# Patient Record
Sex: Female | Born: 1940
Health system: Southern US, Community
[De-identification: ages and names within clinical notes are randomized; demographics above are authoritative.]

## PROBLEM LIST (undated history)

## (undated) DIAGNOSIS — C801 Malignant (primary) neoplasm, unspecified: Secondary | ICD-10-CM

## (undated) DIAGNOSIS — Z923 Personal history of irradiation: Secondary | ICD-10-CM

## (undated) DIAGNOSIS — D649 Anemia, unspecified: Secondary | ICD-10-CM

## (undated) DIAGNOSIS — J45909 Unspecified asthma, uncomplicated: Secondary | ICD-10-CM

## (undated) DIAGNOSIS — I10 Essential (primary) hypertension: Secondary | ICD-10-CM

## (undated) DIAGNOSIS — H409 Unspecified glaucoma: Secondary | ICD-10-CM

## (undated) DIAGNOSIS — M810 Age-related osteoporosis without current pathological fracture: Secondary | ICD-10-CM

## (undated) DIAGNOSIS — M1990 Unspecified osteoarthritis, unspecified site: Secondary | ICD-10-CM

## (undated) HISTORY — PX: NO PAST SURGERIES: SHX2092

## (undated) HISTORY — PX: GLAUCOMA SURGERY: SHX656

## (undated) HISTORY — DX: Anemia, unspecified: D64.9

## (undated) HISTORY — DX: Age-related osteoporosis without current pathological fracture: M81.0

## (undated) HISTORY — DX: Unspecified glaucoma: H40.9

---

## 2003-01-30 ENCOUNTER — Encounter: Admission: RE | Admit: 2003-01-30 | Discharge: 2003-04-30 | Payer: Self-pay | Admitting: Family Medicine

## 2004-09-09 ENCOUNTER — Ambulatory Visit: Payer: Self-pay | Admitting: Pulmonary Disease

## 2014-09-30 ENCOUNTER — Emergency Department (HOSPITAL_COMMUNITY): Payer: Medicare Other

## 2014-09-30 ENCOUNTER — Inpatient Hospital Stay (HOSPITAL_COMMUNITY)
Admission: EM | Admit: 2014-09-30 | Discharge: 2014-10-04 | DRG: 470 | Disposition: A | Discharge status: Skilled Nursing Facility | Payer: Medicare Other | Attending: Internal Medicine | Admitting: Internal Medicine

## 2014-09-30 ENCOUNTER — Encounter (HOSPITAL_COMMUNITY): Payer: Self-pay | Admitting: *Deleted

## 2014-09-30 DIAGNOSIS — Z79899 Other long term (current) drug therapy: Secondary | ICD-10-CM | POA: Diagnosis not present

## 2014-09-30 DIAGNOSIS — Z7901 Long term (current) use of anticoagulants: Secondary | ICD-10-CM

## 2014-09-30 DIAGNOSIS — Z880 Allergy status to penicillin: Secondary | ICD-10-CM

## 2014-09-30 DIAGNOSIS — Z9889 Other specified postprocedural states: Secondary | ICD-10-CM

## 2014-09-30 DIAGNOSIS — J45909 Unspecified asthma, uncomplicated: Secondary | ICD-10-CM | POA: Diagnosis present

## 2014-09-30 DIAGNOSIS — M069 Rheumatoid arthritis, unspecified: Secondary | ICD-10-CM | POA: Diagnosis present

## 2014-09-30 DIAGNOSIS — D649 Anemia, unspecified: Secondary | ICD-10-CM | POA: Diagnosis not present

## 2014-09-30 DIAGNOSIS — W010XXA Fall on same level from slipping, tripping and stumbling without subsequent striking against object, initial encounter: Secondary | ICD-10-CM | POA: Diagnosis present

## 2014-09-30 DIAGNOSIS — D62 Acute posthemorrhagic anemia: Secondary | ICD-10-CM | POA: Diagnosis not present

## 2014-09-30 DIAGNOSIS — Z7952 Long term (current) use of systemic steroids: Secondary | ICD-10-CM | POA: Diagnosis not present

## 2014-09-30 DIAGNOSIS — S72009A Fracture of unspecified part of neck of unspecified femur, initial encounter for closed fracture: Secondary | ICD-10-CM | POA: Diagnosis present

## 2014-09-30 DIAGNOSIS — S72012A Unspecified intracapsular fracture of left femur, initial encounter for closed fracture: Secondary | ICD-10-CM | POA: Diagnosis present

## 2014-09-30 DIAGNOSIS — M25552 Pain in left hip: Secondary | ICD-10-CM | POA: Diagnosis present

## 2014-09-30 DIAGNOSIS — S72002A Fracture of unspecified part of neck of left femur, initial encounter for closed fracture: Secondary | ICD-10-CM | POA: Diagnosis present

## 2014-09-30 DIAGNOSIS — S72012S Unspecified intracapsular fracture of left femur, sequela: Secondary | ICD-10-CM | POA: Diagnosis not present

## 2014-09-30 DIAGNOSIS — Z8249 Family history of ischemic heart disease and other diseases of the circulatory system: Secondary | ICD-10-CM | POA: Diagnosis not present

## 2014-09-30 DIAGNOSIS — D6489 Other specified anemias: Secondary | ICD-10-CM | POA: Diagnosis present

## 2014-09-30 DIAGNOSIS — S72002S Fracture of unspecified part of neck of left femur, sequela: Secondary | ICD-10-CM | POA: Diagnosis not present

## 2014-09-30 DIAGNOSIS — M0579 Rheumatoid arthritis with rheumatoid factor of multiple sites without organ or systems involvement: Secondary | ICD-10-CM | POA: Diagnosis present

## 2014-09-30 HISTORY — DX: Unspecified asthma, uncomplicated: J45.909

## 2014-09-30 HISTORY — DX: Unspecified osteoarthritis, unspecified site: M19.90

## 2014-09-30 LAB — URINALYSIS, ROUTINE W REFLEX MICROSCOPIC
BILIRUBIN URINE: NEGATIVE
Glucose, UA: NEGATIVE mg/dL
HGB URINE DIPSTICK: NEGATIVE
Ketones, ur: NEGATIVE mg/dL
NITRITE: NEGATIVE
Protein, ur: NEGATIVE mg/dL
Specific Gravity, Urine: 1.015 (ref 1.005–1.030)
Urobilinogen, UA: 1 mg/dL (ref 0.0–1.0)
pH: 7 (ref 5.0–8.0)

## 2014-09-30 LAB — I-STAT CHEM 8, ED
BUN: 17 mg/dL (ref 6–20)
Calcium, Ion: 1.16 mmol/L (ref 1.13–1.30)
Chloride: 96 mmol/L — ABNORMAL LOW (ref 101–111)
Creatinine, Ser: 0.7 mg/dL (ref 0.44–1.00)
Glucose, Bld: 108 mg/dL — ABNORMAL HIGH (ref 70–99)
HCT: 37 % (ref 36.0–46.0)
HEMOGLOBIN: 12.6 g/dL (ref 12.0–15.0)
POTASSIUM: 4.3 mmol/L (ref 3.5–5.1)
Sodium: 133 mmol/L — ABNORMAL LOW (ref 135–145)
TCO2: 28 mmol/L (ref 0–100)

## 2014-09-30 LAB — CBC WITH DIFFERENTIAL/PLATELET
BASOS PCT: 0 % (ref 0–1)
Basophils Absolute: 0 10*3/uL (ref 0.0–0.1)
EOS ABS: 0.2 10*3/uL (ref 0.0–0.7)
EOS PCT: 2 % (ref 0–5)
HCT: 34.6 % — ABNORMAL LOW (ref 36.0–46.0)
HEMOGLOBIN: 11.1 g/dL — AB (ref 12.0–15.0)
LYMPHS ABS: 1.7 10*3/uL (ref 0.7–4.0)
Lymphocytes Relative: 19 % (ref 12–46)
MCH: 27.1 pg (ref 26.0–34.0)
MCHC: 32.1 g/dL (ref 30.0–36.0)
MCV: 84.4 fL (ref 78.0–100.0)
Monocytes Absolute: 1.3 10*3/uL — ABNORMAL HIGH (ref 0.1–1.0)
Monocytes Relative: 15 % — ABNORMAL HIGH (ref 3–12)
Neutro Abs: 5.6 10*3/uL (ref 1.7–7.7)
Neutrophils Relative %: 64 % (ref 43–77)
Platelets: 332 10*3/uL (ref 150–400)
RBC: 4.1 MIL/uL (ref 3.87–5.11)
RDW: 14.6 % (ref 11.5–15.5)
WBC: 8.8 10*3/uL (ref 4.0–10.5)

## 2014-09-30 LAB — URINE MICROSCOPIC-ADD ON

## 2014-09-30 MED ORDER — ONDANSETRON HCL 4 MG/2ML IJ SOLN
4.0000 mg | Freq: Once | INTRAMUSCULAR | Status: AC
  Administered 2014-09-30: 4 mg via INTRAVENOUS
  Filled 2014-09-30: qty 2

## 2014-09-30 MED ORDER — MORPHINE SULFATE 4 MG/ML IJ SOLN
4.0000 mg | Freq: Once | INTRAMUSCULAR | Status: AC
  Administered 2014-09-30: 4 mg via INTRAVENOUS
  Filled 2014-09-30: qty 1

## 2014-09-30 MED ORDER — SODIUM CHLORIDE 0.9 % IV SOLN
20.0000 mL | INTRAVENOUS | Status: DC
  Administered 2014-09-30: 20 mL via INTRAVENOUS

## 2014-09-30 NOTE — ED Provider Notes (Signed)
CSN: [id number]     Arrival date & time 09/30/14  1714 History  This chart was scribed for Ripley Fraise, MD by Rayfield Citizen, ED Scribe. This patient was seen in room A09C/A09C and the patient's care was started at 10:59 PM.    Chief Complaint  Patient presents with  . hip fracture     x 1 month   Patient is a 74 y.o. female presenting with hip pain. The history is provided by the patient. No language interpreter was used.  Hip Pain This is a new problem. The current episode started more than 1 week ago. The problem occurs constantly. The problem has not changed since onset.Pertinent negatives include no chest pain and no abdominal pain. The symptoms are aggravated by walking, bending and standing (Use of left leg). Nothing relieves the symptoms. She has tried nothing for the symptoms. The treatment provided no relief.  HPI Comments: Felicia Gonzalez is a 74 y.o. female who presents to the Emergency Department complaining of 1 month of left hip pain after a mechanical fall. Patient was seen by her PCP 5 days after her fall and was told at that time that she was experiencing muscle pain; she was referred to physical therapy. Patient states that after her third visit, her PT suggested she needed an xray. She received an xray today and was sent here following a visit with Dr. Durward Fortes. Patient explains her pain is bearable but makes it difficult for her to sleep at night. She is unable to lift the left leg due to pain. She denies fevers, vomiting, chest pain, abdominal pain, back pain, ankle pain, new or unusual knee pain (arthritic pain at baseline). She denies prior surgical history with this hip.   PCP Dr. Harrington Challenger. She denies anticoagulant use.   Past Medical History  Diagnosis Date  . Arthritis     rheumatoid  . Asthma    History reviewed. No pertinent past surgical history. No family history on file. History  Substance Use Topics  . Smoking status: Never Smoker   . Smokeless tobacco: Not on  file  . Alcohol Use: No   OB History    No data available     Review of Systems  Constitutional: Negative for fever.  Cardiovascular: Negative for chest pain.  Gastrointestinal: Negative for vomiting and abdominal pain.  Musculoskeletal: Negative for back pain.       Left hip pain  All other systems reviewed and are negative.  Allergies  Penicillins  Home Medications   Prior to Admission medications   Not on File   BP 113/65 mmHg  Pulse 87  Temp(Src) 98.4 F (36.9 C) (Oral)  Resp 16  SpO2 100% Physical Exam  Nursing note and vitals reviewed.   CONSTITUTIONAL: Well developed/well nourished HEAD: Normocephalic/atraumatic EYES: EOMI/PERRL ENMT: Mucous membranes moist NECK: supple no meningeal signs SPINE/BACK:entire spine nontender CV: S1/S2 noted, no murmurs/rubs/gallops noted LUNGS: Lungs are clear to auscultation bilaterally, no apparent distress ABDOMEN: soft, nontender, no rebound or guarding, bowel sounds noted throughout abdomen GU:no cva tenderness NEURO: Pt is awake/alert/appropriate, moves all extremitiesx4.  No facial droop.   EXTREMITIES: pulses normal/equal, full ROM; tenderness with ROM of left hip SKIN: warm, color normal PSYCH: no abnormalities of mood noted, alert and oriented to situation  ED Course  Procedures   DIAGNOSTIC STUDIES: Oxygen Saturation is 100% on RA, normal by my interpretation.    COORDINATION OF CARE: 11:03 PM Discussed treatment plan with pt at bedside and pt agreed  to plan. 11:25 PM D/w on call for [facility] He is aware of pt Dr Durward Fortes plans for OR tomorrow Will call [facility] for admission Left hip film from outside clinic reviewed, fracture noted 11:37 PM D/w dr Hal Hope, will admit  Labs Review Labs Reviewed  URINALYSIS, ROUTINE W REFLEX MICROSCOPIC - Abnormal; Notable for the following:    Leukocytes, UA TRACE (*)    All other components within normal limits  URINE MICROSCOPIC-ADD ON - Abnormal; Notable  for the following:    Casts HYALINE CASTS (*)    Crystals CA OXALATE CRYSTALS (*)    All other components within normal limits  CBC WITH DIFFERENTIAL/PLATELET - Abnormal; Notable for the following:    Hemoglobin 11.1 (*)    HCT 34.6 (*)    Monocytes Relative 15 (*)    Monocytes Absolute 1.3 (*)    All other components within normal limits  I-STAT CHEM 8, ED - Abnormal; Notable for the following:    Sodium 133 (*)    Chloride 96 (*)    Glucose, Bld 108 (*)    All other components within normal limits  BASIC METABOLIC PANEL  PROTIME-INR  TYPE AND SCREEN    Imaging Review Dg Chest Port 1 View  09/30/2014   CLINICAL DATA:  Hip fracture  EXAM: PORTABLE CHEST - 1 VIEW  COMPARISON:  None.  FINDINGS: Normal mediastinum and cardiac silhouette. Normal pulmonary vasculature. No evidence of effusion, infiltrate, or pneumothorax. No acute bony abnormality. There is a rounded sclerotic density measuring 3.4 by 2.1 cm projecting over the right first rib. This is not appear to be in the lung parenchyma and may be associated with a rib 4 of the skin.  IMPRESSION: No acute cardiopulmonary process.  Sclerotic lesion projecting over the right lung apex is indeterminate. This may associated with the bone and would potentially be palpable.   Electronically Signed   By: Suzy Bouchard M.D.   On: 09/30/2014 23:47     EKG Interpretation   Date/Time:  Monday Sep 30 2014 23:19:05 EDT Ventricular Rate:  94 PR Interval:  163 QRS Duration: 81 QT Interval:  331 QTC Calculation: 414 R Axis:   58 Text Interpretation:  Sinus rhythm Atrial premature complex Probable  anteroseptal infarct, old artifact noted Confirmed by Christy Gentles  MD, Jacarius Handel  [phone]) on 09/30/2014 11:25:42 PM     Medications  morphine 4 MG/ML injection 4 mg (not administered)  ondansetron (ZOFRAN) injection 4 mg (not administered)  0.9 %  sodium chloride infusion (not administered)    MDM   Final diagnoses:  Closed left hip fracture,  initial encounter    Nursing notes including past medical history and social history reviewed and considered in documentation xrays/imaging reviewed by myself and considered during evaluation Labs/vital reviewed myself and considered during evaluation    I personally performed the services described in this documentation, which was scribed in my presence. The recorded information has been reviewed and is accurate.      Ripley Fraise, MD 09/30/14 2356

## 2014-09-30 NOTE — ED Notes (Signed)
Pt sent here by Dr Donne Hazel for hip fracture x 1 month.  States she tripped and fell 1 month prior.  Went to an MD who told her she had muscle pain and referred her to a physical therapy.  After 3rd visit, PT said pt needed an X-ray.  Pt's pcp sent her to an x-ray today which showed L hip fracture, displaced.  Dr Donne Hazel, orthopedics, sent pt here to be admitted.

## 2014-10-01 ENCOUNTER — Encounter (HOSPITAL_COMMUNITY)
Admission: EM | Disposition: A | Discharge status: Skilled Nursing Facility | Payer: Self-pay | Source: Home / Self Care | Attending: Family Medicine

## 2014-10-01 ENCOUNTER — Inpatient Hospital Stay (HOSPITAL_COMMUNITY): Payer: Medicare Other | Admitting: Certified Registered Nurse Anesthetist

## 2014-10-01 ENCOUNTER — Inpatient Hospital Stay (HOSPITAL_COMMUNITY): Payer: Medicare Other

## 2014-10-01 ENCOUNTER — Encounter (HOSPITAL_COMMUNITY): Payer: Self-pay | Admitting: Internal Medicine

## 2014-10-01 DIAGNOSIS — S72012A Unspecified intracapsular fracture of left femur, initial encounter for closed fracture: Secondary | ICD-10-CM | POA: Diagnosis present

## 2014-10-01 DIAGNOSIS — M069 Rheumatoid arthritis, unspecified: Secondary | ICD-10-CM

## 2014-10-01 DIAGNOSIS — D649 Anemia, unspecified: Secondary | ICD-10-CM | POA: Diagnosis present

## 2014-10-01 DIAGNOSIS — S72002A Fracture of unspecified part of neck of left femur, initial encounter for closed fracture: Secondary | ICD-10-CM

## 2014-10-01 DIAGNOSIS — M0579 Rheumatoid arthritis with rheumatoid factor of multiple sites without organ or systems involvement: Secondary | ICD-10-CM | POA: Diagnosis present

## 2014-10-01 DIAGNOSIS — S72009A Fracture of unspecified part of neck of unspecified femur, initial encounter for closed fracture: Secondary | ICD-10-CM | POA: Diagnosis present

## 2014-10-01 HISTORY — PX: TOTAL HIP ARTHROPLASTY: SHX124

## 2014-10-01 LAB — ABO/RH: ABO/RH(D): B NEG

## 2014-10-01 LAB — CBC
HCT: 32.6 % — ABNORMAL LOW (ref 36.0–46.0)
HEMOGLOBIN: 10.3 g/dL — AB (ref 12.0–15.0)
MCH: 26.3 pg (ref 26.0–34.0)
MCHC: 31.6 g/dL (ref 30.0–36.0)
MCV: 83.4 fL (ref 78.0–100.0)
Platelets: 336 10*3/uL (ref 150–400)
RBC: 3.91 MIL/uL (ref 3.87–5.11)
RDW: 14.6 % (ref 11.5–15.5)
WBC: 6.5 10*3/uL (ref 4.0–10.5)

## 2014-10-01 LAB — BASIC METABOLIC PANEL
Anion gap: 10 (ref 5–15)
BUN: 13 mg/dL (ref 6–20)
CHLORIDE: 97 mmol/L — AB (ref 101–111)
CO2: 27 mmol/L (ref 22–32)
Calcium: 9.1 mg/dL (ref 8.9–10.3)
Creatinine, Ser: 0.67 mg/dL (ref 0.44–1.00)
GFR calc non Af Amer: >60 mL/min (ref >60)
Glucose, Bld: 108 mg/dL — ABNORMAL HIGH (ref 70–99)
POTASSIUM: 4.3 mmol/L (ref 3.5–5.1)
Sodium: 134 mmol/L — ABNORMAL LOW (ref 135–145)

## 2014-10-01 LAB — COMPREHENSIVE METABOLIC PANEL
ALBUMIN: 2.5 g/dL — AB (ref 3.5–5.0)
ALK PHOS: 133 U/L — AB (ref 38–126)
ALT: 21 U/L (ref 14–54)
ANION GAP: 10 (ref 5–15)
AST: 18 U/L (ref 15–41)
BILIRUBIN TOTAL: 0.4 mg/dL (ref 0.3–1.2)
BUN: 8 mg/dL (ref 6–20)
CO2: 26 mmol/L (ref 22–32)
Calcium: 8.9 mg/dL (ref 8.9–10.3)
Chloride: 101 mmol/L (ref 101–111)
Creatinine, Ser: 0.62 mg/dL (ref 0.44–1.00)
GFR calc non Af Amer: >60 mL/min (ref >60)
Glucose, Bld: 114 mg/dL — ABNORMAL HIGH (ref 70–99)
POTASSIUM: 4.2 mmol/L (ref 3.5–5.1)
SODIUM: 137 mmol/L (ref 135–145)
TOTAL PROTEIN: 6 g/dL — AB (ref 6.5–8.1)

## 2014-10-01 LAB — PREPARE RBC (CROSSMATCH)

## 2014-10-01 LAB — PROTIME-INR
INR: 1 (ref 0.00–1.49)
Prothrombin Time: 13.3 seconds (ref 11.6–15.2)

## 2014-10-01 LAB — SURGICAL PCR SCREEN
MRSA, PCR: NEGATIVE
Staphylococcus aureus: NEGATIVE

## 2014-10-01 LAB — GLUCOSE, CAPILLARY: Glucose-Capillary: 106 mg/dL — ABNORMAL HIGH (ref 70–99)

## 2014-10-01 SURGERY — ARTHROPLASTY, HIP, TOTAL,POSTERIOR APPROACH
Anesthesia: General | Laterality: Left

## 2014-10-01 MED ORDER — HYDROCORTISONE NA SUCCINATE PF 100 MG IJ SOLR
INTRAMUSCULAR | Status: DC | PRN
  Administered 2014-10-01: 100 mg via INTRAVENOUS

## 2014-10-01 MED ORDER — FENTANYL CITRATE (PF) 250 MCG/5ML IJ SOLN
INTRAMUSCULAR | Status: AC
  Filled 2014-10-01: qty 5

## 2014-10-01 MED ORDER — ALUM & MAG HYDROXIDE-SIMETH 200-200-20 MG/5ML PO SUSP: 30.0000 mL | ORAL | Status: DC | PRN

## 2014-10-01 MED ORDER — DOCUSATE SODIUM 100 MG PO CAPS
100.0000 mg | ORAL_CAPSULE | Freq: Two times a day (BID) | ORAL | Status: DC
  Administered 2014-10-01 – 2014-10-04 (×6): 100 mg via ORAL
  Filled 2014-10-01 (×6): qty 1

## 2014-10-01 MED ORDER — MIDAZOLAM HCL 5 MG/5ML IJ SOLN
INTRAMUSCULAR | Status: DC | PRN
  Administered 2014-10-01: 2 mg via INTRAVENOUS

## 2014-10-01 MED ORDER — MIDAZOLAM HCL 2 MG/2ML IJ SOLN: 0.5000 mg | Freq: Once | INTRAMUSCULAR | Status: DC | PRN

## 2014-10-01 MED ORDER — HYDROCORTISONE NA SUCCINATE PF 100 MG IJ SOLR
INTRAMUSCULAR | Status: AC
  Filled 2014-10-01: qty 2

## 2014-10-01 MED ORDER — HYDROCORTISONE NA SUCCINATE PF 100 MG IJ SOLR
50.0000 mg | Freq: Four times a day (QID) | INTRAMUSCULAR | Status: AC
  Administered 2014-10-01 – 2014-10-02 (×2): 50 mg via INTRAVENOUS
  Filled 2014-10-01 (×2): qty 1

## 2014-10-01 MED ORDER — ACETAMINOPHEN 10 MG/ML IV SOLN
1000.0000 mg | Freq: Four times a day (QID) | INTRAVENOUS | Status: AC
  Administered 2014-10-01 – 2014-10-02 (×4): 1000 mg via INTRAVENOUS
  Filled 2014-10-01 (×4): qty 100

## 2014-10-01 MED ORDER — BUPIVACAINE-EPINEPHRINE (PF) 0.25% -1:200000 IJ SOLN
INTRAMUSCULAR | Status: AC
  Filled 2014-10-01: qty 30

## 2014-10-01 MED ORDER — MENTHOL 3 MG MT LOZG: 1.0000 | LOZENGE | OROMUCOSAL | Status: DC | PRN

## 2014-10-01 MED ORDER — NEOSTIGMINE METHYLSULFATE 10 MG/10ML IV SOLN
INTRAVENOUS | Status: DC | PRN
  Administered 2014-10-01: 4 mg via INTRAVENOUS

## 2014-10-01 MED ORDER — LACTATED RINGERS IV SOLN
INTRAVENOUS | Status: DC | PRN
  Administered 2014-10-01: 18:00:00 via INTRAVENOUS

## 2014-10-01 MED ORDER — HYDROCODONE-ACETAMINOPHEN 5-325 MG PO TABS
1.0000 | ORAL_TABLET | Freq: Four times a day (QID) | ORAL | Status: DC | PRN
  Administered 2014-10-01: 2 via ORAL
  Filled 2014-10-01 (×4): qty 2

## 2014-10-01 MED ORDER — MEPERIDINE HCL 25 MG/ML IJ SOLN: 6.2500 mg | INTRAMUSCULAR | Status: DC | PRN

## 2014-10-01 MED ORDER — GLYCOPYRROLATE 0.2 MG/ML IJ SOLN
INTRAMUSCULAR | Status: DC | PRN
  Administered 2014-10-01: .6 mg via INTRAVENOUS

## 2014-10-01 MED ORDER — HYDROCORTISONE NA SUCCINATE PF 100 MG IJ SOLR
50.0000 mg | Freq: Three times a day (TID) | INTRAMUSCULAR | Status: DC
  Administered 2014-10-01 (×2): 50 mg via INTRAVENOUS
  Filled 2014-10-01 (×5): qty 1

## 2014-10-01 MED ORDER — HYDROMORPHONE HCL 1 MG/ML IJ SOLN
INTRAMUSCULAR | Status: AC
  Filled 2014-10-01: qty 1

## 2014-10-01 MED ORDER — BISACODYL 10 MG RE SUPP: 10.0000 mg | Freq: Every day | RECTAL | Status: DC | PRN

## 2014-10-01 MED ORDER — RIVAROXABAN 10 MG PO TABS
10.0000 mg | ORAL_TABLET | Freq: Every day | ORAL | Status: DC
  Administered 2014-10-02 – 2014-10-04 (×3): 10 mg via ORAL
  Filled 2014-10-01 (×3): qty 1

## 2014-10-01 MED ORDER — ALBUMIN HUMAN 5 % IV SOLN
INTRAVENOUS | Status: DC | PRN
Start: 2014-10-01 — End: 2014-10-01
  Administered 2014-10-01: 18:00:00 via INTRAVENOUS

## 2014-10-01 MED ORDER — ONDANSETRON HCL 4 MG PO TABS: 4.0000 mg | ORAL_TABLET | Freq: Four times a day (QID) | ORAL | Status: DC | PRN

## 2014-10-01 MED ORDER — SODIUM CHLORIDE 0.9 % IV SOLN
INTRAVENOUS | Status: DC
  Administered 2014-10-01: 16:00:00 via INTRAVENOUS
  Administered 2014-10-01: 1000 mL via INTRAVENOUS

## 2014-10-01 MED ORDER — HYDROMORPHONE HCL 1 MG/ML IJ SOLN
0.2500 mg | INTRAMUSCULAR | Status: DC | PRN
  Administered 2014-10-03 (×2): 0.5 mg via INTRAVENOUS
  Filled 2014-10-01 (×2): qty 1

## 2014-10-01 MED ORDER — HYDROMORPHONE HCL 1 MG/ML IJ SOLN
0.2500 mg | INTRAMUSCULAR | Status: DC | PRN
  Administered 2014-10-01 (×2): 0.5 mg via INTRAVENOUS
  Administered 2014-10-01 (×2): 0.25 mg via INTRAVENOUS

## 2014-10-01 MED ORDER — HYDROCODONE-ACETAMINOPHEN 5-325 MG PO TABS
1.0000 | ORAL_TABLET | ORAL | Status: DC | PRN
  Administered 2014-10-02 (×2): 2 via ORAL
  Administered 2014-10-03: 1 via ORAL
  Administered 2014-10-03: 2 via ORAL
  Filled 2014-10-01 (×2): qty 2

## 2014-10-01 MED ORDER — DIPHENHYDRAMINE HCL 12.5 MG/5ML PO ELIX: 12.5000 mg | ORAL_SOLUTION | ORAL | Status: DC | PRN

## 2014-10-01 MED ORDER — ROCURONIUM BROMIDE 100 MG/10ML IV SOLN
INTRAVENOUS | Status: DC | PRN
  Administered 2014-10-01: 40 mg via INTRAVENOUS

## 2014-10-01 MED ORDER — MORPHINE SULFATE 2 MG/ML IJ SOLN: 0.5000 mg | INTRAMUSCULAR | Status: DC | PRN

## 2014-10-01 MED ORDER — POLYETHYLENE GLYCOL 3350 17 G PO PACK
17.0000 g | PACK | Freq: Every day | ORAL | Status: DC | PRN
  Filled 2014-10-01: qty 1

## 2014-10-01 MED ORDER — PHENOL 1.4 % MT LIQD: 1.0000 | OROMUCOSAL | Status: DC | PRN

## 2014-10-01 MED ORDER — PROPOFOL 10 MG/ML IV BOLUS
INTRAVENOUS | Status: DC | PRN
  Administered 2014-10-01: 80 mg via INTRAVENOUS

## 2014-10-01 MED ORDER — PROMETHAZINE HCL 25 MG/ML IJ SOLN: 6.2500 mg | INTRAMUSCULAR | Status: DC | PRN

## 2014-10-01 MED ORDER — CEFAZOLIN SODIUM-DEXTROSE 2-3 GM-% IV SOLR: 2.0000 g | INTRAVENOUS | Status: DC

## 2014-10-01 MED ORDER — ONDANSETRON HCL 4 MG/2ML IJ SOLN
INTRAMUSCULAR | Status: DC | PRN
  Administered 2014-10-01: 4 mg via INTRAVENOUS

## 2014-10-01 MED ORDER — LACTATED RINGERS IV SOLN: INTRAVENOUS | Status: DC

## 2014-10-01 MED ORDER — CHLORHEXIDINE GLUCONATE 4 % EX LIQD
60.0000 mL | Freq: Once | CUTANEOUS | Status: DC
  Filled 2014-10-01: qty 60

## 2014-10-01 MED ORDER — ONDANSETRON HCL 4 MG/2ML IJ SOLN
4.0000 mg | Freq: Four times a day (QID) | INTRAMUSCULAR | Status: DC | PRN
Start: 2014-10-01 — End: 2014-10-04

## 2014-10-01 MED ORDER — MAGNESIUM CITRATE PO SOLN: 1.0000 | Freq: Once | ORAL | Status: AC | PRN

## 2014-10-01 MED ORDER — SODIUM CHLORIDE 0.9 % IV SOLN: Freq: Once | INTRAVENOUS | Status: DC

## 2014-10-01 MED ORDER — METOCLOPRAMIDE HCL 5 MG PO TABS: 5.0000 mg | ORAL_TABLET | Freq: Three times a day (TID) | ORAL | Status: DC | PRN

## 2014-10-01 MED ORDER — METOCLOPRAMIDE HCL 5 MG/ML IJ SOLN: 5.0000 mg | Freq: Three times a day (TID) | INTRAMUSCULAR | Status: DC | PRN

## 2014-10-01 MED ORDER — VANCOMYCIN HCL 1000 MG IV SOLR
1000.0000 mg | INTRAVENOUS | Status: DC | PRN
  Administered 2014-10-01: 1000 mg via INTRAVENOUS

## 2014-10-01 MED ORDER — FENTANYL CITRATE (PF) 100 MCG/2ML IJ SOLN
INTRAMUSCULAR | Status: DC | PRN
  Administered 2014-10-01: 50 ug via INTRAVENOUS
  Administered 2014-10-01: 100 ug via INTRAVENOUS
  Administered 2014-10-01 (×6): 50 ug via INTRAVENOUS

## 2014-10-01 MED ORDER — VANCOMYCIN HCL IN DEXTROSE 1-5 GM/200ML-% IV SOLN
1000.0000 mg | Freq: Once | INTRAVENOUS | Status: DC
  Filled 2014-10-01 (×2): qty 200

## 2014-10-01 MED ORDER — CEFAZOLIN SODIUM-DEXTROSE 2-3 GM-% IV SOLR
INTRAVENOUS | Status: AC
  Filled 2014-10-01: qty 50

## 2014-10-01 MED ORDER — LIDOCAINE HCL (CARDIAC) 20 MG/ML IV SOLN
INTRAVENOUS | Status: DC | PRN
  Administered 2014-10-01: 20 mg via INTRAVENOUS

## 2014-10-01 MED ORDER — BUPIVACAINE-EPINEPHRINE (PF) 0.25% -1:200000 IJ SOLN
INTRAMUSCULAR | Status: DC | PRN
  Administered 2014-10-01: 20 mL via PERINEURAL

## 2014-10-01 MED ORDER — PHENYLEPHRINE HCL 10 MG/ML IJ SOLN
INTRAMUSCULAR | Status: DC | PRN
  Administered 2014-10-01 (×4): 80 ug via INTRAVENOUS

## 2014-10-01 MED ORDER — SODIUM CHLORIDE 0.9 % IR SOLN
Status: DC | PRN
  Administered 2014-10-01 (×2): 1000 mL

## 2014-10-01 SURGICAL SUPPLY — 55 items
BLADE SAW SAG 73X25 THK (BLADE) ×1
BLADE SAW SGTL 73X25 THK (BLADE) ×1 IMPLANT
CAPT HIP TOTAL 2 ×2 IMPLANT
COVER SURGICAL LIGHT HANDLE (MISCELLANEOUS) ×2 IMPLANT
DECANTER SPIKE VIAL GLASS SM (MISCELLANEOUS) ×2 IMPLANT
DRAPE ORTHO SPLIT 77X108 STRL (DRAPES) ×2
DRAPE SURG ORHT 6 SPLT 77X108 (DRAPES) ×2 IMPLANT
DRSG MEPILEX BORDER 4X12 (GAUZE/BANDAGES/DRESSINGS) ×2 IMPLANT
DRSG MEPILEX BORDER 4X8 (GAUZE/BANDAGES/DRESSINGS) ×2 IMPLANT
DURAPREP 26ML APPLICATOR (WOUND CARE) ×4 IMPLANT
ELECT BLADE 6.5 EXT (BLADE) IMPLANT
ELECT REM PT RETURN 9FT ADLT (ELECTROSURGICAL) ×2
ELECTRODE REM PT RTRN 9FT ADLT (ELECTROSURGICAL) ×1 IMPLANT
EVACUATOR 1/8 PVC DRAIN (DRAIN) IMPLANT
FACESHIELD WRAPAROUND (MASK) ×6 IMPLANT
GLOVE BIO SURGEON STRL SZ 6.5 (GLOVE) ×2 IMPLANT
GLOVE BIOGEL PI IND STRL 7.0 (GLOVE) ×3 IMPLANT
GLOVE BIOGEL PI IND STRL 8 (GLOVE) ×2 IMPLANT
GLOVE BIOGEL PI IND STRL 8.5 (GLOVE) ×1 IMPLANT
GLOVE BIOGEL PI INDICATOR 7.0 (GLOVE) ×3
GLOVE BIOGEL PI INDICATOR 8 (GLOVE) ×2
GLOVE BIOGEL PI INDICATOR 8.5 (GLOVE) ×1
GLOVE ECLIPSE 8.0 STRL XLNG CF (GLOVE) ×4 IMPLANT
GLOVE SURG ORTHO 8.5 STRL (GLOVE) ×4 IMPLANT
GOWN STRL REUS W/ TWL LRG LVL3 (GOWN DISPOSABLE) ×2 IMPLANT
GOWN STRL REUS W/TWL 2XL LVL3 (GOWN DISPOSABLE) ×4 IMPLANT
GOWN STRL REUS W/TWL LRG LVL3 (GOWN DISPOSABLE) ×2
IMMOBILIZER KNEE 20 (SOFTGOODS) IMPLANT
IMMOBILIZER KNEE 22 (SOFTGOODS) ×2 IMPLANT
IMMOBILIZER KNEE 22 UNIV (SOFTGOODS) ×2 IMPLANT
KIT BASIN OR (CUSTOM PROCEDURE TRAY) ×2 IMPLANT
KIT ROOM TURNOVER OR (KITS) ×2 IMPLANT
MANIFOLD NEPTUNE II (INSTRUMENTS) ×6 IMPLANT
NEEDLE 22X1 1/2 (OR ONLY) (NEEDLE) ×2 IMPLANT
NS IRRIG 1000ML POUR BTL (IV SOLUTION) ×2 IMPLANT
PACK TOTAL JOINT (CUSTOM PROCEDURE TRAY) ×2 IMPLANT
PACK UNIVERSAL I (CUSTOM PROCEDURE TRAY) ×2 IMPLANT
PAD ARMBOARD 7.5X6 YLW CONV (MISCELLANEOUS) ×2 IMPLANT
STAPLER VISISTAT 35W (STAPLE) IMPLANT
SUCTION FRAZIER TIP 10 FR DISP (SUCTIONS) ×2 IMPLANT
SUT BONE WAX W31G (SUTURE) IMPLANT
SUT ETHIBOND NAB CT1 #1 30IN (SUTURE) ×6 IMPLANT
SUT MNCRL AB 3-0 PS2 18 (SUTURE) ×2 IMPLANT
SUT VIC AB 0 CT1 27 (SUTURE) ×2
SUT VIC AB 0 CT1 27XBRD ANBCTR (SUTURE) ×2 IMPLANT
SUT VIC AB 1 CT1 27 (SUTURE) ×2
SUT VIC AB 1 CT1 27XBRD ANBCTR (SUTURE) ×2 IMPLANT
SUT VIC AB 2-0 CT1 27 (SUTURE) ×1
SUT VIC AB 2-0 CT1 TAPERPNT 27 (SUTURE) ×1 IMPLANT
SYR CONTROL 10ML LL (SYRINGE) ×2 IMPLANT
TOWEL OR 17X24 6PK STRL BLUE (TOWEL DISPOSABLE) ×2 IMPLANT
TOWEL OR 17X26 10 PK STRL BLUE (TOWEL DISPOSABLE) ×2 IMPLANT
TRAY FOLEY CATH 14FR (SET/KITS/TRAYS/PACK) ×2 IMPLANT
WATER STERILE IRR 1000ML POUR (IV SOLUTION) ×8 IMPLANT
YANKAUER SUCT BULB TIP NO VENT (SUCTIONS) ×2 IMPLANT

## 2014-10-01 NOTE — Op Note (Signed)
PATIENT ID:      Felicia Gonzalez  MRN:     [id number] DOB/AGE:    1940-06-21 / 74 y.o.       OPERATIVE REPORT    DATE OF PROCEDURE:  10/01/2014       PREOPERATIVE DIAGNOSIS:   CHRONIC DISPLACED SUBCAPITAL FRACTURE LEFT HIP                                                       Estimated body mass index is 27.92 kg/(m^2) as calculated from the following:   Height as of this encounter: 5\' 1"  (1.549 m).   Weight as of this encounter: 67 kg (147 lb 11.3 oz).     POSTOPERATIVE DIAGNOSIS:   SAME                                                                   Estimated body mass index is 27.92 kg/(m^2) as calculated from the following:   Height as of this encounter: 5\' 1"  (1.549 m).   Weight as of this encounter: 67 kg (147 lb 11.3 oz).     PROCEDURE:  Procedure(s):LEFT TOTAL HIP ARTHROPLASTY      SURGEON:  Joni Fears, MD    ASSISTANT:   Biagio Borg, PA-C   (Present and scrubbed throughout the case, critical for assistance with exposure, retraction, instrumentation, and closure.)          ANESTHESIA: general     DRAINS: none :      TOURNIQUET TIME: * No tourniquets in log *    COMPLICATIONS:  None   CONDITION:  stable  PROCEDURE IN [id number]   Joni Fears W 10/01/2014, 6:31 PM

## 2014-10-01 NOTE — Progress Notes (Signed)
Patient seen earlier this a.m. by my associate. Presenting with left hip fracture and orthopedic surgery consulted.  Patient in no acute distress alert and awake.  We'll plan on reassessing next a.m. unless acute medical condition arises  Felicia Gonzalez, [employer]

## 2014-10-01 NOTE — Anesthesia Postprocedure Evaluation (Signed)
  Anesthesia Post-op Note  Patient: Felicia Gonzalez Interrante  Procedure(s) Performed: Procedure(s): TOTAL HIP ARTHROPLASTY (Left)  Patient Location: PACU  Anesthesia Type:General  Level of Consciousness: awake, alert  and sedated  Airway and Oxygen Therapy: Patient Spontanous Breathing  Post-op Pain: mild  Post-op Assessment: Post-op Vital signs reviewed  Post-op Vital Signs: stable  Last Vitals:  Filed Vitals:   10/01/14 2000  BP: 141/65  Pulse: 91  Temp:   Resp: 13    Complications: No apparent anesthesia complications

## 2014-10-01 NOTE — Progress Notes (Signed)
Initial Nutrition Assessment  DOCUMENTATION CODES:  Not applicable  INTERVENTION:  Other (Comment) (RD to follow for diet advancement)  NUTRITION DIAGNOSIS:  Inadequate oral intake related to inability to eat as evidenced by NPO status.   GOAL:  Patient will meet greater than or equal to 90% of their needs   MONITOR:  PO intake, Diet advancement, Labs, Weight trends, I & O's, Skin  REASON FOR ASSESSMENT:  Consult Hip fracture protocol  ASSESSMENT: Felicia Gonzalez is a 74 y.o. female history of rheumatoid arthritis was referred to the ER after patient was found to have left hip fracture by Dr. Durward Fortes orthopedic surgeon. Patient states she fell a month ago and since then he has been having pain on her left hip. This has been gradually worsening to the point she was unable to sleep. Today she had gone to her orthopedic surgeon and x-rays done at the office showed hip fracture and was referred to the ER. Patient has been admitted for surgery. Patient states she fell when she was trying to walk to the bathroom and slipped and fell. Did not hit her head or lose consciousness. At this time patient denies any chest pain or shortness of breath or palpitations.  Pt reports good appetite currently and PTA. She is anticipating surgery later on today.  She reports UBW of 130#. She reveals that she has gained wt due to not being as active as she should due to arthritic pain and injury. She is very active at baseline.  Pt reports she follows a vegetarian diet at home. Denies any changes in eating habits. Discussed examples of high protein foods for her to consume that are conducive to a vegetarian eating plan. Pt and family express appreciation for visit but deny any further needs at this time.  Labs reviewed. Glucose: 113, CBGS: 106.   Height:  Ht Readings from Last 1 Encounters:  10/01/14 5\' 1"  (1.549 m)    Weight:  Wt Readings from Last 1 Encounters:  10/01/14 147 lb 11.3 oz (67  kg)    Ideal Body Weight:  47.7 kg  Wt Readings from Last 10 Encounters:  10/01/14 147 lb 11.3 oz (67 kg)    BMI:  Body mass index is 27.92 kg/(m^2).  Estimated Nutritional Needs:  Kcal:  1650-1850  Protein:  70-80 grams  Fluid:  1.7-1.9 L  Skin:  Reviewed, no issues  Diet Order:  Diet NPO time specified  EDUCATION NEEDS:  Education needs addressed   Intake/Output Summary (Last 24 hours) at 10/01/14 1146 Last data filed at 10/01/14 0500  Gross per 24 hour  Intake      0 ml  Output      1 ml  Net     -1 ml    Last BM:  09/30/14  Felicia Gonzalez A. Jimmye Norman, RD, LDN, CDE Pager: [phone] After hours Pager: [phone]

## 2014-10-01 NOTE — H&P (Signed)
[facility] History and Physical  Felicia Gonzalez KDX:[id number] DOB: [birth year] DOA: 09/30/2014  Referring physician: Dr. Christy Gentles. PCP: Harle Battiest, MD  Specialists: Dr. Estanislado Pandy. Rheumatologist.  Chief Complaint: Left hip pain.  HPI: Felicia Gonzalez is a 74 y.o. female history of rheumatoid arthritis was referred to the ER after patient was found to have left hip fracture by Dr. Durward Fortes orthopedic surgeon. Patient states she fell a month ago and since then he has been having pain on her left hip. This has been gradually worsening to the point she was unable to sleep. Today she had gone to her orthopedic surgeon and x-rays done at the office showed hip fracture and was referred to the ER. Patient has been admitted for surgery. Patient states she fell when she was trying to walk to the bathroom and slipped and fell. Did not hit her head or lose consciousness. At this time patient denies any chest pain or shortness of breath or palpitations.  Review of Systems: As presented in the history of presenting illness, rest negative.  Past Medical History  Diagnosis Date  . Arthritis     rheumatoid  . Asthma    Past Surgical History  Procedure Laterality Date  . No past surgeries     Social History:  reports that she has never smoked. She does not have any smokeless tobacco history on file. She reports that she does not drink alcohol or use illicit drugs. Where does patient live home. Can patient participate in ADLs? Yes.  Allergies  Allergen Reactions  . Penicillins Other (See Comments)    unknown    Family History:  Family History  Problem Relation Age of Onset  . Hypertension Father       Prior to Admission medications   Medication Sig Start Date End Date Taking? Authorizing Provider  Multiple Vitamin (MULTI-VITAMIN PO) Take 1 tablet by mouth daily.   Yes Historical Provider, MD  predniSONE (DELTASONE) 10 MG tablet Take 10-30 mg by mouth daily. 09/09/14  Yes Historical  Provider, MD  traMADol (ULTRAM) 50 MG tablet Take 50 mg by mouth 4 (four) times daily as needed. 09/09/14  Yes Historical Provider, MD    Physical Exam: Filed Vitals:   09/30/14 2330 09/30/14 2345 09/30/14 2352 10/01/14 0037  BP: 154/86 148/81  145/71  Pulse: 89 94  95  Temp:   98.5 F (36.9 C) 98 F (36.7 C)  TempSrc:   Oral Oral  Resp: 11 18  17   SpO2: 99% 99%  100%     General:  Moderately built and nourished.  Eyes: Anicteric no pallor.  ENT: No discharge from the ears eyes nose or mouth.  Neck: No mass felt. No neck rigidity.  Cardiovascular: S1 and S2 heard.  Respiratory: No rhonchi or crepitations.  Abdomen: Soft nontender bowel sounds present.  Skin: No rash.  Musculoskeletal: Pain on moving left hip.  Psychiatric: Appears normal.  Neurologic: Alert awake oriented to time place and person. Moves all extremities.  Labs on Admission:  Basic Metabolic Panel:  Recent Labs Lab 09/30/14 2325 09/30/14 2337  NA 134* 133*  K 4.3 4.3  CL 97* 96*  CO2 27  --   GLUCOSE 108* 108*  BUN 13 17  CREATININE 0.67 0.70  CALCIUM 9.1  --    Liver Function Tests: No results for input(s): AST, ALT, ALKPHOS, BILITOT, PROT, ALBUMIN in the last 168 hours. No results for input(s): LIPASE, AMYLASE in the last 168 hours. No results for input(s): AMMONIA  in the last 168 hours. CBC:  Recent Labs Lab 09/30/14 2325 09/30/14 2337  WBC 8.8  --   NEUTROABS 5.6  --   HGB 11.1* 12.6  HCT 34.6* 37.0  MCV 84.4  --   PLT 332  --    Cardiac Enzymes: No results for input(s): CKTOTAL, CKMB, CKMBINDEX, TROPONINI in the last 168 hours.  BNP (last 3 results) No results for input(s): BNP in the last 8760 hours.  ProBNP (last 3 results) No results for input(s): PROBNP in the last 8760 hours.  CBG: No results for input(s): GLUCAP in the last 168 hours.  Radiological Exams on Admission: Dg Chest Port 1 View  09/30/2014   CLINICAL DATA:  Hip fracture  EXAM: PORTABLE CHEST - 1  VIEW  COMPARISON:  None.  FINDINGS: Normal mediastinum and cardiac silhouette. Normal pulmonary vasculature. No evidence of effusion, infiltrate, or pneumothorax. No acute bony abnormality. There is a rounded sclerotic density measuring 3.4 by 2.1 cm projecting over the right first rib. This is not appear to be in the lung parenchyma and may be associated with a rib 4 of the skin.  IMPRESSION: No acute cardiopulmonary process.  Sclerotic lesion projecting over the right lung apex is indeterminate. This may associated with the bone and would potentially be palpable.   Electronically Signed   By: Suzy Bouchard M.D.   On: 09/30/2014 23:47    EKG: Independently reviewed. Normal sinus rhythm with atrial premature complexes.  Assessment/Plan Principal Problem:   Closed left hip fracture Active Problems:   Normocytic anemia   Rheumatoid arthritis   Hip fracture   1. Left hip fracture - at this time patient will be kept nothing by mouth in anticipation of surgery. Patient has low risk for intermediate risk procedure. Further recommendations per orthopedic surgery. Patient will placed on pain relief medication. 2. History of rheumatoid arthritis - patient has been placed on stress dose steroids. Patient states she usually takes prednisone 10 mg daily and has had recent exacerbation for which patient has been increased dose on a steroid taper. Restart prednisone orally after surgery once hemodynamically stable. 3. Normocytic normochromic anemia - probably secondary to rheumatoid arthritis. Follow CBC closely.   DVT Prophylaxis SCDs.  Code Status: Full code.  Family Communication: Patient's husband at the bedside.  Disposition Plan: Admit to inpatient.    Charlie Seda N. [facility] Pager [phone].  If 7PM-7AM, please contact night-coverage [email or web address] Password [facility] 10/01/2014, 12:38 AM

## 2014-10-01 NOTE — H&P (Signed)
Joni Fears, MD   Biagio Borg, PA-C [address], [place], [place]  [address]                             [phone]   [place]            MRN:  [id number] DOB/SEX:  1940/10/15/female    REQUESTING PHYSICIAN:whitfield    CHIEF COMPLAINT:  Painful left hip  HISTORY: Cheri Ayotte Baggais a 74 y.o. female with known history of RA presently treated with predisone. She relates having a fall approx 3-4 weeks ago with subsequent onset left hip pain. Initially it was thought by her primary care physician that she was experiencing a RA flare and increased her prednisone dosage. She contacted Dr Estanislado Pandy yesterday with persistent pain with films demonstrating a displaced sub capital fx of the left hip. Admitted for pre op evaluation with planned procedure a THR. I discussed in detail the above with Mr and Mrs Lepage in the office yesterday.   PAST MEDICAL HISTORY: Patient Active Problem List   Diagnosis Date Noted  . Normocytic anemia 10/01/2014  . Rheumatoid arthritis 10/01/2014  . Hip fracture 10/01/2014  . Closed left hip fracture 09/30/2014   Past Medical History  Diagnosis Date  . Arthritis     rheumatoid  . Asthma    Past Surgical History  Procedure Laterality Date  . No past surgeries      MEDICATIONS:   Current facility-administered medications:  .  0.9 %  sodium chloride infusion, , Intravenous, Continuous, Rise Patience, MD, Last Rate: 10 mL/hr at 10/01/14 0106, 1,000 mL at 10/01/14 0106 .  HYDROcodone-acetaminophen (NORCO/VICODIN) 5-325 MG per tablet 1-2 tablet, 1-2 tablet, Oral, Q6H PRN, Rise Patience, MD, 2 tablet at 10/01/14 1217 .  hydrocortisone sodium succinate (SOLU-CORTEF) 100 MG injection 50 mg, 50 mg, Intravenous, Q8H, Rise Patience, MD, 50 mg at 10/01/14 1000 .  morphine 2 MG/ML injection 0.5 mg, 0.5 mg, Intravenous, Q2H PRN, Rise Patience, MD  ALLERGIES:   Allergies  Allergen Reactions  .  Penicillins Other (See Comments)    unknown    REVIEW OF SYSTEMS: REVIEWED IN DETAIL IN CHART  FAMILY HISTORY:   Family History  Problem Relation Age of Onset  . Hypertension Father     SOCIAL HISTORY:   reports that she has never smoked. She does not have any smokeless tobacco history on file. She reports that she does not drink alcohol or use illicit drugs.   EXAMINATION: Vital signs in last 24 hours: Temp:  [98 F (36.7 C)-98.6 F (37 C)] 98.6 F (37 C) (05/03 1300) Pulse Rate:  [74-107] 88 (05/03 1300) Resp:  [11-20] 16 (05/03 1300) BP: (113-174)/(65-95) 141/69 mmHg (05/03 1300) SpO2:  [95 %-100 %] 100 % (05/03 1300) Weight:  [67 kg (147 lb 11.3 oz)] 67 kg (147 lb 11.3 oz) (05/03 0400)  Left LE is shortened and externally rotated. Mild edema of leg with +1 pulses. Neuro appears intact     DIAGNOSTIC STUDIES: Recent laboratory studies:  Recent Labs  09/30/14 2325 09/30/14 2337 10/01/14 0555  WBC 8.8  --  6.5  HGB 11.1* 12.6 10.3*  HCT 34.6* 37.0 32.6*  PLT 332  --  336    Recent Labs  09/30/14 2325 09/30/14 2337 10/01/14 0555  NA 134* 133* 137  K 4.3 4.3 4.2  CL 97* 96* 101  CO2 27  --  26  BUN 13 17 8   CREATININE 0.67 0.70 0.62  GLUCOSE 108* 108* 114*  CALCIUM 9.1  --  8.9   Lab Results  Component Value Date   INR 1.00 10/12/2014     Recent Radiographic Studies :  Dg Chest Port 1 View  2014-10-12   CLINICAL DATA:  Hip fracture  EXAM: PORTABLE CHEST - 1 VIEW  COMPARISON:  None.  FINDINGS: Normal mediastinum and cardiac silhouette. Normal pulmonary vasculature. No evidence of effusion, infiltrate, or pneumothorax. No acute bony abnormality. There is a rounded sclerotic density measuring 3.4 by 2.1 cm projecting over the right first rib. This is not appear to be in the lung parenchyma and may be associated with a rib 4 of the skin.  IMPRESSION: No acute cardiopulmonary process.  Sclerotic lesion projecting over the right lung apex is indeterminate.  This may associated with the bone and would potentially be palpable.   Electronically Signed   By: Suzy Bouchard M.D.   On: Oct 12, 2014 23:47    ASSESSMENT: displaced sub capital fx left hip at least 83-56 weeks old in pt with RA  PLAN: THR WHITFIELD, PETER W 10/01/2014, 1:36 PM

## 2014-10-01 NOTE — H&P (Signed)
  The recent History & Physical has been reviewed. I have personally examined the patient today. There is no interval change to the documented History & Physical. The patient would like to proceed with the procedure.  Joni Fears W 10/01/2014,  4:03 PM

## 2014-10-01 NOTE — Progress Notes (Signed)
Patient de-sats when she falls into a deep sleep, put her on 2 litre's O2 with nasal canula. Patient may have issue with sleep apnea. Will continue to monitor.

## 2014-10-01 NOTE — Anesthesia Procedure Notes (Signed)
Procedure Name: Intubation Date/Time: 10/01/2014 4:29 PM Performed by: Clearnce Sorrel Pre-anesthesia Checklist: Patient identified, Timeout performed, Emergency Drugs available, Suction available and Patient being monitored Patient Re-evaluated:Patient Re-evaluated prior to inductionOxygen Delivery Method: Circle system utilized Preoxygenation: Pre-oxygenation with 100% oxygen Intubation Type: IV induction Ventilation: Mask ventilation without difficulty Laryngoscope Size: Mac and 3 Grade View: Grade IV Tube type: Oral Number of attempts: 1 Airway Equipment and Method: Bougie stylet Placement Confirmation: positive ETCO2 and breath sounds checked- equal and bilateral Secured at: 23 cm Tube secured with: Tape Dental Injury: Teeth and Oropharynx as per pre-operative assessment

## 2014-10-01 NOTE — Anesthesia Preprocedure Evaluation (Addendum)
Anesthesia Evaluation  Patient identified by MRN, date of birth, ID band Patient awake    Reviewed: Allergy & Precautions, NPO status , Patient's Chart, lab work & pertinent test results  History of Anesthesia Complications Negative for: history of anesthetic complications  Airway Mallampati: II  TM Distance: >3 FB Neck ROM: Full    Dental  (+) Teeth Intact, Dental Advisory Given   Pulmonary asthma (no inhaler use in over a month) ,  breath sounds clear to auscultation        Cardiovascular negative cardio ROS  Rhythm:Regular Rate:Normal     Neuro/Psych negative neurological ROS     GI/Hepatic negative GI ROS, Neg liver ROS,   Endo/Other  negative endocrine ROS  Renal/GU negative Renal ROS     Musculoskeletal  (+) Arthritis -, Rheumatoid disorders and on steriods ,    Abdominal   Peds  Hematology negative hematology ROS (+)   Anesthesia Other Findings   Reproductive/Obstetrics                          Anesthesia Physical Anesthesia Plan  ASA: II  Anesthesia Plan: General   Post-op Pain Management:    Induction: Intravenous  Airway Management Planned: Oral ETT  Additional Equipment:   Intra-op Plan:   Post-operative Plan: Extubation in OR  Informed Consent:   Dental advisory given  Plan Discussed with: Anesthesiologist, Surgeon and CRNA  Anesthesia Plan Comments: (Plan routine monitors, GETA)       Anesthesia Quick Evaluation

## 2014-10-01 NOTE — Transfer of Care (Signed)
Immediate Anesthesia Transfer of Care Note  Patient: Felicia Gonzalez  Procedure(s) Performed: Procedure(s): TOTAL HIP ARTHROPLASTY (Left)  Patient Location: PACU  Anesthesia Type:General  Level of Consciousness: awake, alert  and oriented  Airway & Oxygen Therapy: Patient Spontanous Breathing  Post-op Assessment: Report given to RN and Post -op Vital signs reviewed and stable  Post vital signs: Reviewed and stable  Last Vitals:  Filed Vitals:   10/01/14 1517  BP: 135/69  Pulse: 84  Temp: 36.8 C  Resp: 18    Complications: No apparent anesthesia complications

## 2014-10-01 NOTE — Progress Notes (Signed)
Patient arrived around 0100 she was alert and oriented pain was under control, she is resting comfortable will continue to monitor.

## 2014-10-02 ENCOUNTER — Encounter (HOSPITAL_COMMUNITY): Payer: Self-pay | Admitting: Orthopaedic Surgery

## 2014-10-02 DIAGNOSIS — S72002S Fracture of unspecified part of neck of left femur, sequela: Secondary | ICD-10-CM

## 2014-10-02 LAB — BASIC METABOLIC PANEL
Anion gap: 8 (ref 5–15)
BUN: 10 mg/dL (ref 6–20)
CALCIUM: 8.5 mg/dL — AB (ref 8.9–10.3)
CHLORIDE: 100 mmol/L — AB (ref 101–111)
CO2: 27 mmol/L (ref 22–32)
CREATININE: 0.59 mg/dL (ref 0.44–1.00)
GLUCOSE: 130 mg/dL — AB (ref 70–99)
Potassium: 4.1 mmol/L (ref 3.5–5.1)
SODIUM: 135 mmol/L (ref 135–145)

## 2014-10-02 LAB — POCT I-STAT EG7
ACID-BASE EXCESS: 4 mmol/L — AB (ref 0.0–2.0)
Bicarbonate: 27.2 mEq/L — ABNORMAL HIGH (ref 20.0–24.0)
CALCIUM ION: 1.11 mmol/L — AB (ref 1.13–1.30)
HEMATOCRIT: 26 % — AB (ref 36.0–46.0)
Hemoglobin: 8.8 g/dL — ABNORMAL LOW (ref 12.0–15.0)
O2 Saturation: 97 %
POTASSIUM: 4.6 mmol/L (ref 3.5–5.1)
Patient temperature: 35.3
SODIUM: 134 mmol/L — AB (ref 135–145)
TCO2: 28 mmol/L (ref 0–100)
pCO2, Ven: 33.3 mmHg — ABNORMAL LOW (ref 45.0–50.0)
pH, Ven: 7.514 — ABNORMAL HIGH (ref 7.250–7.300)
pO2, Ven: 78 mmHg — ABNORMAL HIGH (ref 30.0–45.0)

## 2014-10-02 LAB — TYPE AND SCREEN
ABO/RH(D): B NEG
Antibody Screen: NEGATIVE
Unit division: 0

## 2014-10-02 LAB — CBC
HCT: 28.5 % — ABNORMAL LOW (ref 36.0–46.0)
HEMOGLOBIN: 9.5 g/dL — AB (ref 12.0–15.0)
MCH: 28 pg (ref 26.0–34.0)
MCHC: 33.3 g/dL (ref 30.0–36.0)
MCV: 84.1 fL (ref 78.0–100.0)
Platelets: 256 10*3/uL (ref 150–400)
RBC: 3.39 MIL/uL — AB (ref 3.87–5.11)
RDW: 14.1 % (ref 11.5–15.5)
WBC: 11.6 10*3/uL — AB (ref 4.0–10.5)

## 2014-10-02 LAB — URINE CULTURE

## 2014-10-02 LAB — VITAMIN D 25 HYDROXY (VIT D DEFICIENCY, FRACTURES): Vit D, 25-Hydroxy: 45.2 ng/mL (ref 30.0–100.0)

## 2014-10-02 LAB — GLUCOSE, CAPILLARY
GLUCOSE-CAPILLARY: 178 mg/dL — AB (ref 70–99)
Glucose-Capillary: 135 mg/dL — ABNORMAL HIGH (ref 70–99)
Glucose-Capillary: 195 mg/dL — ABNORMAL HIGH (ref 70–99)

## 2014-10-02 NOTE — Clinical Social Work Note (Signed)
Clinical Social Work Assessment  Patient Details  Name: Felicia Gonzalez MRN: [id number] Date of Birth: April 27, 1941  Date of referral:  10/02/14               Reason for consult:  Discharge Planning, Facility Placement                Permission sought to share information with:  Family Supports Permission granted to share information::  Yes, Verbal Permission Granted  Name::        Agency::     Relationship::  Patient's daughter (present at bedside)  Contact Information:     Housing/Transportation Living arrangements for the past 2 months:  Single Family Home Source of Information:  Adult Children Patient Interpreter Needed:  None Criminal Activity/Legal Involvement Pertinent to Current Situation/Hospitalization:  No - Comment as needed Significant Relationships:  Adult Children Lives with:  Self Do you feel safe going back to the place where you live?  Yes Need for family participation in patient care:  Yes (Comment) (Patient's daughter active in patient's care.)  Care giving concerns:  Per patient's daughter, patient's daughter lives in [place] and will be commuting while patient in recovery. Patient's daughter expressed concern for patient's discharge disposition at time of discharge.   Social Worker assessment / plan:  CSW informed patient's daughter of PT recommendation of SNF placement at time of discharge. Patient's daughter stated family would prefer for patient to discharge to [facility] at time of discharge. CSW to initiate SNF referral process and continue to follow and assist with discharge planning needs.  Employment status:  Retired Nurse, adult PT Recommendations:  [place] / Referral to community resources:  [place]  Patient/Family's Response to care:  Patient's daughter understanding and agreeable to CSW plan of care.  Patient/Family's Understanding of and Emotional Response to Diagnosis,  Current Treatment, and Prognosis:  Patient's daughter understanding and agreeable to CSW plan of care.  Emotional Assessment Appearance:  Appears stated age Attitude/Demeanor/Rapport:  Other (CSW spoke with patient's daughter outside of patient's room.) Affect (typically observed):  Other (CSW spoke with patient's daughter outside of patient's room.) Orientation:  Oriented to Self, Oriented to Place, Oriented to  Time, Oriented to Situation Alcohol / Substance use:  Not Applicable Psych involvement (Current and /or in the community):  No (Comment) (Not appropriate on this admission.)  Discharge Needs  Concerns to be addressed:  No discharge needs identified Readmission within the last 30 days:  No Current discharge risk:  None Barriers to Discharge:  No Barriers Identified   Caroline Sauger, LCSW 10/02/2014, 2:48 PM [phone]

## 2014-10-02 NOTE — Care Management Note (Addendum)
Case Management Note  Patient Details  Name: CHANIAH CISSE MRN: [id number] Date of Birth: 1940/11/05  Subjective/Objective:                    Action/Plan:   Expected Discharge Date:  10/03/14               Expected Discharge Plan:  Skilled Nursing Facility  In-House Referral:  Clinical Social Work  Discharge planning Services  CM Consult  Post Acute Care Choice:    Choice offered to:  Patient  DME Arranged:    DME Agency:     HH Arranged:  [place] Agency:     Status of Service:  Completed, signed off  Medicare Important Message Given:  Yes Date Medicare IM Given:  10/02/14 Medicare IM give by:  [id number] Date Additional Medicare IM Given:    Additional Medicare Important Message give by:     If discussed at [place] of Stay Meetings, dates discussed:    Additional Comments:  Ninfa Meeker, RN 10/02/2014, 1:42 PM   Carolanne Grumbling - patient's daughter- [phone]

## 2014-10-02 NOTE — Discharge Instructions (Signed)
Information on my medicine - XARELTO® (Rivaroxaban) ° °This medication education was reviewed with me or my healthcare representative as part of my discharge preparation.  The pharmacist that spoke with me during my hospital stay was:  Anvay Tennis Dien, RPH ° °Why was Xarelto® prescribed for you? °Xarelto® was prescribed for you to reduce the risk of blood clots forming after orthopedic surgery. The medical term for these abnormal blood clots is venous thromboembolism (VTE). ° °What do you need to know about xarelto® ? °Take your Xarelto® ONCE DAILY at the same time every day. °You may take it either with or without food. ° °If you have difficulty swallowing the tablet whole, you may crush it and mix in applesauce just prior to taking your dose. ° °Take Xarelto® exactly as prescribed by your doctor and DO NOT stop taking Xarelto® without talking to the doctor who prescribed the medication.  Stopping without other VTE prevention medication to take the place of Xarelto® may increase your risk of developing a clot. ° °After discharge, you should have regular check-up appointments with your healthcare provider that is prescribing your Xarelto®.   ° °What do you do if you miss a dose? °If you miss a dose, take it as soon as you remember on the same day then continue your regularly scheduled once daily regimen the next day. Do not take two doses of Xarelto® on the same day.  ° °Important Safety Information °A possible side effect of Xarelto® is bleeding. You should call your healthcare provider right away if you experience any of the following: °? Bleeding from an injury or your nose that does not stop. °? Unusual colored urine (red or dark brown) or unusual colored stools (red or black). °? Unusual bruising for unknown reasons. °? A serious fall or if you hit your head (even if there is no bleeding). ° °Some medicines may interact with Xarelto® and might increase your risk of bleeding while on Xarelto®. To help avoid  this, consult your healthcare provider or pharmacist prior to using any new prescription or non-prescription medications, including herbals, vitamins, non-steroidal anti-inflammatory drugs (NSAIDs) and supplements. ° °This website has more information on Xarelto®: www.xarelto.com. ° ° ° °

## 2014-10-02 NOTE — Progress Notes (Signed)
OT Cancellation Note  Patient Details Name: Felicia Gonzalez MRN: [id number] DOB: [birth year]   Cancelled Treatment:    Reason Eval/Treat Not Completed: OT screened. Pt's current D/C plan is SNF. No apparent immediate acute care OT needs, therefore will defer OT to SNF. If OT eval is needed please call Acute Rehab Dept. at [phone] or text page OT at [phone].    Benito Mccreedy OTR/L [phone] 10/02/2014, 1:46 PM

## 2014-10-02 NOTE — Evaluation (Signed)
Physical Therapy Evaluation Patient Details Name: Felicia Gonzalez MRN: [id number] DOB: 1941-01-09 Today's Date: 10/02/2014   History of Present Illness  Patient is a 74 y/o female s/p L THA. PMH of arthritis and asthma.  Clinical Impression  Patient presents with pain and post surgical deficits LLE s/p above surgery impacting mobility. Education provided on posterior hip precautions and reviewed HEP. Pt highly motivated to return to functional independence as pt very active PTA. Would benefit from skilled PT and ST SNF to improve transfers, gait, balance and mobility so pt can maximize independence and return to PLOF.    Follow Up Recommendations SNF    Equipment Recommendations  3in1 (PT)    Recommendations for Other Services       Precautions / Restrictions Precautions Precautions: Posterior Hip;Fall Precaution Booklet Issued: Yes (comment) Precaution Comments: Reviewed precautions. Required Braces or Orthoses: Knee Immobilizer - Left Restrictions Weight Bearing Restrictions: Yes LLE Weight Bearing: Weight bearing as tolerated      Mobility  Bed Mobility               General bed mobility comments: Sitting on BSC upon PT arrival.   Transfers Overall transfer level: Needs assistance Equipment used: Rolling walker (2 wheeled) Transfers: Sit to/from Stand Sit to Stand: Min guard         General transfer comment: Min guard to stand from [facility] x1, from chair x1 with cues for hand placement.   Ambulation/Gait Ambulation/Gait assistance: Min guard Ambulation Distance (Feet): 100 Feet Assistive device: Rolling walker (2 wheeled) Gait Pattern/deviations: Step-to pattern;Decreased stance time - left;Decreased step length - right;Trunk flexed   Gait velocity interpretation: Below normal speed for age/gender General Gait Details: Cues for RW management and step through gait. Dyspnea present. Vitals stable.  Stairs            Wheelchair Mobility    Modified  Rankin (Stroke Patients Only)       Balance Overall balance assessment: Needs assistance Sitting-balance support: Feet supported;No upper extremity supported Sitting balance-Leahy Scale: Fair     Standing balance support: During functional activity Standing balance-Leahy Scale: Poor Standing balance comment: Relient on RW.                             Pertinent Vitals/Pain Pain Assessment: Faces Faces Pain Scale: Hurts a little bit Pain Location: left hip Pain Descriptors / Indicators: Sore;Grimacing Pain Intervention(s): Monitored during session;Repositioned;Premedicated before session    Home Living Family/patient expects to be discharged to:: Skilled nursing facility                 Additional Comments: [facility] per pt report.    Prior Function Level of Independence: Independent with assistive device(s)         Comments: Pt was using RW PTA and very active. Pt has 6-7 STE home with HR and lives with spouse. Will have family stay with her for 1 week post discharge.     Hand Dominance        Extremity/Trunk Assessment   Upper Extremity Assessment: Defer to OT evaluation           Lower Extremity Assessment: LLE deficits/detail   LLE Deficits / Details: Ankle AROM WFL. Able to perform quad set.     Communication   Communication: No difficulties  Cognition Arousal/Alertness: Awake/alert Behavior During Therapy: WFL for tasks assessed/performed Overall Cognitive Status: Within Functional Limits for tasks assessed  Memory: Decreased recall of precautions              General Comments General comments (skin integrity, edema, etc.): Pt's family member present in room during session.     Exercises Total Joint Exercises Ankle Circles/Pumps: Both;20 reps;Seated Quad Sets: Both;10 reps;Seated Gluteal Sets: Both;10 reps;Seated      Assessment/Plan    PT Assessment Patient needs continued PT services  PT Diagnosis  Difficulty walking;Acute pain   PT Problem List Pain;Decreased range of motion;Decreased activity tolerance;Decreased balance;Decreased mobility;Decreased knowledge of precautions  PT Treatment Interventions Balance training;Gait training;Stair training;Functional mobility training;Patient/family education;Therapeutic activities;Therapeutic exercise   PT Goals (Current goals can be found in the Care Plan section) Acute Rehab PT Goals Patient Stated Goal: to go to rehab to get stronger and more independent PT Goal Formulation: With patient Time For Goal Achievement: 10/16/14 Potential to Achieve Goals: Good    Frequency BID (if time allows.)   Barriers to discharge        Co-evaluation               End of Session Equipment Utilized During Treatment: Left knee immobilizer;Gait belt Activity Tolerance: Patient tolerated treatment well Patient left: in chair;with call bell/phone within reach;with family/visitor present Nurse Communication: Mobility status         Time: 8590-9311 PT Time Calculation (min) (ACUTE ONLY): 23 min   Charges:   PT Evaluation $Initial PT Evaluation Tier I: 1 Procedure PT Treatments $Gait Training: 8-22 mins   PT G CodesCandy Sledge A 10-08-2014, 10:33 AM  Candy Sledge, PT, DPT [phone]

## 2014-10-02 NOTE — Progress Notes (Signed)
Physical Therapy Treatment Patient Details Name: Felicia Gonzalez MRN: [id number] DOB: [birth year] Today's Date: 10/02/2014    History of Present Illness Patient is a 74 y/o female s/p L THA. PMH of arthritis and asthma.    PT Comments    Pt is making progress toward goals and increasing functional independence. Pt slightly impulsive at times, requiring cues to slow down. She may benefit from an elevated toilet seat. Pt would benefit from continued PT to further increase functional independence. Continue with POC.   Follow Up Recommendations  SNF     Equipment Recommendations  3in1 (PT)    Recommendations for Other Services       Precautions / Restrictions Precautions Precautions: Posterior Hip;Fall Precaution Comments: Pt able to recall 2/3 precautions. Cues for adduction past midline precaution. Required Braces or Orthoses: Knee Immobilizer - Left Restrictions LLE Weight Bearing: Weight bearing as tolerated    Mobility  Bed Mobility Overal bed mobility: Needs Assistance Bed Mobility: Supine to Sit     Supine to sit: Min assist     General bed mobility comments: Min A to bring LLE to EOB. Use of rails to position trunk upright.  Transfers Overall transfer level: Needs assistance Equipment used: Rolling walker (2 wheeled) Transfers: Sit to/from Stand Sit to Stand: Min guard         General transfer comment: Min guard for safety. Cues for hand placement and to slow down for safety. LOB x1 upon standing, able to self-correct.  Ambulation/Gait Ambulation/Gait assistance: Min guard Ambulation Distance (Feet): 120 Feet Assistive device: Rolling walker (2 wheeled) Gait Pattern/deviations: Step-to pattern;Decreased stance time - left;Decreased step length - right;Trunk flexed   Gait velocity interpretation: Below normal speed for age/gender General Gait Details: Cues for RW management and forward head/upright posture.   Stairs            Wheelchair Mobility     Modified Rankin (Stroke Patients Only)       Balance                                    Cognition Arousal/Alertness: Awake/alert Behavior During Therapy: WFL for tasks assessed/performed Overall Cognitive Status: Within Functional Limits for tasks assessed                      Exercises      General Comments        Pertinent Vitals/Pain Pain Assessment: 0-10 Pain Score: 1  Pain Location: L hip Pain Descriptors / Indicators: Sore Pain Intervention(s): Monitored during session;Repositioned    Home Living                      Prior Function            PT Goals (current goals can now be found in the care plan section) Progress towards PT goals: Progressing toward goals    Frequency  BID    PT Plan Current plan remains appropriate    Co-evaluation             End of Session Equipment Utilized During Treatment: Gait belt;Left knee immobilizer Activity Tolerance: Patient tolerated treatment well Patient left: in chair;with call bell/phone within reach;with family/visitor present     Time: 2297-9892 PT Time Calculation (min) (ACUTE ONLY): 22 min  Charges:  $Gait Training: 8-22 mins  G CodesRubye Oaks, [place] 10/02/2014, 4:14 PM

## 2014-10-02 NOTE — Op Note (Signed)
NAMEKAVITHA, LANSDALE                 ACCOUNT NO.:  [id number]  MEDICAL RECORD NO.:  [id number]  LOCATION:  5N09C                        FACILITY:  [facility]  PHYSICIAN:  [name]. Whitfield, M.D.DATE OF BIRTH:  Nov 01, 1940  DATE OF PROCEDURE:  10/01/2014 DATE OF DISCHARGE:                              OPERATIVE REPORT   PREOPERATIVE DIAGNOSIS:  Chronic displaced subcapital fracture, left hip.  POSTOPERATIVE DIAGNOSIS:  Chronic displaced subcapital fracture, left hip.  PROCEDURE:  Left total hip replacement.  SURGEON:  [name]. Durward Fortes, M.D.  ASSISTANT:  Aaron Edelman D. Petrarca, PA-C.  ANESTHESIA:  General.  COMPLICATIONS:  None.  COMPONENTS:  DePuy large stature 13.5 mm femoral component, a 32 mm outer diameter hip ball with a 13 mm neck length and 48 mm outer diameter Gription 3 acetabulum component with an apex hole eliminator, and a Marathon polyethylene +4 liner with a 10-degree posterior lip. Components were press-fit.  DESCRIPTION OF PROCEDURE:  Ms. Lasorsa was met in the holding area, identified the left hip as the appropriate operative site.  Ms. Ramser had sustained an injury about 3-4 weeks ago presumably causing her fracture.  It has only been since yesterday that the diagnosis was made when she had initial office visit, so the leg was shortened and externally rotated.  There was some edema, but neurologically was intact.  Ms. Haydon was then transported to room #3 and placed under general anesthesia without difficulty.  The nursing staff inserted a Foley catheter.  Urine was clear.  The patient was then placed in the lateral decubitus position with the left side up and secured to the operating room table with the innominate hip system.  The left lower extremity was then prepped from iliac crest to the midcalf with chlorhexidine scrub and DuraPrep x2.  Sterile draping was performed.  Time-out was called.  Her routine Southern incision was utilized and via sharp  dissection, carried down to subcutaneous tissue.  Gross bleeders were Bovie coagulated.  Soft tissue was elevated, was separated with a self- retaining retractors.  The iliotibial band was then identified and incised along with the length of the skin incision.  Short external rotators were identified and incised in their attachment to the posterior aspect of the greater trochanter.  Tendinous structures were tagged with 0 Ethibond suture for subsequent repair.  The capsule was identified.  It was incised along the femoral neck and head.  There was old blood.  The head was completely detached from the neck.  I used a corkscrew to remove the head.  The acetabulum did have a substantial amount of callus which was debrided.  I had some difficulty to find the femoral shaft as it was covered with callus.  I carefully used the canal finder to find the shaft and then made a starter hole.  I reamed to 13 mm to accept a 13.5 component. Bone was very soft.  For her rheumatoid arthritis and her chronic steroid use, so I was very careful about aggressive reaming or rasping.  The calcar reamer was used to obtain the appropriate calcar angle after rasping to 13.5 large stature component which basically would be perfectly stable.  I checked the  canal finder on multiple occasions to be sure we were in the canal that there were no violations of the shaft.  Retractors were then placed about the acetabulum before the scar tissue was removed.  Bone was very soft.  Overall, the cartilage was intact. It was very soft and easily peeled.  She had a very shallow acetabulum which I deepened and reamed to 47 mm to accept a 48 component.  I injected a 46 and a 48, the 46 would, the 48 would not therefore, I inserted the Gription metallic acetabular component using the external guide.  I used a trial polyethylene component, inserted the 13, 5 large stature femoral component, and placed it through a full range  of motion. I had lot of toggling, so I eventually used a 13 mm neck length at which case, there was no further toggling and no instability.  The wound was then irrigated with saline solution.  Just because of a soft bone, I elected to use a single 20 mm titanium screw in the acetabulum for further security, irrigated the wound, again inserted the apex hole eliminator, and the marathon polyethylene liner.  The final 13.5 large stature femoral component was then impacted nicely on the calcar.  I cleaned the acetabulum to make sure it was clear of any soft tissue.  I then inserted the 13 mm neck length with a 32 mm outer diameter hip ball, reduced the hip.  I felt like her leg lengths were symmetrical.  I did not have a reference point  preoperatively because she was short and externally rotated, but I felt by checking the leg length that they were symmetrical.  Wound was again irrigated with saline solution.  I did check the sciatic nerve throughout the procedure and appeared to be well out of harm's way.  We closed the capsule anatomically with #1 Ethibond as I did the short external rotators.  The iliotibial band was closed with a running #1 Vicryl, subcu with 2 layers with 2-0 Vicryl and 3-0 Monocryl.  Skin was closed with skin clips. Sterile bulky dressing was applied.  The patient was then placed supine, extubated, and placed carefully in the operating room stretcher, returned to the postanesthesia recovery room in satisfactory condition.     [name]. Durward Fortes, M.D.     PWW/MEDQ  D:  10/01/2014  T:  10/02/2014  Job:  [id number]

## 2014-10-02 NOTE — Progress Notes (Signed)
Patient ID: ALSACE DOWD, female   DOB: September 18, 1940, 74 y.o.   MRN: [id number] PATIENT ID: Felicia Gonzalez        MRN:  [id number]          DOB/AGE: 1941/03/02 / 74 y.o.    Joni Fears, MD   Biagio Borg, PA-C [address] [place], [place]  [address]                             [phone]   PROGRESS NOTE  Subjective:  negative for Chest Pain  negative for Shortness of Breath  negative for Nausea/Vomiting   negative for Calf Pain    Tolerating Diet: yes         Patient reports pain as mild.     No complaints  Objective: Vital signs in last 24 hours:   Patient Vitals for the past 24 hrs:  BP Temp Temp src Pulse Resp SpO2  10/02/14 0507 124/61 mmHg 98.5 F (36.9 C) Oral 100 18 100 %  10/01/14 2129 (!) 154/70 mmHg 98.5 F (36.9 C) Oral (!) 109 17 97 %  10/01/14 2038 (!) 147/71 mmHg 98.3 F (36.8 C) - 100 14 96 %  10/01/14 2015 (!) 157/75 mmHg 98.2 F (36.8 C) - 93 12 93 %  10/01/14 2000 (!) 141/65 mmHg - - 91 13 93 %  10/01/14 1945 (!) 160/76 mmHg - - 89 10 100 %  10/01/14 1936 (!) 160/76 mmHg - - 94 12 100 %  10/01/14 1930 (!) 157/71 mmHg - - 99 (!) 21 99 %  10/01/14 1915 (!) 160/87 mmHg - - 99 19 100 %  10/01/14 1905 (!) 154/90 mmHg 97.9 F (36.6 C) - 97 (!) 21 100 %  10/01/14 1517 135/69 mmHg 98.2 F (36.8 C) Oral 84 18 100 %  10/01/14 1300 (!) 141/69 mmHg 98.6 F (37 C) - 88 16 100 %      Intake/Output from previous day:   05/03 0701 - 05/04 0700 In: 1675 [I.V.:1250] Out: 1450 [Urine:950]   Intake/Output this shift:       Intake/Output      05/03 0701 - 05/04 0700 05/04 0701 - 05/05 0700   I.V. (mL/kg) 1250 (18.7)    Blood 325    IV Piggyback 100    Total Intake(mL/kg) 1675 (25)    Urine (mL/kg/hr) 950 (0.6)    Blood 500 (0.3)    Total Output 1450     Net +225          Urine Occurrence 1 x       LABORATORY DATA:  Recent Labs  09/30/14 2325 09/30/14 2337 10/01/14 0555  WBC 8.8  --  6.5  HGB 11.1* 12.6 10.3*  HCT 34.6* 37.0 32.6*  PLT  332  --  336    Recent Labs  09/30/14 2325 09/30/14 2337 10/01/14 0555  NA 134* 133* 137  K 4.3 4.3 4.2  CL 97* 96* 101  CO2 27  --  26  BUN 13 17 8   CREATININE 0.67 0.70 0.62  GLUCOSE 108* 108* 114*  CALCIUM 9.1  --  8.9   Lab Results  Component Value Date   INR 1.00 09/30/2014    Recent Radiographic Studies :  Dg Pelvis Portable  10/01/2014   CLINICAL DATA:  74 year old female status post left hip arthroplasty  EXAM: PORTABLE PELVIS 1-2 VIEWS; LEFT HIP (WITH PELVIS) 1 VIEW PORTABLE  COMPARISON:  None.  FINDINGS: Interval surgical changes of left total hip arthroplasty. There appears to be a lucency through the superior aspect of the greater trochanter. However, this lucency appears to extend beyond the bony margin is favored to represent subcutaneous emphysema mimicking a fracture line. The femoral head component is located with respect to the acetabular component on the cross-table lateral view. The visualized bony pelvis appears osteopenic. No pelvic fracture. Expected subcutaneous emphysema.  IMPRESSION: 1. Left total hip arthroplasty. 2. New no definite hardware complication. Lucency overlying the superior aspect of the greater trochanter favored to reflect subcutaneous emphysema mimicking a fracture line.   Electronically Signed   By: Jacqulynn Cadet M.D.   On: 10/01/2014 19:37   Dg Chest Port 1 View  09/30/2014   CLINICAL DATA:  Hip fracture  EXAM: PORTABLE CHEST - 1 VIEW  COMPARISON:  None.  FINDINGS: Normal mediastinum and cardiac silhouette. Normal pulmonary vasculature. No evidence of effusion, infiltrate, or pneumothorax. No acute bony abnormality. There is a rounded sclerotic density measuring 3.4 by 2.1 cm projecting over the right first rib. This is not appear to be in the lung parenchyma and may be associated with a rib 4 of the skin.  IMPRESSION: No acute cardiopulmonary process.  Sclerotic lesion projecting over the right lung apex is indeterminate. This may associated  with the bone and would potentially be palpable.   Electronically Signed   By: Suzy Bouchard M.D.   On: 09/30/2014 23:47   Dg Hip Port Unilat With Pelvis 1v Left  10/01/2014   CLINICAL DATA:  74 year old female status post left hip arthroplasty  EXAM: PORTABLE PELVIS 1-2 VIEWS; LEFT HIP (WITH PELVIS) 1 VIEW PORTABLE  COMPARISON:  None.  FINDINGS: Interval surgical changes of left total hip arthroplasty. There appears to be a lucency through the superior aspect of the greater trochanter. However, this lucency appears to extend beyond the bony margin is favored to represent subcutaneous emphysema mimicking a fracture line. The femoral head component is located with respect to the acetabular component on the cross-table lateral view. The visualized bony pelvis appears osteopenic. No pelvic fracture. Expected subcutaneous emphysema.  IMPRESSION: 1. Left total hip arthroplasty. 2. New no definite hardware complication. Lucency overlying the superior aspect of the greater trochanter favored to reflect subcutaneous emphysema mimicking a fracture line.   Electronically Signed   By: Jacqulynn Cadet M.D.   On: 10/01/2014 19:37     Examination:  General appearance: alert, cooperative and no distress  Wound Exam: clean, dry, intact   Drainage:  None: wound tissue dry  Motor Exam: EHL, FHL, Anterior Tibial and Posterior Tibial Intact  Sensory Exam: Superficial Peroneal, Deep Peroneal and Tibial normal  Vascular Exam: Normal  Assessment:    1 Day Post-Op  Procedure(s) (LRB): TOTAL HIP ARTHROPLASTY (Left)  ADDITIONAL DIAGNOSIS:  Principal Problem:   Subcapital fracture of left hip Active Problems:   Closed left hip fracture   Normocytic anemia   Rheumatoid arthritis   Hip fracture  Acute Blood Loss Anemia -received 1 unit packed cells in OR/RR, awaiting am lab results  Plan: Physical Therapy as ordered Weight Bearing as Tolerated (WBAT)  DVT Prophylaxis:  Xarelto, Foot Pumps and TED  hose  DISCHARGE PLAN: Home  DISCHARGE NEEDS: HHPT, Walker and 3-in-1 comode seat D/C foley, check labs when available, OOB with PT- will need osteoporosis workup as OP-bone very soft       Kaleisha Bhargava W  10/02/2014 7:24 AM

## 2014-10-02 NOTE — Progress Notes (Signed)
[facility] PROGRESS NOTE  Felicia Gonzalez QMG:[id number] DOB: [birth year] DOA: 09/30/2014 PCP: Harle Battiest, MD  Assessment/Plan:  Principal Problem:   Subcapital fracture of left hip - Orthosis team with management - Continue supportive therapy  Active Problems:   Normocytic anemia - Recent drop most likely secondary to recent operation. - Will reassess CBC levels next a.m.    Rheumatoid arthritis -Stable  Code Status: Full Family Communication: No family at bedside Disposition Plan: Pending improvement in condition   Consultants:  none  Procedures: 1 Day Post-Op Procedure(s) (LRB): TOTAL HIP ARTHROPLASTY (Left)  Antibiotics:  None  HPI/Subjective: Patient has no new complaints. No acute issues reported overnight  Objective: Filed Vitals:   10/02/14 1300  BP: 123/52  Pulse: 100  Temp: 98.2 F (36.8 C)  Resp: 18    Intake/Output Summary (Last 24 hours) at 10/02/14 1604 Last data filed at 10/02/14 1300  Gross per 24 hour  Intake   1915 ml  Output   1450 ml  Net    465 ml   Filed Weights   10/01/14 0400  Weight: 67 kg (147 lb 11.3 oz)    Exam:   General:  Patient in no acute distress, alert and awake  Cardiovascular: Regular rate and rhythm, no murmurs rubs  Respiratory: Clear to auscultation bilaterally, no wheezes  Abdomen: Soft, nondistended, nontender  Musculoskeletal: No cyanosis or clubbing   Data Reviewed: Basic Metabolic Panel:  Recent Labs Lab 09/30/14 2325 09/30/14 2337 10/01/14 0555 10/02/14 0630  NA 134* 133* 137 135  K 4.3 4.3 4.2 4.1  CL 97* 96* 101 100*  CO2 27  --  26 27  GLUCOSE 108* 108* 114* 130*  BUN 13 17 8 10   CREATININE 0.67 0.70 0.62 0.59  CALCIUM 9.1  --  8.9 8.5*   Liver Function Tests:  Recent Labs Lab 10/01/14 0555  AST 18  ALT 21  ALKPHOS 133*  BILITOT 0.4  PROT 6.0*  ALBUMIN 2.5*   No results for input(s): LIPASE, AMYLASE in the last 168 hours. No results for input(s): AMMONIA in the  last 168 hours. CBC:  Recent Labs Lab 09/30/14 2325 09/30/14 2337 10/01/14 0555 10/02/14 0630  WBC 8.8  --  6.5 11.6*  NEUTROABS 5.6  --   --   --   HGB 11.1* 12.6 10.3* 9.5*  HCT 34.6* 37.0 32.6* 28.5*  MCV 84.4  --  83.4 84.1  PLT 332  --  336 256   Cardiac Enzymes: No results for input(s): CKTOTAL, CKMB, CKMBINDEX, TROPONINI in the last 168 hours. BNP (last 3 results) No results for input(s): BNP in the last 8760 hours.  ProBNP (last 3 results) No results for input(s): PROBNP in the last 8760 hours.  CBG:  Recent Labs Lab 10/01/14 0438 10/02/14 0625 10/02/14 1119  GLUCAP 106* 135* 178*    Recent Results (from the past 240 hour(s))  Urine culture     Status: None   Collection Time: 10/01/14  5:14 AM  Result Value Ref Range Status   Specimen Description URINE, CLEAN CATCH  Final   Special Requests NONE  Final   Colony Count   Final    20,OOO COLONIES/ML Performed at [facility]    Culture   Final    Multiple bacterial morphotypes present, none predominant. Suggest appropriate recollection if clinically indicated. Performed at [facility]    Report Status 10/02/2014 FINAL  Final  Surgical pcr screen     Status: None  Collection Time: 10/01/14 12:59 PM  Result Value Ref Range Status   MRSA, PCR NEGATIVE NEGATIVE Final   Staphylococcus aureus NEGATIVE NEGATIVE Final    Comment:        The Xpert SA Assay (FDA approved for NASAL specimens in patients over 67 years of age), is one component of a comprehensive surveillance program.  Test performance has been validated by [facility] for patients greater than or equal to 50 year old. It is not intended to diagnose infection nor to guide or monitor treatment.      Studies: Dg Pelvis Portable  10/01/2014   CLINICAL DATA:  74 year old female status post left hip arthroplasty  EXAM: PORTABLE PELVIS 1-2 VIEWS; LEFT HIP (WITH PELVIS) 1 VIEW PORTABLE  COMPARISON:  None.  FINDINGS: Interval  surgical changes of left total hip arthroplasty. There appears to be a lucency through the superior aspect of the greater trochanter. However, this lucency appears to extend beyond the bony margin is favored to represent subcutaneous emphysema mimicking a fracture line. The femoral head component is located with respect to the acetabular component on the cross-table lateral view. The visualized bony pelvis appears osteopenic. No pelvic fracture. Expected subcutaneous emphysema.  IMPRESSION: 1. Left total hip arthroplasty. 2. New no definite hardware complication. Lucency overlying the superior aspect of the greater trochanter favored to reflect subcutaneous emphysema mimicking a fracture line.   Electronically Signed   By: Jacqulynn Cadet M.D.   On: 10/01/2014 19:37   Dg Chest Port 1 View  09/30/2014   CLINICAL DATA:  Hip fracture  EXAM: PORTABLE CHEST - 1 VIEW  COMPARISON:  None.  FINDINGS: Normal mediastinum and cardiac silhouette. Normal pulmonary vasculature. No evidence of effusion, infiltrate, or pneumothorax. No acute bony abnormality. There is a rounded sclerotic density measuring 3.4 by 2.1 cm projecting over the right first rib. This is not appear to be in the lung parenchyma and may be associated with a rib 4 of the skin.  IMPRESSION: No acute cardiopulmonary process.  Sclerotic lesion projecting over the right lung apex is indeterminate. This may associated with the bone and would potentially be palpable.   Electronically Signed   By: Suzy Bouchard M.D.   On: 09/30/2014 23:47   Dg Hip Port Unilat With Pelvis 1v Left  10/01/2014   CLINICAL DATA:  74 year old female status post left hip arthroplasty  EXAM: PORTABLE PELVIS 1-2 VIEWS; LEFT HIP (WITH PELVIS) 1 VIEW PORTABLE  COMPARISON:  None.  FINDINGS: Interval surgical changes of left total hip arthroplasty. There appears to be a lucency through the superior aspect of the greater trochanter. However, this lucency appears to extend beyond the bony  margin is favored to represent subcutaneous emphysema mimicking a fracture line. The femoral head component is located with respect to the acetabular component on the cross-table lateral view. The visualized bony pelvis appears osteopenic. No pelvic fracture. Expected subcutaneous emphysema.  IMPRESSION: 1. Left total hip arthroplasty. 2. New no definite hardware complication. Lucency overlying the superior aspect of the greater trochanter favored to reflect subcutaneous emphysema mimicking a fracture line.   Electronically Signed   By: Jacqulynn Cadet M.D.   On: 10/01/2014 19:37    Scheduled Meds: . acetaminophen  1,000 mg Intravenous 4 times per day  . docusate sodium  100 mg Oral BID  . rivaroxaban  10 mg Oral Q breakfast   Continuous Infusions: . sodium chloride Stopped (10/01/14 1739)     Time spent: > 35 minutes  Velvet Bathe  [facility] Pager [phone] If 7PM-7AM, please contact night-coverage at [email or web address], password [facility] 10/02/2014, 4:04 PM  LOS: 2 days

## 2014-10-03 DIAGNOSIS — S72012S Unspecified intracapsular fracture of left femur, sequela: Secondary | ICD-10-CM

## 2014-10-03 LAB — BASIC METABOLIC PANEL
ANION GAP: 6 (ref 5–15)
BUN: 8 mg/dL (ref 6–20)
CALCIUM: 8.3 mg/dL — AB (ref 8.9–10.3)
CHLORIDE: 104 mmol/L (ref 101–111)
CO2: 29 mmol/L (ref 22–32)
CREATININE: 0.63 mg/dL (ref 0.44–1.00)
GFR calc non Af Amer: >60 mL/min (ref >60)
Glucose, Bld: 91 mg/dL (ref 70–99)
Potassium: 4.2 mmol/L (ref 3.5–5.1)
Sodium: 139 mmol/L (ref 135–145)

## 2014-10-03 LAB — GLUCOSE, CAPILLARY
GLUCOSE-CAPILLARY: 101 mg/dL — AB (ref 70–99)
Glucose-Capillary: 91 mg/dL (ref 70–99)
Glucose-Capillary: 97 mg/dL (ref 70–99)
Glucose-Capillary: 121 mg/dL — ABNORMAL HIGH (ref 70–99)

## 2014-10-03 LAB — CBC
HCT: 27.5 % — ABNORMAL LOW (ref 36.0–46.0)
Hemoglobin: 9 g/dL — ABNORMAL LOW (ref 12.0–15.0)
MCH: 27.6 pg (ref 26.0–34.0)
MCHC: 32.7 g/dL (ref 30.0–36.0)
MCV: 84.4 fL (ref 78.0–100.0)
PLATELETS: 258 10*3/uL (ref 150–400)
RBC: 3.26 MIL/uL — ABNORMAL LOW (ref 3.87–5.11)
RDW: 14.5 % (ref 11.5–15.5)
WBC: 12.8 10*3/uL — ABNORMAL HIGH (ref 4.0–10.5)

## 2014-10-03 MED ORDER — RIVAROXABAN 10 MG PO TABS: 10.0000 mg | ORAL_TABLET | Freq: Every day | ORAL | Status: DC

## 2014-10-03 MED ORDER — HYDROCODONE-ACETAMINOPHEN 5-325 MG PO TABS
2.0000 | ORAL_TABLET | Freq: Four times a day (QID) | ORAL | Status: DC | PRN
  Administered 2014-10-03 – 2014-10-04 (×3): 2 via ORAL
  Filled 2014-10-03 (×3): qty 2

## 2014-10-03 MED ORDER — HYDROCODONE-ACETAMINOPHEN 5-325 MG PO TABS: 1.0000 | ORAL_TABLET | Freq: Four times a day (QID) | ORAL | Status: DC | PRN

## 2014-10-03 MED ORDER — OXYCODONE HCL 5 MG PO TABS
5.0000 mg | ORAL_TABLET | ORAL | Status: DC | PRN
  Filled 2014-10-03: qty 1

## 2014-10-03 NOTE — Progress Notes (Signed)
Physical Therapy Treatment Patient Details Name: Felicia Gonzalez MRN: [id number] DOB: 01-25-1941 Today's Date: 10/03/2014    History of Present Illness Patient is a 74 y/o female s/p L THA. PMH of arthritis and asthma.    PT Comments    Deferred progressive ambulation and exercises this session per pt just waking up and requesting to eat, although pt agreeable to ambulate from bed to chair to eat. Attempt to see pt again this afternoon for progressive ambulation and HEP as schedule allows.  Follow Up Recommendations  SNF     Equipment Recommendations  3in1 (PT)    Recommendations for Other Services       Precautions / Restrictions Precautions Precautions: Posterior Hip;Fall Required Braces or Orthoses: Knee Immobilizer - Left Restrictions LLE Weight Bearing: Weight bearing as tolerated    Mobility  Bed Mobility Overal bed mobility: Needs Assistance Bed Mobility: Supine to Sit     Supine to sit: Min assist     General bed mobility comments: Min A to bring LLE to EOB. Use of rails to position trunk upright.  Transfers Overall transfer level: Needs assistance Equipment used: Rolling walker (2 wheeled) Transfers: Sit to/from Stand Sit to Stand: Min guard         General transfer comment: Min guard for safety. Cues for hand placement, position of LLE, and to push from bed rather than pull on RW for sit to stand.  Ambulation/Gait Ambulation/Gait assistance: Min guard Ambulation Distance (Feet): 5 Feet Assistive device: Rolling walker (2 wheeled) Gait Pattern/deviations: Step-to pattern;Decreased stance time - left;Decreased step length - right;Trunk flexed   Gait velocity interpretation: Below normal speed for age/gender General Gait Details: Pt able to ambulate from bed to chair. Progressive ambulation deferred this session due to pt lethargic from just waking up and pt requesting to eat lunch.   Stairs            Wheelchair Mobility    Modified Rankin  (Stroke Patients Only)       Balance                                    Cognition Arousal/Alertness: Lethargic (Pt asleep when SPTA entered room.) Behavior During Therapy: WFL for tasks assessed/performed Overall Cognitive Status: Within Functional Limits for tasks assessed                      Exercises      General Comments        Pertinent Vitals/Pain Pain Assessment: 0-10 Pain Score: 1  Pain Location: L hip Pain Descriptors / Indicators: Sore Pain Intervention(s): Monitored during session;Repositioned    Home Living                      Prior Function            PT Goals (current goals can now be found in the care plan section) Progress towards PT goals: Progressing toward goals    Frequency  BID    PT Plan Current plan remains appropriate    Co-evaluation             End of Session Equipment Utilized During Treatment: Left knee immobilizer Activity Tolerance: Patient limited by lethargy Patient left: in chair;with call bell/phone within reach     Time: 1259-1312 PT Time Calculation (min) (ACUTE ONLY): 13 min  Charges:  G CodesRubye Oaks, [place] 10/03/2014, 1:19 PM

## 2014-10-03 NOTE — Progress Notes (Signed)
Subjective: 2 Days Post-Op Procedure(s) (LRB): TOTAL HIP ARTHROPLASTY (Left) Patient reports pain as mild and moderate.    Objective: Vital signs in last 24 hours: Temp:  [98.2 F (36.8 C)-98.7 F (37.1 C)] 98.7 F (37.1 C) (05/05 0608) Pulse Rate:  [100-109] 109 (05/05 0608) Resp:  [18] 18 (05/04 1300) BP: (123-149)/(52-76) 149/76 mmHg (05/05 0608) SpO2:  [100 %] 100 % (05/05 0608)  Intake/Output from previous day: 05/04 0701 - 05/05 0700 In: 780 [P.O.:480; IV Piggyback:300] Out: -  Intake/Output this shift:     Recent Labs  09/30/14 2337 10/01/14 0555 10/01/14 1807 10/02/14 0630 10/03/14 0422  HGB 12.6 10.3* 8.8* 9.5* 9.0*    Recent Labs  10/02/14 0630 10/03/14 0422  WBC 11.6* 12.8*  RBC 3.39* 3.26*  HCT 28.5* 27.5*  PLT 256 258    Recent Labs  10/02/14 0630 10/03/14 0422  NA 135 139  K 4.1 4.2  CL 100* 104  CO2 27 29  BUN 10 8  CREATININE 0.59 0.63  GLUCOSE 130* 91  CALCIUM 8.5* 8.3*    Recent Labs  09/30/14 2325  INR 1.00    Neurovascular intact Dorsiflexion/Plantar flexion intact Incision: dressing C/D/I and no drainage dressing changed.  Wound looks great  Assessment/Plan: 2 Days Post-Op Procedure(s) (LRB): TOTAL HIP ARTHROPLASTY (Left) Discharge to SNF at hospitalist's discretion  [facility] 10/03/2014, 12:05 PM

## 2014-10-03 NOTE — Progress Notes (Signed)
[facility] PROGRESS NOTE  Felicia Gonzalez BTD:[id number] DOB: [birth year] DOA: 09/30/2014 PCP: Harle Battiest, MD  Brief narrative: Patient is a 74 year old with history of rheumatoid arthritis who presented to the ER after a fall found to have left hip fracture. Orthopedic surgery subsequently consulted and has had procedure listed below. Has been evaluated by physical therapy and currently plans are to discharge  Assessment/Plan:  Principal Problem:   Subcapital fracture of left hip - Orthosis team with management - Patient reports that pain is not well-controlled and has required Dilaudid administration. Will increase oral pain medication regimen that patient is able to participate with physical therapy and depending less on IV opioid regimen. With improved pain control will plan on transitioning to skilled nursing facility most likely within the next one or 2 days given improvement.  Active Problems:   Normocytic anemia - Recent drop most likely secondary to recent operation. - Stable at 9. No reports of bleeding    Rheumatoid arthritis -Stable  Code Status: Full Family Communication: No family at bedside Disposition Plan: Please see above   Consultants:  Orthopedic surgery  Procedures: 2 Day Post-Op Procedure(s) (LRB): TOTAL HIP ARTHROPLASTY (Left)  Antibiotics:  None  HPI/Subjective: Patient's only complaint is that pain is not well controlled on current pain medication regimen  Objective: Filed Vitals:   10/03/14 0608  BP: 149/76  Pulse: 109  Temp: 98.7 F (37.1 C)  Resp:     Intake/Output Summary (Last 24 hours) at 10/03/14 1510 Last data filed at 10/03/14 0830  Gross per 24 hour  Intake    540 ml  Output      0 ml  Net    540 ml   Filed Weights   10/01/14 0400  Weight: 67 kg (147 lb 11.3 oz)    Exam:   General:  Patient in no acute distress, alert and awake  Cardiovascular: Regular rate and rhythm, no murmurs rubs  Respiratory: Clear to  auscultation bilaterally, no wheezes  Abdomen: Soft, nondistended, nontender  Musculoskeletal: No cyanosis or clubbing   Data Reviewed: Basic Metabolic Panel:  Recent Labs Lab 09/30/14 2325 09/30/14 2337 10/01/14 0555 10/01/14 1807 10/02/14 0630 10/03/14 0422  NA 134* 133* 137 134* 135 139  K 4.3 4.3 4.2 4.6 4.1 4.2  CL 97* 96* 101  --  100* 104  CO2 27  --  26  --  27 29  GLUCOSE 108* 108* 114*  --  130* 91  BUN 13 17 8   --  10 8  CREATININE 0.67 0.70 0.62  --  0.59 0.63  CALCIUM 9.1  --  8.9  --  8.5* 8.3*   Liver Function Tests:  Recent Labs Lab 10/01/14 0555  AST 18  ALT 21  ALKPHOS 133*  BILITOT 0.4  PROT 6.0*  ALBUMIN 2.5*   No results for input(s): LIPASE, AMYLASE in the last 168 hours. No results for input(s): AMMONIA in the last 168 hours. CBC:  Recent Labs Lab 09/30/14 2325 09/30/14 2337 10/01/14 0555 10/01/14 1807 10/02/14 0630 10/03/14 0422  WBC 8.8  --  6.5  --  11.6* 12.8*  NEUTROABS 5.6  --   --   --   --   --   HGB 11.1* 12.6 10.3* 8.8* 9.5* 9.0*  HCT 34.6* 37.0 32.6* 26.0* 28.5* 27.5*  MCV 84.4  --  83.4  --  84.1 84.4  PLT 332  --  336  --  256 258   Cardiac Enzymes: No results for  input(s): CKTOTAL, CKMB, CKMBINDEX, TROPONINI in the last 168 hours. BNP (last 3 results) No results for input(s): BNP in the last 8760 hours.  ProBNP (last 3 results) No results for input(s): PROBNP in the last 8760 hours.  CBG:  Recent Labs Lab 10/02/14 1119 10/02/14 1630 10/03/14 0050 10/03/14 0547 10/03/14 1159  GLUCAP 178* 195* 101* 91 97    Recent Results (from the past 240 hour(s))  Urine culture     Status: None   Collection Time: 10/01/14  5:14 AM  Result Value Ref Range Status   Specimen Description URINE, CLEAN CATCH  Final   Special Requests NONE  Final   Colony Count   Final    20,OOO COLONIES/ML Performed at [facility]    Culture   Final    Multiple bacterial morphotypes present, none predominant. Suggest  appropriate recollection if clinically indicated. Performed at [facility]    Report Status 10/02/2014 FINAL  Final  Surgical pcr screen     Status: None   Collection Time: 10/01/14 12:59 PM  Result Value Ref Range Status   MRSA, PCR NEGATIVE NEGATIVE Final   Staphylococcus aureus NEGATIVE NEGATIVE Final    Comment:        The Xpert SA Assay (FDA approved for NASAL specimens in patients over 38 years of age), is one component of a comprehensive surveillance program.  Test performance has been validated by [facility] for patients greater than or equal to 46 year old. It is not intended to diagnose infection nor to guide or monitor treatment.      Studies: Dg Pelvis Portable  10/01/2014   CLINICAL DATA:  74 year old female status post left hip arthroplasty  EXAM: PORTABLE PELVIS 1-2 VIEWS; LEFT HIP (WITH PELVIS) 1 VIEW PORTABLE  COMPARISON:  None.  FINDINGS: Interval surgical changes of left total hip arthroplasty. There appears to be a lucency through the superior aspect of the greater trochanter. However, this lucency appears to extend beyond the bony margin is favored to represent subcutaneous emphysema mimicking a fracture line. The femoral head component is located with respect to the acetabular component on the cross-table lateral view. The visualized bony pelvis appears osteopenic. No pelvic fracture. Expected subcutaneous emphysema.  IMPRESSION: 1. Left total hip arthroplasty. 2. New no definite hardware complication. Lucency overlying the superior aspect of the greater trochanter favored to reflect subcutaneous emphysema mimicking a fracture line.   Electronically Signed   By: Jacqulynn Cadet M.D.   On: 10/01/2014 19:37   Dg Hip Port Unilat With Pelvis 1v Left  10/01/2014   CLINICAL DATA:  74 year old female status post left hip arthroplasty  EXAM: PORTABLE PELVIS 1-2 VIEWS; LEFT HIP (WITH PELVIS) 1 VIEW PORTABLE  COMPARISON:  None.  FINDINGS: Interval surgical changes  of left total hip arthroplasty. There appears to be a lucency through the superior aspect of the greater trochanter. However, this lucency appears to extend beyond the bony margin is favored to represent subcutaneous emphysema mimicking a fracture line. The femoral head component is located with respect to the acetabular component on the cross-table lateral view. The visualized bony pelvis appears osteopenic. No pelvic fracture. Expected subcutaneous emphysema.  IMPRESSION: 1. Left total hip arthroplasty. 2. New no definite hardware complication. Lucency overlying the superior aspect of the greater trochanter favored to reflect subcutaneous emphysema mimicking a fracture line.   Electronically Signed   By: Jacqulynn Cadet M.D.   On: 10/01/2014 19:37    Scheduled Meds: . docusate sodium  100 mg Oral BID  . rivaroxaban  10 mg Oral Q breakfast   Continuous Infusions: . sodium chloride Stopped (10/01/14 1739)     Time spent: > 35 minutes    Velvet Bathe  [facility] Pager [phone] If 7PM-7AM, please contact night-coverage at [email or web address], password [facility] 10/03/2014, 3:10 PM  LOS: 3 days

## 2014-10-04 LAB — BASIC METABOLIC PANEL
Anion gap: 6 (ref 5–15)
BUN: 6 mg/dL (ref 6–20)
CHLORIDE: 100 mmol/L — AB (ref 101–111)
CO2: 30 mmol/L (ref 22–32)
Calcium: 8.3 mg/dL — ABNORMAL LOW (ref 8.9–10.3)
Creatinine, Ser: 0.61 mg/dL (ref 0.44–1.00)
GFR calc Af Amer: >60 mL/min (ref >60)
GFR calc non Af Amer: >60 mL/min (ref >60)
GLUCOSE: 93 mg/dL (ref 70–99)
POTASSIUM: 3.5 mmol/L (ref 3.5–5.1)
Sodium: 136 mmol/L (ref 135–145)

## 2014-10-04 LAB — CBC
HCT: 26.5 % — ABNORMAL LOW (ref 36.0–46.0)
Hemoglobin: 8.6 g/dL — ABNORMAL LOW (ref 12.0–15.0)
MCH: 27.1 pg (ref 26.0–34.0)
MCHC: 32.5 g/dL (ref 30.0–36.0)
MCV: 83.6 fL (ref 78.0–100.0)
PLATELETS: 262 10*3/uL (ref 150–400)
RBC: 3.17 MIL/uL — ABNORMAL LOW (ref 3.87–5.11)
RDW: 14.7 % (ref 11.5–15.5)
WBC: 10.3 10*3/uL (ref 4.0–10.5)

## 2014-10-04 LAB — GLUCOSE, CAPILLARY
Glucose-Capillary: 106 mg/dL — ABNORMAL HIGH (ref 70–99)
Glucose-Capillary: 92 mg/dL (ref 70–99)

## 2014-10-04 MED ORDER — POTASSIUM CHLORIDE CRYS ER 20 MEQ PO TBCR
40.0000 meq | EXTENDED_RELEASE_TABLET | Freq: Once | ORAL | Status: AC
  Administered 2014-10-04: 40 meq via ORAL
  Filled 2014-10-04: qty 2

## 2014-10-04 MED ORDER — ADULT MULTIVITAMIN W/MINERALS CH: 1.0000 | ORAL_TABLET | Freq: Every day | ORAL | Status: DC

## 2014-10-04 MED ORDER — PREDNISONE 5 MG PO TABS: 5.0000 mg | ORAL_TABLET | Freq: Every day | ORAL | Status: DC

## 2014-10-04 MED ORDER — BISACODYL 10 MG RE SUPP: 10.0000 mg | Freq: Every day | RECTAL | Status: DC | PRN

## 2014-10-04 MED ORDER — POLYETHYLENE GLYCOL 3350 17 G PO PACK: 17.0000 g | PACK | Freq: Every day | ORAL | Status: DC | PRN

## 2014-10-04 MED ORDER — DOCUSATE SODIUM 100 MG PO CAPS: 100.0000 mg | ORAL_CAPSULE | Freq: Two times a day (BID) | ORAL | Status: DC

## 2014-10-04 NOTE — Progress Notes (Signed)
Physical Therapy Treatment Patient Details Name: Felicia Gonzalez MRN: [id number] DOB: 06-01-1940 Today's Date: 10/04/2014    History of Present Illness Patient is a 74 y/o female s/p L THA. PMH of arthritis and asthma.    PT Comments    Pt is making progress toward goals and increasing functional independence. Able to progress ambulation and perform HEP this AM with increased ROM. Pt would benefit from continued PT to further increase functional independence and safety. Continue to recommend SNF for ongoing PT.  Follow Up Recommendations  SNF     Equipment Recommendations  3in1 (PT)    Recommendations for Other Services       Precautions / Restrictions Precautions Precautions: Posterior Hip;Fall Precaution Comments: Pt able to recall 2/3 precautions. Cues to recall hip rotation precaution. Restrictions LLE Weight Bearing: Weight bearing as tolerated    Mobility  Bed Mobility               General bed mobility comments: Pt in chair before and after.  Transfers Overall transfer level: Needs assistance Equipment used: Rolling walker (2 wheeled) Transfers: Sit to/from Stand Sit to Stand: Min guard         General transfer comment: Min guard for safety. Cues for hand placement and position of LLE.  Ambulation/Gait Ambulation/Gait assistance: Min guard Ambulation Distance (Feet): 180 Feet Assistive device: Rolling walker (2 wheeled) Gait Pattern/deviations: Step-to pattern;Decreased stance time - left;Decreased step length - right   Gait velocity interpretation: Below normal speed for age/gender General Gait Details: Cues for forward head, RW management, and WB more through LEs vs UEs on RW.   Stairs            Wheelchair Mobility    Modified Rankin (Stroke Patients Only)       Balance                                    Cognition Arousal/Alertness: Awake/alert Behavior During Therapy: WFL for tasks assessed/performed Overall  Cognitive Status: Within Functional Limits for tasks assessed       Memory: Decreased recall of precautions              Exercises Total Joint Exercises Quad Sets: AROM;Both;10 reps Heel Slides: AAROM;Left;10 reps Hip ABduction/ADduction: AAROM;Left;10 reps Long Arc Quad: AROM;Both;10 reps    General Comments        Pertinent Vitals/Pain Pain Assessment: 0-10 Pain Score: 2  Pain Location: L hip Pain Descriptors / Indicators: Sore Pain Intervention(s): Monitored during session    Home Living                      Prior Function            PT Goals (current goals can now be found in the care plan section) Progress towards PT goals: Progressing toward goals    Frequency  BID    PT Plan Current plan remains appropriate    Co-evaluation             End of Session Equipment Utilized During Treatment: Gait belt Activity Tolerance: Patient tolerated treatment well Patient left: in chair;with call bell/phone within reach     Time: 9509-3267 PT Time Calculation (min) (ACUTE ONLY): 26 min  Charges:                       G Codes:  Felicia Gonzalez, [place] 10/04/2014, 10:28 AM

## 2014-10-04 NOTE — Progress Notes (Signed)
Patient ID: Felicia Gonzalez, female   DOB: Jul 14, 1940, 74 y.o.   MRN: [id number] PATIENT ID: Felicia Gonzalez        MRN:  [id number]          DOB/AGE: 09-Mar-1941 / 74 y.o.    Felicia Fears, MD   Biagio Borg, PA-C [address] [place], [place]  [address]                             [phone]   PROGRESS NOTE  Subjective:  negative for Chest Pain  negative for Shortness of Breath  negative for Nausea/Vomiting   negative for Calf Pain    Tolerating Diet: yes         Patient reports pain as mild.     Slept well last night  Objective: Vital signs in last 24 hours:   Patient Vitals for the past 24 hrs:  BP Temp Temp src Pulse Resp SpO2  10/04/14 0503 123/60 mmHg 98.1 F (36.7 C) Oral (!) 103 17 99 %  10/03/14 2050 (!) 155/68 mmHg 98.4 F (36.9 C) Axillary (!) 109 15 97 %  10/03/14 1806 (!) 139/57 mmHg 98.6 F (37 C) Oral (!) 108 14 100 %      Intake/Output from previous day:   05/05 0701 - 05/06 0700 In: 840 [P.O.:840] Out: -    Intake/Output this shift:       Intake/Output      05/05 0701 - 05/06 0700 05/06 0701 - 05/07 0700   P.O. 840    IV Piggyback     Total Intake(mL/kg) 840 (12.5)    Net +840          Urine Occurrence 4 x    Stool Occurrence 2 x       LABORATORY DATA:  Recent Labs  09/30/14 2325 09/30/14 2337 10/01/14 0555 10/01/14 1807 10/02/14 0630 10/03/14 0422 10/04/14 0440  WBC 8.8  --  6.5  --  11.6* 12.8* 10.3  HGB 11.1* 12.6 10.3* 8.8* 9.5* 9.0* 8.6*  HCT 34.6* 37.0 32.6* 26.0* 28.5* 27.5* 26.5*  PLT 332  --  336  --  256 258 262    Recent Labs  09/30/14 2325 09/30/14 2337 10/01/14 0555 10/01/14 1807 10/02/14 0630 10/03/14 0422 10/04/14 0440  NA 134* 133* 137 134* 135 139 136  K 4.3 4.3 4.2 4.6 4.1 4.2 3.5  CL 97* 96* 101  --  100* 104 100*  CO2 27  --  26  --  27 29 30   BUN 13 17 8   --  10 8 6   CREATININE 0.67 0.70 0.62  --  0.59 0.63 0.61  GLUCOSE 108* 108* 114*  --  130* 91 93  CALCIUM 9.1  --  8.9  --  8.5* 8.3* 8.3*     Lab Results  Component Value Date   INR 1.00 09/30/2014    Recent Radiographic Studies :  Dg Pelvis Portable  10/01/2014   CLINICAL DATA:  74 year old female status post left hip arthroplasty  EXAM: PORTABLE PELVIS 1-2 VIEWS; LEFT HIP (WITH PELVIS) 1 VIEW PORTABLE  COMPARISON:  None.  FINDINGS: Interval surgical changes of left total hip arthroplasty. There appears to be a lucency through the superior aspect of the greater trochanter. However, this lucency appears to extend beyond the bony margin is favored to represent subcutaneous emphysema mimicking a fracture line. The femoral head component is located with respect to the acetabular  component on the cross-table lateral view. The visualized bony pelvis appears osteopenic. No pelvic fracture. Expected subcutaneous emphysema.  IMPRESSION: 1. Left total hip arthroplasty. 2. New no definite hardware complication. Lucency overlying the superior aspect of the greater trochanter favored to reflect subcutaneous emphysema mimicking a fracture line.   Electronically Signed   By: Jacqulynn Cadet M.D.   On: 10/01/2014 19:37   Dg Chest Port 1 View  09/30/2014   CLINICAL DATA:  Hip fracture  EXAM: PORTABLE CHEST - 1 VIEW  COMPARISON:  None.  FINDINGS: Normal mediastinum and cardiac silhouette. Normal pulmonary vasculature. No evidence of effusion, infiltrate, or pneumothorax. No acute bony abnormality. There is a rounded sclerotic density measuring 3.4 by 2.1 cm projecting over the right first rib. This is not appear to be in the lung parenchyma and may be associated with a rib 4 of the skin.  IMPRESSION: No acute cardiopulmonary process.  Sclerotic lesion projecting over the right lung apex is indeterminate. This may associated with the bone and would potentially be palpable.   Electronically Signed   By: Suzy Bouchard M.D.   On: 09/30/2014 23:47   Dg Hip Port Unilat With Pelvis 1v Left  10/01/2014   CLINICAL DATA:  74 year old female status post left hip  arthroplasty  EXAM: PORTABLE PELVIS 1-2 VIEWS; LEFT HIP (WITH PELVIS) 1 VIEW PORTABLE  COMPARISON:  None.  FINDINGS: Interval surgical changes of left total hip arthroplasty. There appears to be a lucency through the superior aspect of the greater trochanter. However, this lucency appears to extend beyond the bony margin is favored to represent subcutaneous emphysema mimicking a fracture line. The femoral head component is located with respect to the acetabular component on the cross-table lateral view. The visualized bony pelvis appears osteopenic. No pelvic fracture. Expected subcutaneous emphysema.  IMPRESSION: 1. Left total hip arthroplasty. 2. New no definite hardware complication. Lucency overlying the superior aspect of the greater trochanter favored to reflect subcutaneous emphysema mimicking a fracture line.   Electronically Signed   By: Jacqulynn Cadet M.D.   On: 10/01/2014 19:37     Examination:  General appearance: alert, cooperative and mild distress  Wound Exam: clean, dry, intact   Drainage:  Scant/small amount Serosanguinous exudate  Motor Exam: EHL, FHL, Anterior Tibial and Posterior Tibial Intact  Sensory Exam: Superficial Peroneal, Deep Peroneal and Tibial normal  Vascular Exam: Right posterior tibial artery has trace pulse  Assessment:    3 Days Post-Op  Procedure(s) (LRB): TOTAL HIP ARTHROPLASTY (Left)  ADDITIONAL DIAGNOSIS:  Principal Problem:   Subcapital fracture of left hip Active Problems:   Closed left hip fracture   Normocytic anemia   Rheumatoid arthritis   Hip fracture  Acute Blood Loss Anemia asymptomatic    Plan: Physical Therapy as ordered Weight Bearing as Tolerated (WBAT)  DVT Prophylaxis:  Xarelto and TED hose  DISCHARGE PLAN: Skilled Nursing Facility/Rehab today  DISCHARGE NEEDS: HHPT, Walker and 3-in-1 comode seat   OK for D/C from Ortho Standpoint        [facility]  10/04/2014 8:23 AM

## 2014-10-04 NOTE — Discharge Planning (Signed)
Patient will discharge today per MD order. Patient will discharge to [facility] RN to call report prior to transportation to: [phone] Transportation: [facility]  CSW sent discharge summary to SNF for review.  Packet is complete.  RN, patient and family aware of discharge plans.  Nonnie Done, [place] [phone]  Psychiatric & Orthopedics (5N 1-16) Clinical Social Worker

## 2014-10-04 NOTE — Discharge Summary (Signed)
Felicia Gonzalez, is a 74 y.o. female  DOB 11/01/1940  MRN [id number].  Admission date:  09/30/2014  Admitting Physician  Rise Patience, MD  Discharge Date:  10/04/2014   Primary MD  Harle Battiest, MD  Recommendations for primary care physician for things to follow:  - Patient to follow with orthopedic by 5/16, decision regarding Xarelto for DVT prophylaxis to be determined by orthopedic. - Check CBC, BMP during next visit   Admission Diagnosis  Closed left hip fracture, initial encounter [S72.002A]   Discharge Diagnosis  Closed left hip fracture, initial encounter [S72.002A]   Principal Problem:   Subcapital fracture of left hip Active Problems:   Closed left hip fracture   Normocytic anemia   Rheumatoid arthritis   Hip fracture      Past Medical History  Diagnosis Date  . Arthritis     rheumatoid  . Asthma     Past Surgical History  Procedure Laterality Date  . No past surgeries    . Total hip arthroplasty Left 10/01/2014    Procedure: TOTAL HIP ARTHROPLASTY;  Surgeon: Garald Balding, MD;  Location: [facility];  Service: Orthopedics;  Laterality: Left;       History of present illness and  Hospital Course:     Kindly see H&P for history of present illness and admission details, please review complete Labs, Consult reports and Test reports for all details in brief  HPI  from the history and physical done on the day of admission  Patient is a 74 year old with history of rheumatoid arthritis who presented to the ER after a fall found to have left hip fracture. Orthopedic surgery subsequently consulted and has had left total hip arthroplasty  . Has been evaluated by physical therapy and currently plans are to discharge skilled nursing facility.  Hospital Course   Subcapital fracture of left hip -  Status post left total hip arthroplasty left total hip arthroplasty 10/01/14 by Dr.Peter  Durward Fortes - Follow-up with orthopedic as an outpatient. - Xarelto for DVT prophylaxis, decision by orthopedic when to stop As an outpatient   Normocytic anemia - Recent drop most likely secondary to recent operation.    Rheumatoid arthritis -Stable, will discharge and prednisone 5 mg oral daily, her baseline dose for many years(reports she was temporarily increased to 10 mg daily for last 2 weeks).     Discharge Condition: Stable   Follow UP  Follow-up Information    Follow up with [facility], [name], MD. Schedule an appointment as soon as possible for a visit on 10/14/2014.   Specialty:  Orthopedic Surgery   Contact information:   [address] [place] [address] [phone]       Follow up with [facility], MD.   Specialty:  Obstetrics and Gynecology   Contact information:   [place] [address] [phone]         Discharge Instructions  and  Discharge Medications     Discharge Instructions  Call MD / Call 911    Complete by:  As directed   If you experience chest pain or shortness of breath, CALL 911 and be transported to the hospital emergency room.  If you develop a fever above 101 F, pus (white drainage) or increased drainage or redness at the wound, or calf pain, call your surgeon's office.     Change dressing    Complete by:  As directed   DO NOT CHANGE DRESSING TILL SEEN IN OFFICE     Constipation Prevention    Complete by:  As directed   Drink plenty of fluids.  Prune juice and/or coffee may be helpful.  You may use a stool softener, such as Colace (over the counter) 100 mg twice a day.  Use MiraLax (over the counter) for constipation as needed but this may take several days to work.  Mag Citrate --OR-- Milk of Magnesia --OR -- Dulcolax pills/suppositories may also be used but follow directions on the label.     Diet - low sodium heart healthy    Complete by:  As directed      Diet general     Complete by:  As directed      Discharge instructions    Complete by:  As directed   YOU WERE GIVEN A DEVICE CALLED AN INCENTIVE SPIROMETER TO HELP YOU TAKE DEEP BREATHS.  PLEASE USE THIS AT LEAST TEN (10) TIMES EVERY 1-2 HOURS EVERY DAY TO PREVENT PNEUMONIA.     Discharge instructions    Complete by:  As directed   Follow with Primary MD [name],[name], MD in 7 days   Get CBC, CMP, 2 view Chest X ray checked  by Primary MD next visit.    Activity: PT to evaluate   Disposition SNF   Diet: Heart Healthy  , with feeding assistance and aspiration precautions.  For Heart failure patients - Check your Weight same time everyday, if you gain over 2 pounds, or you develop in leg swelling, experience more shortness of breath or chest pain, call your Primary MD immediately. Follow Cardiac Low Salt Diet and 1.5 lit/day fluid restriction.   On your next visit with your primary care physician please Get Medicines reviewed and adjusted.   Please request your Prim.MD to go over all Hospital Tests and Procedure/Radiological results at the follow up, please get all Hospital records sent to your Prim MD by signing hospital release before you go home.   If you experience worsening of your admission symptoms, develop shortness of breath, life threatening emergency, suicidal or homicidal thoughts you must seek medical attention immediately by calling 911 or calling your MD immediately  if symptoms less severe.  You Must read complete instructions/literature along with all the possible adverse reactions/side effects for all the Medicines you take and that have been prescribed to you. Take any new Medicines after you have completely understood and accpet all the possible adverse reactions/side effects.   Do not drive, operating heavy machinery, perform activities at heights, swimming or participation in water activities or provide baby sitting services if your were admitted for syncope or siezures until you have  seen by Primary MD or a Neurologist and advised to do so again.  Do not drive when taking Pain medications.    Do not take more than prescribed Pain, Sleep and Anxiety Medications  Special Instructions: If you have smoked or chewed Tobacco  in the last 2 yrs please stop smoking, stop any regular Alcohol  and or  any Recreational drug use.  Wear Seat belts while driving.   Please note  You were cared for by a hospitalist during your hospital stay. If you have any questions about your discharge medications or the care you received while you were in the hospital after you are discharged, you can call the unit and asked to speak with the hospitalist on call if the hospitalist that took care of you is not available. Once you are discharged, your primary care physician will handle any further medical issues. Please note that NO REFILLS for any discharge medications will be authorized once you are discharged, as it is imperative that you return to your primary care physician (or establish a relationship with a primary care physician if you do not have one) for your aftercare needs so that they can reassess your need for medications and monitor your lab values.     Follow the hip precautions as taught in Physical Therapy    Complete by:  As directed      Patient may shower    Complete by:  As directed   You may shower over the brown dressing.  DO NOT REMOVE DRESSING     TED hose    Complete by:  As directed   Use stockings (TED hose) for 1-2 weeks on operative leg(s).  You may remove them at night for sleeping. May stop the NON-operative leg stocking when you go home.     Weight bearing as tolerated    Complete by:  As directed             Medication List    STOP taking these medications        traMADol 50 MG tablet  Commonly known as:  ULTRAM      TAKE these medications        bisacodyl 10 MG suppository  Commonly known as:  DULCOLAX  Place 1 suppository (10 mg total) rectally daily  as needed for moderate constipation.     docusate sodium 100 MG capsule  Commonly known as:  COLACE  Take 1 capsule (100 mg total) by mouth 2 (two) times daily.     HYDROcodone-acetaminophen 5-325 MG per tablet  Commonly known as:  NORCO/VICODIN  Take 1-2 tablets by mouth every 6 (six) hours as needed for moderate pain.     MULTI-VITAMIN PO  Take 1 tablet by mouth daily.     polyethylene glycol packet  Commonly known as:  MIRALAX / GLYCOLAX  Take 17 g by mouth daily as needed for mild constipation.     predniSONE 5 MG tablet  Commonly known as:  DELTASONE  Take 1 tablet (5 mg total) by mouth daily with breakfast.     rivaroxaban 10 MG Tabs tablet  Commonly known as:  XARELTO  Take 1 tablet (10 mg total) by mouth daily with breakfast.          Diet and Activity recommendation: See Discharge Instructions above   Consults obtained - orthopedic   Major procedures and Radiology Reports - PLEASE review detailed and final reports for all details, in brief -   Left total hip replacement 5/3,by Dr [name]. [place]   Dg Pelvis Portable  10/01/2014   CLINICAL DATA:  74 year old female status post left hip arthroplasty  EXAM: PORTABLE PELVIS 1-2 VIEWS; LEFT HIP (WITH PELVIS) 1 VIEW PORTABLE  COMPARISON:  None.  FINDINGS: Interval surgical changes of left total hip arthroplasty. There appears to be a lucency through the superior  aspect of the greater trochanter. However, this lucency appears to extend beyond the bony margin is favored to represent subcutaneous emphysema mimicking a fracture line. The femoral head component is located with respect to the acetabular component on the cross-table lateral view. The visualized bony pelvis appears osteopenic. No pelvic fracture. Expected subcutaneous emphysema.  IMPRESSION: 1. Left total hip arthroplasty. 2. New no definite hardware complication. Lucency overlying the superior aspect of the greater trochanter favored to reflect subcutaneous  emphysema mimicking a fracture line.   Electronically Signed   By: Jacqulynn Cadet M.D.   On: 10/01/2014 19:37   Dg Chest Port 1 View  09/30/2014   CLINICAL DATA:  Hip fracture  EXAM: PORTABLE CHEST - 1 VIEW  COMPARISON:  None.  FINDINGS: Normal mediastinum and cardiac silhouette. Normal pulmonary vasculature. No evidence of effusion, infiltrate, or pneumothorax. No acute bony abnormality. There is a rounded sclerotic density measuring 3.4 by 2.1 cm projecting over the right first rib. This is not appear to be in the lung parenchyma and may be associated with a rib 4 of the skin.  IMPRESSION: No acute cardiopulmonary process.  Sclerotic lesion projecting over the right lung apex is indeterminate. This may associated with the bone and would potentially be palpable.   Electronically Signed   By: Suzy Bouchard M.D.   On: 09/30/2014 23:47   Dg Hip Port Unilat With Pelvis 1v Left  10/01/2014   CLINICAL DATA:  74 year old female status post left hip arthroplasty  EXAM: PORTABLE PELVIS 1-2 VIEWS; LEFT HIP (WITH PELVIS) 1 VIEW PORTABLE  COMPARISON:  None.  FINDINGS: Interval surgical changes of left total hip arthroplasty. There appears to be a lucency through the superior aspect of the greater trochanter. However, this lucency appears to extend beyond the bony margin is favored to represent subcutaneous emphysema mimicking a fracture line. The femoral head component is located with respect to the acetabular component on the cross-table lateral view. The visualized bony pelvis appears osteopenic. No pelvic fracture. Expected subcutaneous emphysema.  IMPRESSION: 1. Left total hip arthroplasty. 2. New no definite hardware complication. Lucency overlying the superior aspect of the greater trochanter favored to reflect subcutaneous emphysema mimicking a fracture line.   Electronically Signed   By: Jacqulynn Cadet M.D.   On: 10/01/2014 19:37    Micro Results     Recent Results (from the past 240 hour(s))    Urine culture     Status: None   Collection Time: 10/01/14  5:14 AM  Result Value Ref Range Status   Specimen Description URINE, CLEAN CATCH  Final   Special Requests NONE  Final   Colony Count   Final    20,OOO COLONIES/ML Performed at [facility]    Culture   Final    Multiple bacterial morphotypes present, none predominant. Suggest appropriate recollection if clinically indicated. Performed at [facility]    Report Status 10/02/2014 FINAL  Final  Surgical pcr screen     Status: None   Collection Time: 10/01/14 12:59 PM  Result Value Ref Range Status   MRSA, PCR NEGATIVE NEGATIVE Final   Staphylococcus aureus NEGATIVE NEGATIVE Final    Comment:        The Xpert SA Assay (FDA approved for NASAL specimens in patients over 54 years of age), is one component of a comprehensive surveillance program.  Test performance has been validated by [facility] for patients greater than or equal to 71 year old. It is not intended to diagnose  infection nor to guide or monitor treatment.        Today   Subjective:   Felicia Gonzalez today has no headache,no chest abdominal pain,no new weakness tingling or numbness, feels much better wants to go home today.   Objective:   Blood pressure 123/60, pulse 103, temperature 98.1 F (36.7 C), temperature source Oral, resp. rate 17, height 5\' 1"  (1.549 m), weight 67 kg (147 lb 11.3 oz), SpO2 99 %.   Intake/Output Summary (Last 24 hours) at 10/04/14 1127 Last data filed at 10/04/14 0900  Gross per 24 hour  Intake    720 ml  Output      0 ml  Net    720 ml    Exam Awake Alert, Oriented x 3, No new F.N deficits, Normal affect [place].AT,PERRAL Supple Neck,No JVD, No cervical lymphadenopathy appriciated.  Symmetrical Chest wall movement, Good air movement bilaterally, CTAB RRR,No Gallops,Rubs or new Murmurs, No Parasternal Heave +ve B.Sounds, Abd Soft, Non tender, No organomegaly appriciated, No rebound -guarding or  rigidity. No Cyanosis, Clubbing or edema, No new Rash or bruise, good pulses bilaterally, surgical scar covered with bandage, no oozing or discharge noticed.  Data Review   CBC w Diff: Lab Results  Component Value Date   WBC 10.3 10/04/2014   HGB 8.6* 10/04/2014   HCT 26.5* 10/04/2014   PLT 262 10/04/2014   LYMPHOPCT 19 09/30/2014   MONOPCT 15* 09/30/2014   EOSPCT 2 09/30/2014   BASOPCT 0 09/30/2014    CMP: Lab Results  Component Value Date   NA 136 10/04/2014   K 3.5 10/04/2014   CL 100* 10/04/2014   CO2 30 10/04/2014   BUN 6 10/04/2014   CREATININE 0.61 10/04/2014   PROT 6.0* 10/01/2014   ALBUMIN 2.5* 10/01/2014   BILITOT 0.4 10/01/2014   ALKPHOS 133* 10/01/2014   AST 18 10/01/2014   ALT 21 10/01/2014  .   Total Time in preparing paper work, data evaluation and todays exam - 35 minutes  Felicia Gonzalez M.D on 10/04/2014 at 11:27 AM  [facility]   Office  [phone]

## 2014-10-04 NOTE — Clinical Social Work Placement (Signed)
   CLINICAL SOCIAL WORK PLACEMENT  NOTE  Date:  10/04/2014  Patient Details  Name: Felicia Gonzalez MRN: [id number] Date of Birth: [birth year]  Clinical Social Work is seeking post-discharge placement for this patient at the [place] level of care (*CSW will initial, date and re-position this form in  chart as items are completed):  Yes   Patient/family provided with [facility]'s list of facilities offering this level of care within the geographic area requested by the patient (or if unable, by the patient's family).  Yes   Patient/family informed of their freedom to choose among providers that offer the needed level of care, that participate in Medicare, Medicaid or managed care program needed by the patient, have an available bed and are willing to accept the patient.  Yes   Patient/family informed of [facility]'s ownership interest in [facility] and [facility], as well as of the fact that they are under no obligation to receive care at these facilities.  PASRR submitted to EDS on 10/02/14     PASRR number received on 10/02/14     Existing PASRR number confirmed on       FL2 transmitted to all facilities in geographic area requested by pt/family on 10/02/14     FL2 transmitted to all facilities within larger geographic area on       Patient informed that his/her managed care company has contracts with or will negotiate with certain facilities, including the following:        Yes   Patient/family informed of bed offers received.  Patient chooses bed at [facility]     Physician recommends and patient chooses bed at      Patient to be transferred to [facility] on  .  Patient to be transferred to facility by [facility]     Patient family notified on   10/04/2014 of transfer.  Name of family member notified:    [name]- daughter    PHYSICIAN       Additional Comment:     _______________________________________________ Dulcy Fanny, LCSW 10/04/2014, 12:10 PM

## 2014-10-07 ENCOUNTER — Encounter: Payer: Self-pay | Admitting: Adult Health

## 2014-10-07 ENCOUNTER — Non-Acute Institutional Stay (SKILLED_NURSING_FACILITY): Payer: Medicare Other | Admitting: Adult Health

## 2014-10-07 DIAGNOSIS — S72012S Unspecified intracapsular fracture of left femur, sequela: Secondary | ICD-10-CM | POA: Diagnosis not present

## 2014-10-07 DIAGNOSIS — D649 Anemia, unspecified: Secondary | ICD-10-CM | POA: Diagnosis not present

## 2014-10-07 DIAGNOSIS — K5900 Constipation, unspecified: Secondary | ICD-10-CM

## 2014-10-07 DIAGNOSIS — M069 Rheumatoid arthritis, unspecified: Secondary | ICD-10-CM | POA: Diagnosis not present

## 2014-10-07 DIAGNOSIS — E43 Unspecified severe protein-calorie malnutrition: Secondary | ICD-10-CM

## 2014-10-07 NOTE — Progress Notes (Signed)
Patient ID: Felicia Gonzalez, female   DOB: 1940/10/14, 74 y.o.   MRN: [id number]   10/07/2014  Facility:  Nursing Home Location:  [place] Room Number: 1202-P LEVEL OF CARE:  SNF (31)   Chief Complaint  Patient presents with  . Hospitalization Follow-up    Left hip fracture S/P left total hip arthroplasty, normocytic anemia, rheumatoid arthritis, constipation and protein calorie malnutrition    HISTORY OF PRESENT ILLNESS:  This is a 74 year old female who has been admitted to [facility] on 10/04/14 from [facility]. She has PMH of asthma and rheumatoid arthritis. She had a fall and sustained a left hip fracture.  She had a left total hip arthroplasty on 10/01/14.   Noted that her left hip surgical site was leaking clear drainage. Her Aquacel dressing was needed to be removed due to moderate amount of drainage coming out from the left hip surgical site. Patient reports that her pain is tolerable. She is able to transfer from chair to bed independently with the use of a walker. Daughter is at bedside.  She has been admitted for a short-term rehabilitation.  PAST MEDICAL HISTORY:  Past Medical History  Diagnosis Date  . Arthritis     rheumatoid  . Asthma     CURRENT MEDICATIONS: Reviewed per MAR/see medication list  Allergies  Allergen Reactions  . Penicillins Other (See Comments)    unknown     REVIEW OF SYSTEMS:  GENERAL: no change in appetite, no fatigue, no weight changes, no fever, chills or weakness RESPIRATORY: no cough, SOB, DOE, wheezing, hemoptysis CARDIAC: no chest pain, or palpitations GI: no abdominal pain, diarrhea, constipation, heart burn, nausea or vomiting  PHYSICAL EXAMINATION  GENERAL: no acute distress, normal body habitus SKIN:  Left hip surgical site has staples intact; no redness, moderate amount of clear drainage noted EYES: conjunctivae normal, sclerae normal, normal eye lids NECK: supple, trachea midline, no  neck masses, no thyroid tenderness, no thyromegaly LYMPHATICS: no LAN in the neck, no supraclavicular LAN RESPIRATORY: breathing is even & unlabored, BS CTAB CARDIAC: RRR, no murmur,no extra heart sounds, LLE edema 2+ GI: abdomen soft, normal BS, no masses, no tenderness, no hepatomegaly, no splenomegaly EXTREMITIES:  Able to move 4 extremities PSYCHIATRIC: the patient is alert & oriented to person, affect & behavior appropriate  LABS/RADIOLOGY: Labs reviewed: Basic Metabolic Panel:  Recent Labs  10/02/14 0630 10/03/14 0422 10/04/14 0440  NA 135 139 136  K 4.1 4.2 3.5  CL 100* 104 100*  CO2 27 29 30   GLUCOSE 130* 91 93  BUN 10 8 6   CREATININE 0.59 0.63 0.61  CALCIUM 8.5* 8.3* 8.3*   Liver Function Tests:  Recent Labs  10/01/14 0555  AST 18  ALT 21  ALKPHOS 133*  BILITOT 0.4  PROT 6.0*  ALBUMIN 2.5*   CBC:  Recent Labs  09/30/14 2325  10/02/14 0630 10/03/14 0422 10/04/14 0440  WBC 8.8  < > 11.6* 12.8* 10.3  NEUTROABS 5.6  --   --   --   --   HGB 11.1*  < > 9.5* 9.0* 8.6*  HCT 34.6*  < > 28.5* 27.5* 26.5*  MCV 84.4  < > 84.1 84.4 83.6  PLT 332  < > 256 258 262  < > = values in this interval not displayed.  CBG:  Recent Labs  10/03/14 1809 10/04/14 0007 10/04/14 0611  GLUCAP 121* 106* 92     Dg Pelvis  Portable  10/01/2014   CLINICAL DATA:  74 year old female status post left hip arthroplasty  EXAM: PORTABLE PELVIS 1-2 VIEWS; LEFT HIP (WITH PELVIS) 1 VIEW PORTABLE  COMPARISON:  None.  FINDINGS: Interval surgical changes of left total hip arthroplasty. There appears to be a lucency through the superior aspect of the greater trochanter. However, this lucency appears to extend beyond the bony margin is favored to represent subcutaneous emphysema mimicking a fracture line. The femoral head component is located with respect to the acetabular component on the cross-table lateral view. The visualized bony pelvis appears osteopenic. No pelvic fracture. Expected  subcutaneous emphysema.  IMPRESSION: 1. Left total hip arthroplasty. 2. New no definite hardware complication. Lucency overlying the superior aspect of the greater trochanter favored to reflect subcutaneous emphysema mimicking a fracture line.   Electronically Signed   By: Jacqulynn Cadet M.D.   On: 10/01/2014 19:37   Dg Chest Port 1 View  09/30/2014   CLINICAL DATA:  Hip fracture  EXAM: PORTABLE CHEST - 1 VIEW  COMPARISON:  None.  FINDINGS: Normal mediastinum and cardiac silhouette. Normal pulmonary vasculature. No evidence of effusion, infiltrate, or pneumothorax. No acute bony abnormality. There is a rounded sclerotic density measuring 3.4 by 2.1 cm projecting over the right first rib. This is not appear to be in the lung parenchyma and may be associated with a rib 4 of the skin.  IMPRESSION: No acute cardiopulmonary process.  Sclerotic lesion projecting over the right lung apex is indeterminate. This may associated with the bone and would potentially be palpable.   Electronically Signed   By: Suzy Bouchard M.D.   On: 09/30/2014 23:47   Dg Hip Port Unilat With Pelvis 1v Left  10/01/2014   CLINICAL DATA:  74 year old female status post left hip arthroplasty  EXAM: PORTABLE PELVIS 1-2 VIEWS; LEFT HIP (WITH PELVIS) 1 VIEW PORTABLE  COMPARISON:  None.  FINDINGS: Interval surgical changes of left total hip arthroplasty. There appears to be a lucency through the superior aspect of the greater trochanter. However, this lucency appears to extend beyond the bony margin is favored to represent subcutaneous emphysema mimicking a fracture line. The femoral head component is located with respect to the acetabular component on the cross-table lateral view. The visualized bony pelvis appears osteopenic. No pelvic fracture. Expected subcutaneous emphysema.  IMPRESSION: 1. Left total hip arthroplasty. 2. New no definite hardware complication. Lucency overlying the superior aspect of the greater trochanter favored to  reflect subcutaneous emphysema mimicking a fracture line.   Electronically Signed   By: Jacqulynn Cadet M.D.   On: 10/01/2014 19:37    ASSESSMENT/PLAN:  Left hip fracture S/P left total hip arthroplasty - informed orthosurgeon regarding moderate amount of drainage and a follow-up appointment was scheduled with Dr. Joni Fears on 10/10/14; continue Xarelto 10 mg by mouth daily for DVT 4P Lexis and Norco 5/325 mg 1-2 tabs by mouth every 6 hours when necessary for pain; for rehabilitation Normocytic anemia - hgb 8.6; will monitor Rheumatoid arthritis - continue prednisone 5 mg by mouth daily Constipation - continue MiraLAX 17 g by mouth daily, Colace 100 mg by mouth twice a day and Dulcolax 10 mg 1 per rectum daily when necessary   protein calorie malnutrition, severe - albumin 2.5; RD consult    Goals of care:  Short-term rehabilitation   Labs/test ordered:  CBC, BMP   Spent 50 minutes in patient care.     [facility], NP [employer] [phone]

## 2014-10-08 ENCOUNTER — Non-Acute Institutional Stay (SKILLED_NURSING_FACILITY): Payer: Medicare Other | Admitting: Internal Medicine

## 2014-10-08 ENCOUNTER — Encounter: Payer: Self-pay | Admitting: Internal Medicine

## 2014-10-08 DIAGNOSIS — S72012S Unspecified intracapsular fracture of left femur, sequela: Secondary | ICD-10-CM | POA: Diagnosis not present

## 2014-10-08 DIAGNOSIS — K5901 Slow transit constipation: Secondary | ICD-10-CM

## 2014-10-08 DIAGNOSIS — E46 Unspecified protein-calorie malnutrition: Secondary | ICD-10-CM

## 2014-10-08 DIAGNOSIS — M069 Rheumatoid arthritis, unspecified: Secondary | ICD-10-CM

## 2014-10-08 DIAGNOSIS — M62838 Other muscle spasm: Secondary | ICD-10-CM

## 2014-10-08 DIAGNOSIS — D62 Acute posthemorrhagic anemia: Secondary | ICD-10-CM | POA: Diagnosis not present

## 2014-10-08 DIAGNOSIS — T814XXA Infection following a procedure, initial encounter: Secondary | ICD-10-CM

## 2014-10-08 DIAGNOSIS — IMO0001 Reserved for inherently not codable concepts without codable children: Secondary | ICD-10-CM

## 2014-10-08 NOTE — Progress Notes (Signed)
Patient ID: CHER FRANZONI, female   DOB: December 05, 1940, 74 y.o.   MRN: [id number]     [facility]   PCP: [facility], MD  Code Status: full code  Allergies  Allergen Reactions  . Penicillins Other (See Comments)    unknown    Chief Complaint  Patient presents with  . New Admit To SNF     HPI:  74 year old patient is here for short term rehabilitation post hospital admission from 09/30/14-10/04/14 with left hip subcapital fracture. She underwent left total hip arthroplasty. Her pain is under control with current regimen. She has muscle spasm/ tightness. She complaints of feeling weak and tired this am. She has also been feverish. She mentions that she had drainage from her incision site yesterday and her dressing had to be changed. No other concerns She has PMH of RA, asthma and normocytic anemia  Review of Systems:  Constitutional: Negative for chills, diaphoresis.  HENT: Negative for headache, congestion, nasal discharge Eyes: Negative for eye pain, blurred vision, double vision and discharge.  Respiratory: Negative for cough, shortness of breath and wheezing.   Cardiovascular: Negative for chest pain, palpitations, leg swelling.  Gastrointestinal: Negative for heartburn, nausea, vomiting, abdominal pain. Had a bowel movement this am Genitourinary: Negative for dysuria and flank pain.  Musculoskeletal: Negative for back pain, falls Skin: Negative for itching, rash.  Neurological: positive for weakness. Negative for dizziness, tingling, focal weakness Psychiatric/Behavioral: Negative for depression    Past Medical History  Diagnosis Date  . Arthritis     rheumatoid  . Asthma    Past Surgical History  Procedure Laterality Date  . No past surgeries    . Total hip arthroplasty Left 10/01/2014    Procedure: TOTAL HIP ARTHROPLASTY;  Surgeon: Garald Balding, MD;  Location: [facility];  Service: Orthopedics;  Laterality: Left;   Social History:  reports that she has never smoked. She does not have any smokeless tobacco history on file. She reports that she does not drink alcohol or use illicit drugs.  Family History  Problem Relation Age of Onset  . Hypertension Father     Medications: Patient's Medications  New Prescriptions   No medications on file  Previous Medications   BISACODYL (DULCOLAX) 10 MG SUPPOSITORY    Place 1 suppository (10 mg total) rectally daily as needed for moderate constipation.   DOCUSATE SODIUM (COLACE) 100 MG CAPSULE    Take 1 capsule (100 mg total) by mouth 2 (two) times daily.   HYDROCODONE-ACETAMINOPHEN (NORCO/VICODIN) 5-325 MG PER TABLET    Take 1-2 tablets by mouth every 6 (six) hours as needed for moderate pain.   MULTIPLE VITAMIN (MULTI-VITAMIN PO)    Take 1 tablet by mouth daily.   POLYETHYLENE GLYCOL (MIRALAX / GLYCOLAX) PACKET    Take 17 g by mouth daily as needed for mild constipation.   PREDNISONE (DELTASONE) 5 MG TABLET    Take 1 tablet (5 mg total) by mouth daily with breakfast.   RIVAROXABAN (XARELTO) 10 MG TABS TABLET    Take 1 tablet (10 mg total) by mouth daily with breakfast.  Modified Medications   No medications on file  Discontinued Medications   No medications on file     Physical Exam: Filed Vitals:   10/08/14 1225  BP: 128/64  Pulse: 76  Temp: 97.4 F (36.3 C)  Resp: 18  SpO2: 96%    General- elderly female, in no acute distress Head- normocephalic, atraumatic Nose- no maxillary  or frontal sinus tenderness, no nasal discharge Throat- moist mucus membrane Eyes- PERRLA, EOMI, no pallor, no icterus, no discharge, normal conjunctiva, normal sclera Neck- no cervical lymphadenopathy Cardiovascular- normal s1,s2, no murmurs, palpable dorsalis pedis and radial pulses, left 1+ leg edema Respiratory- bilateral clear to auscultation, no wheeze, no rhonchi, no crackles, no use of accessory muscles Abdomen- bowel sounds present, soft, non tender Musculoskeletal- able to move  all 4 extremities, left leg limited range of motion  Neurological- no focal deficit Skin- warm and dry, left thigh surgical incision with staples in place, yellowish-green minimal drainage, erythema around incision site, dressing with some drainage on it Psychiatry- alert and oriented to person, place and time, normal mood and affect    Labs reviewed: Basic Metabolic Panel:  Recent Labs  10/02/14 0630 10/03/14 0422 10/04/14 0440  NA 135 139 136  K 4.1 4.2 3.5  CL 100* 104 100*  CO2 27 29 30   GLUCOSE 130* 91 93  BUN 10 8 6   CREATININE 0.59 0.63 0.61  CALCIUM 8.5* 8.3* 8.3*   Liver Function Tests:  Recent Labs  10/01/14 0555  AST 18  ALT 21  ALKPHOS 133*  BILITOT 0.4  PROT 6.0*  ALBUMIN 2.5*   No results for input(s): LIPASE, AMYLASE in the last 8760 hours. No results for input(s): AMMONIA in the last 8760 hours. CBC:  Recent Labs  09/30/14 2325  10/02/14 0630 10/03/14 0422 10/04/14 0440  WBC 8.8  < > 11.6* 12.8* 10.3  NEUTROABS 5.6  --   --   --   --   HGB 11.1*  < > 9.5* 9.0* 8.6*  HCT 34.6*  < > 28.5* 27.5* 26.5*  MCV 84.4  < > 84.1 84.4 83.6  PLT 332  < > 256 258 262  < > = values in this interval not displayed.  CBG:  Recent Labs  10/03/14 1809 10/04/14 0007 10/04/14 0611  GLUCAP 121* 106* 92     Assessment/Plan  Left hip fracture  S/P left total hip arthroplasty. Has follow up with Dr. Joni Fears on 10/10/14. Continue Xarelto 10 mg daily for DVT prophylaxis. Continue Norco 5/325 mg 1-2 tabs q6h prn pain. Will have her work with physical therapy and occupational therapy team to help with gait training and muscle strengthening exercises.fall precautions. Skin care. Encourage to be out of bed.   surgical incision infection Start doxycycline 100 mg bid for 5 days for now with florastor 250 mg bid x 2 weeks. Has f/u with orthopedics. Continue dressing change prn. Cbc with diff  Muscle spasm Post surgery, start robaxin 500 mg bid and  reassess  Normocytic anemia Hb 8.6 on discharge. Post op blood loss could be contributing to this. Check h&h  Protein calorie malnutrition Monitor weight. Monitor her po intake. Continue skin care. Pending dietary consult for addition of protein supplement  Constipation Stable. Continue colace 100 mg bid, miralax daily and prn dulcolax. Hydration encouraged  Rheumatoid arthritis continue prednisone 5 mg daily. No flare ups noted in joints   Goals of care: short term rehabilitation   Labs/tests ordered: cbc with diff  Family/ staff Communication: reviewed care plan with patient and nursing supervisor    Blanchie Serve, MD  [place] (Monday-Friday 8 am - 5 pm) [phone] (afterhours)

## 2014-10-17 ENCOUNTER — Non-Acute Institutional Stay (SKILLED_NURSING_FACILITY): Payer: Medicare Other | Admitting: Adult Health

## 2014-10-17 ENCOUNTER — Encounter: Payer: Self-pay | Admitting: Adult Health

## 2014-10-17 DIAGNOSIS — E43 Unspecified severe protein-calorie malnutrition: Secondary | ICD-10-CM

## 2014-10-17 DIAGNOSIS — S72012S Unspecified intracapsular fracture of left femur, sequela: Secondary | ICD-10-CM | POA: Diagnosis not present

## 2014-10-17 DIAGNOSIS — K5901 Slow transit constipation: Secondary | ICD-10-CM

## 2014-10-17 DIAGNOSIS — M069 Rheumatoid arthritis, unspecified: Secondary | ICD-10-CM

## 2014-10-17 DIAGNOSIS — D62 Acute posthemorrhagic anemia: Secondary | ICD-10-CM | POA: Diagnosis not present

## 2014-10-17 NOTE — Progress Notes (Addendum)
Patient ID: Felicia Gonzalez, female   DOB: [birth year], 74 y.o.   MRN: [id number]   10/17/2014  Facility:  N[facility]ing Home Location:  [place] Room Number: 1202-P LEVEL OF CARE:  SNF (31)   Chief Complaint  Patient presents with  . Discharge Note    Subcapital fracture of left hip S/P left total hip arthroplasty, anemia, rheumatoid arthritis, constipation and protein calorie malnutrition    HISTORY OF PRESENT ILLNESS:  This is a 74 year old female who is for discharge home with home health PT for ambulation, strengthening and balance; OT for self care skills.  DME: 3 in one commode, rolling walker  ( 5'1"  139 lbs). She has been admitted to [facility] on 10/04/14 from [facility]. She has PMH of asthma and rheumatoid arthritis. She had a fall and sustained a left hip fracture.  She had a left total hip arthroplasty on 10/01/14.   Patient was admitted to this facility for short-term rehabilitation after the patient's recent hospitalization.  Patient has completed SNF rehabilitation and therapy has cleared the patient for discharge.   PAST MEDICAL HISTORY:  Past Medical History  Diagnosis Date  . Arthritis     rheumatoid  . Asthma     CURRENT MEDICATIONS: Reviewed per MAR/see medication list  Allergies  Allergen Reactions  . Penicillins Other (See Comments)    unknown     REVIEW OF SYSTEMS:  GENERAL: no change in appetite, no fatigue, no weight changes, no fever, chills or weakness RESPIRATORY: no cough, SOB, DOE, wheezing, hemoptysis CARDIAC: no chest pain, or palpitations GI: no abdominal pain, diarrhea, constipation, heart burn, nausea or vomiting  PHYSICAL EXAMINATION  GENERAL: no acute distress, normal body habitus SKIN:  Left hip surgical site is dry, no redness, steri-strips intact NECK: supple, trachea midline, no neck masses, no thyroid tenderness, no thyromegaly LYMPHATICS: no LAN in the neck, no supraclavicular  LAN RESPIRATORY: breathing is even & unlabored, BS CTAB CARDIAC: RRR, no murmur,no extra heart sounds GI: abdomen soft, normal BS, no masses, no tenderness, no hepatomegaly, no splenomegaly EXTREMITIES:  Able to move 4 extremities PSYCHIATRIC: the patient is alert & oriented to person, affect & behavior appropriate  LABS/RADIOLOGY: Labs reviewed: 10/08/14  WBC 7.9 hemoglobin 8.9 hematocrit 27.1 MCV 82.6 sodium 137 potassium 5.1 glucose 81 BUN 8 creatinine 0.58 calcium 8.6 Basic Metabolic Panel:  Recent Labs  10/02/14 0630 10/03/14 0422 10/04/14 0440  NA 135 139 136  K 4.1 4.2 3.5  CL 100* 104 100*  CO2 27 29 30   GLUCOSE 130* 91 93  BUN 10 8 6   CREATININE 0.59 0.63 0.61  CALCIUM 8.5* 8.3* 8.3*   Liver Function Tests:  Recent Labs  10/01/14 0555  AST 18  ALT 21  ALKPHOS 133*  BILITOT 0.4  PROT 6.0*  ALBUMIN 2.5*   CBC:  Recent Labs  09/30/14 2325  10/02/14 0630 10/03/14 0422 10/04/14 0440  WBC 8.8  < > 11.6* 12.8* 10.3  NEUTROABS 5.6  --   --   --   --   HGB 11.1*  < > 9.5* 9.0* 8.6*  HCT 34.6*  < > 28.5* 27.5* 26.5*  MCV 84.4  < > 84.1 84.4 83.6  PLT 332  < > 256 258 262  < > = values in this interval not displayed.  CBG:  Recent Labs  10/03/14 1809 10/04/14 0007 10/04/14 0611  GLUCAP 121* 106* 92     Dg Pelvis  Portable  10/01/2014   CLINICAL DATA:  74 year old female status post left hip arthroplasty  EXAM: PORTABLE PELVIS 1-2 VIEWS; LEFT HIP (WITH PELVIS) 1 VIEW PORTABLE  COMPARISON:  None.  FINDINGS: Interval surgical changes of left total hip arthroplasty. There appears to be a lucency through the superior aspect of the greater trochanter. However, this lucency appears to extend beyond the bony margin is favored to represent subcutaneous emphysema mimicking a fracture line. The femoral head component is located with respect to the acetabular component on the cross-table lateral view. The visualized bony pelvis appears osteopenic. No pelvic fracture.  Expected subcutaneous emphysema.  IMPRESSION: 1. Left total hip arthroplasty. 2. New no definite hardware complication. Lucency overlying the superior aspect of the greater trochanter favored to reflect subcutaneous emphysema mimicking a fracture line.   Electronically Signed   By: Jacqulynn Cadet M.D.   On: 10/01/2014 19:37   Dg Chest Port 1 View  09/30/2014   CLINICAL DATA:  Hip fracture  EXAM: PORTABLE CHEST - 1 VIEW  COMPARISON:  None.  FINDINGS: Normal mediastinum and cardiac silhouette. Normal pulmonary vasculature. No evidence of effusion, infiltrate, or pneumothorax. No acute bony abnormality. There is a rounded sclerotic density measuring 3.4 by 2.1 cm projecting over the right first rib. This is not appear to be in the lung parenchyma and may be associated with a rib 4 of the skin.  IMPRESSION: No acute cardiopulmonary process.  Sclerotic lesion projecting over the right lung apex is indeterminate. This may associated with the bone and would potentially be palpable.   Electronically Signed   By: Suzy Bouchard M.D.   On: 09/30/2014 23:47   Dg Hip Port Unilat With Pelvis 1v Left  10/01/2014   CLINICAL DATA:  74 year old female status post left hip arthroplasty  EXAM: PORTABLE PELVIS 1-2 VIEWS; LEFT HIP (WITH PELVIS) 1 VIEW PORTABLE  COMPARISON:  None.  FINDINGS: Interval surgical changes of left total hip arthroplasty. There appears to be a lucency through the superior aspect of the greater trochanter. However, this lucency appears to extend beyond the bony margin is favored to represent subcutaneous emphysema mimicking a fracture line. The femoral head component is located with respect to the acetabular component on the cross-table lateral view. The visualized bony pelvis appears osteopenic. No pelvic fracture. Expected subcutaneous emphysema.  IMPRESSION: 1. Left total hip arthroplasty. 2. New no definite hardware complication. Lucency overlying the superior aspect of the greater trochanter  favored to reflect subcutaneous emphysema mimicking a fracture line.   Electronically Signed   By: Jacqulynn Cadet M.D.   On: 10/01/2014 19:37    ASSESSMENT/PLAN:  Left hip fracture S/P left total hip arthroplasty - for home health PT and OT; continue Norco 5/325 mg 1-2 tabs by mouth every 6 hours when necessary for pain; and change Robaxin to 500 mg 1 tab by mouth every 6 hours when necessary Anemia, acute blood loss - hgb 8.9; stable Rheumatoid arthritis - continue prednisone 5 mg by mouth daily Constipation - continue MiraLAX 17 g by mouth daily, Colace 100 mg by mouth twice a day and Dulcolax 10 mg 1 per rectum daily when necessary   protein calorie malnutrition, severe - albumin 2.5; continue supplementation    I have filled out patient's discharge paperwork and written prescriptions.  Patient will receive home health PT and OT.  DME provided:  3 in one commode, rolling walker  ( 5'1"  139 lbs)  Total discharge time: Greater than 30 minutes  Discharge time  involved coordination of the discharge process with Education officer, museum, nursing staff and therapy department. Medical justification for home health services/DME verified.    [facility], NP [employer] [phone]

## 2014-10-30 ENCOUNTER — Ambulatory Visit (HOSPITAL_COMMUNITY)
Admission: RE | Admit: 2014-10-30 | Discharge: 2014-10-30 | Disposition: A | Discharge status: Home / Self Care | Payer: Medicare Other | Source: Ambulatory Visit | Attending: Orthopaedic Surgery | Admitting: Orthopaedic Surgery

## 2014-10-30 ENCOUNTER — Other Ambulatory Visit (HOSPITAL_COMMUNITY): Payer: Self-pay | Admitting: Orthopaedic Surgery

## 2014-10-30 DIAGNOSIS — R609 Edema, unspecified: Secondary | ICD-10-CM

## 2014-10-30 DIAGNOSIS — R600 Localized edema: Secondary | ICD-10-CM | POA: Diagnosis not present

## 2014-10-30 NOTE — Progress Notes (Signed)
*  PRELIMINARY RESULTS* Vascular Ultrasound Left lower extremity venous duplex has been completed.  Preliminary findings: negative for DVT in visualized veins.   Landry Mellow, RDMS, RVT  10/30/2014, 3:24 PM

## 2014-11-28 ENCOUNTER — Other Ambulatory Visit: Payer: Self-pay | Admitting: Family Medicine

## 2014-11-28 DIAGNOSIS — M61411 Other calcification of muscle, right shoulder: Secondary | ICD-10-CM

## 2014-12-04 ENCOUNTER — Ambulatory Visit
Admission: RE | Admit: 2014-12-04 | Discharge: 2014-12-04 | Disposition: A | Discharge status: Home / Self Care | Payer: Medicare Other | Source: Ambulatory Visit | Attending: Family Medicine | Admitting: Family Medicine

## 2014-12-04 DIAGNOSIS — M61411 Other calcification of muscle, right shoulder: Secondary | ICD-10-CM

## 2014-12-11 ENCOUNTER — Other Ambulatory Visit (HOSPITAL_COMMUNITY): Payer: Self-pay | Admitting: Orthopaedic Surgery

## 2014-12-11 DIAGNOSIS — R2231 Localized swelling, mass and lump, right upper limb: Secondary | ICD-10-CM

## 2014-12-19 ENCOUNTER — Other Ambulatory Visit: Payer: Self-pay | Admitting: Radiology

## 2014-12-19 ENCOUNTER — Other Ambulatory Visit: Payer: Self-pay | Admitting: Physician Assistant

## 2014-12-20 ENCOUNTER — Encounter (HOSPITAL_COMMUNITY): Payer: Self-pay

## 2014-12-20 ENCOUNTER — Ambulatory Visit (HOSPITAL_COMMUNITY)
Admission: RE | Admit: 2014-12-20 | Discharge: 2014-12-20 | Disposition: A | Discharge status: Home / Self Care | Payer: Medicare Other | Source: Ambulatory Visit | Attending: Orthopaedic Surgery | Admitting: Orthopaedic Surgery

## 2014-12-20 DIAGNOSIS — R2231 Localized swelling, mass and lump, right upper limb: Secondary | ICD-10-CM | POA: Diagnosis not present

## 2014-12-20 LAB — PROTIME-INR
INR: 1 (ref 0.00–1.49)
Prothrombin Time: 13.4 seconds (ref 11.6–15.2)

## 2014-12-20 LAB — CBC
HCT: 34.8 % — ABNORMAL LOW (ref 36.0–46.0)
HEMOGLOBIN: 11.3 g/dL — AB (ref 12.0–15.0)
MCH: 27.2 pg (ref 26.0–34.0)
MCHC: 32.5 g/dL (ref 30.0–36.0)
MCV: 83.9 fL (ref 78.0–100.0)
Platelets: 417 10*3/uL — ABNORMAL HIGH (ref 150–400)
RBC: 4.15 MIL/uL (ref 3.87–5.11)
RDW: 15.3 % (ref 11.5–15.5)
WBC: 11.2 10*3/uL — AB (ref 4.0–10.5)

## 2014-12-20 LAB — APTT: aPTT: 33 seconds (ref 24–37)

## 2014-12-20 MED ORDER — MIDAZOLAM HCL 2 MG/2ML IJ SOLN
INTRAMUSCULAR | Status: AC | PRN
  Administered 2014-12-20: 1 mg via INTRAVENOUS

## 2014-12-20 MED ORDER — FENTANYL CITRATE (PF) 100 MCG/2ML IJ SOLN
INTRAMUSCULAR | Status: AC
  Filled 2014-12-20: qty 2

## 2014-12-20 MED ORDER — SODIUM CHLORIDE 0.9 % IV SOLN
INTRAVENOUS | Status: DC
  Administered 2014-12-20: 10:00:00 via INTRAVENOUS

## 2014-12-20 MED ORDER — HYDROCODONE-ACETAMINOPHEN 5-325 MG PO TABS
1.0000 | ORAL_TABLET | ORAL | Status: DC | PRN
  Filled 2014-12-20: qty 2

## 2014-12-20 MED ORDER — LIDOCAINE HCL 1 % IJ SOLN
INTRAMUSCULAR | Status: AC
  Filled 2014-12-20: qty 20

## 2014-12-20 MED ORDER — FENTANYL CITRATE (PF) 100 MCG/2ML IJ SOLN
INTRAMUSCULAR | Status: AC | PRN
  Administered 2014-12-20: 50 ug via INTRAVENOUS

## 2014-12-20 MED ORDER — MIDAZOLAM HCL 2 MG/2ML IJ SOLN
INTRAMUSCULAR | Status: AC
Start: 2014-12-20 — End: 2014-12-20
  Filled 2014-12-20: qty 2

## 2014-12-20 NOTE — H&P (Signed)
Chief Complaint: Patient was seen in consultation today for image guided biopsy of a right shoulder mass at the request of Garald Balding  Referring Physician(s): Whitfield,Peter W  History of Present Illness: Felicia Gonzalez is a 74 y.o. female who developed right shoulder pain while she was doing physical therapy after her left hip replacement.  She states it hurts to move and she feels her range of motion is diminished because of it.  CT scan done on 12/04/14 shows a soft tissue density with speckled calcification measuring 3.9 by 1.2 by 3.3 cm in the vicinity of the supraserratus bursa. This could reflect chronic bursitis and calcific synovitis in the bursa. A soft tissue mass in the space between the serratus anterior, posterior, and levator scapulae muscle is likewise a possibility, with some ossibilities including synovial osteochondromatosis, soft tissue mass such as hemangioma or less likely sarcoma, hyperparathyroidism associated lesion, myositis ossificans, and other conditions  We are asked to do an image guided biopsy of this mass today.  Past Medical History  Diagnosis Date  . Arthritis     rheumatoid  . Asthma     Past Surgical History  Procedure Laterality Date  . No past surgeries    . Total hip arthroplasty Left 10/01/2014    Procedure: TOTAL HIP ARTHROPLASTY;  Surgeon: Garald Balding, MD;  Location: [place];  Service: Orthopedics;  Laterality: Left;    Allergies: Penicillins  Medications: Prior to Admission medications   Medication Sig Start Date End Date Taking? Authorizing Provider  acetaminophen (TYLENOL) 325 MG tablet Take 650 mg by mouth every 6 (six) hours as needed for moderate pain.   Yes Historical Provider, MD  hydroxychloroquine (PLAQUENIL) 200 MG tablet Take 200 mg by mouth daily.   Yes Historical Provider, MD  Multiple Vitamin (MULTI-VITAMIN PO) Take 1 tablet by mouth daily.   Yes Historical Provider, MD  Polyethyl Glycol-Propyl Glycol  (SYSTANE OP) Place 1 drop into the left eye at bedtime.   Yes Historical Provider, MD  predniSONE (DELTASONE) 5 MG tablet Take 1 tablet (5 mg total) by mouth daily with breakfast. 10/04/14  Yes Albertine Patricia, MD  saccharomyces boulardii (FLORASTOR) 250 MG capsule Take 250 mg by mouth 2 (two) times daily.   Yes Historical Provider, MD  travoprost, benzalkonium, (TRAVATAN) 0.004 % ophthalmic solution Place 1 drop into the right eye at bedtime.   Yes Historical Provider, MD  bisacodyl (DULCOLAX) 10 MG suppository Place 1 suppository (10 mg total) rectally daily as needed for moderate constipation. Patient not taking: Reported on 12/17/2014 10/04/14   Silver Huguenin Elgergawy, MD  docusate sodium (COLACE) 100 MG capsule Take 1 capsule (100 mg total) by mouth 2 (two) times daily. Patient not taking: Reported on 12/17/2014 10/04/14   Silver Huguenin Elgergawy, MD  HYDROcodone-acetaminophen (NORCO/VICODIN) 5-325 MG per tablet Take 1-2 tablets by mouth every 6 (six) hours as needed for moderate pain. Patient not taking: Reported on 12/17/2014 10/03/14   Mike Craze Petrarca, PA-C  polyethylene glycol (MIRALAX / GLYCOLAX) packet Take 17 g by mouth daily as needed for mild constipation. Patient not taking: Reported on 12/17/2014 10/04/14   Albertine Patricia, MD  rivaroxaban (XARELTO) 10 MG TABS tablet Take 1 tablet (10 mg total) by mouth daily with breakfast. Patient not taking: Reported on 12/17/2014 10/03/14   Cherylann Ratel, PA-C     Family History  Problem Relation Age of Onset  . Hypertension Father     History   Social  History  . Marital Status: Married    Spouse Name: N/A  . Number of Children: N/A  . Years of Education: N/A   Social History Main Topics  . Smoking status: Never Smoker   . Smokeless tobacco: Not on file  . Alcohol Use: No  . Drug Use: No  . Sexual Activity: Not on file   Other Topics Concern  . None   Social History Narrative      Review of Systems  Constitutional: Positive for  activity change. Negative for fever and fatigue.  HENT: Negative.   Respiratory: Negative for cough, chest tightness and shortness of breath.   Cardiovascular: Negative for chest pain.  Gastrointestinal: Negative for nausea, vomiting and abdominal pain.  Genitourinary: Negative.   Musculoskeletal: Positive for myalgias and arthralgias.  Skin: Negative.   Psychiatric/Behavioral: Negative.     Vital Signs: BP 158/87 mmHg  Pulse 114  Temp(Src) 99.3 F (37.4 C)  Resp 18  Ht 5\' 1"  (1.549 m)  Wt 136 lb (61.689 kg)  BMI 25.71 kg/m2  SpO2 100%  Physical Exam  Constitutional: She is oriented to person, place, and time. She appears well-developed and well-nourished.  HENT:  Head: Atraumatic.  Eyes: EOM are normal. Pupils are equal, round, and reactive to light.  Neck: Normal range of motion. Neck supple.  Cardiovascular: Normal rate, regular rhythm and normal heart sounds.   No murmur heard. Pulmonary/Chest: Effort normal and breath sounds normal.  Abdominal: Soft. Bowel sounds are normal. She exhibits no distension.  Musculoskeletal:  Diminished ROM of the right shoulder. Some pain with palpation. I am unable to palpate the mass  Neurological: She is alert and oriented to person, place, and time.  Skin: Skin is warm and dry.  Psychiatric: She has a normal mood and affect. Her behavior is normal. Judgment and thought content normal.  Vitals reviewed.   Mallampati Score:  MD Evaluation Airway: WNL Heart: WNL Abdomen: WNL Chest/ Lungs: WNL ASA  Classification: 2 Mallampati/Airway Score: One  Imaging: Ct Shoulder Right Wo Contrast  12/04/2014   CLINICAL DATA:  Calcification projecting over the right first rib.  EXAM: CT OF THE RIGHT SHOULDER WITHOUT CONTRAST  TECHNIQUE: Multidetector CT imaging was performed according to the standard protocol. Multiplanar CT image reconstructions were also generated.  COMPARISON:  None.  FINDINGS: Interposed deep to the right serrated is  posterior and the right serratus anterior muscles there is a 3.9 by 1.2 by 3.3 cm soft tissue mass slightly hyperdense to muscle with dense scattered coarse calcifications especially along its margins.  There is degenerative glenohumeral arthropathy. Subcortical lucency in a considerable articular segment of the humeral head is company by a small step-off in the cortex of the humeral head suggesting slight collapse, raising the possibility of avascular necrosis some cortically in the right humeral head.  In the subscapular recess, there is some faint calcifications possibly along synovitis or possibly representing free osteochondral fragments in the recess, as shown on images 46-51 of series 201.  No intrapulmonary nodule in the visualized portion of the right upper lobe no pathologic axillary adenopathy.  IMPRESSION: 1. Soft tissue density with speckled calcification measuring 3.9 by 1.2 by 3.3 cm in the vicinity of the supraserratus bursa. This could reflect chronic bursitis and calcific synovitis in the bursa. A soft tissue mass in the space between the serratus anterior, posterior, and levator scapulae muscle is likewise a possibility, with some possibilities including synovial osteochondromatosis, soft tissue mass such as hemangioma or less likely  sarcoma, hyperparathyroidism associated lesion, myositis ossificans, and other conditions. Correlate with shoulder symptoms and chronicity. Depending on symptoms, imaging follow up or surgical management may be warranted. 2. There is also some faint calcification in the subscapular bursa suggesting free osteochondral fragments in the shoulder joint versus calcified synovial tissue. 3. Suspicion for mild avascular necrosis of the right humeral head, with a slight step-off along the articular surface of the humeral head potentially from mild focal collapse.   Electronically Signed   By: Van Clines M.D.   On: 12/04/2014 15:09    Labs:  CBC:  Recent Labs   10/01/14 0555 10/01/14 1807 10/02/14 0630 10/03/14 0422 10/04/14 0440  WBC 6.5  --  11.6* 12.8* 10.3  HGB 10.3* 8.8* 9.5* 9.0* 8.6*  HCT 32.6* 26.0* 28.5* 27.5* 26.5*  PLT 336  --  256 258 262    COAGS:  Recent Labs  09/30/14 2325  INR 1.00    BMP:  Recent Labs  10/01/14 0555 10/01/14 1807 10/02/14 0630 10/03/14 0422 10/04/14 0440  NA 137 134* 135 139 136  K 4.2 4.6 4.1 4.2 3.5  CL 101  --  100* 104 100*  CO2 26  --  27 29 30   GLUCOSE 114*  --  130* 91 93  BUN 8  --  10 8 6   CALCIUM 8.9  --  8.5* 8.3* 8.3*  CREATININE 0.62  --  0.59 0.63 0.61  GFRNONAA >60  --  >60 >60 >60  GFRAA >60  --  >60 >60 >60    LIVER FUNCTION TESTS:  Recent Labs  10/01/14 0555  BILITOT 0.4  AST 18  ALT 21  ALKPHOS 133*  PROT 6.0*  ALBUMIN 2.5*    TUMOR MARKERS: No results for input(s): AFPTM, CEA, CA199, CHROMGRNA in the last 8760 hours.  Assessment and Plan:  Right Shoulder mass  Dr. Kathlene Cote has reviewed the patients images and her chart.  Will proceed with image guided biopsy today by Dr. Kathlene Cote  Risks and Benefits discussed with the patient including, but not limited to bleeding, infection, damage to adjacent structures or low yield requiring additional tests. All of the patient's questions were answered, patient is agreeable to proceed. Consent signed and in chart.  Thank you for this interesting consult.  I greatly enjoyed meeting Felicia Gonzalez and look forward to participating in their care.  A copy of this report was sent to the requesting provider on this date.  Signed: Murrell Redden PA-C 12/20/2014, 11:02 AM   I spent a total of  30 Minutes   in face to face in clinical consultation, greater than 50% of which was counseling/coordinating care for image guided biopsy of right shoulder mass.

## 2014-12-20 NOTE — Discharge Instructions (Signed)
Remove bandaid tomorrow and shower. Call for any signs & symptoms fo infection: fever, redness, drainage or swelling.

## 2014-12-20 NOTE — Procedures (Signed)
Interventional Radiology Procedure Note  Procedure:  CT guided core biopsy of right periscapular/shoulder region lesion   Complications:  None  Estimated Blood Loss: <10 mL  Findings:  3 cm densely calcified lesion medial to upper left scapula sampled with 18 G core biopsy x 4 via 17 G trocar needle.  Samples sent in formalin.   Venetia Night. Kathlene Cote, M.D Pager:  [phone]

## 2015-02-06 ENCOUNTER — Telehealth: Payer: Self-pay | Admitting: Hematology

## 2015-02-06 NOTE — Telephone Encounter (Signed)
New patient appt-s/w patient and gave np appt for 09/19 with an arrival time of 1:45 w/Dr, Irene Limbo Referring Dr. Joni Fears Dx-fibrotic lesion on chest ct    Referral information scanned into system

## 2015-02-17 ENCOUNTER — Other Ambulatory Visit: Payer: Self-pay | Admitting: Hematology

## 2015-02-17 ENCOUNTER — Other Ambulatory Visit (HOSPITAL_BASED_OUTPATIENT_CLINIC_OR_DEPARTMENT_OTHER): Payer: Medicare Other

## 2015-02-17 ENCOUNTER — Telehealth: Payer: Self-pay | Admitting: Hematology

## 2015-02-17 ENCOUNTER — Ambulatory Visit (HOSPITAL_BASED_OUTPATIENT_CLINIC_OR_DEPARTMENT_OTHER): Payer: Medicare Other | Admitting: Hematology

## 2015-02-17 VITALS — BP 164/70 | HR 111 | Temp 98.8°F | Resp 18 | Ht 61.0 in | Wt 141.0 lb

## 2015-02-17 DIAGNOSIS — R222 Localized swelling, mass and lump, trunk: Secondary | ICD-10-CM

## 2015-02-17 DIAGNOSIS — R229 Localized swelling, mass and lump, unspecified: Secondary | ICD-10-CM | POA: Diagnosis not present

## 2015-02-17 LAB — CBC & DIFF AND RETIC
BASO%: 0.6 % (ref 0.0–2.0)
BASOS ABS: 0.1 10*3/uL (ref 0.0–0.1)
EOS ABS: 0 10*3/uL (ref 0.0–0.5)
EOS%: 0.4 % (ref 0.0–7.0)
HCT: 32.4 % — ABNORMAL LOW (ref 34.8–46.6)
HEMOGLOBIN: 10.5 g/dL — AB (ref 11.6–15.9)
IMMATURE RETIC FRACT: 6.7 % (ref 1.60–10.00)
LYMPH%: 9 % — ABNORMAL LOW (ref 14.0–49.7)
MCH: 26.1 pg (ref 25.1–34.0)
MCHC: 32.3 g/dL (ref 31.5–36.0)
MCV: 80.8 fL (ref 79.5–101.0)
MONO#: 0.5 10*3/uL (ref 0.1–0.9)
MONO%: 5.2 % (ref 0.0–14.0)
NEUT#: 8.3 10*3/uL — ABNORMAL HIGH (ref 1.5–6.5)
NEUT%: 84.8 % — ABNORMAL HIGH (ref 38.4–76.8)
Platelets: 494 10*3/uL — ABNORMAL HIGH (ref 145–400)
RBC: 4.01 10*6/uL (ref 3.70–5.45)
RDW: 15.4 % — ABNORMAL HIGH (ref 11.2–14.5)
RETIC %: 1.74 % (ref 0.70–2.10)
Retic Ct Abs: 69.77 10*3/uL (ref 33.70–90.70)
WBC: 9.8 10*3/uL (ref 3.9–10.3)
lymph#: 0.9 10*3/uL (ref 0.9–3.3)
nRBC: 0 % (ref 0–0)

## 2015-02-17 LAB — COMPREHENSIVE METABOLIC PANEL (CC13)
ALBUMIN: 3.1 g/dL — AB (ref 3.5–5.0)
ALK PHOS: 134 U/L (ref 40–150)
ALT: 51 U/L (ref 0–55)
AST: 37 U/L — AB (ref 5–34)
Anion Gap: 8 mEq/L (ref 3–11)
BUN: 14.2 mg/dL (ref 7.0–26.0)
CO2: 28 mEq/L (ref 22–29)
Calcium: 9.5 mg/dL (ref 8.4–10.4)
Chloride: 102 mEq/L (ref 98–109)
Creatinine: 0.7 mg/dL (ref 0.6–1.1)
EGFR: 80 mL/min/{1.73_m2} — ABNORMAL LOW (ref >90)
GLUCOSE: 170 mg/dL — AB (ref 70–140)
Potassium: 4.5 mEq/L (ref 3.5–5.1)
Sodium: 139 mEq/L (ref 136–145)
Total Protein: 7.5 g/dL (ref 6.4–8.3)

## 2015-02-17 NOTE — Telephone Encounter (Signed)
Pt confirmed labs/ov per 09/19 POF, gave pt AVS and Calendar... KJ °

## 2015-02-17 NOTE — Progress Notes (Signed)
Felicia Gonzalez Kitchen    HEMATOLOGY/ONCOLOGY CONSULTATION NOTE  Date of Service: 02/17/2015  Patient Care Team: Vernie Shanks, MD as PCP - General (Family Medicine)  CHIEF COMPLAINTS/PURPOSE OF CONSULTATION:  Calcified soft tissue lesion on the chest  HISTORY OF PRESENTING ILLNESS:   Felicia Gonzalez is a wonderful 74 y.o. female who has been referred to [place] by Dr Joni Fears and .Anthoney Harada, MD for evaluation and management of an incidental noted calcified soft tissue lesion noted on the CT scan of the chest.  Ms Labuda has a history of significant rheumatoid arthritis (CCP Ab positive) for 20 years treated with prednisone on and off and with other alternative medicines including turmeric tablets, aloe vera and ginger and and now recently started on plaquenil for about 2 months. Patient follows with Dr. Estanislado Pandy for rheumatology management. She also has a history of some anemia and controlled glaucoma. She also reports that she was diagnosed with osteoporosis but has been hesitant to consider bisphosphonate therapy and has been in discussions with her rheumatologist about this.  She had a left hip total arthroplasty in May 2016 by Dr. Durward Fortes for a displaced subcapital fracture and notes that that has helped her left hip pain and mobility significantly.  On follow-up with her orthopedic doctor she noted some right shoulder pain which led to an x-ray of her right shoulder which showed evidence of possible avascular necrosis with a crescent sign of the humeral head and some ectopic calcification further medially in the area of her chest wall. She was subsequently seen by her primary care physician Dr.Wong and had a CT of her chest which showed a soft tissue density with speckled calcification measuring 3.9 cm x 1.2 cm x 3.3 cm in the vicinity of the supraserratus bursa.  Patient had an attempt of the needle biopsy on 12/20/2014 which showed a dense hyaline/fibrotic tissue with calcification. It is  uncertain if this captured the actual pathology of this mass.  She was referred to [place] for further evaluation of this mass and was today for further evaluation and management of this. She notes that there is some generalized discomfort over the area of the mass. We reviewed the CT images on the computer and discussed the possibilities involved. It has been nearly 2 months since her last radiographic evaluation of this mass and we made a decision to get an MRI of the soft tissue mass to look at interval changes in the size of the mass as well as to study the soft tissue components in more detail to determine if it might warrant an excisional biopsy to have a better understanding of the etiology.   MEDICAL HISTORY:  Past Medical History  Diagnosis Date  . Arthritis     rheumatoid  . Asthma   . Osteoporosis   . Glaucoma   . Anemia     SURGICAL HISTORY: Past Surgical History  Procedure Laterality Date  . No past surgeries    . Total hip arthroplasty Left 10/01/2014    Procedure: TOTAL HIP ARTHROPLASTY;  Surgeon: Garald Balding, MD;  Location: [place];  Service: Orthopedics;  Laterality: Left;    SOCIAL HISTORY: Social History   Social History  . Marital Status: Married    Spouse Name: N/A  . Number of Children: N/A  . Years of Education: N/A   Occupational History  . Not on file.   Social History Main Topics  . Smoking status: Never Smoker   . Smokeless tobacco: Not on file  .  Alcohol Use: No  . Drug Use: No  . Sexual Activity: Not on file   Other Topics Concern  . Not on file   Social History Narrative    FAMILY HISTORY: Family History  Problem Relation Age of Onset  . Hypertension Father     ALLERGIES:  is allergic to sulfa antibiotics and penicillins.  MEDICATIONS:  Current Outpatient Prescriptions  Medication Sig Dispense Refill  . hydroxychloroquine (PLAQUENIL) 200 MG tablet Take 200 mg by mouth daily.  0  . Multiple Vitamin (MULTIVITAMIN) capsule Take 1  capsule by mouth daily.    Felicia Gonzalez Kitchen omega-3 acid ethyl esters (LOVAZA) 1 G capsule Take by mouth 2 (two) times daily.    . predniSONE (DELTASONE) 5 MG tablet Take 10 mg by mouth every morning.   0  . saccharomyces boulardii (FLORASTOR) 250 MG capsule Take 250 mg by mouth 2 (two) times daily.    . travoprost, benzalkonium, (TRAVATAN) 0.004 % ophthalmic solution 1 drop at bedtime.    . predniSONE (DELTASONE) 1 MG tablet Take 4 mg by mouth daily.  2   No current facility-administered medications for this visit.    REVIEW OF SYSTEMS:    10 Point review of Systems was done is negative except as noted above.  PHYSICAL EXAMINATION: ECOG PERFORMANCE STATUS: 1 - Symptomatic but completely ambulatory  . Filed Vitals:   02/17/15 1417  Height: 5\' 1"  (1.549 m)  Weight: 141 lb (63.957 kg)   Filed Weights   02/17/15 1417  Weight: 141 lb (63.957 kg)   .Body mass index is 26.66 kg/(m^2).  GENERAL:alert, in no acute distress and comfortable SKIN: skin color, texture, turgor are normal, no rashes or significant lesions EYES: normal, conjunctiva are pink and non-injected, sclera clear OROPHARYNX:no exudate, no erythema and lips, buccal mucosa, and tongue normal  NECK: supple, no JVD, thyroid normal size, non-tender, without nodularity LYMPH:  no palpable lymphadenopathy in the cervical, axillary or inguinal LUNGS: clear to auscultation with normal respiratory effort HEART: regular rate & rhythm,  no murmurs and no lower extremity edema ABDOMEN: abdomen soft, non-tender, normoactive bowel sounds  Musculoskeletal: no cyanosis of digits and no clubbing, some fullness along the medial side of the right scapula in the paraspinal area. PSYCH: alert & oriented x 3 with fluent speech NEURO: no focal motor/sensory deficits  LABORATORY DATA:  I have reviewed the data as listed  . CBC Latest Ref Rng 02/17/2015 12/20/2014 10/04/2014  WBC 3.9 - 10.3 10e3/uL 9.8 11.2(H) 10.3  Hemoglobin 11.6 - 15.9 g/dL 10.5(L)  11.3(L) 8.6(L)  Hematocrit 34.8 - 46.6 % 32.4(L) 34.8(L) 26.5(L)  Platelets 145 - 400 10e3/uL 494(H) 417(H) 262    . CMP Latest Ref Rng 02/17/2015 10/04/2014 10/03/2014  Glucose 70 - 140 mg/dl 170(H) 93 91  BUN 7.0 - 26.0 mg/dL 14.2 6 8   Creatinine 0.6 - 1.1 mg/dL 0.7 0.61 0.63  Sodium 136 - 145 mEq/L 139 136 139  Potassium 3.5 - 5.1 mEq/L 4.5 3.5 4.2  Chloride 101 - 111 mmol/L - 100(L) 104  CO2 22 - 29 mEq/L 28 30 29   Calcium 8.4 - 10.4 mg/dL 9.5 8.3(L) 8.3(L)  Total Protein 6.4 - 8.3 g/dL 7.5 - -  Total Bilirubin 0.20 - 1.20 mg/dL <0.20 - -  Alkaline Phos 40 - 150 U/L 134 - -  AST 5 - 34 U/L 37(H) - -  ALT 0 - 55 U/L 51 - -     RADIOGRAPHIC STUDIES: CT rt shoulder 12/03/2013: IMPRESSION: 1. Soft tissue  density with speckled calcification measuring 3.9 by 1.2 by 3.3 cm in the vicinity of the supraserratus bursa. This could reflect chronic bursitis and calcific synovitis in the bursa. A soft tissue mass in the space between the serratus anterior, posterior, and levator scapulae muscle is likewise a possibility, with some possibilities including synovial osteochondromatosis, soft tissue mass such as hemangioma or less likely sarcoma, hyperparathyroidism associated lesion, myositis ossificans, and other conditions. Correlate with shoulder symptoms and chronicity. Depending on symptoms, imaging follow up or surgical management may be warranted. 2. There is also some faint calcification in the subscapular bursa suggesting free osteochondral fragments in the shoulder joint versus calcified synovial tissue. 3. Suspicion for mild avascular necrosis of the right humeral head, with a slight step-off along the articular surface of the humeral head potentially from mild focal collapse.   ASSESSMENT & PLAN:   74 year old female with  #1 Right posterior chest wall/paraspinal calcified soft tissue mass of unclear etiology. The needle biopsy of her mass on 12/20/2014 was difficult due to the  calcification and was nondiagnostic showing only hilar denies fibrotic tissue with calcification. It is unclear if this demonstrates the actual pathology or just a reactive change in the lesion. The calcification certainly suggests a somewhat slower growing process but not necessarily. The differential diagnosis at this point is fairly broad including a calcified chronically inflamed bursa, calcified hematoma, ectopic muscle calcification, rheumatoid nodule with calcification etc. I can't however cannot rule out the possibility of a neoplasm in the area that might present similarly including certain variance of giant cell tumor, soft tissue sarcoma with calcifications etc Plan  -Would get MRI of the chest or T-spine with paraspinal views to better study the right posterior paraspinal chest wall mass to look at interval change in size as well as to better study it's soft tissue components. -If the mass is unchanged and the MRI findings are definitive of a benign etiology we might consider interval monitoring with imaging depending on the patient's wishes. -If there is increase in size or MRI findings suggestive of more aggressive etiology we would recommend having the mass excisional removed for definitive pathological diagnosis. The incision for the excisional biopsy would need to be longitudinal to langerhans lines to allow for small re-excision field if a sarcoma is diagnosed. -I will have Dr. Durward Fortes weigh in regarding his thoughts about this and whether he would want to do the surgery or have [place] refer the patient to [place] surgery if needed . -The CT scan of the chest was reviewed in details with the patient and her husband.we also discussed the possibilities and the current course of action and they were in agreement with this . -I will have the patient get the MRI and then see me back in clinic to determine the next steps.  #2 Rheumatoid arthritis currently on plaquenil and prednisone -Continue  follow-up with Dr. Estanislado Pandy from rheumatology for ongoing treatments .  #3 likely osteoporosis as per patient we do not have access to her DEXA scan results at this time . Her prednisone use is certainly a risk factor for this as is her age. She has had a left subcapital femur fracture already. -Would need aggressive management of her osteoporosis as per her primary care physician and rheumatology including appropriate vitamin D and calcium replacement as well as the use of bisphosphonates as indicated .  Return to clinic with Dr. Irene Limbo in 2 weeks with MRI of the soft tissue mass.   All of the  patients questions were answered to her apparent satisfaction. The patient knows to call the clinic with any problems, questions or concerns.  I spent 40 minutes counseling the patient face to face. The total time spent in the appointment was 50 minutes and more than 50% was on counseling and direct patient cares.    Sullivan Lone MD [place] AAHIVMS [facility] Hematology/Oncology Physician [facility]  (Office):       [phone] (Work cell):  [phone] (Fax):           [phone]  02/17/2015 2:35 PM

## 2015-02-18 LAB — SEDIMENTATION RATE: Sed Rate: 67 mm/hr — ABNORMAL HIGH (ref 0–30)

## 2015-02-18 LAB — C-REACTIVE PROTEIN: CRP: 5.9 mg/dL — ABNORMAL HIGH (ref <0.60)

## 2015-02-28 ENCOUNTER — Telehealth: Payer: Self-pay | Admitting: Hematology

## 2015-02-28 NOTE — Telephone Encounter (Signed)
pt left voicemail to call back-no reason given-cld pt back and left a message to call us back-adv pt had a appt on 10/3 @ 1:30

## 2015-03-03 ENCOUNTER — Ambulatory Visit (HOSPITAL_COMMUNITY)
Admission: RE | Admit: 2015-03-03 | Discharge: 2015-03-03 | Disposition: A | Discharge status: Home / Self Care | Payer: Medicare Other | Source: Ambulatory Visit | Attending: Hematology | Admitting: Hematology

## 2015-03-03 ENCOUNTER — Encounter: Payer: Self-pay | Admitting: Hematology

## 2015-03-03 ENCOUNTER — Telehealth: Payer: Self-pay | Admitting: Hematology

## 2015-03-03 ENCOUNTER — Other Ambulatory Visit: Payer: Self-pay | Admitting: Hematology

## 2015-03-03 ENCOUNTER — Telehealth: Payer: Self-pay | Admitting: *Deleted

## 2015-03-03 ENCOUNTER — Encounter: Payer: Medicare Other | Admitting: Hematology

## 2015-03-03 DIAGNOSIS — R222 Localized swelling, mass and lump, trunk: Secondary | ICD-10-CM

## 2015-03-03 NOTE — Telephone Encounter (Signed)
per pof to sch pt appt-cld & spoke to pt and gave pt time & dqate of r/s appt

## 2015-03-03 NOTE — Telephone Encounter (Signed)
PT. WILL HAVE MRI ON 03/10/15

## 2015-03-03 NOTE — Progress Notes (Signed)
Per Dr. Irene Limbo, encounter cancelled due to not having MRI prior to visit.  Pt to reschedule in 1 week.  Schedulers notified.

## 2015-03-06 ENCOUNTER — Other Ambulatory Visit: Payer: Self-pay | Admitting: Hematology

## 2015-03-06 DIAGNOSIS — M7989 Other specified soft tissue disorders: Secondary | ICD-10-CM

## 2015-03-06 DIAGNOSIS — R222 Localized swelling, mass and lump, trunk: Secondary | ICD-10-CM

## 2015-03-07 ENCOUNTER — Other Ambulatory Visit: Payer: Self-pay | Admitting: *Deleted

## 2015-03-07 ENCOUNTER — Telehealth: Payer: Self-pay | Admitting: Hematology

## 2015-03-07 NOTE — Telephone Encounter (Signed)
s.w. pt and advised on 10.14 appt moved to 10.21 due to mri sched after....pt ok and aware of new d.t

## 2015-03-10 ENCOUNTER — Ambulatory Visit (HOSPITAL_COMMUNITY): Admission: RE | Admit: 2015-03-10 | Payer: Medicare Other | Source: Ambulatory Visit

## 2015-03-11 ENCOUNTER — Ambulatory Visit: Payer: Medicare Other | Admitting: Hematology

## 2015-03-14 ENCOUNTER — Ambulatory Visit: Payer: Medicare Other | Admitting: Hematology

## 2015-03-17 ENCOUNTER — Ambulatory Visit (HOSPITAL_COMMUNITY): Admission: RE | Admit: 2015-03-17 | Payer: Medicare Other | Source: Ambulatory Visit

## 2015-03-18 ENCOUNTER — Other Ambulatory Visit: Payer: Self-pay

## 2015-03-18 ENCOUNTER — Other Ambulatory Visit: Payer: Self-pay | Admitting: *Deleted

## 2015-03-18 DIAGNOSIS — Z1231 Encounter for screening mammogram for malignant neoplasm of breast: Secondary | ICD-10-CM

## 2015-03-19 ENCOUNTER — Telehealth: Payer: Self-pay | Admitting: Hematology

## 2015-03-19 ENCOUNTER — Other Ambulatory Visit: Payer: Self-pay | Admitting: *Deleted

## 2015-03-19 NOTE — Telephone Encounter (Signed)
Called patient and her appointment has been rescheduled

## 2015-03-21 ENCOUNTER — Ambulatory Visit (HOSPITAL_COMMUNITY)
Admission: RE | Admit: 2015-03-21 | Discharge: 2015-03-21 | Disposition: A | Discharge status: Home / Self Care | Payer: Medicare Other | Source: Ambulatory Visit | Attending: Hematology | Admitting: Hematology

## 2015-03-21 ENCOUNTER — Ambulatory Visit: Payer: Medicare Other | Admitting: Hematology

## 2015-03-21 DIAGNOSIS — M19019 Primary osteoarthritis, unspecified shoulder: Secondary | ICD-10-CM | POA: Insufficient documentation

## 2015-03-21 DIAGNOSIS — R222 Localized swelling, mass and lump, trunk: Secondary | ICD-10-CM | POA: Insufficient documentation

## 2015-03-21 DIAGNOSIS — M069 Rheumatoid arthritis, unspecified: Secondary | ICD-10-CM | POA: Diagnosis not present

## 2015-03-21 MED ORDER — GADOBENATE DIMEGLUMINE 529 MG/ML IV SOLN
15.0000 mL | Freq: Once | INTRAVENOUS | Status: AC | PRN
  Administered 2015-03-21: 13 mL via INTRAVENOUS

## 2015-03-31 ENCOUNTER — Ambulatory Visit (HOSPITAL_BASED_OUTPATIENT_CLINIC_OR_DEPARTMENT_OTHER): Payer: Medicare Other | Admitting: Hematology

## 2015-03-31 ENCOUNTER — Encounter: Payer: Self-pay | Admitting: Hematology

## 2015-03-31 VITALS — BP 141/66 | HR 103 | Temp 98.7°F | Resp 18 | Ht 61.0 in | Wt 142.6 lb

## 2015-03-31 DIAGNOSIS — R222 Localized swelling, mass and lump, trunk: Secondary | ICD-10-CM

## 2015-04-02 NOTE — Progress Notes (Signed)
[name]  HEMATOLOGY ONCOLOGY PROGRESS NOTE  Date of service: .03/31/2015  Patient Care Team: Vernie Shanks, MD as PCP - General (Family Medicine)  Diagnosis: Posterior chest wall calcified mass - undetermined etiology  Current Treatment: Evaluation and monitoring  INTERVAL HISTORY:  Felicia Gonzalez is here for her scheduled followup to discuss the results of her MRI of the chest which was done for evaluation of her posterior chest wall mass. Her MRI suggested benign-looking calcified lesion that has not changed in size since her previous CT scan in about 3 months ago.  We discussed in detail the diagnostic possibilities and possible approaches to evaluating/managing this. She notes that her rheumatoid arthritis has been getting worse with increasing elbow and shoulder pains as well as hand pains.  She feels that her Plaquenil has not been helpful in and that her rheumatologist is contemplating the use of leflunomide and that she is being worked up with a TB test prior to starting this. No other acute new symptoms. She reports that she has been started on Fosamax to address her osteoporosis since her last clinic visit.  REVIEW OF SYSTEMS:    10 Point review of systems of done and is negative except as noted above.  . Past Medical History  Diagnosis Date  . Arthritis     rheumatoid  . Asthma   . Osteoporosis   . Glaucoma   . Anemia     . Past Surgical History  Procedure Laterality Date  . No past surgeries    . Total hip arthroplasty Left 10/01/2014    Procedure: TOTAL HIP ARTHROPLASTY;  Surgeon: Garald Balding, MD;  Location: [facility];  Service: Orthopedics;  Laterality: Left;    . Social History  Substance Use Topics  . Smoking status: Never Smoker   . Smokeless tobacco: None  . Alcohol Use: No    ALLERGIES:  is allergic to sulfa antibiotics and penicillins.  MEDICATIONS:  Current Outpatient Prescriptions  Medication Sig Dispense Refill  . alendronate (FOSAMAX) 10 MG tablet  Take 10 mg by mouth daily before breakfast. Take with a full glass of water on an empty stomach.    . hydroxychloroquine (PLAQUENIL) 200 MG tablet Take 200 mg by mouth daily.   0  . Multiple Vitamin (MULTIVITAMIN) capsule Take 1 capsule by mouth daily.    [name] omega-3 acid ethyl esters (LOVAZA) 1 G capsule Take by mouth 2 (two) times daily.    . predniSONE (DELTASONE) 1 MG tablet Take 4 mg by mouth daily.  2  . predniSONE (DELTASONE) 5 MG tablet Take 10 mg by mouth every morning.   0  . saccharomyces boulardii (FLORASTOR) 250 MG capsule Take 250 mg by mouth 2 (two) times daily.    . travoprost, benzalkonium, (TRAVATAN) 0.004 % ophthalmic solution 1 drop at bedtime.     No current facility-administered medications for this visit.    PHYSICAL EXAMINATION: ECOG PERFORMANCE STATUS: 1 - Symptomatic but completely ambulatory  . Filed Vitals:   03/31/15 1556  BP: 141/66  Pulse: 103  Temp: 98.7 F (37.1 C)  Resp: 18    Filed Weights   03/31/15 1556  Weight: 142 lb 9.6 oz (64.683 kg)   .Body mass index is 26.96 kg/(m^2).  GENERAL:alert, in no acute distress and comfortable SKIN: skin color, texture, turgor are normal, no rashes or significant lesions EYES: normal, conjunctiva are pink and non-injected, sclera clear OROPHARYNX:no exudate, no erythema and lips, buccal mucosa, and tongue normal  NECK: supple, no  JVD, thyroid normal size, non-tender, without nodularity LYMPH:  no palpable lymphadenopathy in the cervical, axillary or inguinal LUNGS: clear to auscultation with normal respiratory effort HEART: regular rate & rhythm,  no murmurs and no lower extremity edema ABDOMEN: abdomen soft, non-tender, normoactive bowel sounds  Musculoskeletal: appears to have active synovitis in her hands bilateral elbows and possibly left shoulder related to her rheumatoid arthritis PSYCH: alert & oriented x 3 with fluent speech NEURO: no focal motor/sensory deficits  LABORATORY DATA:   I have  reviewed the data as listed  . CBC Latest Ref Rng 02/17/2015 12/20/2014 10/04/2014  WBC 3.9 - 10.3 10e3/uL 9.8 11.2(H) 10.3  Hemoglobin 11.6 - 15.9 g/dL 10.5(L) 11.3(L) 8.6(L)  Hematocrit 34.8 - 46.6 % 32.4(L) 34.8(L) 26.5(L)  Platelets 145 - 400 10e3/uL 494(H) 417(H) 262    . CMP Latest Ref Rng 02/17/2015 10/04/2014 10/03/2014  Glucose 70 - 140 mg/dl 170(H) 93 91  BUN 7.0 - 26.0 mg/dL 14.2 6 8   Creatinine 0.6 - 1.1 mg/dL 0.7 0.61 0.63  Sodium 136 - 145 mEq/L 139 136 139  Potassium 3.5 - 5.1 mEq/L 4.5 3.5 4.2  Chloride 101 - 111 mmol/L - 100(L) 104  CO2 22 - 29 mEq/L 28 30 29   Calcium 8.4 - 10.4 mg/dL 9.5 8.3(L) 8.3(L)  Total Protein 6.4 - 8.3 g/dL 7.5 - -  Total Bilirubin 0.20 - 1.20 mg/dL <0.20 - -  Alkaline Phos 40 - 150 U/L 134 - -  AST 5 - 34 U/L 37(H) - -  ALT 0 - 55 U/L 51 - -     RADIOGRAPHIC STUDIES: I have personally reviewed the radiological images as listed and agreed with the findings in the report. Mr Chest W Wo Contrast  03/21/2015  CLINICAL DATA:  Soft tissue mass along the medial and anterior margin of the right scapula. No known injury. History of rheumatoid arthritis. Subsequent encounter. EXAM: MRI CHEST WITHOUT AND WITH CONTRAST TECHNIQUE: Multiplanar and multiecho pulse sequences of the thoracic spine were obtained without and with intravenous contrast. CONTRAST:  13 mL MULTIHANCE GADOBENATE DIMEGLUMINE 529 MG/ML IV SOLN COMPARISON:  CT of the right shoulder 12/04/2014. FINDINGS: Again seen is a lesion along the medial margin of the right scapula which measures 3.9 cm craniocaudal by 1 cm AP by 3.0 cm transverse, unchanged. The lesion is uniformly hypo intense on T1 and T2 weighted imaging. It does not enhance after contrast administration. The lesion is well-circumscribed. No edema or enhancement is seen in soft tissues about the lesion. No worrisome bony lesion is identified with glenohumeral degenerative change noted. IMPRESSION: Lesion medial and anterior to the right  scapula is unchanged in size and has benign features. Although it cannot be definitively characterized, it may represent old calcified hematoma related to prior trauma, long standing rheumatoid nodule or remote, benign fibrous tumor. Repeat cross-sectional imaging in 6-9 months could be used to ensure stability. Glenohumeral osteoarthritis. Electronically Signed   By: Inge Rise M.D.   On: 03/21/2015 12:11    ASSESSMENT & PLAN:  74 year old female with  #1 Right posterior chest wall/paraspinal calcified soft tissue mass of unclear etiology. The needle biopsy of her mass on 12/20/2014 was difficult due to the calcification and was nondiagnostic showing only hyalinzed fibrotic tissue with calcification.  MRI chest 03/21/2015 demonstrates no change in the size of this nodule over the last 3 months.  It appears to have benign features and though it cannot be definitively characterize based on his radiographic finding it appears  to be either a calcified hematoma, calcified long-standing rheumatoid nodule or a benign fibrous tumor that is calcified. There is no associated soft tissue edema or associated bony abnormalities. Also the lack of change in size and the benign radiographic features are certainly reassuring. After discussion with the patient she is not keen to have additional tissue diagnosis for more definitive diagnosis at this time which is quite reasonable.  Plan  -after detailed discussion of radiographic findings and diagnostic possibilities and based on the patient's preference we will hold off on trying to get an incisional/excisional biopsy at this time. -Would recommend getting repeat  CT of the chest in about 9-12 months to rule out any interval changes. -If the late followup imaging in 9-12 months is unchanged would consider this a benign finding with no other additional intervention indicated. -Will let the patient's primary care physician followup on this repeat  imaging. -Kindly reconsult [place] if any other questions or concerns arise.  #2 Rheumatoid arthritis currently on plaquenil and prednisone -Continue follow-up with Dr. Estanislado Pandy from rheumatology for ongoing treatments . -Patient notes she has been scheduled for a PPD with consideration of the use of leflunomide for her uncontrolled rheumatoid arthritis.  #3 likely osteoporosis as per patient we do not have access to her DEXA scan results at this time . Her prednisone use is certainly a risk factor for this as is her age. She has had a left subcapital femur fracture already. -Patient notes that she has been started on bisphosphonate and has been taking calcium and vitamin D. -Continue management as per primary care physician.   Repeat CT chest in 9-12 months with primary care physician. Return to care with Dr. Irene Limbo as needed if any other questions or concerns arise.   I spent 15 minutes counseling the patient face to face. The total time spent in the appointment was 20 minutes and more than 50% was on counseling and direct patient cares.    Sullivan Lone MD [place] AAHIVMS [facility] Hematology/Oncology Physician [facility]  (Office):       [phone] (Work cell):  [phone] (Fax):           [phone]

## 2015-04-04 ENCOUNTER — Ambulatory Visit
Admission: RE | Admit: 2015-04-04 | Discharge: 2015-04-04 | Disposition: A | Discharge status: Home / Self Care | Payer: Medicare Other | Source: Ambulatory Visit

## 2015-04-04 DIAGNOSIS — Z1231 Encounter for screening mammogram for malignant neoplasm of breast: Secondary | ICD-10-CM

## 2015-04-09 ENCOUNTER — Other Ambulatory Visit: Payer: Self-pay | Admitting: Family Medicine

## 2015-04-09 DIAGNOSIS — R928 Other abnormal and inconclusive findings on diagnostic imaging of breast: Secondary | ICD-10-CM

## 2015-04-16 ENCOUNTER — Other Ambulatory Visit: Payer: Medicare Other

## 2015-05-05 ENCOUNTER — Ambulatory Visit
Admission: RE | Admit: 2015-05-05 | Discharge: 2015-05-05 | Disposition: A | Discharge status: Home / Self Care | Payer: Medicare Other | Source: Ambulatory Visit | Attending: Family Medicine | Admitting: Family Medicine

## 2015-05-05 DIAGNOSIS — R928 Other abnormal and inconclusive findings on diagnostic imaging of breast: Secondary | ICD-10-CM

## 2015-07-16 ENCOUNTER — Other Ambulatory Visit (HOSPITAL_COMMUNITY): Payer: Self-pay | Admitting: Rheumatology

## 2015-07-16 ENCOUNTER — Ambulatory Visit (HOSPITAL_COMMUNITY)
Admission: RE | Admit: 2015-07-16 | Discharge: 2015-07-16 | Disposition: A | Discharge status: Home / Self Care | Payer: Medicare Other | Source: Ambulatory Visit | Attending: Rheumatology | Admitting: Rheumatology

## 2015-07-16 DIAGNOSIS — Z0000 Encounter for general adult medical examination without abnormal findings: Secondary | ICD-10-CM | POA: Diagnosis not present

## 2015-07-16 DIAGNOSIS — M1990 Unspecified osteoarthritis, unspecified site: Secondary | ICD-10-CM | POA: Diagnosis not present

## 2015-09-29 ENCOUNTER — Other Ambulatory Visit: Payer: Self-pay | Admitting: Family Medicine

## 2015-09-29 DIAGNOSIS — N6310 Unspecified lump in the right breast, unspecified quadrant: Secondary | ICD-10-CM

## 2015-09-29 DIAGNOSIS — R921 Mammographic calcification found on diagnostic imaging of breast: Secondary | ICD-10-CM

## 2015-11-03 ENCOUNTER — Other Ambulatory Visit: Payer: Medicare Other

## 2015-11-10 ENCOUNTER — Other Ambulatory Visit: Payer: Medicare Other

## 2015-11-24 ENCOUNTER — Other Ambulatory Visit: Payer: Medicare Other

## 2015-11-26 ENCOUNTER — Ambulatory Visit
Admission: RE | Admit: 2015-11-26 | Discharge: 2015-11-26 | Disposition: A | Discharge status: Home / Self Care | Payer: Medicare Other | Source: Ambulatory Visit | Attending: Family Medicine | Admitting: Family Medicine

## 2015-11-26 DIAGNOSIS — N6310 Unspecified lump in the right breast, unspecified quadrant: Secondary | ICD-10-CM

## 2015-11-26 DIAGNOSIS — R921 Mammographic calcification found on diagnostic imaging of breast: Secondary | ICD-10-CM

## 2015-12-12 ENCOUNTER — Encounter (HOSPITAL_COMMUNITY): Payer: Medicare Other

## 2015-12-12 ENCOUNTER — Other Ambulatory Visit (HOSPITAL_COMMUNITY): Payer: Self-pay | Admitting: *Deleted

## 2015-12-15 ENCOUNTER — Ambulatory Visit (HOSPITAL_COMMUNITY)
Admission: RE | Admit: 2015-12-15 | Discharge: 2015-12-15 | Disposition: A | Discharge status: Home / Self Care | Payer: Medicare Other | Source: Ambulatory Visit | Attending: Rheumatology | Admitting: Rheumatology

## 2015-12-15 DIAGNOSIS — M0589 Other rheumatoid arthritis with rheumatoid factor of multiple sites: Secondary | ICD-10-CM | POA: Diagnosis not present

## 2015-12-15 LAB — CBC WITH DIFFERENTIAL/PLATELET
BASOS PCT: 1 %
Basophils Absolute: 0 10*3/uL (ref 0.0–0.1)
EOS ABS: 0.3 10*3/uL (ref 0.0–0.7)
EOS PCT: 3 %
HCT: 32.1 % — ABNORMAL LOW (ref 36.0–46.0)
HEMOGLOBIN: 10.3 g/dL — AB (ref 12.0–15.0)
LYMPHS ABS: 2.7 10*3/uL (ref 0.7–4.0)
Lymphocytes Relative: 30 %
MCH: 27.1 pg (ref 26.0–34.0)
MCHC: 32.1 g/dL (ref 30.0–36.0)
MCV: 84.5 fL (ref 78.0–100.0)
Monocytes Absolute: 0.9 10*3/uL (ref 0.1–1.0)
Monocytes Relative: 10 %
NEUTROS PCT: 56 %
Neutro Abs: 5 10*3/uL (ref 1.7–7.7)
PLATELETS: 294 10*3/uL (ref 150–400)
RBC: 3.8 MIL/uL — AB (ref 3.87–5.11)
RDW: 15.4 % (ref 11.5–15.5)
WBC: 8.9 10*3/uL (ref 4.0–10.5)

## 2015-12-15 LAB — COMPREHENSIVE METABOLIC PANEL
ALBUMIN: 3.2 g/dL — AB (ref 3.5–5.0)
ALK PHOS: 75 U/L (ref 38–126)
ALT: 29 U/L (ref 14–54)
ANION GAP: 5 (ref 5–15)
AST: 34 U/L (ref 15–41)
BUN: 9 mg/dL (ref 6–20)
CHLORIDE: 102 mmol/L (ref 101–111)
CO2: 29 mmol/L (ref 22–32)
Calcium: 9 mg/dL (ref 8.9–10.3)
Creatinine, Ser: 0.78 mg/dL (ref 0.44–1.00)
GFR calc non Af Amer: >60 mL/min (ref >60)
GLUCOSE: 91 mg/dL (ref 65–99)
Potassium: 3.8 mmol/L (ref 3.5–5.1)
SODIUM: 136 mmol/L (ref 135–145)
Total Bilirubin: 0.6 mg/dL (ref 0.3–1.2)
Total Protein: 6.5 g/dL (ref 6.5–8.1)

## 2015-12-15 MED ORDER — DIPHENHYDRAMINE HCL 25 MG PO CAPS
ORAL_CAPSULE | ORAL | Status: AC
  Administered 2015-12-15: 25 mg via ORAL
  Filled 2015-12-15: qty 1

## 2015-12-15 MED ORDER — ACETAMINOPHEN 325 MG PO TABS: 650.0000 mg | ORAL_TABLET | ORAL | Status: DC

## 2015-12-15 MED ORDER — DIPHENHYDRAMINE HCL 25 MG PO TABS
25.0000 mg | ORAL_TABLET | ORAL | Status: DC
  Administered 2015-12-15: 25 mg via ORAL
  Filled 2015-12-15: qty 1

## 2015-12-15 MED ORDER — SODIUM CHLORIDE 0.9 % IV SOLN
750.0000 mg | INTRAVENOUS | Status: DC
  Administered 2015-12-15: 750 mg via INTRAVENOUS
  Filled 2015-12-15: qty 30

## 2015-12-15 MED ORDER — SODIUM CHLORIDE 0.9 % IV SOLN
INTRAVENOUS | Status: DC
  Administered 2015-12-15: 12:00:00 via INTRAVENOUS

## 2015-12-15 MED ORDER — ACETAMINOPHEN 325 MG PO TABS
ORAL_TABLET | ORAL | Status: AC
  Filled 2015-12-15: qty 2

## 2015-12-29 ENCOUNTER — Ambulatory Visit (HOSPITAL_COMMUNITY)
Admission: RE | Admit: 2015-12-29 | Discharge: 2015-12-29 | Disposition: A | Discharge status: Home / Self Care | Payer: Medicare Other | Source: Ambulatory Visit | Attending: Rheumatology | Admitting: Rheumatology

## 2015-12-29 DIAGNOSIS — M069 Rheumatoid arthritis, unspecified: Secondary | ICD-10-CM | POA: Insufficient documentation

## 2015-12-29 MED ORDER — SODIUM CHLORIDE 0.9 % IV SOLN
750.0000 mg | INTRAVENOUS | Status: AC
  Administered 2015-12-29: 750 mg via INTRAVENOUS
  Filled 2015-12-29: qty 30

## 2015-12-29 MED ORDER — ACETAMINOPHEN 325 MG PO TABS: 650.0000 mg | ORAL_TABLET | ORAL | Status: DC

## 2015-12-29 MED ORDER — DIPHENHYDRAMINE HCL 25 MG PO CAPS
25.0000 mg | ORAL_CAPSULE | ORAL | Status: AC
  Administered 2015-12-29: 25 mg via ORAL
  Filled 2015-12-29: qty 1

## 2015-12-29 MED ORDER — SODIUM CHLORIDE 0.9 % IV SOLN
INTRAVENOUS | Status: AC
Start: 2015-12-29 — End: 2015-12-29
  Administered 2015-12-29: 13:00:00 via INTRAVENOUS

## 2015-12-29 MED ORDER — DIPHENHYDRAMINE HCL 25 MG PO CAPS
ORAL_CAPSULE | ORAL | Status: AC
  Administered 2015-12-29: 25 mg via ORAL
  Filled 2015-12-29: qty 1

## 2016-01-12 ENCOUNTER — Ambulatory Visit (HOSPITAL_COMMUNITY): Admission: RE | Admit: 2016-01-12 | Payer: Medicare Other | Source: Ambulatory Visit

## 2016-01-13 ENCOUNTER — Other Ambulatory Visit (HOSPITAL_COMMUNITY): Payer: Self-pay | Admitting: *Deleted

## 2016-01-14 ENCOUNTER — Ambulatory Visit (HOSPITAL_COMMUNITY)
Admission: RE | Admit: 2016-01-14 | Discharge: 2016-01-14 | Disposition: A | Discharge status: Home / Self Care | Payer: Medicare Other | Source: Ambulatory Visit | Attending: Rheumatology | Admitting: Rheumatology

## 2016-01-14 DIAGNOSIS — M0579 Rheumatoid arthritis with rheumatoid factor of multiple sites without organ or systems involvement: Secondary | ICD-10-CM | POA: Diagnosis not present

## 2016-01-14 MED ORDER — ABATACEPT 250 MG IV SOLR
750.0000 mg | INTRAVENOUS | Status: AC
  Administered 2016-01-14: 750 mg via INTRAVENOUS
  Filled 2016-01-14: qty 30

## 2016-01-14 MED ORDER — SODIUM CHLORIDE 0.9 % IV SOLN
INTRAVENOUS | Status: AC
  Administered 2016-01-14: 10:00:00 via INTRAVENOUS

## 2016-01-14 MED ORDER — DIPHENHYDRAMINE HCL 25 MG PO CAPS
ORAL_CAPSULE | ORAL | Status: AC
  Filled 2016-01-14: qty 1

## 2016-01-14 MED ORDER — DIPHENHYDRAMINE HCL 25 MG PO CAPS
25.0000 mg | ORAL_CAPSULE | ORAL | Status: AC
  Administered 2016-01-14: 25 mg via ORAL

## 2016-01-14 MED ORDER — ACETAMINOPHEN 325 MG PO TABS: 650.0000 mg | ORAL_TABLET | Freq: Once | ORAL | Status: DC

## 2016-01-17 DIAGNOSIS — M0579 Rheumatoid arthritis with rheumatoid factor of multiple sites without organ or systems involvement: Secondary | ICD-10-CM

## 2016-01-26 ENCOUNTER — Encounter (HOSPITAL_COMMUNITY): Payer: Medicare Other

## 2016-02-10 ENCOUNTER — Other Ambulatory Visit (HOSPITAL_COMMUNITY): Payer: Self-pay | Admitting: *Deleted

## 2016-02-11 ENCOUNTER — Ambulatory Visit (HOSPITAL_COMMUNITY)
Admission: RE | Admit: 2016-02-11 | Discharge: 2016-02-11 | Disposition: A | Discharge status: Home / Self Care | Payer: Medicare Other | Source: Ambulatory Visit | Attending: Rheumatology | Admitting: Rheumatology

## 2016-02-11 DIAGNOSIS — M0579 Rheumatoid arthritis with rheumatoid factor of multiple sites without organ or systems involvement: Secondary | ICD-10-CM | POA: Insufficient documentation

## 2016-02-11 LAB — CBC
HEMATOCRIT: 32.4 % — AB (ref 36.0–46.0)
Hemoglobin: 10.3 g/dL — ABNORMAL LOW (ref 12.0–15.0)
MCH: 27.2 pg (ref 26.0–34.0)
MCHC: 31.8 g/dL (ref 30.0–36.0)
MCV: 85.7 fL (ref 78.0–100.0)
Platelets: 289 10*3/uL (ref 150–400)
RBC: 3.78 MIL/uL — AB (ref 3.87–5.11)
RDW: 15.8 % — AB (ref 11.5–15.5)
WBC: 6.6 10*3/uL (ref 4.0–10.5)

## 2016-02-11 LAB — COMPREHENSIVE METABOLIC PANEL
ALT: 31 U/L (ref 14–54)
AST: 44 U/L — AB (ref 15–41)
Albumin: 3.1 g/dL — ABNORMAL LOW (ref 3.5–5.0)
Alkaline Phosphatase: 87 U/L (ref 38–126)
Anion gap: 6 (ref 5–15)
BILIRUBIN TOTAL: 1.4 mg/dL — AB (ref 0.3–1.2)
BUN: 12 mg/dL (ref 6–20)
CALCIUM: 9 mg/dL (ref 8.9–10.3)
CO2: 29 mmol/L (ref 22–32)
CREATININE: 0.72 mg/dL (ref 0.44–1.00)
Chloride: 104 mmol/L (ref 101–111)
Glucose, Bld: 84 mg/dL (ref 65–99)
Potassium: 4.8 mmol/L (ref 3.5–5.1)
Sodium: 139 mmol/L (ref 135–145)
TOTAL PROTEIN: 6 g/dL — AB (ref 6.5–8.1)

## 2016-02-11 LAB — DIFFERENTIAL
BASOS ABS: 0.1 10*3/uL (ref 0.0–0.1)
BASOS PCT: 1 %
EOS ABS: 0.2 10*3/uL (ref 0.0–0.7)
EOS PCT: 3 %
Lymphocytes Relative: 39 %
Lymphs Abs: 2.6 10*3/uL (ref 0.7–4.0)
Monocytes Absolute: 0.8 10*3/uL (ref 0.1–1.0)
Monocytes Relative: 12 %
NEUTROS PCT: 45 %
Neutro Abs: 3 10*3/uL (ref 1.7–7.7)

## 2016-02-11 MED ORDER — SODIUM CHLORIDE 0.9 % IV SOLN
750.0000 mg | INTRAVENOUS | Status: AC
  Administered 2016-02-11: 750 mg via INTRAVENOUS
  Filled 2016-02-11: qty 30

## 2016-02-11 MED ORDER — DIPHENHYDRAMINE HCL 25 MG PO CAPS
ORAL_CAPSULE | ORAL | Status: AC
  Filled 2016-02-11: qty 1

## 2016-02-11 MED ORDER — DIPHENHYDRAMINE HCL 25 MG PO CAPS
25.0000 mg | ORAL_CAPSULE | ORAL | Status: AC
  Administered 2016-02-11: 25 mg via ORAL

## 2016-02-11 MED ORDER — SODIUM CHLORIDE 0.9 % IV SOLN
INTRAVENOUS | Status: AC
  Administered 2016-02-11: 10:00:00 via INTRAVENOUS

## 2016-02-11 MED ORDER — ACETAMINOPHEN 325 MG PO TABS: 650.0000 mg | ORAL_TABLET | Freq: Once | ORAL | Status: DC

## 2016-02-11 NOTE — Progress Notes (Signed)
Patient denies s/s reaction at discharge

## 2016-03-09 ENCOUNTER — Other Ambulatory Visit (HOSPITAL_COMMUNITY): Payer: Self-pay | Admitting: *Deleted

## 2016-03-10 ENCOUNTER — Ambulatory Visit (HOSPITAL_COMMUNITY)
Admission: RE | Admit: 2016-03-10 | Discharge: 2016-03-10 | Disposition: A | Discharge status: Home / Self Care | Payer: Medicare Other | Source: Ambulatory Visit | Attending: Rheumatology | Admitting: Rheumatology

## 2016-03-10 DIAGNOSIS — M0579 Rheumatoid arthritis with rheumatoid factor of multiple sites without organ or systems involvement: Secondary | ICD-10-CM | POA: Insufficient documentation

## 2016-03-10 MED ORDER — DIPHENHYDRAMINE HCL 25 MG PO CAPS
25.0000 mg | ORAL_CAPSULE | ORAL | Status: AC
  Administered 2016-03-10: 25 mg via ORAL

## 2016-03-10 MED ORDER — SODIUM CHLORIDE 0.9 % IV SOLN
INTRAVENOUS | Status: AC
  Administered 2016-03-10: 11:00:00 via INTRAVENOUS

## 2016-03-10 MED ORDER — DIPHENHYDRAMINE HCL 25 MG PO CAPS
ORAL_CAPSULE | ORAL | Status: AC
  Filled 2016-03-10: qty 1

## 2016-03-10 MED ORDER — SODIUM CHLORIDE 0.9 % IV SOLN
750.0000 mg | INTRAVENOUS | Status: AC
  Administered 2016-03-10: 750 mg via INTRAVENOUS
  Filled 2016-03-10: qty 30

## 2016-03-10 MED ORDER — ACETAMINOPHEN 325 MG PO TABS: 650.0000 mg | ORAL_TABLET | Freq: Once | ORAL | Status: DC

## 2016-03-12 LAB — QUANTIFERON IN TUBE
QFT TB AG MINUS NIL VALUE: 0.23 IU/mL
QUANTIFERON MITOGEN VALUE: 8.01 IU/mL
QUANTIFERON NIL VALUE: 0.3 [IU]/mL
QUANTIFERON TB AG VALUE: 0.53 [IU]/mL
QUANTIFERON TB GOLD: NEGATIVE

## 2016-03-12 LAB — QUANTIFERON TB GOLD ASSAY (BLOOD)

## 2016-04-07 ENCOUNTER — Ambulatory Visit (HOSPITAL_COMMUNITY): Payer: Medicare Other

## 2016-04-09 ENCOUNTER — Other Ambulatory Visit (HOSPITAL_COMMUNITY): Payer: Self-pay | Admitting: *Deleted

## 2016-04-12 ENCOUNTER — Telehealth: Payer: Self-pay | Admitting: Radiology

## 2016-04-12 ENCOUNTER — Ambulatory Visit (HOSPITAL_COMMUNITY)
Admission: RE | Admit: 2016-04-12 | Discharge: 2016-04-12 | Disposition: A | Discharge status: Home / Self Care | Payer: Medicare Other | Source: Ambulatory Visit | Attending: Rheumatology | Admitting: Rheumatology

## 2016-04-12 ENCOUNTER — Other Ambulatory Visit: Payer: Self-pay | Admitting: Radiology

## 2016-04-12 DIAGNOSIS — M0579 Rheumatoid arthritis with rheumatoid factor of multiple sites without organ or systems involvement: Secondary | ICD-10-CM | POA: Diagnosis not present

## 2016-04-12 LAB — CBC WITH DIFFERENTIAL/PLATELET
BASOS PCT: 1 %
Basophils Absolute: 0.1 10*3/uL (ref 0.0–0.1)
Eosinophils Absolute: 0.2 10*3/uL (ref 0.0–0.7)
Eosinophils Relative: 3 %
HCT: 32.2 % — ABNORMAL LOW (ref 36.0–46.0)
HEMOGLOBIN: 10.5 g/dL — AB (ref 12.0–15.0)
LYMPHS ABS: 3 10*3/uL (ref 0.7–4.0)
Lymphocytes Relative: 39 %
MCH: 27.9 pg (ref 26.0–34.0)
MCHC: 32.6 g/dL (ref 30.0–36.0)
MCV: 85.4 fL (ref 78.0–100.0)
Monocytes Absolute: 0.5 10*3/uL (ref 0.1–1.0)
Monocytes Relative: 6 %
NEUTROS ABS: 3.8 10*3/uL (ref 1.7–7.7)
NEUTROS PCT: 51 %
Platelets: 294 10*3/uL (ref 150–400)
RBC: 3.77 MIL/uL — AB (ref 3.87–5.11)
RDW: 15.6 % — ABNORMAL HIGH (ref 11.5–15.5)
WBC: 7.6 10*3/uL (ref 4.0–10.5)

## 2016-04-12 LAB — COMPREHENSIVE METABOLIC PANEL
ALBUMIN: 3.4 g/dL — AB (ref 3.5–5.0)
ALK PHOS: 98 U/L (ref 38–126)
ALT: 36 U/L (ref 14–54)
AST: 34 U/L (ref 15–41)
Anion gap: 7 (ref 5–15)
BUN: 9 mg/dL (ref 6–20)
CALCIUM: 9.3 mg/dL (ref 8.9–10.3)
CO2: 28 mmol/L (ref 22–32)
CREATININE: 0.75 mg/dL (ref 0.44–1.00)
Chloride: 103 mmol/L (ref 101–111)
GFR calc Af Amer: >60 mL/min (ref >60)
GFR calc non Af Amer: >60 mL/min (ref >60)
GLUCOSE: 92 mg/dL (ref 65–99)
Potassium: 3.7 mmol/L (ref 3.5–5.1)
SODIUM: 138 mmol/L (ref 135–145)
Total Bilirubin: 0.1 mg/dL — ABNORMAL LOW (ref 0.3–1.2)
Total Protein: 6.4 g/dL — ABNORMAL LOW (ref 6.5–8.1)

## 2016-04-12 MED ORDER — DIPHENHYDRAMINE HCL 25 MG PO CAPS
ORAL_CAPSULE | ORAL | Status: AC
  Filled 2016-04-12: qty 1

## 2016-04-12 MED ORDER — DIPHENHYDRAMINE HCL 25 MG PO CAPS
25.0000 mg | ORAL_CAPSULE | ORAL | Status: DC
  Administered 2016-04-12: 25 mg via ORAL

## 2016-04-12 MED ORDER — ALENDRONATE SODIUM 10 MG PO TABS: 10.0000 mg | ORAL_TABLET | Freq: Every day | ORAL | 1 refills | Status: DC

## 2016-04-12 MED ORDER — SODIUM CHLORIDE 0.9 % IV SOLN
INTRAVENOUS | Status: DC
  Administered 2016-04-12: 11:00:00 via INTRAVENOUS

## 2016-04-12 MED ORDER — SODIUM CHLORIDE 0.9 % IV SOLN
750.0000 mg | INTRAVENOUS | Status: DC
  Administered 2016-04-12: 750 mg via INTRAVENOUS
  Filled 2016-04-12: qty 30

## 2016-04-12 MED ORDER — ACETAMINOPHEN 325 MG PO TABS: 650.0000 mg | ORAL_TABLET | ORAL | Status: DC

## 2016-04-12 NOTE — Telephone Encounter (Signed)
I have called patient to advise labs are normal  

## 2016-04-12 NOTE — Telephone Encounter (Signed)
Called patient with lab results, WNL  She has requested refill of Fosamax Ok to refill per Dr Estanislado Pandy

## 2016-04-12 NOTE — Progress Notes (Signed)
Labs stable

## 2016-04-12 NOTE — Telephone Encounter (Signed)
-----   Message from Bo Merino, MD sent at 04/12/2016 12:28 PM EST ----- Labs stable

## 2016-04-14 ENCOUNTER — Telehealth: Payer: Self-pay | Admitting: Rheumatology

## 2016-04-14 ENCOUNTER — Other Ambulatory Visit: Payer: Self-pay | Admitting: Radiology

## 2016-04-14 MED ORDER — ALENDRONATE SODIUM 70 MG PO TABS: 70.0000 mg | ORAL_TABLET | ORAL | 0 refills | Status: DC

## 2016-04-14 NOTE — Telephone Encounter (Signed)
Patient picked up her Alendronate sodium 10mg  one a dail. Patient was taking 70mg  once a week. Patient wants to know why it changed. Please call her back [phone]

## 2016-04-14 NOTE — Telephone Encounter (Signed)
10 mg dose was error, have resent for 70mg  dose. Called patient to advise. Apologized for Error. Asked her to call pharmacy.

## 2016-04-27 ENCOUNTER — Other Ambulatory Visit: Payer: Self-pay | Admitting: Family Medicine

## 2016-04-27 DIAGNOSIS — R921 Mammographic calcification found on diagnostic imaging of breast: Secondary | ICD-10-CM

## 2016-04-27 DIAGNOSIS — I96 Gangrene, not elsewhere classified: Secondary | ICD-10-CM

## 2016-04-30 ENCOUNTER — Ambulatory Visit: Payer: Medicare Other | Admitting: Dietician

## 2016-05-05 ENCOUNTER — Ambulatory Visit: Payer: Medicare Other | Admitting: Dietician

## 2016-05-10 ENCOUNTER — Other Ambulatory Visit: Payer: Self-pay | Admitting: Radiology

## 2016-05-10 ENCOUNTER — Telehealth: Payer: Self-pay | Admitting: Rheumatology

## 2016-05-10 ENCOUNTER — Ambulatory Visit (HOSPITAL_COMMUNITY)
Admission: RE | Admit: 2016-05-10 | Discharge: 2016-05-10 | Disposition: A | Discharge status: Home / Self Care | Payer: Medicare Other | Source: Ambulatory Visit | Attending: Rheumatology | Admitting: Rheumatology

## 2016-05-10 DIAGNOSIS — M0579 Rheumatoid arthritis with rheumatoid factor of multiple sites without organ or systems involvement: Secondary | ICD-10-CM | POA: Diagnosis not present

## 2016-05-10 MED ORDER — ACETAMINOPHEN 325 MG PO TABS: 650.0000 mg | ORAL_TABLET | Freq: Once | ORAL | Status: DC

## 2016-05-10 MED ORDER — DIPHENHYDRAMINE HCL 25 MG PO CAPS
ORAL_CAPSULE | ORAL | Status: AC
  Administered 2016-05-10: 25 mg via ORAL
  Filled 2016-05-10: qty 1

## 2016-05-10 MED ORDER — DIPHENHYDRAMINE HCL 25 MG PO CAPS
25.0000 mg | ORAL_CAPSULE | Freq: Once | ORAL | Status: AC
  Administered 2016-05-10: 25 mg via ORAL

## 2016-05-10 MED ORDER — ABATACEPT 250 MG IV SOLR
750.0000 mg | INTRAVENOUS | Status: DC
  Administered 2016-05-10: 750 mg via INTRAVENOUS
  Filled 2016-05-10: qty 30

## 2016-05-10 NOTE — Telephone Encounter (Signed)
Patient has questions about prednisone. Please call patient.

## 2016-05-11 NOTE — Telephone Encounter (Signed)
Attempted to contact patient and left message for patient to call the office.  

## 2016-05-12 ENCOUNTER — Other Ambulatory Visit: Payer: Medicare Other

## 2016-05-12 ENCOUNTER — Other Ambulatory Visit: Payer: Self-pay | Admitting: Family Medicine

## 2016-05-12 ENCOUNTER — Ambulatory Visit
Admission: RE | Admit: 2016-05-12 | Discharge: 2016-05-12 | Disposition: A | Discharge status: Home / Self Care | Payer: Medicare Other | Source: Ambulatory Visit | Attending: Family Medicine | Admitting: Family Medicine

## 2016-05-12 DIAGNOSIS — I96 Gangrene, not elsewhere classified: Secondary | ICD-10-CM

## 2016-05-12 DIAGNOSIS — R229 Localized swelling, mass and lump, unspecified: Principal | ICD-10-CM

## 2016-05-12 DIAGNOSIS — R921 Mammographic calcification found on diagnostic imaging of breast: Secondary | ICD-10-CM

## 2016-05-12 DIAGNOSIS — IMO0002 Reserved for concepts with insufficient information to code with codable children: Secondary | ICD-10-CM

## 2016-05-13 ENCOUNTER — Other Ambulatory Visit: Payer: Self-pay | Admitting: Family Medicine

## 2016-05-13 DIAGNOSIS — R229 Localized swelling, mass and lump, unspecified: Principal | ICD-10-CM

## 2016-05-13 DIAGNOSIS — IMO0002 Reserved for concepts with insufficient information to code with codable children: Secondary | ICD-10-CM

## 2016-05-13 NOTE — Telephone Encounter (Signed)
Patient state she is taper on her Prednisone and she is now down 4 mg and is in need of a prescription for Prednisone 1 mg tablets.   Last visit: 01/27/16 Next visit:06/09/16 Labs: 03/10/16 Stable  Okay to refill Prednisone?

## 2016-05-13 NOTE — Telephone Encounter (Signed)
ok 

## 2016-05-14 ENCOUNTER — Ambulatory Visit
Admission: RE | Admit: 2016-05-14 | Discharge: 2016-05-14 | Disposition: A | Discharge status: Home / Self Care | Payer: Medicare Other | Source: Ambulatory Visit | Attending: Family Medicine | Admitting: Family Medicine

## 2016-05-14 DIAGNOSIS — R229 Localized swelling, mass and lump, unspecified: Principal | ICD-10-CM

## 2016-05-14 DIAGNOSIS — R921 Mammographic calcification found on diagnostic imaging of breast: Secondary | ICD-10-CM

## 2016-05-14 DIAGNOSIS — IMO0002 Reserved for concepts with insufficient information to code with codable children: Secondary | ICD-10-CM

## 2016-05-14 HISTORY — PX: BREAST BIOPSY: SHX20

## 2016-05-14 MED ORDER — PREDNISONE 1 MG PO TABS: 4.0000 mg | ORAL_TABLET | Freq: Every day | ORAL | 2 refills | Status: DC

## 2016-05-14 NOTE — Addendum Note (Signed)
Addended by: Carole Binning on: 05/14/2016 02:44 PM   Modules accepted: Orders

## 2016-05-28 ENCOUNTER — Ambulatory Visit: Payer: Medicare Other | Admitting: Skilled Nursing Facility1

## 2016-05-31 DIAGNOSIS — C50919 Malignant neoplasm of unspecified site of unspecified female breast: Secondary | ICD-10-CM

## 2016-05-31 HISTORY — DX: Malignant neoplasm of unspecified site of unspecified female breast: C50.919

## 2016-06-01 ENCOUNTER — Telehealth: Payer: Self-pay | Admitting: Radiology

## 2016-06-01 NOTE — Telephone Encounter (Signed)
Called patient she left message for Debbie for me to call her back. She states she had a cold and cough, she states she increased her prednisone to 8mg , because she feels like she has asthma. She sounds very short of breath on the phone. I have advised her she needs to see her primary care immediately and not to infuse until she has cleared the cough and she feels better. She voiced understanding . To you FYI

## 2016-06-04 ENCOUNTER — Other Ambulatory Visit (HOSPITAL_COMMUNITY): Payer: Self-pay | Admitting: *Deleted

## 2016-06-07 ENCOUNTER — Ambulatory Visit (HOSPITAL_COMMUNITY): Admission: RE | Admit: 2016-06-07 | Payer: Medicare Other | Source: Ambulatory Visit

## 2016-06-08 DIAGNOSIS — Z79899 Other long term (current) drug therapy: Secondary | ICD-10-CM | POA: Insufficient documentation

## 2016-06-08 DIAGNOSIS — M810 Age-related osteoporosis without current pathological fracture: Secondary | ICD-10-CM | POA: Insufficient documentation

## 2016-06-08 DIAGNOSIS — J4540 Moderate persistent asthma, uncomplicated: Secondary | ICD-10-CM | POA: Insufficient documentation

## 2016-06-08 DIAGNOSIS — R768 Other specified abnormal immunological findings in serum: Secondary | ICD-10-CM | POA: Insufficient documentation

## 2016-06-08 NOTE — Progress Notes (Deleted)
Office Visit Note  Patient: Felicia Gonzalez             Date of Birth: 12/14/1940           MRN: [id number]             PCP: Anthoney Harada, MD Referring: Vernie Shanks, MD Visit Date: 06/09/2016 Occupation: '@GUAROCC'$ @    Subjective:  No chief complaint on file.   History of Present Illness: Felicia Gonzalez is a 76 y.o. female ***   Activities of Daily Living:  Patient reports morning stiffness for *** {minute/hour:19697}.   Patient {ACTIONS;DENIES/REPORTS:21021675::"Denies"} nocturnal pain.  Difficulty dressing/grooming: {ACTIONS;DENIES/REPORTS:21021675::"Denies"} Difficulty climbing stairs: {ACTIONS;DENIES/REPORTS:21021675::"Denies"} Difficulty getting out of chair: {ACTIONS;DENIES/REPORTS:21021675::"Denies"} Difficulty using hands for taps, buttons, cutlery, and/or writing: {ACTIONS;DENIES/REPORTS:21021675::"Denies"}   No Rheumatology ROS completed.   PMFS History:  Patient Active Problem List   Diagnosis Date Noted  . High risk medication use 06/08/2016  . Age-related osteoporosis without current pathological fracture 06/08/2016  . ANA positive 06/08/2016  . Moderate persistent asthma 06/08/2016  . Normocytic anemia 10/01/2014  . Rheumatoid arthritis of multiple sites without organ or system involvement with positive rheumatoid factor ([place]) 10/01/2014  . Hip fracture ([place]) 10/01/2014  . Subcapital fracture of left hip ([place]) 10/01/2014  . Closed left hip fracture ([place]) 09/30/2014    Past Medical History:  Diagnosis Date  . Anemia   . Arthritis    rheumatoid +CCP neg RF +ANA synovitis   . Asthma   . Glaucoma   . Osteoporosis     Family History  Problem Relation Age of Onset  . Hypertension Father    Past Surgical History:  Procedure Laterality Date  . NO PAST SURGERIES    . TOTAL HIP ARTHROPLASTY Left 10/01/2014   Procedure: TOTAL HIP ARTHROPLASTY;  Surgeon: Garald Balding, MD;  Location: [facility];  Service: Orthopedics;  Laterality: Left;   Social  History   Social History Narrative  . No narrative on file     Objective: Vital Signs: There were no vitals taken for this visit.   Physical Exam   Musculoskeletal Exam: ***  CDAI Exam: No CDAI exam completed.    Investigation: Findings:  03/10/16 negative TB gold  07/16/15 Chest X-ray no acute disease 05/10/12 X-ray of right foot was obtained, 3 views today, which showed right 1st MTP narrowing, questionable right 5th MTP erosion versus some post traumatic pain and a calcaneal spur without any fracture 02/21/2015 negative Hepatitis screening panel , SPEP /IFE Interpretation - Nonspecific pattern, sometimes seen in association with acute inflammatory response pattern.   Recent Results (from the past 2160 hour(s))  CBC with Differential/Platelet     Status: Abnormal   Collection Time: 04/12/16 10:12 AM  Result Value Ref Range   WBC 7.6 4.0 - 10.5 K/uL   RBC 3.77 (L) 3.87 - 5.11 MIL/uL   Hemoglobin 10.5 (L) 12.0 - 15.0 g/dL   HCT 32.2 (L) 36.0 - 46.0 %   MCV 85.4 78.0 - 100.0 fL   MCH 27.9 26.0 - 34.0 pg   MCHC 32.6 30.0 - 36.0 g/dL   RDW 15.6 (H) 11.5 - 15.5 %   Platelets 294 150 - 400 K/uL   Neutrophils Relative % 51 %   Neutro Abs 3.8 1.7 - 7.7 K/uL   Lymphocytes Relative 39 %   Lymphs Abs 3.0 0.7 - 4.0 K/uL   Monocytes Relative 6 %   Monocytes Absolute 0.5 0.1 - 1.0 K/uL   Eosinophils Relative 3 %  Eosinophils Absolute 0.2 0.0 - 0.7 K/uL   Basophils Relative 1 %   Basophils Absolute 0.1 0.0 - 0.1 K/uL  Comprehensive metabolic panel     Status: Abnormal   Collection Time: 04/12/16 10:12 AM  Result Value Ref Range   Sodium 138 135 - 145 mmol/L   Potassium 3.7 3.5 - 5.1 mmol/L   Chloride 103 101 - 111 mmol/L   CO2 28 22 - 32 mmol/L   Glucose, Bld 92 65 - 99 mg/dL   BUN 9 6 - 20 mg/dL   Creatinine, Ser 0.75 0.44 - 1.00 mg/dL   Calcium 9.3 8.9 - 10.3 mg/dL   Total Protein 6.4 (L) 6.5 - 8.1 g/dL   Albumin 3.4 (L) 3.5 - 5.0 g/dL   AST 34 15 - 41 U/L   ALT 36  14 - 54 U/L   Alkaline Phosphatase 98 38 - 126 U/L   Total Bilirubin 0.1 (L) 0.3 - 1.2 mg/dL   GFR calc non Af Amer >60 >60 mL/min   GFR calc Af Amer >60 >60 mL/min    Comment: (NOTE) The eGFR has been calculated using the CKD EPI equation. This calculation has not been validated in all clinical situations. eGFR's persistently <60 mL/min signify possible Chronic Kidney Disease.    Anion gap 7 5 - 15     Imaging: US Breast Ltd Uni Left Inc Axilla  Result Date: 05/12/2016 CLINICAL DATA:  Followup for probably benign right breast mass and probably benign left breast calcifications. EXAM: 2D DIGITAL DIAGNOSTIC BILATERAL MAMMOGRAM WITH CAD AND ADJUNCT TOMO BILATERAL BREAST ULTRASOUND COMPARISON:  Previous exam(s). ACR Breast Density Category c: The breast tissue is heterogeneously dense, which may obscure small masses. FINDINGS: No suspicious masses or calcifications are seen in the right breast. The oval circumscribed mass in the upper-outer posterior right breast appears unchanged. Spot compression magnification views were performed over the inner left breast demonstrating unchanged 3 mm group of punctate calcifications. There is a mass with irregular margins in the lower inner left breast measuring approximately 7-8 mm. Mammographic images were processed with CAD. Physical examination of the left breast reveals a firm nodule at the 8 o'clock position. Targeted ultrasound of the left breast was performed demonstrating an irregular hypoechoic mass at 8 o'clock 2 cm from nipple measuring 0.8 x 0.6 x 0.7 cm. This corresponds well with the mass seen at mammography. No lymphadenopathy seen in the left axilla. Targeted ultrasound of the right breast was performed demonstrating a mixed echogenicity mass at 10 o'clock 8 cm from nipple measuring 1.4 x 0.6 x 1.1 cm. Although this may represent an area of fat necrosis is indeterminate. No lymphadenopathy seen in the right axilla. IMPRESSION: 1. Suspicious mass  in the left breast at the 8 o'clock position. 2. Stable appearance of punctate calcifications in the lower inner left breast. 3. Indeterminate mass in the right breast at 10 o'clock 8 cm from the nipple which cannot be clearly characterized as an area of fat necrosis. RECOMMENDATION: 1. Recommend ultrasound-guided biopsy of the mass in the left breast at the 8 o'clock position. 2. Recommend stereotactic guided biopsy of the calcifications in the lower inner left breast (given the suspicious mass seen in close proximity to the calcifications). 3. Recommend ultrasound-guided biopsy of the mass in the right breast at 10 o'clock position. Biopsies are being scheduled for the patient. I have discussed the findings and recommendations with the patient. Results were also provided in writing at the conclusion of the visit.  If applicable, a reminder letter will be sent to the patient regarding the next appointment. BI-RADS CATEGORY  4: Suspicious. Electronically Signed   By: Everlean Alstrom M.D.   On: 05/12/2016 13:56   US Breast Ltd Uni Right Inc Axilla  Result Date: 05/12/2016 CLINICAL DATA:  Followup for probably benign right breast mass and probably benign left breast calcifications. EXAM: 2D DIGITAL DIAGNOSTIC BILATERAL MAMMOGRAM WITH CAD AND ADJUNCT TOMO BILATERAL BREAST ULTRASOUND COMPARISON:  Previous exam(s). ACR Breast Density Category c: The breast tissue is heterogeneously dense, which may obscure small masses. FINDINGS: No suspicious masses or calcifications are seen in the right breast. The oval circumscribed mass in the upper-outer posterior right breast appears unchanged. Spot compression magnification views were performed over the inner left breast demonstrating unchanged 3 mm group of punctate calcifications. There is a mass with irregular margins in the lower inner left breast measuring approximately 7-8 mm. Mammographic images were processed with CAD. Physical examination of the left breast reveals  a firm nodule at the 8 o'clock position. Targeted ultrasound of the left breast was performed demonstrating an irregular hypoechoic mass at 8 o'clock 2 cm from nipple measuring 0.8 x 0.6 x 0.7 cm. This corresponds well with the mass seen at mammography. No lymphadenopathy seen in the left axilla. Targeted ultrasound of the right breast was performed demonstrating a mixed echogenicity mass at 10 o'clock 8 cm from nipple measuring 1.4 x 0.6 x 1.1 cm. Although this may represent an area of fat necrosis is indeterminate. No lymphadenopathy seen in the right axilla. IMPRESSION: 1. Suspicious mass in the left breast at the 8 o'clock position. 2. Stable appearance of punctate calcifications in the lower inner left breast. 3. Indeterminate mass in the right breast at 10 o'clock 8 cm from the nipple which cannot be clearly characterized as an area of fat necrosis. RECOMMENDATION: 1. Recommend ultrasound-guided biopsy of the mass in the left breast at the 8 o'clock position. 2. Recommend stereotactic guided biopsy of the calcifications in the lower inner left breast (given the suspicious mass seen in close proximity to the calcifications). 3. Recommend ultrasound-guided biopsy of the mass in the right breast at 10 o'clock position. Biopsies are being scheduled for the patient. I have discussed the findings and recommendations with the patient. Results were also provided in writing at the conclusion of the visit. If applicable, a reminder letter will be sent to the patient regarding the next appointment. BI-RADS CATEGORY  4: Suspicious. Electronically Signed   By: Everlean Alstrom M.D.   On: 05/12/2016 13:56   Mm Diag Breast Tomo Bilateral  Result Date: 05/12/2016 CLINICAL DATA:  Followup for probably benign right breast mass and probably benign left breast calcifications. EXAM: 2D DIGITAL DIAGNOSTIC BILATERAL MAMMOGRAM WITH CAD AND ADJUNCT TOMO BILATERAL BREAST ULTRASOUND COMPARISON:  Previous exam(s). ACR Breast Density  Category c: The breast tissue is heterogeneously dense, which may obscure small masses. FINDINGS: No suspicious masses or calcifications are seen in the right breast. The oval circumscribed mass in the upper-outer posterior right breast appears unchanged. Spot compression magnification views were performed over the inner left breast demonstrating unchanged 3 mm group of punctate calcifications. There is a mass with irregular margins in the lower inner left breast measuring approximately 7-8 mm. Mammographic images were processed with CAD. Physical examination of the left breast reveals a firm nodule at the 8 o'clock position. Targeted ultrasound of the left breast was performed demonstrating an irregular hypoechoic mass at 8 o'clock 2 cm from  nipple measuring 0.8 x 0.6 x 0.7 cm. This corresponds well with the mass seen at mammography. No lymphadenopathy seen in the left axilla. Targeted ultrasound of the right breast was performed demonstrating a mixed echogenicity mass at 10 o'clock 8 cm from nipple measuring 1.4 x 0.6 x 1.1 cm. Although this may represent an area of fat necrosis is indeterminate. No lymphadenopathy seen in the right axilla. IMPRESSION: 1. Suspicious mass in the left breast at the 8 o'clock position. 2. Stable appearance of punctate calcifications in the lower inner left breast. 3. Indeterminate mass in the right breast at 10 o'clock 8 cm from the nipple which cannot be clearly characterized as an area of fat necrosis. RECOMMENDATION: 1. Recommend ultrasound-guided biopsy of the mass in the left breast at the 8 o'clock position. 2. Recommend stereotactic guided biopsy of the calcifications in the lower inner left breast (given the suspicious mass seen in close proximity to the calcifications). 3. Recommend ultrasound-guided biopsy of the mass in the right breast at 10 o'clock position. Biopsies are being scheduled for the patient. I have discussed the findings and recommendations with the patient.  Results were also provided in writing at the conclusion of the visit. If applicable, a reminder letter will be sent to the patient regarding the next appointment. BI-RADS CATEGORY  4: Suspicious. Electronically Signed   By: Everlean Alstrom M.D.   On: 05/12/2016 13:56   Mm Clip Placement Left  Result Date: 05/14/2016 CLINICAL DATA:  Post biopsy mammogram of the bilateral breasts for clip placement. EXAM: DIAGNOSTIC BILATERAL MAMMOGRAM POST ULTRASOUND AND STEREOTACTIC BIOPSY COMPARISON:  Previous exam(s). FINDINGS: Mammographic images were obtained following stereotactic guided biopsy of calcifications in the lower-inner left breast, as well as ultrasound-guided biopsy of a left breast mass at 8 o'clock in and a right breast mass at 10 o'clock. The X shaped biopsy marking clip is appropriately positioned at the site of the biopsied calcifications in the lower inner left breast. A ribbon shaped biopsy marking clip is positioned within the mass biopsied under ultrasound guidance at 8 o'clock in the left breast. A coil shaped biopsy marking clip is positioned within the mass biopsied under ultrasound guidance at 10 o'clock in the right breast. IMPRESSION: 1. Appropriate positioning of the X shaped biopsy marking clip at the site of calcifications in the lower inner left breast. 2. Appropriate positioning of the ribbon shaped biopsy marking clip within the mass at 8 o'clock in the left breast. 3. Appropriate positioning of the coil shaped biopsy marking clip at the site of the biopsied mass at 10 o'clock in the right breast. Final Assessment: Post Procedure Mammograms for Marker Placement Electronically Signed   By: Ammie Ferrier M.D.   On: 05/14/2016 11:03   Mm Clip Placement Right  Result Date: 05/14/2016 CLINICAL DATA:  Post biopsy mammogram of the bilateral breasts for clip placement. EXAM: DIAGNOSTIC BILATERAL MAMMOGRAM POST ULTRASOUND AND STEREOTACTIC BIOPSY COMPARISON:  Previous exam(s). FINDINGS:  Mammographic images were obtained following stereotactic guided biopsy of calcifications in the lower-inner left breast, as well as ultrasound-guided biopsy of a left breast mass at 8 o'clock in and a right breast mass at 10 o'clock. The X shaped biopsy marking clip is appropriately positioned at the site of the biopsied calcifications in the lower inner left breast. A ribbon shaped biopsy marking clip is positioned within the mass biopsied under ultrasound guidance at 8 o'clock in the left breast. A coil shaped biopsy marking clip is positioned within the mass biopsied  under ultrasound guidance at 10 o'clock in the right breast. IMPRESSION: 1. Appropriate positioning of the X shaped biopsy marking clip at the site of calcifications in the lower inner left breast. 2. Appropriate positioning of the ribbon shaped biopsy marking clip within the mass at 8 o'clock in the left breast. 3. Appropriate positioning of the coil shaped biopsy marking clip at the site of the biopsied mass at 10 o'clock in the right breast. Final Assessment: Post Procedure Mammograms for Marker Placement Electronically Signed   By: Ammie Ferrier M.D.   On: 05/14/2016 11:03   Mm Lt Breast Bx W Loc Dev 1st Lesion Image Bx Spec Stereo Guide  Addendum Date: 05/19/2016   ADDENDUM REPORT: 05/17/2016 14:15 ADDENDUM: Pathology revealed FIBROCYSTIC CHANGES WITH CALCIFICATIONS, PSEUDOANGIOMATOUS STROMAL HYPERPLASIA ([employer]) of the Left breast, lower inner quadrant. INVASIVE DUCTAL CARCINOMA, GRADE II/III, LYMPHOVASCULAR SPACE INVOLVEMENT BY TUMOR of the Left breast, 8:00 o'clock. PSEUDOANGIOMATOUS STROMAL HYPERPLASIA ([place]), FIBROCYSTIC CHANGES of the Right breast, 10:00 o'clock. This was found to be concordant by Dr. Ammie Ferrier. Pathology results were discussed with the patient by telephone. The patient reported doing well after the biopsies with tenderness at the sites. Post biopsy instructions and care were reviewed and questions were  answered. The patient was encouraged to call [facility] for any additional concerns. Surgical consultation has been arranged with Dr. Nedra Hai at [facility] on June 02, 2016. Pathology results reported by Terie Purser, RN on 05/17/2016. Electronically Signed   By: Ammie Ferrier M.D.   On: 05/17/2016 14:15   Result Date: 05/19/2016 CLINICAL DATA:  76 year old female presenting for stereotactic biopsy of left breast calcifications. EXAM: LEFT BREAST STEREOTACTIC CORE NEEDLE BIOPSY COMPARISON:  Previous exams. FINDINGS: The patient and I discussed the procedure of stereotactic-guided biopsy including benefits and alternatives. We discussed the high likelihood of a successful procedure. We discussed the risks of the procedure including infection, bleeding, tissue injury, clip migration, and inadequate sampling. Informed written consent was given. The usual time out protocol was performed immediately prior to the procedure. Using sterile technique and 1% Lidocaine as local anesthetic, under stereotactic guidance, a 9 gauge vacuum assisted device was used to perform core needle biopsy of calcifications in the lower inner quadrant of the left breast using a medial approach. Specimen radiograph was performed showing calcifications within 3 samples. Specimens with calcifications are identified for pathology. At the conclusion of the procedure, a X shaped tissue marker clip was deployed into the biopsy cavity. Following this stereotactic biopsy, the patient had 2 ultrasound-guided biopsies which will be dictated separately. Follow-up 2-view mammogram was performed and dictated separately. IMPRESSION: Stereotactic-guided biopsy of calcifications in the lower-inner left breast. No apparent complications. Electronically Signed: By: Ammie Ferrier M.D. On: 05/14/2016 10:57   [place] Lt Breast Bx W Loc Dev 1st Lesion Img Bx Spec US Guide  Result Date: 05/14/2016 CLINICAL  DATA:  76 year old female presenting for ultrasound-guided biopsies of a left breast mass at 8 o'clock in the right breast mass at 10 o'clock. EXAM: ULTRASOUND GUIDED BILATERAL BREAST CORE NEEDLE BIOPSY COMPARISON:  Previous exam(s). FINDINGS: I met with the patient and we discussed the procedure of ultrasound-guided biopsy, including benefits and alternatives. We discussed the high likelihood of a successful procedure. We discussed the risks of the procedure, including infection, bleeding, tissue injury, clip migration, and inadequate sampling. Informed written consent was given. The usual time-out protocol was performed immediately prior to the procedure. Using sterile technique and 1%  Lidocaine as local anesthetic, under direct ultrasound visualization, a 14 gauge spring-loaded device was used to perform biopsy of a left breast mass at 8 o'clock using a superior approach through the same incision site has used for the stereotactic biopsy. At the conclusion of the procedure a ribbon shaped tissue marker clip was deployed into the biopsy cavity. Using sterile technique and 1% Lidocaine as local anesthetic, under direct ultrasound visualization, a 14 gauge spring-loaded device was used to perform biopsy of the right breast mass at 10 o'clock using a medial approach. At the conclusion of the procedure a coil shaped tissue marker clip was deployed into the biopsy cavity. Follow up 2 view mammogram was performed and dictated separately. IMPRESSION: 1. Ultrasound guided biopsy of a left breast mass at 8 o'clock. No apparent complications. 2. Ultrasound-guided biopsy of a right breast mass at 10 o'clock. No apparent complications. Electronically Signed   By: Ammie Ferrier M.D.   On: 05/14/2016 10:59   [place] Rt Breast Bx W Loc Dev 1st Lesion Img Bx Spec US Guide  Result Date: 05/14/2016 CLINICAL DATA:  76 year old female presenting for ultrasound-guided biopsies of a left breast mass at 8 o'clock in the right breast  mass at 10 o'clock. EXAM: ULTRASOUND GUIDED BILATERAL BREAST CORE NEEDLE BIOPSY COMPARISON:  Previous exam(s). FINDINGS: I met with the patient and we discussed the procedure of ultrasound-guided biopsy, including benefits and alternatives. We discussed the high likelihood of a successful procedure. We discussed the risks of the procedure, including infection, bleeding, tissue injury, clip migration, and inadequate sampling. Informed written consent was given. The usual time-out protocol was performed immediately prior to the procedure. Using sterile technique and 1% Lidocaine as local anesthetic, under direct ultrasound visualization, a 14 gauge spring-loaded device was used to perform biopsy of a left breast mass at 8 o'clock using a superior approach through the same incision site has used for the stereotactic biopsy. At the conclusion of the procedure a ribbon shaped tissue marker clip was deployed into the biopsy cavity. Using sterile technique and 1% Lidocaine as local anesthetic, under direct ultrasound visualization, a 14 gauge spring-loaded device was used to perform biopsy of the right breast mass at 10 o'clock using a medial approach. At the conclusion of the procedure a coil shaped tissue marker clip was deployed into the biopsy cavity. Follow up 2 view mammogram was performed and dictated separately. IMPRESSION: 1. Ultrasound guided biopsy of a left breast mass at 8 o'clock. No apparent complications. 2. Ultrasound-guided biopsy of a right breast mass at 10 o'clock. No apparent complications. Electronically Signed   By: Ammie Ferrier M.D.   On: 05/14/2016 10:59    Speciality Comments: No specialty comments available.    Procedures:  No procedures performed Allergies: Sulfa antibiotics and Penicillins   Assessment / Plan:     Visit Diagnoses: Rheumatoid arthritis of multiple sites without organ or system involvement with positive rheumatoid factor (HCC) - Positive CCP, positive  ANA  High risk medication use - IV Orencia and Prednisone   Normocytic anemia  Age-related osteoporosis without current pathological fracture - Fosamax, June 2016 T score -4.3 left radius  Closed subcapital fracture of left femur, sequela  History of asthma    Orders: No orders of the defined types were placed in this encounter.  No orders of the defined types were placed in this encounter.   Face-to-face time spent with patient was *** minutes. 50% of time was spent in counseling and coordination of care.  Follow-Up Instructions: No Follow-up on file.   Bo Merino, MD  Note - This record has been created using Editor, commissioning.  Chart creation errors have been sought, but may not always  have been located. Such creation errors do not reflect on  the standard of medical care.

## 2016-06-09 ENCOUNTER — Encounter: Payer: Self-pay | Admitting: Rheumatology

## 2016-06-09 ENCOUNTER — Ambulatory Visit: Payer: Medicare Other | Admitting: Rheumatology

## 2016-06-09 ENCOUNTER — Other Ambulatory Visit: Payer: Self-pay | Admitting: Surgery

## 2016-06-09 ENCOUNTER — Ambulatory Visit (INDEPENDENT_AMBULATORY_CARE_PROVIDER_SITE_OTHER): Payer: Medicare Other | Admitting: Rheumatology

## 2016-06-09 VITALS — BP 150/92 | HR 100 | Resp 16 | Ht 61.0 in | Wt 140.0 lb

## 2016-06-09 DIAGNOSIS — M810 Age-related osteoporosis without current pathological fracture: Secondary | ICD-10-CM | POA: Diagnosis not present

## 2016-06-09 DIAGNOSIS — M0579 Rheumatoid arthritis with rheumatoid factor of multiple sites without organ or systems involvement: Secondary | ICD-10-CM

## 2016-06-09 DIAGNOSIS — Z79899 Other long term (current) drug therapy: Secondary | ICD-10-CM | POA: Diagnosis not present

## 2016-06-09 DIAGNOSIS — J4540 Moderate persistent asthma, uncomplicated: Secondary | ICD-10-CM | POA: Diagnosis not present

## 2016-06-09 DIAGNOSIS — S72012S Unspecified intracapsular fracture of left femur, sequela: Secondary | ICD-10-CM

## 2016-06-09 DIAGNOSIS — Z170 Estrogen receptor positive status [ER+]: Principal | ICD-10-CM

## 2016-06-09 DIAGNOSIS — C50912 Malignant neoplasm of unspecified site of left female breast: Secondary | ICD-10-CM

## 2016-06-09 MED ORDER — LEFLUNOMIDE 20 MG PO TABS: 20.0000 mg | ORAL_TABLET | Freq: Every day | ORAL | 0 refills | Status: DC

## 2016-06-09 NOTE — Patient Instructions (Signed)
Standing Labs We placed an order today for your standing lab work.    Please come back and get your standing labs in 2 weeks after starting [place] then every 2 months  We have open lab Monday through Friday from 8:30-11:30 AM and 1:30-4 PM at the office of Dr. Tresa Moore, PA.   The office is located at [address], [place], [place], [place] [address] No appointment is necessary.   Labs are drawn by [facility].  You may receive a bill from [facility] for your lab work.

## 2016-06-09 NOTE — Progress Notes (Signed)
Office Visit Note  Patient: Felicia Gonzalez             Date of Birth: 27-Aug-1940           MRN: [id number]             PCP: Redmond Baseman, MD Referring: Ileana Ladd, MD Visit Date: 06/09/2016 Occupation: @GUAROCC @    Subjective:  Hand stiffness   History of Present Illness: Felicia Gonzalez is a 76 y.o. female with history of rheumatoid arthritis. She has done really well on IV Orencia. She's been off Arava as it did not work for her. She has been tapering prednisone gradually and is down to 3 mg by mouth daily. She has been diagnosed with him breast cancer stage I left breast recently she has not seen a oncologist for that. She also had recent DEXA sedation of asthma and bronchitis. Which was treated with Zithromax and inhalers.  Activities of Daily Living:  Patient reports morning stiffness for 0 minute.   Patient Denies nocturnal pain.  Difficulty dressing/grooming: Denies Difficulty climbing stairs: Denies Difficulty getting out of chair: Denies Difficulty using hands for taps, buttons, cutlery, and/or writing: Reports   Review of Systems  Constitutional: Positive for fatigue. Negative for night sweats, weight gain, weight loss and weakness.  HENT: Negative for mouth sores, trouble swallowing, trouble swallowing, mouth dryness and nose dryness.   Eyes: Negative for pain, redness, visual disturbance and dryness.  Respiratory: Positive for cough and shortness of breath. Negative for difficulty breathing.   Cardiovascular: Negative for chest pain, palpitations, hypertension, irregular heartbeat and swelling in legs/feet.  Gastrointestinal: Negative for blood in stool, constipation and diarrhea.  Endocrine: Negative for increased urination.  Genitourinary: Negative for vaginal dryness.  Musculoskeletal: Positive for arthralgias and joint pain. Negative for joint swelling, myalgias, muscle weakness, morning stiffness, muscle tenderness and myalgias.  Skin: Negative for  color change, rash, hair loss, skin tightness, ulcers and sensitivity to sunlight.  Allergic/Immunologic: Negative for susceptible to infections.  Neurological: Negative for dizziness, memory loss and night sweats.  Hematological: Negative for swollen glands.  Psychiatric/Behavioral: Negative for depressed mood and sleep disturbance. The patient is not nervous/anxious.     PMFS History:  Patient Active Problem List   Diagnosis Date Noted  . High risk medication use 06/08/2016  . Age-related osteoporosis without current pathological fracture 06/08/2016  . ANA positive 06/08/2016  . Moderate persistent asthma 06/08/2016  . Normocytic anemia 10/01/2014  . Rheumatoid arthritis of multiple sites without organ or system involvement with positive rheumatoid factor (HCC) 10/01/2014  . Hip fracture (HCC) 10/01/2014  . Subcapital fracture of left hip (HCC) 10/01/2014  . Closed left hip fracture (HCC) 09/30/2014    Past Medical History:  Diagnosis Date  . Anemia   . Arthritis    rheumatoid +CCP neg RF +ANA synovitis   . Asthma   . Glaucoma   . Osteoporosis     Family History  Problem Relation Age of Onset  . Hypertension Father    Past Surgical History:  Procedure Laterality Date  . NO PAST SURGERIES    . TOTAL HIP ARTHROPLASTY Left 10/01/2014   Procedure: TOTAL HIP ARTHROPLASTY;  Surgeon: 12/01/2014, MD;  Location: [facility] OR;  Service: Orthopedics;  Laterality: Left;   Social History   Social History Narrative  . No narrative on file     Objective: Vital Signs: BP (!) 150/92   Pulse 100   Resp 16   Ht 5'  1" (1.549 m)   Wt 140 lb (63.5 kg)   BMI 26.45 kg/m    Physical Exam  Constitutional: She is oriented to person, place, and time. She appears well-developed and well-nourished.  HENT:  Head: Normocephalic and atraumatic.  Eyes: Conjunctivae and EOM are normal.  Neck: Normal range of motion.  Cardiovascular: Normal rate, regular rhythm, normal heart sounds and  intact distal pulses.   Pulmonary/Chest: Effort normal and breath sounds normal.  Abdominal: Soft. Bowel sounds are normal.  Lymphadenopathy:    She has no cervical adenopathy.  Neurological: She is alert and oriented to person, place, and time.  Skin: Skin is warm and dry. Capillary refill takes less than 2 seconds.  Psychiatric: She has a normal mood and affect. Her behavior is normal.  Nursing note and vitals reviewed.    Musculoskeletal Exam: C-spine and thoracic lumbar spine good range of motion. She has good range of motion of her shoulders elbows. She has limited range of motion of her right wrist joint. She has thickening of PIP/DIP joints with no synovitis today. Her left hip replacement is doing well. She has no swelling or synovitis in her knee joints. Ankle joints MTPs PIPs DIPs with good range of motion with no synovitis.  CDAI Exam: CDAI Homunculus Exam:   Joint Counts:  CDAI Tender Joint count: 0 CDAI Swollen Joint count: 0  Global Assessments:  Patient Global Assessment: 2 Provider Global Assessment: 2  CDAI Calculated Score: 4    Investigation: Findings:  04/12/2016 CBC hemoglobin 10.7, CMP normal, 03/10/2016 TB gold negative    Imaging: US Breast Ltd Uni Left Inc Axilla  Result Date: 05/12/2016 CLINICAL DATA:  Followup for probably benign right breast mass and probably benign left breast calcifications. EXAM: 2D DIGITAL DIAGNOSTIC BILATERAL MAMMOGRAM WITH CAD AND ADJUNCT TOMO BILATERAL BREAST ULTRASOUND COMPARISON:  Previous exam(s). ACR Breast Density Category c: The breast tissue is heterogeneously dense, which may obscure small masses. FINDINGS: No suspicious masses or calcifications are seen in the right breast. The oval circumscribed mass in the upper-outer posterior right breast appears unchanged. Spot compression magnification views were performed over the inner left breast demonstrating unchanged 3 mm group of punctate calcifications. There is a mass  with irregular margins in the lower inner left breast measuring approximately 7-8 mm. Mammographic images were processed with CAD. Physical examination of the left breast reveals a firm nodule at the 8 o'clock position. Targeted ultrasound of the left breast was performed demonstrating an irregular hypoechoic mass at 8 o'clock 2 cm from nipple measuring 0.8 x 0.6 x 0.7 cm. This corresponds well with the mass seen at mammography. No lymphadenopathy seen in the left axilla. Targeted ultrasound of the right breast was performed demonstrating a mixed echogenicity mass at 10 o'clock 8 cm from nipple measuring 1.4 x 0.6 x 1.1 cm. Although this may represent an area of fat necrosis is indeterminate. No lymphadenopathy seen in the right axilla. IMPRESSION: 1. Suspicious mass in the left breast at the 8 o'clock position. 2. Stable appearance of punctate calcifications in the lower inner left breast. 3. Indeterminate mass in the right breast at 10 o'clock 8 cm from the nipple which cannot be clearly characterized as an area of fat necrosis. RECOMMENDATION: 1. Recommend ultrasound-guided biopsy of the mass in the left breast at the 8 o'clock position. 2. Recommend stereotactic guided biopsy of the calcifications in the lower inner left breast (given the suspicious mass seen in close proximity to the calcifications). 3. Recommend ultrasound-guided  biopsy of the mass in the right breast at 10 o'clock position. Biopsies are being scheduled for the patient. I have discussed the findings and recommendations with the patient. Results were also provided in writing at the conclusion of the visit. If applicable, a reminder letter will be sent to the patient regarding the next appointment. BI-RADS CATEGORY  4: Suspicious. Electronically Signed   By: Everlean Alstrom M.D.   On: 05/12/2016 13:56   US Breast Ltd Uni Right Inc Axilla  Result Date: 05/12/2016 CLINICAL DATA:  Followup for probably benign right breast mass and probably  benign left breast calcifications. EXAM: 2D DIGITAL DIAGNOSTIC BILATERAL MAMMOGRAM WITH CAD AND ADJUNCT TOMO BILATERAL BREAST ULTRASOUND COMPARISON:  Previous exam(s). ACR Breast Density Category c: The breast tissue is heterogeneously dense, which may obscure small masses. FINDINGS: No suspicious masses or calcifications are seen in the right breast. The oval circumscribed mass in the upper-outer posterior right breast appears unchanged. Spot compression magnification views were performed over the inner left breast demonstrating unchanged 3 mm group of punctate calcifications. There is a mass with irregular margins in the lower inner left breast measuring approximately 7-8 mm. Mammographic images were processed with CAD. Physical examination of the left breast reveals a firm nodule at the 8 o'clock position. Targeted ultrasound of the left breast was performed demonstrating an irregular hypoechoic mass at 8 o'clock 2 cm from nipple measuring 0.8 x 0.6 x 0.7 cm. This corresponds well with the mass seen at mammography. No lymphadenopathy seen in the left axilla. Targeted ultrasound of the right breast was performed demonstrating a mixed echogenicity mass at 10 o'clock 8 cm from nipple measuring 1.4 x 0.6 x 1.1 cm. Although this may represent an area of fat necrosis is indeterminate. No lymphadenopathy seen in the right axilla. IMPRESSION: 1. Suspicious mass in the left breast at the 8 o'clock position. 2. Stable appearance of punctate calcifications in the lower inner left breast. 3. Indeterminate mass in the right breast at 10 o'clock 8 cm from the nipple which cannot be clearly characterized as an area of fat necrosis. RECOMMENDATION: 1. Recommend ultrasound-guided biopsy of the mass in the left breast at the 8 o'clock position. 2. Recommend stereotactic guided biopsy of the calcifications in the lower inner left breast (given the suspicious mass seen in close proximity to the calcifications). 3. Recommend  ultrasound-guided biopsy of the mass in the right breast at 10 o'clock position. Biopsies are being scheduled for the patient. I have discussed the findings and recommendations with the patient. Results were also provided in writing at the conclusion of the visit. If applicable, a reminder letter will be sent to the patient regarding the next appointment. BI-RADS CATEGORY  4: Suspicious. Electronically Signed   By: Everlean Alstrom M.D.   On: 05/12/2016 13:56   Mm Diag Breast Tomo Bilateral  Result Date: 05/12/2016 CLINICAL DATA:  Followup for probably benign right breast mass and probably benign left breast calcifications. EXAM: 2D DIGITAL DIAGNOSTIC BILATERAL MAMMOGRAM WITH CAD AND ADJUNCT TOMO BILATERAL BREAST ULTRASOUND COMPARISON:  Previous exam(s). ACR Breast Density Category c: The breast tissue is heterogeneously dense, which may obscure small masses. FINDINGS: No suspicious masses or calcifications are seen in the right breast. The oval circumscribed mass in the upper-outer posterior right breast appears unchanged. Spot compression magnification views were performed over the inner left breast demonstrating unchanged 3 mm group of punctate calcifications. There is a mass with irregular margins in the lower inner left breast measuring approximately 7-8  mm. Mammographic images were processed with CAD. Physical examination of the left breast reveals a firm nodule at the 8 o'clock position. Targeted ultrasound of the left breast was performed demonstrating an irregular hypoechoic mass at 8 o'clock 2 cm from nipple measuring 0.8 x 0.6 x 0.7 cm. This corresponds well with the mass seen at mammography. No lymphadenopathy seen in the left axilla. Targeted ultrasound of the right breast was performed demonstrating a mixed echogenicity mass at 10 o'clock 8 cm from nipple measuring 1.4 x 0.6 x 1.1 cm. Although this may represent an area of fat necrosis is indeterminate. No lymphadenopathy seen in the right axilla.  IMPRESSION: 1. Suspicious mass in the left breast at the 8 o'clock position. 2. Stable appearance of punctate calcifications in the lower inner left breast. 3. Indeterminate mass in the right breast at 10 o'clock 8 cm from the nipple which cannot be clearly characterized as an area of fat necrosis. RECOMMENDATION: 1. Recommend ultrasound-guided biopsy of the mass in the left breast at the 8 o'clock position. 2. Recommend stereotactic guided biopsy of the calcifications in the lower inner left breast (given the suspicious mass seen in close proximity to the calcifications). 3. Recommend ultrasound-guided biopsy of the mass in the right breast at 10 o'clock position. Biopsies are being scheduled for the patient. I have discussed the findings and recommendations with the patient. Results were also provided in writing at the conclusion of the visit. If applicable, a reminder letter will be sent to the patient regarding the next appointment. BI-RADS CATEGORY  4: Suspicious. Electronically Signed   By: Everlean Alstrom M.D.   On: 05/12/2016 13:56   Mm Clip Placement Left  Result Date: 05/14/2016 CLINICAL DATA:  Post biopsy mammogram of the bilateral breasts for clip placement. EXAM: DIAGNOSTIC BILATERAL MAMMOGRAM POST ULTRASOUND AND STEREOTACTIC BIOPSY COMPARISON:  Previous exam(s). FINDINGS: Mammographic images were obtained following stereotactic guided biopsy of calcifications in the lower-inner left breast, as well as ultrasound-guided biopsy of a left breast mass at 8 o'clock in and a right breast mass at 10 o'clock. The X shaped biopsy marking clip is appropriately positioned at the site of the biopsied calcifications in the lower inner left breast. A ribbon shaped biopsy marking clip is positioned within the mass biopsied under ultrasound guidance at 8 o'clock in the left breast. A coil shaped biopsy marking clip is positioned within the mass biopsied under ultrasound guidance at 10 o'clock in the right  breast. IMPRESSION: 1. Appropriate positioning of the X shaped biopsy marking clip at the site of calcifications in the lower inner left breast. 2. Appropriate positioning of the ribbon shaped biopsy marking clip within the mass at 8 o'clock in the left breast. 3. Appropriate positioning of the coil shaped biopsy marking clip at the site of the biopsied mass at 10 o'clock in the right breast. Final Assessment: Post Procedure Mammograms for Marker Placement Electronically Signed   By: Ammie Ferrier M.D.   On: 05/14/2016 11:03   Mm Clip Placement Right  Result Date: 05/14/2016 CLINICAL DATA:  Post biopsy mammogram of the bilateral breasts for clip placement. EXAM: DIAGNOSTIC BILATERAL MAMMOGRAM POST ULTRASOUND AND STEREOTACTIC BIOPSY COMPARISON:  Previous exam(s). FINDINGS: Mammographic images were obtained following stereotactic guided biopsy of calcifications in the lower-inner left breast, as well as ultrasound-guided biopsy of a left breast mass at 8 o'clock in and a right breast mass at 10 o'clock. The X shaped biopsy marking clip is appropriately positioned at the site of the biopsied  calcifications in the lower inner left breast. A ribbon shaped biopsy marking clip is positioned within the mass biopsied under ultrasound guidance at 8 o'clock in the left breast. A coil shaped biopsy marking clip is positioned within the mass biopsied under ultrasound guidance at 10 o'clock in the right breast. IMPRESSION: 1. Appropriate positioning of the X shaped biopsy marking clip at the site of calcifications in the lower inner left breast. 2. Appropriate positioning of the ribbon shaped biopsy marking clip within the mass at 8 o'clock in the left breast. 3. Appropriate positioning of the coil shaped biopsy marking clip at the site of the biopsied mass at 10 o'clock in the right breast. Final Assessment: Post Procedure Mammograms for Marker Placement Electronically Signed   By: Ammie Ferrier M.D.   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 INVASIVE DUCTAL CARCINOMA, GRADE II/III, LYMPHOVASCULAR SPACE INVOLVEMENT BY TUMOR of the Left breast, 8:00 o'clock. PSEUDOANGIOMATOUS STROMAL HYPERPLASIA ([place]), FIBROCYSTIC CHANGES of the Right breast, 10:00 o'clock. This was found to be concordant by Dr. Ammie Ferrier. Pathology results were discussed with the patient by telephone. The patient reported doing well after the biopsies with tenderness at the sites. Post biopsy instructions and care were reviewed and questions were answered. The patient was encouraged to call [facility] for any additional concerns. Surgical consultation has been arranged with Dr. Nedra Hai at [facility] on June 02, 2016. Pathology results reported by Terie Purser, RN on 05/17/2016. Electronically Signed   By: Ammie Ferrier M.D.   On: 05/17/2016 14:15   Result Date: 05/19/2016 CLINICAL DATA:  76 year old female presenting for stereotactic biopsy of left breast calcifications. EXAM: LEFT BREAST STEREOTACTIC CORE NEEDLE BIOPSY COMPARISON:  Previous exams. FINDINGS: The patient and I discussed the procedure of stereotactic-guided biopsy including benefits and alternatives. We discussed the high likelihood of a successful procedure. We discussed the risks of the procedure including infection, bleeding, tissue injury, clip migration, and inadequate sampling. Informed written consent was given. The usual time out protocol was performed immediately prior to the procedure. Using sterile technique and 1% Lidocaine as local anesthetic, under stereotactic guidance, a 9 gauge vacuum assisted device was used to perform core needle biopsy  of calcifications in the lower inner quadrant of the left breast using a medial approach. Specimen radiograph was performed showing calcifications within 3 samples. Specimens with calcifications are identified for pathology. At the conclusion of the procedure, a X shaped tissue marker clip was deployed into the biopsy cavity. Following this stereotactic biopsy, the patient had 2 ultrasound-guided biopsies which will be dictated separately. Follow-up 2-view mammogram was performed and dictated separately. IMPRESSION: Stereotactic-guided biopsy of calcifications in the lower-inner left breast. No apparent complications. Electronically Signed: By: Ammie Ferrier M.D. On: 05/14/2016 10:57   [place] Lt Breast Bx W Loc Dev 1st Lesion Img Bx Spec US Guide  Result Date: 05/14/2016 CLINICAL DATA:  76 year old female presenting for ultrasound-guided biopsies of a left breast mass at 8 o'clock in the right breast mass at 10 o'clock. EXAM: ULTRASOUND GUIDED BILATERAL BREAST CORE NEEDLE BIOPSY COMPARISON:  Previous exam(s). FINDINGS: I met with the patient and we discussed the procedure of ultrasound-guided biopsy, including benefits and alternatives. We discussed the high likelihood of  a successful procedure. We discussed the risks of the procedure, including infection, bleeding, tissue injury, clip migration, and inadequate sampling. Informed written consent was given. The usual time-out protocol was performed immediately prior to the procedure. Using sterile technique and 1% Lidocaine as local anesthetic, under direct ultrasound visualization, a 14 gauge spring-loaded device was used to perform biopsy of a left breast mass at 8 o'clock using a superior approach through the same incision site has used for the stereotactic biopsy. At the conclusion of the procedure a ribbon shaped tissue marker clip was deployed into the biopsy cavity. Using sterile technique and 1% Lidocaine as local anesthetic, under direct ultrasound  visualization, a 14 gauge spring-loaded device was used to perform biopsy of the right breast mass at 10 o'clock using a medial approach. At the conclusion of the procedure a coil shaped tissue marker clip was deployed into the biopsy cavity. Follow up 2 view mammogram was performed and dictated separately. IMPRESSION: 1. Ultrasound guided biopsy of a left breast mass at 8 o'clock. No apparent complications. 2. Ultrasound-guided biopsy of a right breast mass at 10 o'clock. No apparent complications. Electronically Signed   By: Ammie Ferrier M.D.   On: 05/14/2016 10:59   [place] Rt Breast Bx W Loc Dev 1st Lesion Img Bx Spec US Guide  Result Date: 05/14/2016 CLINICAL DATA:  76 year old female presenting for ultrasound-guided biopsies of a left breast mass at 8 o'clock in the right breast mass at 10 o'clock. EXAM: ULTRASOUND GUIDED BILATERAL BREAST CORE NEEDLE BIOPSY COMPARISON:  Previous exam(s). FINDINGS: I met with the patient and we discussed the procedure of ultrasound-guided biopsy, including benefits and alternatives. We discussed the high likelihood of a successful procedure. We discussed the risks of the procedure, including infection, bleeding, tissue injury, clip migration, and inadequate sampling. Informed written consent was given. The usual time-out protocol was performed immediately prior to the procedure. Using sterile technique and 1% Lidocaine as local anesthetic, under direct ultrasound visualization, a 14 gauge spring-loaded device was used to perform biopsy of a left breast mass at 8 o'clock using a superior approach through the same incision site has used for the stereotactic biopsy. At the conclusion of the procedure a ribbon shaped tissue marker clip was deployed into the biopsy cavity. Using sterile technique and 1% Lidocaine as local anesthetic, under direct ultrasound visualization, a 14 gauge spring-loaded device was used to perform biopsy of the right breast mass at 10 o'clock using a  medial approach. At the conclusion of the procedure a coil shaped tissue marker clip was deployed into the biopsy cavity. Follow up 2 view mammogram was performed and dictated separately. IMPRESSION: 1. Ultrasound guided biopsy of a left breast mass at 8 o'clock. No apparent complications. 2. Ultrasound-guided biopsy of a right breast mass at 10 o'clock. No apparent complications. Electronically Signed   By: Ammie Ferrier M.D.   On: 05/14/2016 10:59    Speciality Comments: No specialty comments available.    Procedures:  No procedures performed Allergies: Sulfa antibiotics and Penicillins   Assessment / Plan:     Visit Diagnoses: Rheumatoid arthritis of multiple sites without organ or system involvement with positive rheumatoid factor (HCC) - Positive CCP, positive ANA she is doing really well on IV Orencia with no synovitis on examination. Unfortunately due to recent diagnosis of a stage I breast cancer we will have to discontinue IV Orencia. I will restart her on Arava 20 mg by mouth daily. She has tried that in the past and  failed the medication. As she is almost in remission am hoping Jolee Ewing will maintain her arthritis.  High risk medication use - Orencia IV start date July 2017 it was recently discontinued due to bronchitis and  asthma., prednisone 3 mg by mouth daily. She's been tapering her prednisone and will continue it prednisone taper as tolerated. I will refill Arava and prednisone today. We will check labs in 2 weeks then every 2 months.  Age-related osteoporosis without current pathological fracture - Fosamax 70 mg by mouth every week, June 2016 T score -4.3 left radius  Moderate persistent asthma, unspecified whether complicated  Closed subcapital fracture of left femur, sequela    Orders: No orders of the defined types were placed in this encounter.  No orders of the defined types were placed in this encounter.   Face-to-face time spent with patient was 30 minutes. 50%  of time was spent in counseling and coordination of care.  Follow-Up Instructions: Return in about 3 months (around 09/07/2016) for Rheumatoid arthritis.   Bo Merino, MD  Note - This record has been created using Editor, commissioning.  Chart creation errors have been sought, but may not always  have been located. Such creation errors do not reflect on  the standard of medical care.

## 2016-06-10 ENCOUNTER — Telehealth (INDEPENDENT_AMBULATORY_CARE_PROVIDER_SITE_OTHER): Payer: Self-pay | Admitting: Rheumatology

## 2016-06-10 NOTE — Telephone Encounter (Signed)
Patient says pharmacy ([facility] on [address]) did not receive her prednisone rx.  Cb#: [phone]

## 2016-06-10 NOTE — Telephone Encounter (Signed)
Patient advised to check with her pharmacy. Prescription was sent for Prednisone on 05/14/16 with 2 additional refills. Patient verbalized understanding.

## 2016-06-11 ENCOUNTER — Telehealth: Payer: Self-pay | Admitting: Hematology

## 2016-06-11 ENCOUNTER — Telehealth: Payer: Self-pay | Admitting: Radiation Oncology

## 2016-06-11 NOTE — Telephone Encounter (Signed)
Patient advised she wants to see Irene Limbo before she makes appointment with Dr Lisbeth Renshaw, will call later to schedule if needed

## 2016-06-11 NOTE — Telephone Encounter (Signed)
New breast pt. Last seen [place] in 2016. Per MD ok to schedule the pt to see him w/new dx. Pt has been  Scheduled to see Kale on 1/24 at 130pm. I offered 1/26 at 10am, but states she could only come in on 1/24. She agreed to the appt date and time.

## 2016-06-21 ENCOUNTER — Other Ambulatory Visit: Payer: Self-pay | Admitting: Hematology

## 2016-06-23 ENCOUNTER — Other Ambulatory Visit: Payer: Self-pay | Admitting: Surgery

## 2016-06-23 ENCOUNTER — Encounter: Payer: Self-pay | Admitting: Hematology

## 2016-06-23 ENCOUNTER — Ambulatory Visit (HOSPITAL_BASED_OUTPATIENT_CLINIC_OR_DEPARTMENT_OTHER): Payer: Medicare Other | Admitting: Hematology

## 2016-06-23 VITALS — BP 153/75 | HR 94 | Temp 98.4°F | Resp 18 | Wt 140.3 lb

## 2016-06-23 DIAGNOSIS — C50312 Malignant neoplasm of lower-inner quadrant of left female breast: Secondary | ICD-10-CM | POA: Diagnosis not present

## 2016-06-23 DIAGNOSIS — M810 Age-related osteoporosis without current pathological fracture: Secondary | ICD-10-CM | POA: Diagnosis not present

## 2016-06-23 DIAGNOSIS — Z170 Estrogen receptor positive status [ER+]: Secondary | ICD-10-CM

## 2016-06-23 NOTE — Progress Notes (Signed)
Felicia Gonzalez  HEMATOLOGY ONCOLOGY CLINIC NOTE  Date of service: .06/23/2016  Patient Care Team: Felicia Shanks, MD as PCP - General (Family Medicine)  Chief Complaint: f/u for management of Newly Diagnosed Breast Cancer  INTERVAL HISTORY:  [name] is here for evaluation and management of her newly diagnosed left breast cancer.  She has had mammographic abnormalities noted on routine screening mammograms since February 2017 including areas of calcifications. She had a follow-up diagnostic mammogram in December 2017 that showed a new area 8 mm in size at the 8:00 position in the left breast which was concerning for possible malignancy. She subsequently had stereotactic biopsies of 3 areas of the left breast. No mass at the 8:00 position was noted to show an invasive ductal carcinoma, grade 2 out of 3 with lymphovascular space involvement. ER/PR positive HER-2/neu negative. The lesion was noted to be about 8 mm in size with ultrasound of axilla showing no abnormal lymph nodes.  She notes no nipple discharge, no breast pain or skin changes. She notes no other concerning new focal symptoms including bone pains headaches focal neurological deficits no abdominal pain no chest pain.  Notes that her rheumatoid arthritis has been fairly stable. She is not 3 mg daily and prednisone. Currently on Arava. Previously was on Orencia.  REVIEW OF SYSTEMS:    10 Point review of systems of done and is negative except as noted above.  . Past Medical History:  Diagnosis Date  . Anemia   . Arthritis    rheumatoid +CCP neg RF +ANA synovitis   . Asthma   . Glaucoma   . Osteoporosis      . Past Surgical History:  Procedure Laterality Date  . NO PAST SURGERIES    . TOTAL HIP ARTHROPLASTY Left 10/01/2014   Procedure: TOTAL HIP ARTHROPLASTY;  Surgeon: Garald Balding, MD;  Location: [facility];  Service: Orthopedics;  Laterality: Left;    . Social History  Substance Use Topics  . Smoking status: Never Smoker  .  Smokeless tobacco: Not on file  . Alcohol use No    ALLERGIES:  is allergic to sulfa antibiotics and penicillins.  MEDICATIONS:  Current Outpatient Prescriptions  Medication Sig Dispense Refill  . acetaminophen (TYLENOL) 650 MG CR tablet Take 650 mg by mouth as needed. Take one hour prior to infusion     . alendronate (FOSAMAX) 70 MG tablet Take 1 tablet (70 mg total) by mouth once a week. Take with a full glass of water on an empty stomach. 12 tablet 0  . Cholecalciferol (VITAMIN D) 2000 units CAPS Take 2,000 Units by mouth.    Felicia Gonzalez ipratropium (ATROVENT) 0.03 % nasal spray     . Multiple Vitamin (MULTIVITAMIN) capsule Take 1 capsule by mouth daily.    Felicia Gonzalez omega-3 acid ethyl esters (LOVAZA) 1 G capsule Take by mouth 2 (two) times daily.    . predniSONE (DELTASONE) 1 MG tablet Take 4 tablets (4 mg total) by mouth daily. (Patient taking differently: Take 3 mg by mouth daily. ) 120 tablet 2  . saccharomyces boulardii (FLORASTOR) 250 MG capsule Take 250 mg by mouth 2 (two) times daily.    . travoprost, benzalkonium, (TRAVATAN) 0.004 % ophthalmic solution 1 drop at bedtime.    Felicia Gonzalez UNABLE TO FIND Take by mouth daily. Tumeric    . leflunomide (ARAVA) 20 MG tablet Take 1 tablet (20 mg total) by mouth daily. 90 tablet 0   No current facility-administered medications for this visit.  PHYSICAL EXAMINATION: ECOG PERFORMANCE STATUS: 1 - Symptomatic but completely ambulatory  . Vitals:   06/23/16 1351  BP: (!) 153/75  Pulse: 94  Resp: 18  Temp: 98.4 F (36.9 C)    Filed Weights   06/23/16 1351  Weight: 140 lb 4.8 oz (63.6 kg)   .Body mass index is 26.51 kg/m.  GENERAL:alert, in no acute distress and comfortable SKIN: skin color, texture, turgor are normal, no rashes or significant lesions EYES: normal, conjunctiva are pink and non-injected, sclera clear OROPHARYNX:no exudate, no erythema and lips, buccal mucosa, and tongue normal  NECK: supple, no JVD, thyroid normal size, non-tender,  without nodularity LYMPH:  no palpable lymphadenopathy in the cervical, axillary or inguinal LUNGS: clear to auscultation with normal respiratory effort HEART: regular rate & rhythm,  no murmurs and no lower extremity edema ABDOMEN: abdomen soft, non-tender, normoactive bowel sounds  Musculoskeletal: appears to have active synovitis in her hands bilateral elbows and possibly left shoulder related to her rheumatoid arthritis PSYCH: alert & oriented x 3 with fluent speech NEURO: no focal motor/sensory deficits  LABORATORY DATA:   I have reviewed the data as listed  . CBC Latest Ref Rng & Units 04/12/2016 02/11/2016 12/15/2015  WBC 4.0 - 10.5 K/uL 7.6 6.6 8.9  Hemoglobin 12.0 - 15.0 g/dL 10.5(L) 10.3(L) 10.3(L)  Hematocrit 36.0 - 46.0 % 32.2(L) 32.4(L) 32.1(L)  Platelets 150 - 400 K/uL 294 289 294    . CMP Latest Ref Rng & Units 04/12/2016 02/11/2016 12/15/2015  Glucose 65 - 99 mg/dL 92 84 91  BUN 6 - 20 mg/dL _0 Creatinine 0.44 - 1.00 mg/dL 0.75 0.72 0.78  Sodium 135 - 145 mmol/L 138 139 136  Potassium 3.5 - 5.1 mmol/L 3.7 4.8 3.8  Chloride 101 - 111 mmol/L 103 104 102  CO2 22 - 32 mmol/L _1 Calcium 8.9 - 10.3 mg/dL 9.3 9.0 9.0  Total Protein 6.5 - 8.1 g/dL 6.4(L) 6.0(L) 6.5  Total Bilirubin 0.3 - 1.2 mg/dL 0.1(L) 1.4(H) 0.6  Alkaline Phos 38 - 126 U/L 98 87 75  AST 15 - 41 U/L 34 44(H) 34  ALT 14 - 54 U/L 36 31 29         CLINICAL DATA:  Followup for probably benign right breast mass and probably benign left breast calcifications.  EXAM: 2D DIGITAL DIAGNOSTIC BILATERAL MAMMOGRAM WITH CAD AND ADJUNCT TOMO  BILATERAL BREAST ULTRASOUND  COMPARISON:  Previous exam(s).  ACR Breast Density Category c: The breast tissue is heterogeneously dense, which may obscure small masses.  FINDINGS: No suspicious masses or calcifications are seen in the right breast. The oval circumscribed mass in the upper-outer posterior right breast appears unchanged. Spot  compression magnification views were performed over the inner left breast demonstrating unchanged 3 mm group of punctate calcifications. There is a mass with irregular margins in the lower inner left breast measuring approximately 7-8 mm.  Mammographic images were processed with CAD.  Physical examination of the left breast reveals a firm nodule at the 8 o'clock position.  Targeted ultrasound of the left breast was performed demonstrating an irregular hypoechoic mass at 8 o'clock 2 cm from nipple measuring 0.8 x 0.6 x 0.7 cm. This corresponds well with the mass seen at mammography. No lymphadenopathy seen in the left axilla.  Targeted ultrasound of the right breast was performed demonstrating a mixed echogenicity mass at 10 o'clock 8 cm from nipple measuring 1.4 x 0.6 x 1.1 cm. Although this may represent  an area of fat necrosis is indeterminate. No lymphadenopathy seen in the right axilla.  IMPRESSION: 1. Suspicious mass in the left breast at the 8 o'clock position.  2. Stable appearance of punctate calcifications in the lower inner left breast.  3. Indeterminate mass in the right breast at 10 o'clock 8 cm from the nipple which cannot be clearly characterized as an area of fat necrosis.  RECOMMENDATION: 1. Recommend ultrasound-guided biopsy of the mass in the left breast at the 8 o'clock position.  2. Recommend stereotactic guided biopsy of the calcifications in the lower inner left breast (given the suspicious mass seen in close proximity to the calcifications).  3. Recommend ultrasound-guided biopsy of the mass in the right breast at 10 o'clock position.  Biopsies are being scheduled for the patient.  I have discussed the findings and recommendations with the patient. Results were also provided in writing at the conclusion of the visit. If applicable, a reminder letter will be sent to the patient regarding the next appointment.  BI-RADS CATEGORY  4:  Suspicious.   Electronically Signed   By: Everlean Alstrom M.D.   On: 05/12/2016 13:56   RADIOGRAPHIC STUDIES: I have personally reviewed the radiological images as listed and agreed with the findings in the report. No results found.  ASSESSMENT & PLAN:  76 year old female with  #1 Newly diagnosed Stage I left sided invasive ductal carcinoma of the breast. 8 mm in size. Grade 2 over 3 with lymphovascular space invasion.  ER/PR positive HER-2/neu negative. Severe calcified benign lesions in left breast with PASH (pseudoangiomatous stromal hyperplasia). Plan -Patient has already been seen by Dr. Coralie Keens MD and was to be scheduled for a left sided lumpectomy with left Axillary SLNB she had several questions about total mastectomy versus lumpectomy which were answered in details . My RN called Dr. Trevor Mace office to help schedule patient for surgery. -She has been referred to radiation oncology consider postlumpectomy adjuvant radiation options. -Given her age and her breast cancer staging at this time and her personal preferences would likely not be a candidate for adjuvant chemotherapy. -Would recommend adjuvant endocrine therapy after completion of surgery and adjuvant RT is completed. -We shall see her back in 2 months to discuss adjuvant endocrine treatment options. -Aromatase inhibitor would probably be the best choice the patient has rheumatoid arthritis with chronic prednisone use and osteoporosis and will need reevaluation of her bone health prior to final decision. -Would likely need stress dose steroids perioperatively given chronic steroid use. -I recommended she discuss with her rheumatologist Dr. Estanislado Pandy Re: Possibly holding her Arava perioperatively to reduce risk of infections and possibly improve wound healing.   #2 Rheumatoid arthritis currently on Arava and prednisone. Was previously on Orencia. Plan -Continue follow-up with Dr. Estanislado Pandy from  rheumatology for ongoing treatments . -Would recommend she discuss with Dr. Estanislado Pandy regarding possible holding her [place] peri-operatively and consider possible need for peri-operative stress dose steroids.  #3 likely osteoporosis as per patient we do not have access to her DEXA scan results at this time . Her prednisone use is certainly a risk factor for this as is her age. She has had a left subcapital femur fracture already. -patient to continue  calcium and vitamin D and is currently on fosamax. -we will need to repeat a Bone density scan prior to considering Aromatase inhibitor -we might need to switch her to Reclast or Prolia if there is still significant bone loss.   referral to Dr Ninfa Linden for breast  surgery planning/scheduling -referral to Radiation Oncology to consideration post lumpectomy adjuvant radiation therapy -RTC with Dr Irene Limbo in 32month with labs to consider adjuvant endocrine treatment options  I spent 45 minutes counseling the patient face to face. The total time spent in the appointment was 60 minutes and more than 50% was on counseling and direct patient cares.    GSullivan LoneMD MLa CarlaAAHIVMS SPeace Harbor HospitalCSaint Mary'S Health CareHematology/Oncology Physician CMet[facility] (Office):       [phone](Work cell):  [phone](Fax):           [phone]

## 2016-06-23 NOTE — Progress Notes (Signed)
Spoke with Sunday Spillers, RN at [facility] with Dr. Ninfa Linden.  Per Dr. Irene Limbo, ok to move forward with scheduling surgery for lumpectomy.  Message relayed to [place].  Per Sunday Spillers, message would be given to Dr. Ninfa Linden to place orders for surgery, [facility] will contact pt with further instructions.

## 2016-06-24 ENCOUNTER — Ambulatory Visit: Payer: Medicare Other | Admitting: Hematology

## 2016-06-25 ENCOUNTER — Telehealth: Payer: Self-pay | Admitting: Rheumatology

## 2016-06-25 NOTE — Telephone Encounter (Signed)
Patient would like lab orders sent to her PCP Dr. Jacelyn Grip.   She would also like to know when she is due for her eye exam.

## 2016-06-25 NOTE — Telephone Encounter (Signed)
Patient advised lab order has been faxed to Dr. Jacelyn Grip. Patient states she is on [place] not PLQ and advised she does not need a PLQ eye exam being that she is no longer on [place]. Patient verbalized understanding.

## 2016-06-29 ENCOUNTER — Other Ambulatory Visit: Payer: Self-pay | Admitting: Surgery

## 2016-06-29 DIAGNOSIS — C50912 Malignant neoplasm of unspecified site of left female breast: Secondary | ICD-10-CM

## 2016-06-29 DIAGNOSIS — Z170 Estrogen receptor positive status [ER+]: Principal | ICD-10-CM

## 2016-07-05 ENCOUNTER — Other Ambulatory Visit: Payer: Self-pay | Admitting: *Deleted

## 2016-07-05 ENCOUNTER — Encounter (HOSPITAL_COMMUNITY): Payer: Self-pay

## 2016-07-05 ENCOUNTER — Telehealth: Payer: Self-pay | Admitting: Hematology

## 2016-07-05 ENCOUNTER — Encounter (HOSPITAL_COMMUNITY)
Admission: RE | Admit: 2016-07-05 | Discharge: 2016-07-05 | Disposition: A | Discharge status: Home / Self Care | Payer: Medicare Other | Source: Ambulatory Visit | Attending: Surgery | Admitting: Surgery

## 2016-07-05 ENCOUNTER — Telehealth: Payer: Self-pay | Admitting: *Deleted

## 2016-07-05 DIAGNOSIS — Z01818 Encounter for other preprocedural examination: Secondary | ICD-10-CM | POA: Insufficient documentation

## 2016-07-05 DIAGNOSIS — Z79899 Other long term (current) drug therapy: Secondary | ICD-10-CM

## 2016-07-05 HISTORY — DX: Malignant (primary) neoplasm, unspecified: C80.1

## 2016-07-05 LAB — CBC WITH DIFFERENTIAL/PLATELET
BASOS ABS: 80 {cells}/uL (ref 0–200)
Basophils Relative: 1 %
EOS ABS: 160 {cells}/uL (ref 15–500)
Eosinophils Relative: 2 %
HCT: 33.8 % — ABNORMAL LOW (ref 35.0–45.0)
HEMOGLOBIN: 10.9 g/dL — AB (ref 11.7–15.5)
LYMPHS ABS: 2240 {cells}/uL (ref 850–3900)
Lymphocytes Relative: 28 %
MCH: 27.4 pg (ref 27.0–33.0)
MCHC: 32.2 g/dL (ref 32.0–36.0)
MCV: 84.9 fL (ref 80.0–100.0)
MONOS PCT: 8 %
MPV: 9.8 fL (ref 7.5–12.5)
Monocytes Absolute: 640 cells/uL (ref 200–950)
NEUTROS ABS: 4880 {cells}/uL (ref 1500–7800)
Neutrophils Relative %: 61 %
Platelets: 331 10*3/uL (ref 140–400)
RBC: 3.98 MIL/uL (ref 3.80–5.10)
RDW: 15.4 % — ABNORMAL HIGH (ref 11.0–15.0)
WBC: 8 10*3/uL (ref 3.8–10.8)

## 2016-07-05 LAB — COMPLETE METABOLIC PANEL WITH GFR
ALK PHOS: 126 U/L (ref 33–130)
ALT: 42 U/L — ABNORMAL HIGH (ref 6–29)
AST: 39 U/L — ABNORMAL HIGH (ref 10–35)
Albumin: 4.1 g/dL (ref 3.6–5.1)
BILIRUBIN TOTAL: 0.4 mg/dL (ref 0.2–1.2)
BUN: 12 mg/dL (ref 7–25)
CO2: 31 mmol/L (ref 20–31)
Calcium: 9.7 mg/dL (ref 8.6–10.4)
Chloride: 97 mmol/L — ABNORMAL LOW (ref 98–110)
Creat: 0.7 mg/dL (ref 0.60–0.93)
GFR, EST NON AFRICAN AMERICAN: 86 mL/min (ref >=60)
Glucose, Bld: 157 mg/dL — ABNORMAL HIGH (ref 65–99)
Potassium: 4.4 mmol/L (ref 3.5–5.3)
Sodium: 136 mmol/L (ref 135–146)
TOTAL PROTEIN: 7.1 g/dL (ref 6.1–8.1)

## 2016-07-05 NOTE — Pre-Procedure Instructions (Signed)
    MANINDER ESTEVE  07/05/2016      Your procedure is scheduled on Friday, February 9th   Report to [facility] Admitting at 5:30 AM.             (posted surgery time 7:30 - 8:34 AM)   Call this number if you have problems the MORNING of surgery:  [phone].  Short Stay Center is NOT open on weekends.   Remember:  Do not eat food or drink liquids after midnight Thursday.   Take these medicines the morning of surgery with A SIP OF WATER : NONE             Please use your inhaler that morning if needed.              STOP taking Aspirin, Aspirin Products (Goody Powder, Excedrin Migraine), Ibuprofen (Advil), Naproxen (Aleve), Vitamins and Herbal Products (ie Fish Oil), Turmeric).  Do not wear jewelry, make-up or nail polish.  Do not wear lotions, powders,  perfumes, or deoderant.  Do not shave underarms & legs 48 hours prior to surgery.     Do not bring valuables to the hospital.  [facility] is not responsible for any belongings or valuables.  Contacts, dentures or bridgework may not be worn into surgery.  Leave your suitcase in the car.  After surgery it may be brought to your room.  For patients admitted to the hospital, discharge time will be determined by your treatment team.  Patients discharged the day of surgery will not be allowed to drive home, and you will need someone to stay with you for the first 24 hrs after.   Please read over the following fact sheets that you were given. Pain Booklet and Surgical Site Infection Prevention

## 2016-07-05 NOTE — Pre-Procedure Instructions (Signed)
    Felicia Gonzalez  07/05/2016      [id number] - [place]  - [address] [place] [address] Phone: [phone] Fax: [phone]    Your procedure is scheduled on Friday, February 9th   Report to [facility] Admitting at 5:30 AM.             (posted surgery time 7:30 - 8:34 AM)   Call this number if you have problems the MORNING of surgery:  [phone].  [facility] Stay Center is NOT open on weekends.   Remember:  Do not eat food or drink liquids after midnight Thursday.   Take these medicines the morning of surgery with A SIP OF WATER : NONE             Please use your inhaler that morning if needed.   Do not wear jewelry, make-up or nail polish.  Do not wear lotions, powders,  perfumes, or deoderant.  Do not shave underarms & legs 48 hours prior to surgery.     Do not bring valuables to the hospital.  [facility] is not responsible for any belongings or valuables.  Contacts, dentures or bridgework may not be worn into surgery.  Leave your suitcase in the car.  After surgery it may be brought to your room.  For patients admitted to the hospital, discharge time will be determined by your treatment team.  Patients discharged the day of surgery will not be allowed to drive home, and you will need someone to stay with you for the first 24 hrs after.   Please read over the following fact sheets that you were given. Pain Booklet and Surgical Site Infection Prevention

## 2016-07-05 NOTE — Telephone Encounter (Signed)
Per patient's request, left message advising patient on recommendations. Patient advised to hold her [place] and to restart 2 weeks after surgery after cleared by her surgery. Patient advised to continue her Prednisone as it should not be stopped abruptly and she should consult with her surgeon and anesthesiologist regarding this matter as well. Patient advised to call the office if she has any questions.

## 2016-07-05 NOTE — Telephone Encounter (Signed)
Called and spoke with patient and confirmed appointments for 3-26

## 2016-07-05 NOTE — Telephone Encounter (Signed)
Patient is having breast surgery due to her breast cancer on 07/09/16. Patient is on Arava and Prednisone and would like to know if she should stop her medications and if so when. Please advise.

## 2016-07-05 NOTE — Telephone Encounter (Signed)
Patient can hold off on the [place] currently. She can restart 2 weeks after cleared from surgery.  As we verbally discussed, pt is already off of biologic  However she's been on prednisone long-term and trying to taper down. From the last record on January 2018, Dr. Estanislado Pandy noted that she is on 3 mg daily. She needs to speak with her anesthesiologist and her surgeon but she should stay on the prednisone at this time and not discontinue it abruptly if that is okay with them. Also, there is not enough time to taper her down since her surgery is coming up in 4 days (07/09/2016).

## 2016-07-06 ENCOUNTER — Telehealth: Payer: Self-pay | Admitting: Rheumatology

## 2016-07-06 NOTE — Telephone Encounter (Signed)
I spoke with patient at length regarding her concern on pain control after surgery scheduled for Friday, 07/09/2016.Summary:#1: Had to discontinue Orencia about December 30.#2: started Arava 20 mg approximately January 13.#3: Taking prednisone 3 mg daily. Had a flare recently and patient went up to 5 mg. She discussed taking prednisone with her surgeon/their representative and she asked if she can continue prednisone and they said it was okay according to the patient.#4: Has discontinued turmeric#5: I advised the patient that after surgery, she will be in pain and they will manage that pain with their own pain control medication.If there is a need for our intervention, we will be happy to help the patient. She can call us and let us knowShould the need arise and if her surgeon is agreeable, I plan to do a prednisone taper temporarily after the surgery and get her back on 5 mg of prednisone daily for about a week and bring it down to 3 mg thereafter

## 2016-07-06 NOTE — Telephone Encounter (Signed)
Patient states she would like a call back from Felicia Gonzalez about what she should do about medications. Please call patient.

## 2016-07-07 ENCOUNTER — Ambulatory Visit
Admission: RE | Admit: 2016-07-07 | Discharge: 2016-07-07 | Disposition: A | Discharge status: Home / Self Care | Payer: Medicare Other | Source: Ambulatory Visit | Attending: Surgery | Admitting: Surgery

## 2016-07-07 DIAGNOSIS — Z170 Estrogen receptor positive status [ER+]: Principal | ICD-10-CM

## 2016-07-07 DIAGNOSIS — C50912 Malignant neoplasm of unspecified site of left female breast: Secondary | ICD-10-CM

## 2016-07-08 MED ORDER — CIPROFLOXACIN IN D5W 400 MG/200ML IV SOLN
400.0000 mg | INTRAVENOUS | Status: AC
  Administered 2016-07-09: 400 mg via INTRAVENOUS
  Filled 2016-07-08: qty 200

## 2016-07-08 NOTE — H&P (Signed)
Felicia Gonzalez 06/09/2016 3:26 PM Location: [facility] Patient #: [id number] DOB: 1940/11/29 Married / Language: English / Race: White Female   History of Present Illness (Sheri Prows A. Ninfa Linden MD; 06/09/2016 4:10 PM) The patient is a 76 year old female who presents with breast cancer. This is a pleasant patient referred by Dr. Ulanda Edison after the recent diagnosis of a left breast cancer. She has had multiple areas of calcifications and other findings in her breast that have been followed with mammograms since February of last year. In her mammograms in December, there was a new mass identified that appeared suspicious She ended up having further films as well as stereotactic biopsy of 3 areas in the left breast. She has no prior history of breast cancer. She has no drainage from her nipples. There is no family history of breast cancer.  The pathology on the left breast mass of the 8 o'clock position showed an invasive ductal carcinoma which is grade 2 with lymphovascular space involvement. She was ER and PR positive. The other 2 areas biopsied and left breast for benign. The size of the lesion was approximately 8 mm. Ultrasound of the axilla showed no abnormal lymph nodes.   Past Surgical History Malachi Bonds, CMA; 06/09/2016 3:31 PM) Breast Biopsy  Bilateral. Cataract Surgery  Bilateral. Hip Surgery  Left.  Diagnostic Studies History Malachi Bonds, CMA; 06/09/2016 3:31 PM) Colonoscopy  never  Allergies Malachy Moan, RMA; 06/09/2016 3:28 PM) Sulfa Antibiotics  Penicillins   Medication History Malachy Moan, RMA; 06/09/2016 3:32 PM) Azithromycin (250MG  Tablet, Oral daily) Active. Benzonatate (200MG  Capsule, Oral daily) Active. Leflunomide (20MG  Tablet, Oral daily) Active. PredniSONE (1MG  Tablet, 3mg  Oral daily) Active. Tylenol 8 Hour (650MG  Tablet ER, Oral daily) Active. Fosamax (70MG  Tablet, Oral daily) Active. Vitamin D (2000UNIT Tablet, Oral  daily) Active. Multivitamin/Multimineral (Oral daily) Active. Lovaza (1GM Capsule, Oral daily) Active. Saccharomyces boulardii (250MG  Capsule, Oral daily) Active. Travatan (0.004% Solution, Ophthalmic daily) Active. Medications Reconciled  Social History Malachi Bonds, CMA; [id number] PM) Illicit drug use  Remotely quit drug use. No alcohol use  Tobacco use  Never smoker.  Family History Malachi Bonds, CMA; 06/09/2016 3:31 PM) Depression  Daughter. Hypertension  Father. Prostate Cancer  Father.  Pregnancy / Birth History Malachi Bonds, CMA; 06/09/2016 3:31 PM) Age of menopause  12-55 Maternal age  31-30 Para  2 Regular periods   Other Problems Malachi Bonds, CMA; 06/09/2016 3:31 PM) Asthma     Review of Systems Malachi Bonds CMA; 06/09/2016 3:31 PM) General Not Present- Appetite Loss, Chills, Fatigue, Fever, Night Sweats, Weight Gain and Weight Loss. Skin Not Present- Change in Wart/Mole, Dryness, Hives, Jaundice, New Lesions, Non-Healing Wounds, Rash and Ulcer. HEENT Present- Seasonal Allergies. Not Present- Earache, Hearing Loss, Hoarseness, Nose Bleed, Oral Ulcers, Ringing in the Ears, Sinus Pain, Sore Throat, Visual Disturbances, Wears glasses/contact lenses and Yellow Eyes. Respiratory Present- Wheezing. Not Present- Bloody sputum, Chronic Cough, Difficulty Breathing and Snoring. Breast Not Present- Breast Mass, Breast Pain, Nipple Discharge and Skin Changes. Cardiovascular Not Present- Chest Pain, Difficulty Breathing Lying Down, Leg Cramps, Palpitations, Rapid Heart Rate, Shortness of Breath and Swelling of Extremities. Gastrointestinal Not Present- Abdominal Pain, Bloating, Bloody Stool, Change in Bowel Habits, Chronic diarrhea, Constipation, Difficulty Swallowing, Excessive gas, Gets full quickly at meals, Hemorrhoids, Indigestion, Nausea, Rectal Pain and Vomiting. Female Genitourinary Not Present- Frequency, Nocturia, Painful Urination, Pelvic Pain  and Urgency. Musculoskeletal Present- Joint Pain. Not Present- Back Pain, Joint Stiffness, Muscle Pain, Muscle Weakness and Swelling  of Extremities. Endocrine Not Present- Cold Intolerance, Excessive Hunger, Hair Changes, Heat Intolerance, Hot flashes and New Diabetes. Hematology Not Present- Blood Thinners, Easy Bruising, Excessive bleeding, Gland problems, HIV and Persistent Infections.  Vitals Malachy Moan RMA; 06/09/2016 3:32 PM) 06/09/2016 3:32 PM Weight: 139.2 lb Height: 61in Body Surface Area: 1.62 m Body Mass Index: 26.3 kg/m  Temp.: 98.16F  Pulse: 111 (Regular)  BP: 150/80 (Sitting, Left Arm, Standard)       Physical Exam (Yahye Siebert A. Ninfa Linden MD; 06/09/2016 4:10 PM) General Mental Status-Alert. General Appearance-Consistent with stated age. Hydration-Well hydrated. Voice-Normal.  Head and Neck Head-normocephalic, atraumatic with no lesions or palpable masses. Trachea-midline. Thyroid Gland Characteristics - normal size and consistency.  Eye Eyeball - Bilateral-Extraocular movements intact. Sclera/Conjunctiva - Bilateral-No scleral icterus.  Chest and Lung Exam Chest and lung exam reveals -quiet, even and easy respiratory effort with no use of accessory muscles and on auscultation, normal breath sounds, no adventitious sounds and normal vocal resonance. Inspection Chest Wall - Normal. Back - normal.  Breast Breast - Left-Symmetric, Non Tender, No Biopsy scars, no Dimpling, No Inflammation, No Lumpectomy scars, No Mastectomy scars, No Peau d' Orange. Note: I cannot palpate a mass in the left breast and there is no axillary adenopathy Breast - Right-Symmetric, Non Tender, No Biopsy scars, no Dimpling, No Inflammation, No Lumpectomy scars, No Mastectomy scars, No Peau d' Orange. Breast Lump-No Palpable Breast Mass.  Cardiovascular Cardiovascular examination reveals -normal heart sounds, regular rate and rhythm with no  murmurs and normal pedal pulses bilaterally.  Abdomen Inspection Inspection of the abdomen reveals - No Hernias. Skin - Scar - no surgical scars. Palpation/Percussion Palpation and Percussion of the abdomen reveal - Soft, Non Tender, No Rebound tenderness, No Rigidity (guarding) and No hepatosplenomegaly. Auscultation Auscultation of the abdomen reveals - Bowel sounds normal.  Neurologic - Did not examine.  Musculoskeletal - Did not examine.  Lymphatic Head & Neck  General Head & Neck Lymphatics: Bilateral - Description - Normal. Axillary  General Axillary Region: Bilateral - Description - Normal. Tenderness - Non Tender. Femoral & Inguinal - Did not examine.    Assessment & Plan (Herchel Hopkin A. Ninfa Linden MD; 06/09/2016 4:12 PM) BREAST CANCER, STAGE 1, LEFT (C50.912) Impression: I discussed the diagnosis in detail with her and gave her a copy of her pathology report. We went over the report in detail. I discussed breast cancer with her. I discussed surgery for breast cancer. We discussed breast conservation versus mastectomy. We also discussed lymph node biopsy. After long discussion, she wishes to proceed with a left breast radioactive seed localized lumpectomy with left axillary sentinel lymph node biopsy. I discussed the surgical procedure in detail. I discussed the risk of surgery which includes but is not limited to bleeding, infection, need for further surgery if the margins are lymph nodes are positive for malignancy, cardiopulmonary issues, postoperative recovery, etc. We will also refer her to medical and radiation oncology in the perioperative period. Surgery will be scheduled

## 2016-07-08 NOTE — Anesthesia Preprocedure Evaluation (Addendum)
Anesthesia Evaluation  Patient identified by MRN, date of birth, ID band Patient awake    Reviewed: Allergy & Precautions, NPO status , Patient's Chart, lab work & pertinent test results  History of Anesthesia Complications Negative for: history of anesthetic complications  Airway Mallampati: II  TM Distance: >3 FB Neck ROM: Full    Dental no notable dental hx. (+) Dental Advisory Given   Pulmonary asthma (no inhaler use in over a month) ,    Pulmonary exam normal        Cardiovascular negative cardio ROS Normal cardiovascular exam     Neuro/Psych negative neurological ROS  negative psych ROS   GI/Hepatic negative GI ROS, Neg liver ROS,   Endo/Other  negative endocrine ROS  Renal/GU negative Renal ROS     Musculoskeletal  (+) Arthritis , Rheumatoid disorders and steroids,    Abdominal   Peds  Hematology negative hematology ROS (+)   Anesthesia Other Findings   Reproductive/Obstetrics                            Anesthesia Physical  Anesthesia Plan  ASA: III  Anesthesia Plan: General   Post-op Pain Management:  Regional for Post-op pain   Induction: Intravenous  Airway Management Planned: Oral ETT and LMA  Additional Equipment:   Intra-op Plan:   Post-operative Plan: Extubation in OR  Informed Consent: I have reviewed the patients History and Physical, chart, labs and discussed the procedure including the risks, benefits and alternatives for the proposed anesthesia with the patient or authorized representative who has indicated his/her understanding and acceptance.   Dental advisory given  Plan Discussed with: CRNA and Anesthesiologist  Anesthesia Plan Comments:        Anesthesia Quick Evaluation

## 2016-07-09 ENCOUNTER — Ambulatory Visit (HOSPITAL_COMMUNITY): Payer: Medicare Other | Admitting: Anesthesiology

## 2016-07-09 ENCOUNTER — Encounter (HOSPITAL_COMMUNITY)
Admission: RE | Admit: 2016-07-09 | Discharge: 2016-07-09 | Disposition: A | Discharge status: Home / Self Care | Payer: Medicare Other | Source: Ambulatory Visit | Attending: Surgery | Admitting: Surgery

## 2016-07-09 ENCOUNTER — Encounter (HOSPITAL_COMMUNITY): Payer: Self-pay | Admitting: *Deleted

## 2016-07-09 ENCOUNTER — Ambulatory Visit (HOSPITAL_COMMUNITY)
Admission: RE | Admit: 2016-07-09 | Discharge: 2016-07-09 | Disposition: A | Discharge status: Home / Self Care | Payer: Medicare Other | Source: Ambulatory Visit | Attending: Surgery | Admitting: Surgery

## 2016-07-09 ENCOUNTER — Encounter (HOSPITAL_COMMUNITY)
Admission: RE | Disposition: A | Discharge status: Home / Self Care | Payer: Self-pay | Source: Ambulatory Visit | Attending: Surgery

## 2016-07-09 ENCOUNTER — Ambulatory Visit
Admission: RE | Admit: 2016-07-09 | Discharge: 2016-07-09 | Disposition: A | Discharge status: Home / Self Care | Payer: Medicare Other | Source: Ambulatory Visit | Attending: Surgery | Admitting: Surgery

## 2016-07-09 DIAGNOSIS — C50912 Malignant neoplasm of unspecified site of left female breast: Secondary | ICD-10-CM

## 2016-07-09 DIAGNOSIS — Z882 Allergy status to sulfonamides status: Secondary | ICD-10-CM | POA: Insufficient documentation

## 2016-07-09 DIAGNOSIS — J45909 Unspecified asthma, uncomplicated: Secondary | ICD-10-CM | POA: Insufficient documentation

## 2016-07-09 DIAGNOSIS — Z7951 Long term (current) use of inhaled steroids: Secondary | ICD-10-CM | POA: Diagnosis not present

## 2016-07-09 DIAGNOSIS — Z170 Estrogen receptor positive status [ER+]: Secondary | ICD-10-CM | POA: Diagnosis not present

## 2016-07-09 DIAGNOSIS — M069 Rheumatoid arthritis, unspecified: Secondary | ICD-10-CM | POA: Diagnosis not present

## 2016-07-09 DIAGNOSIS — C50312 Malignant neoplasm of lower-inner quadrant of left female breast: Secondary | ICD-10-CM | POA: Diagnosis not present

## 2016-07-09 DIAGNOSIS — Z880 Allergy status to penicillin: Secondary | ICD-10-CM | POA: Diagnosis not present

## 2016-07-09 HISTORY — PX: BREAST LUMPECTOMY WITH RADIOACTIVE SEED AND SENTINEL LYMPH NODE BIOPSY: SHX6550

## 2016-07-09 HISTORY — PX: BREAST LUMPECTOMY: SHX2

## 2016-07-09 SURGERY — BREAST LUMPECTOMY WITH RADIOACTIVE SEED AND SENTINEL LYMPH NODE BIOPSY
Anesthesia: General | Site: Breast | Laterality: Left

## 2016-07-09 MED ORDER — TECHNETIUM TC 99M SULFUR COLLOID FILTERED
1.0000 | Freq: Once | INTRAVENOUS | Status: AC | PRN
  Administered 2016-07-09: 1 via INTRADERMAL

## 2016-07-09 MED ORDER — DEXAMETHASONE SODIUM PHOSPHATE 10 MG/ML IJ SOLN
INTRAMUSCULAR | Status: AC
  Filled 2016-07-09: qty 1

## 2016-07-09 MED ORDER — DIPHENHYDRAMINE HCL 50 MG/ML IJ SOLN
INTRAMUSCULAR | Status: AC
  Filled 2016-07-09: qty 1

## 2016-07-09 MED ORDER — MORPHINE SULFATE (PF) 2 MG/ML IV SOLN: 1.0000 mg | INTRAVENOUS | Status: DC | PRN

## 2016-07-09 MED ORDER — CHLORHEXIDINE GLUCONATE CLOTH 2 % EX PADS: 6.0000 | MEDICATED_PAD | Freq: Once | CUTANEOUS | Status: DC

## 2016-07-09 MED ORDER — HYDROMORPHONE HCL 1 MG/ML IJ SOLN: 0.2500 mg | INTRAMUSCULAR | Status: DC | PRN

## 2016-07-09 MED ORDER — PROPOFOL 10 MG/ML IV BOLUS
INTRAVENOUS | Status: AC
  Filled 2016-07-09: qty 40

## 2016-07-09 MED ORDER — PHENYLEPHRINE HCL 10 MG/ML IJ SOLN
INTRAMUSCULAR | Status: DC | PRN
  Administered 2016-07-09: 80 ug via INTRAVENOUS
  Administered 2016-07-09: 120 ug via INTRAVENOUS
  Administered 2016-07-09: 80 ug via INTRAVENOUS

## 2016-07-09 MED ORDER — DIPHENHYDRAMINE HCL 50 MG/ML IJ SOLN
INTRAMUSCULAR | Status: DC | PRN
  Administered 2016-07-09: 12.5 mg via INTRAVENOUS

## 2016-07-09 MED ORDER — LIDOCAINE 2% (20 MG/ML) 5 ML SYRINGE
INTRAMUSCULAR | Status: DC | PRN
  Administered 2016-07-09: 100 mg via INTRAVENOUS

## 2016-07-09 MED ORDER — SCOPOLAMINE 1 MG/3DAYS TD PT72
1.0000 | MEDICATED_PATCH | TRANSDERMAL | Status: DC
  Administered 2016-07-09: 1 via TRANSDERMAL
  Administered 2016-07-09: 1.5 mg via TRANSDERMAL

## 2016-07-09 MED ORDER — LACTATED RINGERS IV SOLN
INTRAVENOUS | Status: DC | PRN
  Administered 2016-07-09: 07:00:00 via INTRAVENOUS

## 2016-07-09 MED ORDER — FENTANYL CITRATE (PF) 100 MCG/2ML IJ SOLN
INTRAMUSCULAR | Status: AC
  Filled 2016-07-09: qty 2

## 2016-07-09 MED ORDER — ACETAMINOPHEN 10 MG/ML IV SOLN
INTRAVENOUS | Status: AC
  Filled 2016-07-09: qty 100

## 2016-07-09 MED ORDER — SCOPOLAMINE 1 MG/3DAYS TD PT72
MEDICATED_PATCH | TRANSDERMAL | Status: AC
  Filled 2016-07-09: qty 1

## 2016-07-09 MED ORDER — OXYCODONE HCL 5 MG PO TABS: 5.0000 mg | ORAL_TABLET | ORAL | Status: DC | PRN

## 2016-07-09 MED ORDER — ACETAMINOPHEN 325 MG PO TABS: 650.0000 mg | ORAL_TABLET | ORAL | Status: DC | PRN

## 2016-07-09 MED ORDER — SODIUM CHLORIDE 0.9 % IJ SOLN
INTRAMUSCULAR | Status: AC
  Filled 2016-07-09: qty 10

## 2016-07-09 MED ORDER — BUPIVACAINE HCL (PF) 0.25 % IJ SOLN
INTRAMUSCULAR | Status: DC | PRN
  Administered 2016-07-09: 21 mL

## 2016-07-09 MED ORDER — ACETAMINOPHEN 650 MG RE SUPP: 650.0000 mg | RECTAL | Status: DC | PRN

## 2016-07-09 MED ORDER — DEXAMETHASONE SODIUM PHOSPHATE 10 MG/ML IJ SOLN
INTRAMUSCULAR | Status: DC | PRN
  Administered 2016-07-09: 10 mg via INTRAVENOUS

## 2016-07-09 MED ORDER — EPHEDRINE 5 MG/ML INJ
INTRAVENOUS | Status: AC
  Filled 2016-07-09: qty 10

## 2016-07-09 MED ORDER — LIDOCAINE 2% (20 MG/ML) 5 ML SYRINGE
INTRAMUSCULAR | Status: AC
  Filled 2016-07-09: qty 5

## 2016-07-09 MED ORDER — FENTANYL CITRATE (PF) 100 MCG/2ML IJ SOLN
INTRAMUSCULAR | Status: DC | PRN
  Administered 2016-07-09: 25 ug via INTRAVENOUS
  Administered 2016-07-09: 50 ug via INTRAVENOUS
  Administered 2016-07-09 (×3): 25 ug via INTRAVENOUS

## 2016-07-09 MED ORDER — ONDANSETRON HCL 4 MG/2ML IJ SOLN
INTRAMUSCULAR | Status: DC | PRN
  Administered 2016-07-09: 4 mg via INTRAVENOUS

## 2016-07-09 MED ORDER — METHYLENE BLUE 0.5 % INJ SOLN
INTRAVENOUS | Status: AC
  Filled 2016-07-09: qty 10

## 2016-07-09 MED ORDER — PROMETHAZINE HCL 25 MG/ML IJ SOLN: 6.2500 mg | INTRAMUSCULAR | Status: DC | PRN

## 2016-07-09 MED ORDER — PHENYLEPHRINE 40 MCG/ML (10ML) SYRINGE FOR IV PUSH (FOR BLOOD PRESSURE SUPPORT)
PREFILLED_SYRINGE | INTRAVENOUS | Status: DC | PRN
  Administered 2016-07-09: 80 ug via INTRAVENOUS

## 2016-07-09 MED ORDER — BUPIVACAINE HCL (PF) 0.25 % IJ SOLN
INTRAMUSCULAR | Status: AC
  Filled 2016-07-09: qty 30

## 2016-07-09 MED ORDER — PHENYLEPHRINE 40 MCG/ML (10ML) SYRINGE FOR IV PUSH (FOR BLOOD PRESSURE SUPPORT)
PREFILLED_SYRINGE | INTRAVENOUS | Status: AC
  Filled 2016-07-09: qty 10

## 2016-07-09 MED ORDER — 0.9 % SODIUM CHLORIDE (POUR BTL) OPTIME
TOPICAL | Status: DC | PRN
  Administered 2016-07-09: 1000 mL

## 2016-07-09 MED ORDER — TRAMADOL HCL 50 MG PO TABS: 50.0000 mg | ORAL_TABLET | Freq: Four times a day (QID) | ORAL | 0 refills | Status: DC | PRN

## 2016-07-09 MED ORDER — ONDANSETRON HCL 4 MG/2ML IJ SOLN
INTRAMUSCULAR | Status: AC
  Filled 2016-07-09: qty 2

## 2016-07-09 MED ORDER — PROPOFOL 10 MG/ML IV BOLUS
INTRAVENOUS | Status: DC | PRN
  Administered 2016-07-09: 130 mg via INTRAVENOUS

## 2016-07-09 MED ORDER — ACETAMINOPHEN 10 MG/ML IV SOLN
INTRAVENOUS | Status: DC | PRN
  Administered 2016-07-09: 1000 mg via INTRAVENOUS

## 2016-07-09 SURGICAL SUPPLY — 43 items
APPLIER CLIP 9.375 MED OPEN (MISCELLANEOUS) ×2
BINDER BREAST LRG (GAUZE/BANDAGES/DRESSINGS) ×2 IMPLANT
BINDER BREAST XLRG (GAUZE/BANDAGES/DRESSINGS) IMPLANT
BLADE PHOTON ILLUMINATED (MISCELLANEOUS) ×2 IMPLANT
BLADE SURG 15 STRL LF DISP TIS (BLADE) ×1 IMPLANT
BLADE SURG 15 STRL SS (BLADE) ×1
CANISTER SUCTION 2500CC (MISCELLANEOUS) ×2 IMPLANT
CHLORAPREP W/TINT 26ML (MISCELLANEOUS) ×2 IMPLANT
CLIP APPLIE 9.375 MED OPEN (MISCELLANEOUS) ×1 IMPLANT
COVER PROBE W GEL 5X96 (DRAPES) ×2 IMPLANT
COVER SURGICAL LIGHT HANDLE (MISCELLANEOUS) ×2 IMPLANT
DERMABOND ADVANCED (GAUZE/BANDAGES/DRESSINGS) ×1
DERMABOND ADVANCED .7 DNX12 (GAUZE/BANDAGES/DRESSINGS) ×1 IMPLANT
DRAPE CHEST BREAST 15X10 FENES (DRAPES) ×2 IMPLANT
DRAPE UTILITY XL STRL (DRAPES) ×2 IMPLANT
ELECT CAUTERY BLADE 6.4 (BLADE) ×2 IMPLANT
ELECT REM PT RETURN 9FT ADLT (ELECTROSURGICAL) ×2
ELECTRODE REM PT RTRN 9FT ADLT (ELECTROSURGICAL) ×1 IMPLANT
GLOVE SURG SIGNA 7.5 PF LTX (GLOVE) ×2 IMPLANT
GOWN STRL REUS W/ TWL LRG LVL3 (GOWN DISPOSABLE) ×1 IMPLANT
GOWN STRL REUS W/ TWL XL LVL3 (GOWN DISPOSABLE) ×1 IMPLANT
GOWN STRL REUS W/TWL LRG LVL3 (GOWN DISPOSABLE) ×1
GOWN STRL REUS W/TWL XL LVL3 (GOWN DISPOSABLE) ×1
KIT BASIN OR (CUSTOM PROCEDURE TRAY) ×2 IMPLANT
KIT MARKER MARGIN INK (KITS) ×2 IMPLANT
LIQUID BAND (GAUZE/BANDAGES/DRESSINGS) ×2 IMPLANT
NDL SAFETY ECLIPSE 18X1.5 (NEEDLE) IMPLANT
NEEDLE FILTER BLUNT 18X 1/2SAF (NEEDLE)
NEEDLE FILTER BLUNT 18X1 1/2 (NEEDLE) IMPLANT
NEEDLE HYPO 18GX1.5 SHARP (NEEDLE)
NEEDLE HYPO 25X1 1.5 SAFETY (NEEDLE) ×2 IMPLANT
NS IRRIG 1000ML POUR BTL (IV SOLUTION) ×2 IMPLANT
PACK SURGICAL SETUP 50X90 (CUSTOM PROCEDURE TRAY) ×2 IMPLANT
PENCIL BUTTON HOLSTER BLD 10FT (ELECTRODE) ×2 IMPLANT
SPONGE LAP 18X18 X RAY DECT (DISPOSABLE) ×2 IMPLANT
SUT MNCRL AB 4-0 PS2 18 (SUTURE) ×2 IMPLANT
SUT VIC AB 3-0 SH 18 (SUTURE) ×4 IMPLANT
SYR BULB 3OZ (MISCELLANEOUS) ×2 IMPLANT
SYR CONTROL 10ML LL (SYRINGE) ×2 IMPLANT
TOWEL OR 17X24 6PK STRL BLUE (TOWEL DISPOSABLE) ×2 IMPLANT
TOWEL OR 17X26 10 PK STRL BLUE (TOWEL DISPOSABLE) ×2 IMPLANT
TUBE CONNECTING 12X1/4 (SUCTIONS) ×2 IMPLANT
YANKAUER SUCT BULB TIP NO VENT (SUCTIONS) ×2 IMPLANT

## 2016-07-09 NOTE — Anesthesia Procedure Notes (Signed)
Anesthesia Regional Block:  Pectoralis block  Pre-Anesthetic Checklist: ,, timeout performed, Correct Patient, Correct Site, Correct Laterality, Correct Procedure, Correct Position, site marked, Risks and benefits discussed,  Surgical consent,  Pre-op evaluation,  At surgeon's request and post-op pain management  Laterality: Left  Prep: chloraprep       Needles:  Injection technique: Single-shot  Needle Type: Echogenic Stimulator Needle     Needle Length: 10cm 10 cm Needle Gauge: 21 and 21 G    Additional Needles:  Procedures: ultrasound guided (picture in chart) Pectoralis block Narrative:  Start time: 07/09/2016 7:11 AM End time: 07/09/2016 7:21 AM Injection made incrementally with aspirations every 5 mL.  Performed by: Personally

## 2016-07-09 NOTE — Anesthesia Procedure Notes (Addendum)
Procedure Name: LMA Insertion Date/Time: 07/09/2016 7:51 AM Performed by: Mervyn Gay Pre-anesthesia Checklist: Patient identified, Patient being monitored, Timeout performed, Emergency Drugs available and Suction available Patient Re-evaluated:Patient Re-evaluated prior to inductionOxygen Delivery Method: Circle System Utilized Preoxygenation: Pre-oxygenation with 100% oxygen Intubation Type: IV induction Ventilation: Mask ventilation without difficulty LMA: LMA inserted LMA Size: 4.0 Number of attempts: 1 Placement Confirmation: positive ETCO2 and breath sounds checked- equal and bilateral Tube secured with: Tape Dental Injury: Teeth and Oropharynx as per pre-operative assessment

## 2016-07-09 NOTE — Interval H&P Note (Signed)
History and Physical Interval Note: no change in H and P  07/09/2016 6:38 AM  Felicia Gonzalez  has presented today for surgery, with the diagnosis of LEFT BREAST CANCER  The various methods of treatment have been discussed with the patient and family. After consideration of risks, benefits and other options for treatment, the patient has consented to  Procedure(s): BREAST LUMPECTOMY WITH RADIOACTIVE SEED AND SENTINEL LYMPH NODE BIOPSY (Left) as a surgical intervention .  The patient's history has been reviewed, patient examined, no change in status, stable for surgery.  I have reviewed the patient's chart and labs.  Questions were answered to the patient's satisfaction.     [name] A

## 2016-07-09 NOTE — Transfer of Care (Signed)
Immediate Anesthesia Transfer of Care Note  Patient: Felicia Gonzalez  Procedure(s) Performed: Procedure(s): BREAST LUMPECTOMY WITH RADIOACTIVE SEED AND SENTINEL LYMPH NODE BIOPSY (Left)  Patient Location: PACU   Anesthesia Type:General   Level of Consciousness: awake, alert  and oriented  Airway & Oxygen Therapy: Patient Spontanous Breathing and Patient connected to nasal cannula oxygen  Post-op Assessment: Report given to RN, Post -op Vital signs reviewed and stable and Patient moving all extremities X 4  Post vital signs: Reviewed and stable  Last Vitals:  Vitals:   07/09/16 0737 07/09/16 0738  BP:    Pulse: 98 97  Resp: 15 14  Temp:      Last Pain:  Vitals:   07/09/16 0607  TempSrc: Oral      Patients Stated Pain Goal: 3 ([id number])  Complications: No apparent anesthesia complications

## 2016-07-09 NOTE — Discharge Instructions (Signed)
[facility] Phone Number [phone]  BREAST BIOPSY/ PARTIAL MASTECTOMY: POST OP INSTRUCTIONS  Always review your discharge instruction sheet given to you by the facility where your surgery was performed.  IF YOU HAVE DISABILITY OR FAMILY LEAVE FORMS, YOU MUST BRING THEM TO THE OFFICE FOR PROCESSING.  DO NOT GIVE THEM TO YOUR DOCTOR.  1. A prescription for pain medication may be given to you upon discharge.  Take your pain medication as prescribed, if needed.  If narcotic pain medicine is not needed, then you may take acetaminophen (Tylenol) or ibuprofen (Advil) as needed. 2. Take your usually prescribed medications unless otherwise directed 3. If you need a refill on your pain medication, please contact your pharmacy.  They will contact our office to request authorization.  Prescriptions will not be filled after 5pm or on week-ends. 4. You should eat very light the first 24 hours after surgery, such as soup, crackers, pudding, etc.  Resume your normal diet the day after surgery. 5. Most patients will experience some swelling and bruising in the breast.  Ice packs and a good support bra will help.  Swelling and bruising can take several days to resolve.  6. It is common to experience some constipation if taking pain medication after surgery.  Increasing fluid intake and taking a stool softener will usually help or prevent this problem from occurring.  A mild laxative (Milk of Magnesia or Miralax) should be taken according to package directions if there are no bowel movements after 48 hours. 7. Unless discharge instructions indicate otherwise, you may remove your bandages 24-48 hours after surgery, and you may shower at that time.  You may have steri-strips (small skin tapes) in place directly over the incision.  These strips should be left on the skin for 7-10 days.  If your surgeon used skin glue on the incision, you may shower in 24 hours.  The glue will flake off over the  next 2-3 weeks.  Any sutures or staples will be removed at the office during your follow-up visit. 8. ACTIVITIES:  You may resume regular daily activities (gradually increasing) beginning the next day.  Wearing a good support bra or sports bra minimizes pain and swelling.  You may have sexual intercourse when it is comfortable. a. You may drive when you no longer are taking prescription pain medication, you can comfortably wear a seatbelt, and you can safely maneuver your car and apply brakes. b. RETURN TO WORK:  ______________________________________________________________________________________ 9. You should see your doctor in the office for a follow-up appointment approximately two weeks after your surgery.  Your doctors nurse will typically make your follow-up appointment when she calls you with your pathology report.  Expect your pathology report 2-3 business days after your surgery.  You may call to check if you do not hear from [place] after three days. 10. OTHER INSTRUCTIONS: __OK TO SHOWER TOMORROW 11. ICE PACK AND TYLENOL ALSO FOR PIAN_____________________________________________________________________________________________ _____________________________________________________________________________________________________________________________________ _____________________________________________________________________________________________________________________________________ _____________________________________________________________________________________________________________________________________  WHEN TO CALL YOUR DOCTOR: 1. Fever over 101.0 2. Nausea and/or vomiting. 3. Extreme swelling or bruising. 4. Continued bleeding from incision. 5. Increased pain, redness, or drainage from the incision.  The clinic staff is available to answer your questions during regular business hours.  Please dont hesitate to call and ask to speak to one of the nurses for clinical  concerns.  If you have a medical emergency, go to the nearest emergency room or call 911.  A surgeon from [facility] is always on call  at the hospital. ° °For further questions, please visit [email or web address]  °

## 2016-07-09 NOTE — Anesthesia Postprocedure Evaluation (Addendum)
Anesthesia Post Note  Patient: Felicia Gonzalez  Procedure(s) Performed: Procedure(s) (LRB): BREAST LUMPECTOMY WITH RADIOACTIVE SEED AND SENTINEL LYMPH NODE BIOPSY (Left)  Patient location during evaluation: PACU Anesthesia Type: General Level of consciousness: awake and alert Pain management: pain level controlled Vital Signs Assessment: post-procedure vital signs reviewed and stable Respiratory status: spontaneous breathing and respiratory function stable Cardiovascular status: stable Anesthetic complications: no       Last Vitals:  Vitals:   07/09/16 0921 07/09/16 0945  BP: (!) 145/81   Pulse: 81 81  Resp: (!) 23 16  Temp:  36.7 C    Last Pain:  Vitals:   07/09/16 0607  TempSrc: Oral                 Skyann Ganim DANIEL

## 2016-07-09 NOTE — Op Note (Signed)
BREAST LUMPECTOMY WITH RADIOACTIVE SEED AND SENTINEL LYMPH NODE BIOPSY  Procedure Note  Felicia Gonzalez 07/09/2016   Pre-op Diagnosis: LEFT BREAST CANCER     Post-op Diagnosis: same  Procedure(s): BREAST LUMPECTOMY WITH RADIOACTIVE SEED AND DEEP LEFT AXILLARY SENTINEL LYMPH NODE BIOPSY  Surgeon(s): Coralie Keens, MD  Anesthesia: General  Staff:  Circulator: Nicholos Johns, RN Scrub Person: Adella Hare Circulator Assistant: Rosanne Sack, RN; Christen Bame, RN  Estimated Blood Loss: Minimal               Specimens: sent to path  Indications: This is a 76 year old female with a stage I left breast cancer at the 8:00 position. Decision has been made to proceed with a radioactive seed localized left breast lumpectomy and left axillary sentinel lymph node biopsy  Procedure: The patient was taken to the operating room and identified as correct patient. She is placed upon the operating table and general anesthesia was induced. She had already had a radioactive seed placed as well as radioactive isotope injected around the areola. Identified the area of increased uptake at the inner lower quadrant of the left breast. I anesthetized the skin around the areola and made a circumareolar incision with a scalpel. I took this down through the breast tissue with the photon blade. With the neoprobe I widely performed a lumpectomy around the radioactive seed. I took this down to the chest wall. I then completely excised the lumpectomy with the photon blade. Once the lumpectomy specimen was removed, I marked all margins with marker pain. X-ray confirmed that the previous marker and seed were in the specimen. It was then sent to pathology for evaluation. I then identified an area of increased uptake in the left axilla. I anesthetized the skin with Marcaine. I then made a small incision in the axilla with the photon blade. I took this down into the deep axillary tissue. With the aid of neoprobe  identified one large deep left axillary lymph node which I completely excised with the photon blade. It was sent to pathology for evaluation. I again reexamined the axilla and found no further palpable nodes and there was no increased uptake of radioactivity. I then anesthetized both incisions further with Marcaine. I placed surgical clips at the lumpectomy site for marking purposes. I then closed the left pectoral site layers of 3-0 Vicryl suture. I then closed the subcutaneous tissue of the axilla and 3-0 Vicryl sutures. I then closed both incisions with 4-0 Monocryl sutures.  Skin glue was then applied. The patient tolerated the procedure well. All the counts were correct at the end of the procedure. The patient was then extubated in the operating room and taken in a stable condition to the recovery room.          [name] A   Date: 07/09/2016  Time: 8:47 AM

## 2016-07-10 ENCOUNTER — Encounter (HOSPITAL_COMMUNITY): Payer: Self-pay | Admitting: Surgery

## 2016-07-21 ENCOUNTER — Other Ambulatory Visit (INDEPENDENT_AMBULATORY_CARE_PROVIDER_SITE_OTHER): Payer: Self-pay | Admitting: Rheumatology

## 2016-07-21 NOTE — Telephone Encounter (Signed)
Patient calling to renew Rx for Fosamax Also would like to discuss when she should come in for her blood work.  Please advise.  [phone]

## 2016-07-22 MED ORDER — ALENDRONATE SODIUM 70 MG PO TABS: 70.0000 mg | ORAL_TABLET | ORAL | 0 refills | Status: DC

## 2016-07-22 NOTE — Telephone Encounter (Signed)
ok 

## 2016-07-22 NOTE — Telephone Encounter (Signed)
Patient advised next labs due in April.  Last Visit: 06/09/16 Next Visit: 09/08/16 Labs: 07/05/16 Mild anemia   Okay to refill Fosamax?

## 2016-08-10 ENCOUNTER — Telehealth (INDEPENDENT_AMBULATORY_CARE_PROVIDER_SITE_OTHER): Payer: Self-pay | Admitting: Rheumatology

## 2016-08-10 NOTE — Telephone Encounter (Signed)
Patient left a message stating that she needed to talk to either Amy or Seth Bake about her medications.  NM#[phone].  Thank you.

## 2016-08-11 NOTE — Telephone Encounter (Signed)
ok 

## 2016-08-11 NOTE — Telephone Encounter (Signed)
She can take Arava, Plaquenil and prednisone as combination therapy. Please check labs in 1 month then every 2 months.

## 2016-08-11 NOTE — Telephone Encounter (Signed)
Patient needs refills on PLQ and Prednisone.  Okay to send refills on PLQ and Prednisone?

## 2016-08-11 NOTE — Telephone Encounter (Signed)
Patient left another message in regards to her medications.  Cb#[id number].  Thank you.

## 2016-08-11 NOTE — Telephone Encounter (Signed)
Patient would like to know if she is to be taking the Arava, PLQ and the prednisone all together. Patient states she is having pain in her whole body. Patient states the pain is increased in the right from elbow to the shoulder.  Patient states she is currently on Prednisone 5 mg and Arava 20 mg daily.

## 2016-08-11 NOTE — Addendum Note (Signed)
Addended by: Carole Binning on: 08/11/2016 04:40 PM   Modules accepted: Orders

## 2016-08-12 MED ORDER — HYDROXYCHLOROQUINE SULFATE 200 MG PO TABS: ORAL_TABLET | ORAL | 1 refills | Status: DC

## 2016-08-12 MED ORDER — PREDNISONE 5 MG PO TABS: 5.0000 mg | ORAL_TABLET | Freq: Every day | ORAL | 1 refills | Status: DC

## 2016-08-12 NOTE — Addendum Note (Signed)
Addended by: Carole Binning on: 08/12/2016 08:28 AM   Modules accepted: Orders

## 2016-08-12 NOTE — Telephone Encounter (Signed)
Prescriptions sent to the pharmacy. Left message to advise patient.

## 2016-08-23 ENCOUNTER — Ambulatory Visit (HOSPITAL_BASED_OUTPATIENT_CLINIC_OR_DEPARTMENT_OTHER): Payer: Medicare Other | Admitting: Hematology

## 2016-08-23 ENCOUNTER — Telehealth: Payer: Self-pay | Admitting: Hematology

## 2016-08-23 ENCOUNTER — Encounter (INDEPENDENT_AMBULATORY_CARE_PROVIDER_SITE_OTHER): Payer: Self-pay

## 2016-08-23 ENCOUNTER — Encounter: Payer: Self-pay | Admitting: Radiation Oncology

## 2016-08-23 ENCOUNTER — Other Ambulatory Visit (HOSPITAL_BASED_OUTPATIENT_CLINIC_OR_DEPARTMENT_OTHER): Payer: Medicare Other

## 2016-08-23 VITALS — BP 174/87 | HR 88 | Temp 98.9°F | Resp 18 | Ht 61.0 in | Wt 136.9 lb

## 2016-08-23 DIAGNOSIS — C50312 Malignant neoplasm of lower-inner quadrant of left female breast: Secondary | ICD-10-CM

## 2016-08-23 DIAGNOSIS — C50912 Malignant neoplasm of unspecified site of left female breast: Secondary | ICD-10-CM | POA: Diagnosis not present

## 2016-08-23 DIAGNOSIS — Z170 Estrogen receptor positive status [ER+]: Secondary | ICD-10-CM

## 2016-08-23 DIAGNOSIS — M810 Age-related osteoporosis without current pathological fracture: Secondary | ICD-10-CM

## 2016-08-23 DIAGNOSIS — M069 Rheumatoid arthritis, unspecified: Secondary | ICD-10-CM | POA: Diagnosis not present

## 2016-08-23 LAB — COMPREHENSIVE METABOLIC PANEL
ALBUMIN: 3.5 g/dL (ref 3.5–5.0)
ALT: 27 U/L (ref 0–55)
ANION GAP: 10 meq/L (ref 3–11)
AST: 27 U/L (ref 5–34)
Alkaline Phosphatase: 111 U/L (ref 40–150)
BUN: 9.7 mg/dL (ref 7.0–26.0)
CALCIUM: 10 mg/dL (ref 8.4–10.4)
CHLORIDE: 102 meq/L (ref 98–109)
CO2: 29 meq/L (ref 22–29)
Creatinine: 0.7 mg/dL (ref 0.6–1.1)
EGFR: 81 mL/min/{1.73_m2} — AB (ref >90)
Glucose: 81 mg/dl (ref 70–140)
POTASSIUM: 3.9 meq/L (ref 3.5–5.1)
Sodium: 140 mEq/L (ref 136–145)
Total Bilirubin: 0.31 mg/dL (ref 0.20–1.20)
Total Protein: 7.4 g/dL (ref 6.4–8.3)

## 2016-08-23 LAB — CBC & DIFF AND RETIC
BASO%: 0.7 % (ref 0.0–2.0)
BASOS ABS: 0.1 10*3/uL (ref 0.0–0.1)
EOS ABS: 0.2 10*3/uL (ref 0.0–0.5)
EOS%: 2.9 % (ref 0.0–7.0)
HEMATOCRIT: 33 % — AB (ref 34.8–46.6)
HEMOGLOBIN: 10.6 g/dL — AB (ref 11.6–15.9)
Immature Retic Fract: 6.2 % (ref 1.60–10.00)
LYMPH%: 32.5 % (ref 14.0–49.7)
MCH: 27.2 pg (ref 25.1–34.0)
MCHC: 32.1 g/dL (ref 31.5–36.0)
MCV: 84.6 fL (ref 79.5–101.0)
MONO#: 0.9 10*3/uL (ref 0.1–0.9)
MONO%: 12.3 % (ref 0.0–14.0)
NEUT#: 3.7 10*3/uL (ref 1.5–6.5)
NEUT%: 51.6 % (ref 38.4–76.8)
PLATELETS: 296 10*3/uL (ref 145–400)
RBC: 3.9 10*6/uL (ref 3.70–5.45)
RDW: 15.8 % — ABNORMAL HIGH (ref 11.2–14.5)
Retic %: 1.05 % (ref 0.70–2.10)
Retic Ct Abs: 40.95 10*3/uL (ref 33.70–90.70)
WBC: 7.2 10*3/uL (ref 3.9–10.3)
lymph#: 2.4 10*3/uL (ref 0.9–3.3)

## 2016-08-23 NOTE — Telephone Encounter (Signed)
Gave patient avs report and appointments for May. Patient also given appointments for radiation oncology 3/28 and bone density test 4/30.

## 2016-08-24 LAB — VITAMIN D 25 HYDROXY (VIT D DEFICIENCY, FRACTURES): VIT D 25 HYDROXY: 62.5 ng/mL (ref 30.0–100.0)

## 2016-08-24 NOTE — Progress Notes (Addendum)
X Location of Breast Cancer: Left breast 8 o'clock  Position   Histology per Pathology Report: Diagnosis 05/14/2016: 1. Breast, left, needle core biopsy, calcifications lower inner quadrant - FIBROCYSTIC CHANGES WITH CALCIFICATIONS.- PSEUDOANGIOMATOUS STROMAL HYPERPLASIA ([place]).- NO EVIDENCE OF MALIGNANCY. 2. Breast, left, needle core biopsy, mass 8:00 o'clock - INVASIVE DUCTAL CARCINOMA, GRADE 2/3.- LYMPHOVASCULAR SPACE INVOLVEMENT BY TUMOR.- SEE MICROSCOPIC DESCRIPTION. 3. Breast, right, needle core biopsy, mass 10:00 o'clock - PSEUDOANGIOMATOUS STROMAL HYPERPLASIA ([place]).- FIBROCYSTIC CHANGES.- NO EVIDENCE OF MALIGNANCY  Receptor Status: ER(100%+), PR (5%+), Her2-neu (neg. Ratio=1.27), Ki-67(15%)  Did patient present with symptoms (if so, please note symptoms) or was this found on screening mammography?: Routine mammogram    Past/Anticipated interventions by surgeon, if any:  07/09/2016 : Coralie Keens, MD , Follow up 07/31/16 Surgery     BREAST LUMPECTOMY WITH RADIOACTIVE SEED AND SENTINEL LYMPH NODE BIOPSY  Procedure Note          Procedure(s): BREAST LUMPECTOMY WITH RADIOACTIVE SEED AND DEEP LEFT AXILLARY SENTINEL LYMPH NODE BIOPSY  Surgeon(s): Coralie Keens, MD       Past/Anticipated interventions by medical oncology, if any: Chemotherapy : Dr. Irene Limbo  Seen 08/23/16 ;, next appt 10/04/16 next   Lymphedema issues, if any:  NO  Pain issues, if any:  NO  SAFETY ISSUES:  Prior radiation? NO  Pacemaker/ICD? NO  Possible current pregnancy? N/A  Is the patient on methotrexate? NO  Current Complaints / other details:   menses age  82-17, , G88P2, no alcohol, or tobacco products, no drug use  Father prostate cancer,htn,    BP (!) 152/83 (BP Location: Right Arm, Patient Position: Sitting, Cuff Size: Normal)   Pulse (!) 105   Temp 98.3 F (36.8 C) (Oral)   Resp 18   Ht 5' 1" (1.549 m)   Wt 135 lb 6.4 oz (61.4 kg)   BMI 25.58 kg/m   Wt Readings from Last  3 Encounters:  08/25/16 135 lb 6.4 oz (61.4 kg)  08/23/16 136 lb 14.4 oz (62.1 kg)  07/09/16 139 lb (63 kg)   Rebecca Eaton, RN 08/24/2016,8:32 AM

## 2016-08-25 ENCOUNTER — Ambulatory Visit
Admission: RE | Admit: 2016-08-25 | Discharge: 2016-08-25 | Disposition: A | Discharge status: Home / Self Care | Payer: Medicare Other | Source: Ambulatory Visit | Attending: Radiation Oncology | Admitting: Radiation Oncology

## 2016-08-25 ENCOUNTER — Encounter: Payer: Self-pay | Admitting: Radiation Oncology

## 2016-08-25 VITALS — BP 152/83 | HR 105 | Temp 98.3°F | Resp 18 | Ht 61.0 in | Wt 135.4 lb

## 2016-08-25 DIAGNOSIS — Z79899 Other long term (current) drug therapy: Secondary | ICD-10-CM | POA: Diagnosis not present

## 2016-08-25 DIAGNOSIS — Z880 Allergy status to penicillin: Secondary | ICD-10-CM | POA: Insufficient documentation

## 2016-08-25 DIAGNOSIS — Z7983 Long term (current) use of bisphosphonates: Secondary | ICD-10-CM | POA: Insufficient documentation

## 2016-08-25 DIAGNOSIS — Z96642 Presence of left artificial hip joint: Secondary | ICD-10-CM | POA: Insufficient documentation

## 2016-08-25 DIAGNOSIS — Z8249 Family history of ischemic heart disease and other diseases of the circulatory system: Secondary | ICD-10-CM | POA: Diagnosis not present

## 2016-08-25 DIAGNOSIS — M810 Age-related osteoporosis without current pathological fracture: Secondary | ICD-10-CM | POA: Insufficient documentation

## 2016-08-25 DIAGNOSIS — M069 Rheumatoid arthritis, unspecified: Secondary | ICD-10-CM | POA: Insufficient documentation

## 2016-08-25 DIAGNOSIS — Z7952 Long term (current) use of systemic steroids: Secondary | ICD-10-CM | POA: Insufficient documentation

## 2016-08-25 DIAGNOSIS — Z882 Allergy status to sulfonamides status: Secondary | ICD-10-CM | POA: Insufficient documentation

## 2016-08-25 DIAGNOSIS — Z510 Encounter for antineoplastic radiation therapy: Secondary | ICD-10-CM | POA: Diagnosis not present

## 2016-08-25 DIAGNOSIS — J45909 Unspecified asthma, uncomplicated: Secondary | ICD-10-CM | POA: Diagnosis not present

## 2016-08-25 DIAGNOSIS — C50312 Malignant neoplasm of lower-inner quadrant of left female breast: Secondary | ICD-10-CM | POA: Diagnosis not present

## 2016-08-25 DIAGNOSIS — H409 Unspecified glaucoma: Secondary | ICD-10-CM | POA: Diagnosis not present

## 2016-08-25 DIAGNOSIS — C50212 Malignant neoplasm of upper-inner quadrant of left female breast: Secondary | ICD-10-CM

## 2016-08-25 DIAGNOSIS — Z170 Estrogen receptor positive status [ER+]: Secondary | ICD-10-CM | POA: Diagnosis not present

## 2016-08-25 NOTE — Progress Notes (Signed)
Felicia Gonzalez  HEMATOLOGY ONCOLOGY CLINIC NOTE  Date of service: .08/23/2016  Patient Care Team: Ileana Ladd, MD as PCP - General (Family Medicine)  Chief Complaint: f/u for management of hormone positive Her 2 neg breast cancer  INTERVAL HISTORY:  Felicia Gonzalez is here for follow-up after having had her left breast lumpectomy on 07/19/2016. She has healed well from surgery with no significant postoperative issues. She is here to discuss recommendations for adjuvant treatment. We discussed in detail that although visible cancer has been removed. Her tumor was 1 cm in size ER positive PR positive HER-2/neu negative with Ki-67 15%, 1 SLN neg. Noted to have lymphovascular invasion, intermediate grade. We discussed the pathology results and details significance, and NCCN guidelines regarding treatment options, role for different modalities of adjuvant treatment and her specific preferences and recommendations regarding adjuvant therapy. Given her age >70 yrs, relatively low risk tumor and her personal preference to avoid chemotherapy we discussed that we will hold off on Oncotype DX testing.  We discussed the role of adjuvant endocrine therapy with aromatase inhibitors and the pros and cons. She has been referred to radiation oncology for postoperative radiation to her left breast after which we will see her back to define specifics of recommended endocrine therapy.  Notes that her rheumatoid arthritis has been fairly stable. Currently on Arava and prednisone 5 mg by mouth daily. Previously was on Orencia. She has a history of osteoporosis and is currently taking Fosamax.  REVIEW OF SYSTEMS:    10 Point review of systems of done and is negative except as noted above.  . Past Medical History:  Diagnosis Date  . Anemia   . Arthritis    rheumatoid +CCP neg RF +ANA synovitis   . Asthma   . Cancer [facility])    Breast cancer  . Glaucoma    right eye  . Osteoporosis      . Past Surgical History:    Procedure Laterality Date  . BREAST LUMPECTOMY WITH RADIOACTIVE SEED AND SENTINEL LYMPH NODE BIOPSY Left 07/09/2016   Procedure: BREAST LUMPECTOMY WITH RADIOACTIVE SEED AND SENTINEL LYMPH NODE BIOPSY;  Surgeon: Abigail Miyamoto, MD;  Location: [facility] OR;  Service: General;  Laterality: Left;  . NO PAST SURGERIES    . TOTAL HIP ARTHROPLASTY Left 10/01/2014   Procedure: TOTAL HIP ARTHROPLASTY;  Surgeon: Valeria Batman, MD;  Location: [facility];  Service: Orthopedics;  Laterality: Left;    . Social History  Substance Use Topics  . Smoking status: Never Smoker  . Smokeless tobacco: Never Used  . Alcohol use No    ALLERGIES:  is allergic to penicillins and sulfa antibiotics.  MEDICATIONS:  Current Outpatient Prescriptions  Medication Sig Dispense Refill  . acetaminophen (TYLENOL) 650 MG CR tablet Take 1,300 mg by mouth 2 (two) times daily as needed for pain. Take one hour prior to infusion     . albuterol (PROVENTIL HFA;VENTOLIN HFA) 108 (90 Base) MCG/ACT inhaler Inhale 1-2 puffs into the lungs every 6 (six) hours as needed for wheezing or shortness of breath.    Felicia Gonzalez alendronate (FOSAMAX) 70 MG tablet Take 1 tablet (70 mg total) by mouth once a week. Take with a full glass of water on an empty stomach. 12 tablet 0  . amLODipine (NORVASC) 2.5 MG tablet Take 2.5 mg by mouth daily.    . Cholecalciferol (VITAMIN D3) 5000 units TABS Take 5,000 Units by mouth daily after breakfast.    . hydroxychloroquine (PLAQUENIL) 200 MG tablet Take 1  tablet by mouth BID Monday thru Friday 120 tablet 1  . leflunomide (ARAVA) 20 MG tablet Take 1 tablet (20 mg total) by mouth daily. (Patient taking differently: Take 20 mg by mouth daily after breakfast. ) 90 tablet 0  . Multiple Vitamin (MULTIVITAMIN WITH MINERALS) TABS tablet Take 1 tablet by mouth daily.    . Omega-3 Fatty Acids (OMEGA-3 FISH OIL) 1200 MG CAPS Take 1,200 mg by mouth 2 (two) times daily.    Vladimir Faster Glycol-Propyl Glycol (SYSTANE OP) Place 1 drop into  the left eye at bedtime.    . predniSONE (DELTASONE) 5 MG tablet Take 1 tablet (5 mg total) by mouth daily with breakfast. 90 tablet 1  . saccharomyces boulardii (FLORASTOR) 250 MG capsule Take 250 mg by mouth 2 (two) times daily.    . traMADol (ULTRAM) 50 MG tablet Take 1-2 tablets (50-100 mg total) by mouth every 6 (six) hours as needed. (Patient not taking: Reported on 08/25/2016) 20 tablet 0  . TRAVATAN Z 0.004 % SOLN ophthalmic solution Place 1 drop into the right eye at bedtime.    . TURMERIC PO Take 1 capsule by mouth daily after breakfast.     No current facility-administered medications for this visit.     PHYSICAL EXAMINATION: ECOG PERFORMANCE STATUS: 1 - Symptomatic but completely ambulatory  . Vitals:   08/23/16 1042  BP: (!) 174/87  Pulse: 88  Resp: 18  Temp: 98.9 F (37.2 C)    Filed Weights   08/23/16 1042  Weight: 136 lb 14.4 oz (62.1 kg)   .Body mass index is 25.87 kg/m.  GENERAL:alert, in no acute distress and comfortable SKIN: skin color, texture, turgor are normal, no rashes or significant lesions EYES: normal, conjunctiva are pink and non-injected, sclera clear OROPHARYNX:no exudate, no erythema and lips, buccal mucosa, and tongue normal  NECK: supple, no JVD, thyroid normal size, non-tender, without nodularity LYMPH:  no palpable lymphadenopathy in the cervical, axillary or inguinal LUNGS: clear to auscultation with normal respiratory effort BREAST- left breast healed lumpectomy scar. No other palpable lumps or regional LNpathy HEART: regular rate & rhythm,  no murmurs and no lower extremity edema ABDOMEN: abdomen soft, non-tender, normoactive bowel sounds  PSYCH: alert & oriented x 3 with fluent speech NEURO: no focal motor/sensory deficits  LABORATORY DATA:   I have reviewed the data as listed  . CBC Latest Ref Rng & Units 08/23/2016 07/05/2016 04/12/2016  WBC 3.9 - 10.3 10e3/uL 7.2 8.0 7.6  Hemoglobin 11.6 - 15.9 g/dL 10.6(L) 10.9(L) 10.5(L)    Hematocrit 34.8 - 46.6 % 33.0(L) 33.8(L) 32.2(L)  Platelets 145 - 400 10e3/uL 296 331 294    . CMP Latest Ref Rng & Units 08/23/2016 07/05/2016 04/12/2016  Glucose 70 - 140 mg/dl 81 157(H) 92  BUN 7.0 - 26.0 mg/dL 9.'7 12 9  '$ Creatinine 0.6 - 1.1 mg/dL 0.7 0.70 0.75  Sodium 136 - 145 mEq/L 140 136 138  Potassium 3.5 - 5.1 mEq/L 3.9 4.4 3.7  Chloride 98 - 110 mmol/L - 97(L) 103  CO2 22 - 29 mEq/L '29 31 28  '$ Calcium 8.4 - 10.4 mg/dL 10.0 9.7 9.3  Total Protein 6.4 - 8.3 g/dL 7.4 7.1 6.4(L)  Total Bilirubin 0.20 - 1.20 mg/dL 0.31 0.4 0.1(L)  Alkaline Phos 40 - 150 U/L 111 126 98  AST 5 - 34 U/L 27 39(H) 34  ALT 0 - 55 U/L 27 42(H) 36    25 OH vit D - 62.5  Microscopic Comment 1. BREAST, INVASIVE TUMOR Procedure: Seed localized lumpectomy. Laterality: Left breast. Tumor Size (gross measurement): 1.0 cm Histologic Type: Ductal Grade: II Tubular Differentiation: 3 Nuclear Pleomorphism: 2 Mitotic Count: 1 Ductal Carcinoma in Situ (DCIS): Not identified Extent of Tumor: Confined to breast parenchyma Margins: Invasive carcinoma, distance from closest margin: Focally 0.1 cm to the anterior margin Regional Lymph Nodes: Number of Lymph Nodes Examined: 1 Number of Sentinel Lymph Nodes Examined: 1 Lymph Nodes with Macrometastases: 0 Lymph Nodes with Micrometastases: 0 Lymph Nodes with Isolated Tumor Cells: 0 Breast Prognostic Profile: [id number] Estrogen Receptor: 100%, strong Progesterone Receptor: 5%, strong Her2: No amplification was detected. The ratio was 1.27 1 of 3 FINAL for VENETA, SLITER B [id number]) Microscopic Comment(continued) Ki-67: 15% Pathologic Stage Classification (pTNM, AJCC 8th Edition): Primary Tumor (pT): pT1b Regional Lymph Nodes (pN): pN0 (JBK:ecj 07/12/2016) Enid Cutter MD Pathologist, Electronic Signature (Case signed 07/12/2016)     CLINICAL DATA:  Followup for probably benign right breast mass and probably benign left breast  calcifications.  EXAM: 2D DIGITAL DIAGNOSTIC BILATERAL MAMMOGRAM WITH CAD AND ADJUNCT TOMO  BILATERAL BREAST ULTRASOUND  COMPARISON:  Previous exam(s).  ACR Breast Density Category c: The breast tissue is heterogeneously dense, which may obscure small masses.  FINDINGS: No suspicious masses or calcifications are seen in the right breast. The oval circumscribed mass in the upper-outer posterior right breast appears unchanged. Spot compression magnification views were performed over the inner left breast demonstrating unchanged 3 mm group of punctate calcifications. There is a mass with irregular margins in the lower inner left breast measuring approximately 7-8 mm.  Mammographic images were processed with CAD.  Physical examination of the left breast reveals a firm nodule at the 8 o'clock position.  Targeted ultrasound of the left breast was performed demonstrating an irregular hypoechoic mass at 8 o'clock 2 cm from nipple measuring 0.8 x 0.6 x 0.7 cm. This corresponds well with the mass seen at mammography. No lymphadenopathy seen in the left axilla.  Targeted ultrasound of the right breast was performed demonstrating a mixed echogenicity mass at 10 o'clock 8 cm from nipple measuring 1.4 x 0.6 x 1.1 cm. Although this may represent an area of fat necrosis is indeterminate. No lymphadenopathy seen in the right axilla.  IMPRESSION: 1. Suspicious mass in the left breast at the 8 o'clock position.  2. Stable appearance of punctate calcifications in the lower inner left breast.  3. Indeterminate mass in the right breast at 10 o'clock 8 cm from the nipple which cannot be clearly characterized as an area of fat necrosis.  RECOMMENDATION: 1. Recommend ultrasound-guided biopsy of the mass in the left breast at the 8 o'clock position.  2. Recommend stereotactic guided biopsy of the calcifications in the lower inner left breast (given the suspicious mass seen in  close proximity to the calcifications).  3. Recommend ultrasound-guided biopsy of the mass in the right breast at 10 o'clock position.  Biopsies are being scheduled for the patient.  I have discussed the findings and recommendations with the patient. Results were also provided in writing at the conclusion of the visit. If applicable, a reminder letter will be sent to the patient regarding the next appointment.  BI-RADS CATEGORY  4: Suspicious.   Electronically Signed   By: Everlean Alstrom M.D.   On: 05/12/2016 13:56   RADIOGRAPHIC STUDIES: I have personally reviewed the radiological images as listed and agreed with the findings in the report. No results found.  ASSESSMENT & PLAN:  76 year old female with  #1 Newly diagnosed Stage I (pT1b, pN0, Mx) left sided invasive ductal carcinoma of the breast. 1 cm in size with lymphovascular invasion grade 2/3, Ki-67 15% ER/PR positive HER-2/neu negative. One Sentinel lymph node biopsy negative No DCIS. Severe calcified benign lesions in left breast with PASH (pseudoangiomatous stromal hyperplasia).  Patient is status post left breast lumpectomy by Dr. Ninfa Linden on 07/09/2016. She has healed well from surgery Plan -Patient's pathology results were discussed in details with her and her husband. -Given her age more than 69 years and her breast cancer staging at this time and her personal preferences would not want to consider adjuvant chemotherapy which is a reasonable choice. We shall hold off on Oncotype DX testing. -Patient has been referred to radiation oncology for adjuvant breast radiation therapy -Would recommend adjuvant endocrine therapy after completion of  adjuvant RT. -She has a history of osteoporosis and has not had a recent bone density scan. We have ordered one she is at high risk for progression for osteoporosis due to steroid use, rheumatoid arthritis and her age and possible use of aromatase inhibitor. -Vitamin D  levels look good at 62.5 . -She was recommended to stay physically active and pursue weightbearing exercises . -Based on bone density scan we might need to make a decision to treat her more aggressively with Prolia for osteoporosis [instead of Fosamax that she is on currently]    -We shall see her back in 6 weeksto discuss adjuvant endocrine treatment options.  #2 Rheumatoid arthritis currently on Arava and prednisone. Was previously on Orencia. Plan -Continue follow-up with Dr. Estanislado Pandy from rheumatology for ongoing treatments .  #3 likely osteoporosis as per patient we do not have access to her DEXA scan results at this time . Her prednisone use is certainly a risk factor for this as is her age. She has had a left subcapital femur fracture already. -patient to continue  calcium and vitamin D and is currently on fosamax. -repeat a Bone density scan ordered to be done  prior to considering Aromatase inhibitor -we might need to switch her to Reclast or Prolia if there is still significant bone loss.  -Radiation Oncology referral for consideration of post lumpectomy RT for Stage I breast cancer -bone density study in 5-6 weeks RTC with Dr Irene Limbo in 6 weeks with labs   I spent 30 minutes counseling the patient face to face. The total time spent in the appointment was 40 minutes and more than 50% was on counseling and direct patient cares.    Sullivan Lone MD [place] AAHIVMS [facility] Hematology/Oncology Physician [facility]  (Office):       [phone] (Work cell):  [phone] (Fax):           [phone]

## 2016-08-25 NOTE — Progress Notes (Signed)
Radiation Oncology         [phone] ________________________________  Name: Felicia Gonzalez MRN: [id number]  Date: 08/25/2016  DOB: [birth year]  MA:YOKH,TXHFSFS Saralyn Pilar, MD  [name], MD     REFERRING PHYSICIAN: [name], MD   DIAGNOSIS: The encounter diagnosis was Malignant neoplasm of lower-inner quadrant of left female breast, unspecified estrogen receptor status ([place]). Stage IA (pT1b, pN0) grade 2 invasive ductal carcinoma of the left breast (ER+,PR+,HER2-)  HISTORY OF PRESENT ILLNESS: Felicia Gonzalez is a 76 y.o. female seen at the request of Dr. Irene Limbo. On 04/04/15, the patient had a bilateral screening mammogram showing a possible right breast mass and left breast calcifications. Bilateral diagnostic breast mammogram and ultrasound on 05/05/15 shoed a right breast mass in the 10:00 position 8 cm from the nipple measuring 1.3 x 0.7 x 0.9 cm somewhat reminiscent of fat necrosis and grouped punctate calcifications in the inner left breast middle depth spanning 0.3 cm. 6 month follow up was recommended.  Bilateral diagnostic breast mammogram and ultrasound on 11/26/15 were stable with the above findings. Repeat imaging in 6 months was recommended. Repeat imaging on 05/12/16 showed no suspicious masses or calcifications in the right breast with the oval circumscribed mass in the UOQ posterior right breast unchanged. The 0.3 cm group of punctate calcifications in the left breast were unchanged. However, a new irregular hypoechoic mass in the 8:00 position 2 cm from the nipple measruing 0.8 x 0.6 x 0.7 cm in the left breast was noted. No lymphadenopathy was seen in the left axilla. Biopsies were recommended of all three areas. Biopsies were performed on 05/14/16 and including the LIQ left breast calcifications revealed fibrocystic changes with calcifications, PASH, and no evidence of malignancy. Biopsy of the left breast mass in the 8:00 position revealed grade 2 invasive ductal  carcinoma and lymphovascular space involvement by tumor (ER 100% +, PR 5% +, HER2 -, Ki67 15%) Biopsy of the right breast mass in the 10:00 position revealed PASH, fibrocystic changes, and no evidence of malignancy.  The patient underwent a left breast lumpectomy and sentinel lymph node biopsy on 07/09/16 by Dr. Ninfa Linden. Lumpectomy revealed grade 2 invasive ductal carcinoma measuring 1.0 cm, lymphovascular invasion, and invasive carcinoma was focally 0.1 cm to the anterior margin. DCIS was not identified. Biopsy of a left axillary sentinel lymph node was negative.The patient and her husband present today to discuss radiation for the management of her disease.   PREVIOUS RADIATION THERAPY: No   PAST MEDICAL HISTORY:  Past Medical History:  Diagnosis Date  . Anemia   . Arthritis    rheumatoid +CCP neg RF +ANA synovitis   . Asthma   . Cancer [facility])    Breast cancer  . Glaucoma    right eye  . Osteoporosis        PAST SURGICAL HISTORY: Past Surgical History:  Procedure Laterality Date  . BREAST LUMPECTOMY WITH RADIOACTIVE SEED AND SENTINEL LYMPH NODE BIOPSY Left 07/09/2016   Procedure: BREAST LUMPECTOMY WITH RADIOACTIVE SEED AND SENTINEL LYMPH NODE BIOPSY;  Surgeon: Coralie Keens, MD;  Location: [place];  Service: General;  Laterality: Left;  . NO PAST SURGERIES    . TOTAL HIP ARTHROPLASTY Left 10/01/2014   Procedure: TOTAL HIP ARTHROPLASTY;  Surgeon: Garald Balding, MD;  Location: [place];  Service: Orthopedics;  Laterality: Left;     FAMILY HISTORY:  Family History  Problem Relation Age of Onset  . Hypertension Father  SOCIAL HISTORY:  reports that she has never smoked. She has never used smokeless tobacco. She reports that she does not drink alcohol or use drugs. 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 She has two adult daughters.   ALLERGIES: Penicillins and Sulfa antibiotics   MEDICATIONS:  Current Outpatient Prescriptions  Medication Sig Dispense Refill    . alendronate (FOSAMAX) 70 MG tablet Take 1 tablet (70 mg total) by mouth once a week. Take with a full glass of water on an empty stomach. 12 tablet 0  . amLODipine (NORVASC) 2.5 MG tablet Take 2.5 mg by mouth daily.    . Cholecalciferol (VITAMIN D3) 5000 units TABS Take 5,000 Units by mouth daily after breakfast.    . hydroxychloroquine (PLAQUENIL) 200 MG tablet Take 1 tablet by mouth BID Monday thru Friday 120 tablet 1  . leflunomide (ARAVA) 20 MG tablet Take 1 tablet (20 mg total) by mouth daily. (Patient taking differently: Take 20 mg by mouth daily after breakfast. ) 90 tablet 0  . Multiple Vitamin (MULTIVITAMIN WITH MINERALS) TABS tablet Take 1 tablet by mouth daily.    . Omega-3 Fatty Acids (OMEGA-3 FISH OIL) 1200 MG CAPS Take 1,200 mg by mouth 2 (two) times daily.    Vladimir Faster Glycol-Propyl Glycol (SYSTANE OP) Place 1 drop into the left eye at bedtime.    . predniSONE (DELTASONE) 5 MG tablet Take 1 tablet (5 mg total) by mouth daily with breakfast. 90 tablet 1  . saccharomyces boulardii (FLORASTOR) 250 MG capsule Take 250 mg by mouth 2 (two) times daily.    . TRAVATAN Z 0.004 % SOLN ophthalmic solution Place 1 drop into the right eye at bedtime.    . TURMERIC PO Take 1 capsule by mouth daily after breakfast.    . acetaminophen (TYLENOL) 650 MG CR tablet Take 1,300 mg by mouth 2 (two) times daily as needed for pain. Take one hour prior to infusion     . albuterol (PROVENTIL HFA;VENTOLIN HFA) 108 (90 Base) MCG/ACT inhaler Inhale 1-2 puffs into the lungs every 6 (six) hours as needed for wheezing or shortness of breath.    . traMADol (ULTRAM) 50 MG tablet Take 1-2 tablets (50-100 mg total) by mouth every 6 (six) hours as needed. (Patient not taking: Reported on 08/25/2016) 20 tablet 0   No current facility-administered medications for this encounter.      REVIEW OF SYSTEMS: On review of systems, the patient reports that she is doing well overall. She denies any chest pain, shortness of  breath, cough, fevers, chills, night sweats, unintended weight changes. She denies any bowel or bladder disturbances, and denies abdominal pain, nausea or vomiting. She denies any new musculoskeletal or joint aches or pains. A complete review of systems is obtained and is otherwise negative.     PHYSICAL EXAM:  Wt Readings from Last 3 Encounters:  08/25/16 135 lb 6.4 oz (61.4 kg)  08/23/16 136 lb 14.4 oz (62.1 kg)  07/09/16 139 lb (63 kg)   Temp Readings from Last 3 Encounters:  08/25/16 98.3 F (36.8 C) (Oral)  08/23/16 98.9 F (37.2 C) (Oral)  07/09/16 98 F (36.7 C)   BP Readings from Last 3 Encounters:  08/25/16 (!) 152/83  08/23/16 (!) 174/87  07/09/16 (!) 145/81   Pulse Readings from Last 3 Encounters:  08/25/16 (!) 105  08/23/16 88  07/09/16 81   Pain Assessment Pain Score: 0-No pain/10  In general this is a well appearing Asian woman in  no acute distress. She is alert and oriented x4 and appropriate throughout the examination. HEENT reveals that the patient is normocephalic, atraumatic. EOMs are intact. PERRLA. Skin is intact without any evidence of gross lesions. Cardiovascular exam reveals a regular rate and rhythm, no clicks rubs or murmurs are auscultated. Chest is clear to auscultation bilaterally. Lymphatic assessment is performed and does not reveal any adenopathy in the cervical, supraclavicular, axillary, or inguinal chains. Bilateral breast exam reveals palpable fullness of hte left breast consistent with her recent biopsy with ecchymosis. No lesions are palpable otherwise in the left, or in the right breast.  No nipple discharge or bleeding is noted of either breast. Abdomen has active bowel sounds in all quadrants and is intact. The abdomen is soft, non tender, non distended. Lower extremities are negative for pretibial pitting edema, deep calf tenderness, cyanosis or clubbing.   ECOG = 0  0 - Asymptomatic (Fully active, able to carry on all predisease  activities without restriction)  1 - Symptomatic but completely ambulatory (Restricted in physically strenuous activity but ambulatory and able to carry out work of a light or sedentary nature. For example, light housework, office work)  2 - Symptomatic, <50% in bed during the day (Ambulatory and capable of all self care but unable to carry out any work activities. Up and about more than 50% of waking hours)  3 - Symptomatic, >50% in bed, but not bedbound (Capable of only limited self-care, confined to bed or chair 50% or more of waking hours)  4 - Bedbound (Completely disabled. Cannot carry on any self-care. Totally confined to bed or chair)  5 - Death   Eustace Pen MM, Creech RH, Tormey DC, et al. [phone]). "Toxicity and response criteria of the [facility]". [place] Oncol. 5 (6): 649-55    LABORATORY DATA:  Lab Results  Component Value Date   WBC 7.2 08/23/2016   HGB 10.6 (L) 08/23/2016   HCT 33.0 (L) 08/23/2016   MCV 84.6 08/23/2016   PLT 296 08/23/2016   Lab Results  Component Value Date   NA 140 08/23/2016   K 3.9 08/23/2016   CL 97 (L) 07/05/2016   CO2 29 08/23/2016   Lab Results  Component Value Date   ALT 27 08/23/2016   AST 27 08/23/2016   ALKPHOS 111 08/23/2016   BILITOT 0.31 08/23/2016      RADIOGRAPHY: No results found.     IMPRESSION/PLAN: 1. Stage IA (pT1b, pN0) grade 2 invasive ductal carcinoma of the left breast (ER+,PR+,HER2-). Dr. Lisbeth Renshaw discussed the pathology findings and reviews the nature of breast cancer. The patient underwent breast conservation surgery with a left lumpectomy and sentinel node mapping. Per Dr. Grier Mitts notes, he is not ordering oncotype testing, as it does not appear that's she's a candidate for chemotherapy, therefore the patient's course would be followed by external radiotherapy to the left breast followed by antiestrogen therapy. We discussed the risks, benefits, short, and long term effects of radiotherapy,  and the patient is interested in proceeding. Dr. Lisbeth Renshaw discussed the delivery and logistics of radiotherapy, and Dr. Lisbeth Renshaw recommends a course of 4 weeks. Dr. Lisbeth Renshaw discussed the use of cardiac sparing with deep inspiration breath hold if needed given her left sided disease. The patient signed a consent form and a copy was placed in her medical chart. The patient will proceed with CT simulation and the placement of tattoos today at [facility]. 2. Rheumatoid Arthritis. This is managed by Arava, Plaquenil, Tylenol, Tramadol,  and Prednisone. She should continue follow up with her rheumatologist. She denies use of Methotrexate.   The above documentation reflects my direct findings during this shared patient visit. Please see the separate note by Dr. Lisbeth Renshaw on this date for the remainder of the patient's plan of care.    Carola Rhine, PAC  This document serves as a record of services personally performed by Shona Simpson, PA-C and Kyung Rudd, MD. It was created on their behalf by Darcus Austin, a trained medical scribe. The creation of this record is based on the scribe's personal observations and the providers' statements to them. This document has been checked and approved by the attending provider.

## 2016-08-25 NOTE — Progress Notes (Signed)
Please see the Nurse Progress Note in the MD Initial Consult Encounter for this patient. 

## 2016-08-26 ENCOUNTER — Ambulatory Visit: Payer: Medicare Other | Admitting: Radiation Oncology

## 2016-08-26 DIAGNOSIS — Z170 Estrogen receptor positive status [ER+]: Secondary | ICD-10-CM | POA: Insufficient documentation

## 2016-08-26 DIAGNOSIS — Z510 Encounter for antineoplastic radiation therapy: Secondary | ICD-10-CM | POA: Diagnosis not present

## 2016-08-26 DIAGNOSIS — C50212 Malignant neoplasm of upper-inner quadrant of left female breast: Secondary | ICD-10-CM | POA: Insufficient documentation

## 2016-08-26 NOTE — Progress Notes (Signed)
  Radiation Oncology         [phone] ________________________________  Name: ARELYN GAUER MRN: [id number]  Date: 08/25/2016  DOB: [birth year]   DIAGNOSIS:     ICD-9-CM ICD-10-CM   1. Malignant neoplasm of upper-inner quadrant of left breast in female, estrogen receptor positive ([place]) 174.2 C50.212    V86.0 Z17.0     SIMULATION AND TREATMENT PLANNING NOTE  The patient presented for simulation prior to beginning her course of radiation treatment for her diagnosis of left-sided breast cancer. The patient was placed in a supine position on a breast board. A customized vac-lock bag was constructed and this complex treatment device will be used on a daily basis during her treatment. In this fashion, a CT scan was obtained through the chest area and an isocenter was placed near the chest wall within the breast.  The patient will be planned to receive a course of radiation initially to a dose of 42.5 Gy. This will consist of a whole breast radiotherapy technique. To accomplish this, 2 customized blocks have been designed which will correspond to medial and lateral whole breast tangent fields. This treatment will be accomplished at 2.65 Gy per fraction. A forward planning technique will also be evaluated to determine if this approach improves the plan. It is anticipated that the patient will then receive a 10 Gy boost to the seroma cavity which has been contoured. This will be accomplished at 2.5 Gy per fraction.   This initial treatment will consist of a 3-D conformal technique. The seroma has been contoured as the primary target structure. Additionally, dose volume histograms of both this target as well as the lungs and heart will also be evaluated. Such an approach is necessary to ensure that the target area is adequately covered while the nearby critical  normal structures are adequately spared.  Plan:  The final anticipated total dose therefore will correspond to 52.5  Gy.    _______________________________   Jodelle Gross, MD, PhD

## 2016-08-26 NOTE — Progress Notes (Signed)
  Radiation Oncology         [phone] ________________________________  Name: Felicia Gonzalez MRN: [id number]  Date: 08/25/2016  DOB: November 28, 1940  Optical Surface Tracking Plan:  Since intensity modulated radiotherapy (IMRT) and 3D conformal radiation treatment methods are predicated on accurate and precise positioning for treatment, intrafraction motion monitoring is medically necessary to ensure accurate and safe treatment delivery.  The ability to quantify intrafraction motion without excessive ionizing radiation dose can only be performed with optical surface tracking. Accordingly, surface imaging offers the opportunity to obtain 3D measurements of patient position throughout IMRT and 3D treatments without excessive radiation exposure.  I am ordering optical surface tracking for this patient's upcoming course of radiotherapy. ________________________________  Kyung Rudd, MD 08/26/2016 7:24 AM    Reference:   Particia Jasper, et al. Surface imaging-based analysis of intrafraction motion for breast radiotherapy patients.Journal of [place], n. 6, nov. 2014. ISSN [id number].   Available at: <http://www.jacmp.org/index.php/jacmp/article/view/4957>.

## 2016-08-26 NOTE — Addendum Note (Signed)
Encounter addended by: Kyung Rudd, MD on: 08/26/2016  7:24 AM<BR>    Actions taken: Sign clinical note

## 2016-08-30 ENCOUNTER — Telehealth: Payer: Self-pay | Admitting: Radiation Oncology

## 2016-08-30 NOTE — Telephone Encounter (Signed)
The patient called last Friday with questions about recommendations for radiation treatment. I called her back this morning, but her phone does not seem to have VM set up. I will call her back later.

## 2016-08-30 NOTE — Telephone Encounter (Signed)
I called the patient back and again could not get through to a VM. I called her husband's mobile and left a message asking them to call back with questions.

## 2016-09-01 ENCOUNTER — Ambulatory Visit
Admission: RE | Admit: 2016-09-01 | Discharge: 2016-09-01 | Disposition: A | Discharge status: Home / Self Care | Payer: Medicare Other | Source: Ambulatory Visit | Attending: Radiation Oncology | Admitting: Radiation Oncology

## 2016-09-01 DIAGNOSIS — Z510 Encounter for antineoplastic radiation therapy: Secondary | ICD-10-CM | POA: Diagnosis not present

## 2016-09-02 ENCOUNTER — Ambulatory Visit
Admission: RE | Admit: 2016-09-02 | Discharge: 2016-09-02 | Disposition: A | Discharge status: Home / Self Care | Payer: Medicare Other | Source: Ambulatory Visit | Attending: Radiation Oncology | Admitting: Radiation Oncology

## 2016-09-02 ENCOUNTER — Telehealth: Payer: Self-pay | Admitting: *Deleted

## 2016-09-02 ENCOUNTER — Ambulatory Visit: Payer: Medicare Other

## 2016-09-02 NOTE — Telephone Encounter (Signed)
Called  Spoke with patient ,she cannot come in today at 430pm she has no transportation stated she would be in tomorrow at noon, and after rad tx to be seen by Dr. Sondra Come , she will nedd pt education also,called [facility] therapist and informed and Dr. Sondra Come   4:57 PM

## 2016-09-02 NOTE — Progress Notes (Signed)
Weekly rad txs breast, pt education done, radiaplex gel, alra deodorant, my business card, Radiation therapy and you book given, discusses skin irritation, swelling,sharp pain ,fatigue, apply skin products after rad tx and agin at bedtime,unless you are treated in the afternoons, then after rad tx and in am after showers,use luke warm showers, pat dry,no rubbing,scrubbing or scratching the breast treated, use of dove unscented soap preferred, increase protein in your diet,stay hydrated, drink plenty water,exercise, take rest period if needed,get enough sleep, if shaving use electric shaver, no under wire bras if possible 09/08/16

## 2016-09-03 ENCOUNTER — Encounter: Payer: Self-pay | Admitting: Radiation Oncology

## 2016-09-03 ENCOUNTER — Ambulatory Visit: Payer: Medicare Other

## 2016-09-03 NOTE — Progress Notes (Signed)
Office Visit Note  Patient: Felicia Gonzalez             Date of Birth: 1940-07-04           MRN: [id number]             PCP: Anthoney Harada, MD Referring: Vernie Shanks, MD Visit Date: 09/08/2016 Occupation: @GUAROCC @    Subjective:  Pain in multiple joints.   History of Present Illness: Felicia Gonzalez is a 76 y.o. female with history of rheumatoid arthritis some. She recently had lumpectomy for left breast cancer and a started radiation therapy. She had been off Orencia since December 2017. She's been on Arava 20 mg by mouth daily. Plaquenil was also added due to flare at 200 mg twice a day Monday to Friday. She's on prednisone 5 mg by mouth daily. Despite of taking these medication she continues to have joint pain and joint swelling in multiple arthralgias. Her arthritis is not as well-controlled as she was on IV Orencia. She reports mild swelling in her bilateral wrist joints.  Activities of Daily Living:  Patient reports morning stiffness for 0 minute.   Patient Reports nocturnal pain.  Difficulty dressing/grooming: Denies Difficulty climbing stairs: Reports Difficulty getting out of chair: Reports Difficulty using hands for taps, buttons, cutlery, and/or writing: Denies   Review of Systems  Constitutional: Positive for fatigue. Negative for night sweats, weight gain, weight loss and weakness.  HENT: Negative for mouth sores, trouble swallowing, trouble swallowing, mouth dryness and nose dryness.   Eyes: Negative for pain, redness, visual disturbance and dryness.  Respiratory: Negative for cough, shortness of breath and difficulty breathing.   Cardiovascular: Positive for hypertension. Negative for chest pain, palpitations, irregular heartbeat and swelling in legs/feet.  Gastrointestinal: Negative for blood in stool, constipation and diarrhea.  Endocrine: Negative for increased urination.  Genitourinary: Negative for vaginal dryness.  Musculoskeletal: Positive for  arthralgias, joint pain, joint swelling and morning stiffness. Negative for myalgias, muscle weakness, muscle tenderness and myalgias.  Skin: Negative for color change, rash, hair loss, skin tightness, ulcers and sensitivity to sunlight.  Allergic/Immunologic: Negative for susceptible to infections.  Neurological: Negative for dizziness, memory loss and night sweats.  Hematological: Negative for swollen glands.  Psychiatric/Behavioral: Negative for depressed mood and sleep disturbance. The patient is not nervous/anxious.     PMFS History:  Patient Active Problem List   Diagnosis Date Noted  . Malignant neoplasm of upper-inner quadrant of left breast in female, estrogen receptor positive ([place]) 08/26/2016  . High risk medication use 06/08/2016  . Age-related osteoporosis without current pathological fracture 06/08/2016  . ANA positive 06/08/2016  . Moderate persistent asthma 06/08/2016  . Normocytic anemia 10/01/2014  . Rheumatoid arthritis of multiple sites without organ or system involvement with positive rheumatoid factor ([place]) 10/01/2014  . Hip fracture ([place]) 10/01/2014  . Subcapital fracture of left hip ([place]) 10/01/2014  . Closed left hip fracture ([place]) 09/30/2014    Past Medical History:  Diagnosis Date  . Anemia   . Arthritis    rheumatoid +CCP neg RF +ANA synovitis   . Asthma   . Cancer [facility])    Breast cancer  . Glaucoma    right eye  . Osteoporosis     Family History  Problem Relation Age of Onset  . Hypertension Father    Past Surgical History:  Procedure Laterality Date  . BREAST LUMPECTOMY WITH RADIOACTIVE SEED AND SENTINEL LYMPH NODE BIOPSY Left 07/09/2016   Procedure: BREAST LUMPECTOMY WITH  RADIOACTIVE SEED AND SENTINEL LYMPH NODE BIOPSY;  Surgeon: Coralie Keens, MD;  Location: [place];  Service: General;  Laterality: Left;  . NO PAST SURGERIES    . TOTAL HIP ARTHROPLASTY Left 10/01/2014   Procedure: TOTAL HIP ARTHROPLASTY;  Surgeon: Garald Balding, MD;   Location: [place];  Service: Orthopedics;  Laterality: Left;   Social History   Social History Narrative  . No narrative on file     Objective: Vital Signs: BP (!) 151/81   Pulse (!) 105   Resp 16   Ht 5\' 1"  (1.549 m)   Wt 137 lb (62.1 kg)   BMI 25.89 kg/m    Physical Exam  Constitutional: She is oriented to person, place, and time. She appears well-developed and well-nourished.  HENT:  Head: Normocephalic and atraumatic.  Eyes: Conjunctivae and EOM are normal.  Neck: Normal range of motion.  Cardiovascular: Normal rate, regular rhythm, normal heart sounds and intact distal pulses.   Pulmonary/Chest: Effort normal and breath sounds normal.  Abdominal: Soft. Bowel sounds are normal.  Lymphadenopathy:    She has no cervical adenopathy.  Neurological: She is alert and oriented to person, place, and time.  Skin: Skin is warm and dry. Capillary refill takes less than 2 seconds.  Psychiatric: She has a normal mood and affect. Her behavior is normal.  Nursing note and vitals reviewed.    Musculoskeletal Exam: C-spine, thoracic, lumbar spine good range of motion. She had discomfort with range of motion of her right shoulder joint. Elbow joints wrist joint MCPs PIPs DIPs with good range of motion with no synovitis she had mild extensor tenosynovitis in the left wrist. She tenderness over bilateral CMC joints. Hip joints knee joints ankles MTPs PIPs with good range of motion with no synovitis. She had tenderness on palpation over right trochanteric bursa consistent with trochanteric bursitis.  CDAI Exam: CDAI Homunculus Exam:   Tenderness:  LUE: wrist  Joint Counts:  CDAI Tender Joint count: 1 CDAI Swollen Joint count: 0  Global Assessments:  Patient Global Assessment: 6 Provider Global Assessment: 6  CDAI Calculated Score: 13    Investigation: No additional findings. CBC    Component Value Date/Time   WBC 7.2 08/23/2016 0951   WBC 8.0 07/05/2016 1307   RBC 3.90  08/23/2016 0951   RBC 3.98 07/05/2016 1307   HGB 10.6 (L) 08/23/2016 0951   HCT 33.0 (L) 08/23/2016 0951   PLT 296 08/23/2016 0951   MCV 84.6 08/23/2016 0951   MCH 27.2 08/23/2016 0951   MCH 27.4 07/05/2016 1307   MCHC 32.1 08/23/2016 0951   MCHC 32.2 07/05/2016 1307   RDW 15.8 (H) 08/23/2016 0951   LYMPHSABS 2.4 08/23/2016 0951   MONOABS 0.9 08/23/2016 0951   EOSABS 0.2 08/23/2016 0951   BASOSABS 0.1 08/23/2016 0951   CMP     Component Value Date/Time   NA 140 08/23/2016 0951   K 3.9 08/23/2016 0951   CL 97 (L) 07/05/2016 1307   CO2 29 08/23/2016 0951   GLUCOSE 81 08/23/2016 0951   BUN 9.7 08/23/2016 0951   CREATININE 0.7 08/23/2016 0951   CALCIUM 10.0 08/23/2016 0951   PROT 7.4 08/23/2016 0951   ALBUMIN 3.5 08/23/2016 0951   AST 27 08/23/2016 0951   ALT 27 08/23/2016 0951   ALKPHOS 111 08/23/2016 0951   BILITOT 0.31 08/23/2016 0951   GFRNONAA 86 07/05/2016 1307   GFRAA >89 07/05/2016 1307    Imaging: No results found.  Speciality Comments: No  specialty comments available.    Procedures:  No procedures performed Allergies: Penicillins and Sulfa antibiotics   Assessment / Plan:     Visit Diagnoses: Rheumatoid arthritis of multiple sites without organ or system involvement with positive rheumatoid factor (HCC) - RF negative, CCP positive, ANA positive. She has mild tenosynovitis on examination she has no active synovitis currently. Although she's been experiencing increased joint pain. Some of the discomfort is due to underlying osteoarthritis.  High risk medication use - Arava 20 mg by mouth daily, prednisone 5 mg by mouth daily, Plaquenil 200 mg twice a day Monday to Friday, Orencia IV discontinued due to diagnosis of breast cancer her most recent labs were normal she should have labs every 3 months. We will continue current treatment for right now.  Trochanteric bursitis of right hip: ITB and exercise were demonstrated and handout was given.. Lower extremity  muscle strengthening exercise was also discussed and demonstrated.  Age-related osteoporosis without current pathological fracture - On Fosamax, DEXA June 2016 T score -4.3 left radius. Patient states she has a DEXA coming up.  Normocytic anemia  History of breast cancer  Closed subcapital fracture of left femur, sequela  Moderate persistent asthma, unspecified whether complicated     Orders: No orders of the defined types were placed in this encounter.  No orders of the defined types were placed in this encounter.   Face-to-face time spent with patient was 30 minutes. 50% of time was spent in counseling and coordination of care.  Follow-Up Instructions: Return in about 5 months (around 02/08/2017) for Rheumatoid arthritis.   Bo Merino, MD  Note - This record has been created using Editor, commissioning.  Chart creation errors have been sought, but may not always  have been located. Such creation errors do not reflect on  the standard of medical care.

## 2016-09-06 ENCOUNTER — Ambulatory Visit
Admission: RE | Admit: 2016-09-06 | Discharge: 2016-09-06 | Disposition: A | Discharge status: Home / Self Care | Payer: Medicare Other | Source: Ambulatory Visit | Attending: Radiation Oncology | Admitting: Radiation Oncology

## 2016-09-06 DIAGNOSIS — Z510 Encounter for antineoplastic radiation therapy: Secondary | ICD-10-CM | POA: Diagnosis not present

## 2016-09-07 ENCOUNTER — Ambulatory Visit
Admission: RE | Admit: 2016-09-07 | Discharge: 2016-09-07 | Disposition: A | Discharge status: Home / Self Care | Payer: Medicare Other | Source: Ambulatory Visit | Attending: Radiation Oncology | Admitting: Radiation Oncology

## 2016-09-07 ENCOUNTER — Ambulatory Visit: Payer: Medicare Other | Admitting: Rheumatology

## 2016-09-07 DIAGNOSIS — Z510 Encounter for antineoplastic radiation therapy: Secondary | ICD-10-CM | POA: Diagnosis not present

## 2016-09-07 DIAGNOSIS — Z170 Estrogen receptor positive status [ER+]: Secondary | ICD-10-CM

## 2016-09-07 DIAGNOSIS — C50212 Malignant neoplasm of upper-inner quadrant of left female breast: Secondary | ICD-10-CM

## 2016-09-07 MED ORDER — RADIAPLEXRX EX GEL
Freq: Once | CUTANEOUS | Status: AC
  Administered 2016-09-07: 12:00:00 via TOPICAL

## 2016-09-07 MED ORDER — ALRA NON-METALLIC DEODORANT (RAD-ONC)
1.0000 | Freq: Once | TOPICAL | Status: AC
Start: 2016-09-07 — End: 2016-09-07
  Administered 2016-09-07: 1 via TOPICAL

## 2016-09-07 NOTE — Progress Notes (Signed)
Pt education done, radiation therapy and you ,my business card, radiaplex cream and alra deodorant, discussed skin irritation,fatigue, breast swelling,sharp pains in breast, do get some exercise, luke warm showers, electric razor only, no rubbing scrubbing or scratching breast, increase protein nd hydration in diet, no under wire bras, dove soap unscented preferred, teach back given, skin products to be used after radiation daily and again bedtime 12:41 PM

## 2016-09-08 ENCOUNTER — Ambulatory Visit (INDEPENDENT_AMBULATORY_CARE_PROVIDER_SITE_OTHER): Payer: Medicare Other | Admitting: Rheumatology

## 2016-09-08 ENCOUNTER — Ambulatory Visit
Admission: RE | Admit: 2016-09-08 | Discharge: 2016-09-08 | Disposition: A | Discharge status: Home / Self Care | Payer: Medicare Other | Source: Ambulatory Visit | Attending: Radiation Oncology | Admitting: Radiation Oncology

## 2016-09-08 ENCOUNTER — Encounter: Payer: Self-pay | Admitting: Rheumatology

## 2016-09-08 VITALS — BP 151/81 | HR 105 | Resp 16 | Ht 61.0 in | Wt 137.0 lb

## 2016-09-08 DIAGNOSIS — Z853 Personal history of malignant neoplasm of breast: Secondary | ICD-10-CM

## 2016-09-08 DIAGNOSIS — D649 Anemia, unspecified: Secondary | ICD-10-CM

## 2016-09-08 DIAGNOSIS — Z79899 Other long term (current) drug therapy: Secondary | ICD-10-CM

## 2016-09-08 DIAGNOSIS — S72012S Unspecified intracapsular fracture of left femur, sequela: Secondary | ICD-10-CM

## 2016-09-08 DIAGNOSIS — M0579 Rheumatoid arthritis with rheumatoid factor of multiple sites without organ or systems involvement: Secondary | ICD-10-CM | POA: Diagnosis not present

## 2016-09-08 DIAGNOSIS — M810 Age-related osteoporosis without current pathological fracture: Secondary | ICD-10-CM | POA: Diagnosis not present

## 2016-09-08 DIAGNOSIS — Z510 Encounter for antineoplastic radiation therapy: Secondary | ICD-10-CM | POA: Diagnosis not present

## 2016-09-08 DIAGNOSIS — M7061 Trochanteric bursitis, right hip: Secondary | ICD-10-CM

## 2016-09-08 DIAGNOSIS — J4540 Moderate persistent asthma, uncomplicated: Secondary | ICD-10-CM

## 2016-09-08 NOTE — Patient Instructions (Addendum)
Iliotibial Bursitis Rehab Ask your health care provider which exercises are safe for you. Do exercises exactly as told by your health care provider and adjust them as directed. It is normal to feel mild stretching, pulling, tightness, or discomfort as you do these exercises, but you should stop right away if you feel sudden pain or your pain gets worse.Do not begin these exercises until told by your health care provider. Stretching and range of motion exercises These exercises warm up your muscles and joints and improve the movement and flexibility of your leg. These exercises also help to relieve pain and stiffness. Exercise A: Quadriceps stretch, prone   1. Lie on your abdomen on a firm surface, such as a bed or padded floor. 2. Bend your left / right knee and hold your ankle. If you cannot reach your ankle or pant leg, loop a belt around your foot and grab the belt instead. 3. Gently pull your heel toward your buttocks. Your knee should not slide out to the side. You should feel a stretch in the front of your thigh and knee. 4. Hold this position for __________ seconds. Repeat __________ times. Complete this exercise __________ times a day. Exercise B: Lunge (  adductor stretch) 1. Stand and spread your legs about 3 feet (about 1 m) apart. Put your left / right leg slightly back for balance. 2. Lean away from your left / right leg by bending your other knee and shifting your weight toward your bent knee. You may rest your hands on your thigh for balance. You should feel a stretch in your left / right inner thigh. 3. Hold for __________ seconds. Repeat __________ times. Complete this exercise __________ times a day. Exercise C: Hamstring stretch, supine  1. Lie on your back. 2. Hold both ends of a belt or towel as you loop it over the ball of your left / right foot. The ball of your foot is on the walking surface, right under your toes. 3. Straighten your left / right knee and slowly pull on  the belt to raise your leg. Stop when you feel a gentle stretch in the back of your left / right knee or thigh.  Do not let your left / right knee bend.  Keep your other leg flat on the floor. 4. Hold this position for __________ seconds. Repeat __________ times. Complete this exercise __________ times a day. Strengthening exercises These exercises build strength and endurance in your leg. Endurance is the ability to use your muscles for a long time, even after they get tired. Exercise D: Quadriceps wall slides  1. Lean your back against a smooth wall or door while you walk your feet out 18-24 inches (46-61 cm) from it. 2. Place your feet hip-width apart. 3. Slowly slide down the wall or door until your knees bend as far as told by your health care provider. Keep your knees over your heels, not your toes. Keep your knees in line with your hips. 4. Hold for __________ seconds. 5. Push through your heels to stand up to rest for __________ seconds after each repetition. Repeat __________ times. Complete this exercise __________ times a day. Exercise E: Straight leg raises ( hip abductors) 1. Lie on your side, with your left / right leg in the top position. Lie so your head, shoulder, knee, and hip line up with each other. You may bend your bottom knee to help you balance. 2. Lift your top leg 4-6 inches (10-15 cm) while   keeping your toes pointed straight ahead. 3. Hold this position for __________ seconds. 4. Slowly lower your leg to the starting position. Allow your muscles to relax completely after each repetition. Repeat __________ times. Complete this exercise __________ times a day. Exercise F: Straight leg raises ( hip extensors) 1. Lie on your abdomen on a firm surface. You can put a pillow under your hips if that is more comfortable. 2. Tense the muscles in your buttocks and lift your left / right leg about 4-6 inches (10-15 cm). Keep your knee straight as you lift your leg. 3. Hold  this position for __________ seconds. 4. Slowly lower your leg to the starting position. 5. Let your leg relax completely after each repetition. Repeat __________ times. Complete this exercise __________ times a day. Exercise G: Bridge ( hip extensors) 1. Lie on your back on a firm surface with your knees bent and your feet flat on the floor. 2. Tighten your buttocks muscles and lift your bottom off the floor until your trunk is level with your thighs.  Do not arch your back.  You should feel the muscles working in your buttocks and the back of your thighs. If you do not feel these muscles, slide your feet 1-2 inches (2.5-5 cm) farther away from your buttocks. 3. Hold this position for __________ seconds. 4. Slowly lower your hips to the starting position. 5. Let your buttocks muscles relax completely between repetitions. 6. If this exercise is too easy, try doing it with your arms crossed over your chest. Repeat __________ times. Complete this exercise __________ times a day. This information is not intended to replace advice given to you by your health care provider. Make sure you discuss any questions you have with your health care provider. Document Released: 05/17/2005 Document Revised: 01/22/2016 Document Reviewed: 04/29/2015 Elsevier Interactive Patient Education  2017 [place] We placed an order today for your standing lab work.    Please come back and get your standing labs in June and every 3 months  We have open lab Monday through Friday from 8:30-11:30 AM and 1:30-4 PM at the office of Dr. Tresa Moore, PA.   The office is located at [address] [place], [place], [place] [address] No appointment is necessary.   Labs are drawn by [facility].  You may receive a bill from [facility] for your lab work.

## 2016-09-09 ENCOUNTER — Ambulatory Visit
Admission: RE | Admit: 2016-09-09 | Discharge: 2016-09-09 | Disposition: A | Discharge status: Home / Self Care | Payer: Medicare Other | Source: Ambulatory Visit | Attending: Radiation Oncology | Admitting: Radiation Oncology

## 2016-09-09 DIAGNOSIS — Z510 Encounter for antineoplastic radiation therapy: Secondary | ICD-10-CM | POA: Diagnosis not present

## 2016-09-10 ENCOUNTER — Ambulatory Visit
Admission: RE | Admit: 2016-09-10 | Discharge: 2016-09-10 | Disposition: A | Discharge status: Home / Self Care | Payer: Medicare Other | Source: Ambulatory Visit | Attending: Radiation Oncology | Admitting: Radiation Oncology

## 2016-09-10 DIAGNOSIS — Z510 Encounter for antineoplastic radiation therapy: Secondary | ICD-10-CM | POA: Diagnosis not present

## 2016-09-13 ENCOUNTER — Other Ambulatory Visit: Payer: Self-pay | Admitting: Rheumatology

## 2016-09-13 ENCOUNTER — Ambulatory Visit
Admission: RE | Admit: 2016-09-13 | Discharge: 2016-09-13 | Disposition: A | Discharge status: Home / Self Care | Payer: Medicare Other | Source: Ambulatory Visit | Attending: Radiation Oncology | Admitting: Radiation Oncology

## 2016-09-13 DIAGNOSIS — Z510 Encounter for antineoplastic radiation therapy: Secondary | ICD-10-CM | POA: Diagnosis not present

## 2016-09-13 MED ORDER — LEFLUNOMIDE 20 MG PO TABS: 20.0000 mg | ORAL_TABLET | Freq: Every day | ORAL | 0 refills | Status: DC

## 2016-09-13 NOTE — Telephone Encounter (Signed)
Last Visit: 09/08/16 Next Visit: 02/09/17 Labs: 07/05/16 1: CMP with GFR is within normal limits (We will monitor liver function on next labs) #2: CBC with differential mild/moderate anemia. We will monitor.  Okay to refill Arava?

## 2016-09-13 NOTE — Telephone Encounter (Signed)
Patient called requesting a refill on her [place].  She uses [facility] on [address] in [place].  CB#[phone].  Thank you.

## 2016-09-14 ENCOUNTER — Ambulatory Visit
Admission: RE | Admit: 2016-09-14 | Discharge: 2016-09-14 | Disposition: A | Discharge status: Home / Self Care | Payer: Medicare Other | Source: Ambulatory Visit | Attending: Radiation Oncology | Admitting: Radiation Oncology

## 2016-09-14 DIAGNOSIS — Z510 Encounter for antineoplastic radiation therapy: Secondary | ICD-10-CM | POA: Diagnosis not present

## 2016-09-15 ENCOUNTER — Ambulatory Visit
Admission: RE | Admit: 2016-09-15 | Discharge: 2016-09-15 | Disposition: A | Discharge status: Home / Self Care | Payer: Medicare Other | Source: Ambulatory Visit | Attending: Radiation Oncology | Admitting: Radiation Oncology

## 2016-09-15 DIAGNOSIS — Z510 Encounter for antineoplastic radiation therapy: Secondary | ICD-10-CM | POA: Diagnosis not present

## 2016-09-16 ENCOUNTER — Ambulatory Visit: Payer: Medicare Other

## 2016-09-16 ENCOUNTER — Ambulatory Visit: Payer: Medicare Other | Admitting: Radiation Oncology

## 2016-09-17 ENCOUNTER — Ambulatory Visit
Admission: RE | Admit: 2016-09-17 | Discharge: 2016-09-17 | Disposition: A | Discharge status: Home / Self Care | Payer: Medicare Other | Source: Ambulatory Visit | Attending: Radiation Oncology | Admitting: Radiation Oncology

## 2016-09-17 DIAGNOSIS — Z510 Encounter for antineoplastic radiation therapy: Secondary | ICD-10-CM | POA: Diagnosis not present

## 2016-09-20 ENCOUNTER — Ambulatory Visit
Admission: RE | Admit: 2016-09-20 | Discharge: 2016-09-20 | Disposition: A | Discharge status: Home / Self Care | Payer: Medicare Other | Source: Ambulatory Visit | Attending: Radiation Oncology | Admitting: Radiation Oncology

## 2016-09-20 DIAGNOSIS — Z510 Encounter for antineoplastic radiation therapy: Secondary | ICD-10-CM | POA: Diagnosis not present

## 2016-09-21 ENCOUNTER — Ambulatory Visit
Admission: RE | Admit: 2016-09-21 | Discharge: 2016-09-21 | Disposition: A | Discharge status: Home / Self Care | Payer: Medicare Other | Source: Ambulatory Visit | Attending: Radiation Oncology | Admitting: Radiation Oncology

## 2016-09-21 DIAGNOSIS — Z510 Encounter for antineoplastic radiation therapy: Secondary | ICD-10-CM | POA: Diagnosis not present

## 2016-09-22 ENCOUNTER — Ambulatory Visit
Admission: RE | Admit: 2016-09-22 | Discharge: 2016-09-22 | Disposition: A | Discharge status: Home / Self Care | Payer: Medicare Other | Source: Ambulatory Visit | Attending: Radiation Oncology | Admitting: Radiation Oncology

## 2016-09-22 DIAGNOSIS — Z510 Encounter for antineoplastic radiation therapy: Secondary | ICD-10-CM | POA: Diagnosis not present

## 2016-09-23 ENCOUNTER — Ambulatory Visit
Admission: RE | Admit: 2016-09-23 | Discharge: 2016-09-23 | Disposition: A | Discharge status: Home / Self Care | Payer: Medicare Other | Source: Ambulatory Visit | Attending: Radiation Oncology | Admitting: Radiation Oncology

## 2016-09-23 DIAGNOSIS — Z510 Encounter for antineoplastic radiation therapy: Secondary | ICD-10-CM | POA: Diagnosis not present

## 2016-09-24 ENCOUNTER — Ambulatory Visit: Payer: Medicare Other

## 2016-09-24 ENCOUNTER — Ambulatory Visit
Admission: RE | Admit: 2016-09-24 | Discharge: 2016-09-24 | Disposition: A | Discharge status: Home / Self Care | Payer: Medicare Other | Source: Ambulatory Visit | Attending: Radiation Oncology | Admitting: Radiation Oncology

## 2016-09-24 DIAGNOSIS — Z510 Encounter for antineoplastic radiation therapy: Secondary | ICD-10-CM | POA: Diagnosis not present

## 2016-09-27 ENCOUNTER — Ambulatory Visit: Payer: Medicare Other

## 2016-09-27 ENCOUNTER — Ambulatory Visit
Admission: RE | Admit: 2016-09-27 | Discharge: 2016-09-27 | Disposition: A | Discharge status: Home / Self Care | Payer: Medicare Other | Source: Ambulatory Visit | Attending: Hematology | Admitting: Hematology

## 2016-09-27 ENCOUNTER — Other Ambulatory Visit: Payer: Medicare Other

## 2016-09-27 ENCOUNTER — Ambulatory Visit
Admission: RE | Admit: 2016-09-27 | Discharge: 2016-09-27 | Disposition: A | Discharge status: Home / Self Care | Payer: Medicare Other | Source: Ambulatory Visit | Attending: Radiation Oncology | Admitting: Radiation Oncology

## 2016-09-27 DIAGNOSIS — Z170 Estrogen receptor positive status [ER+]: Secondary | ICD-10-CM

## 2016-09-27 DIAGNOSIS — Z510 Encounter for antineoplastic radiation therapy: Secondary | ICD-10-CM | POA: Diagnosis not present

## 2016-09-27 DIAGNOSIS — M810 Age-related osteoporosis without current pathological fracture: Secondary | ICD-10-CM

## 2016-09-27 DIAGNOSIS — C50312 Malignant neoplasm of lower-inner quadrant of left female breast: Secondary | ICD-10-CM

## 2016-09-28 ENCOUNTER — Ambulatory Visit: Payer: Medicare Other

## 2016-09-28 ENCOUNTER — Ambulatory Visit
Admission: RE | Admit: 2016-09-28 | Discharge: 2016-09-28 | Disposition: A | Discharge status: Home / Self Care | Payer: Medicare Other | Source: Ambulatory Visit | Attending: Radiation Oncology | Admitting: Radiation Oncology

## 2016-09-28 DIAGNOSIS — Z510 Encounter for antineoplastic radiation therapy: Secondary | ICD-10-CM | POA: Diagnosis not present

## 2016-09-28 DIAGNOSIS — Z923 Personal history of irradiation: Secondary | ICD-10-CM

## 2016-09-28 HISTORY — DX: Personal history of irradiation: Z92.3

## 2016-09-29 ENCOUNTER — Ambulatory Visit
Admission: RE | Admit: 2016-09-29 | Discharge: 2016-09-29 | Disposition: A | Discharge status: Home / Self Care | Payer: Medicare Other | Source: Ambulatory Visit | Attending: Radiation Oncology | Admitting: Radiation Oncology

## 2016-09-29 ENCOUNTER — Ambulatory Visit: Payer: Medicare Other

## 2016-09-29 DIAGNOSIS — Z510 Encounter for antineoplastic radiation therapy: Secondary | ICD-10-CM | POA: Diagnosis not present

## 2016-09-30 ENCOUNTER — Ambulatory Visit: Payer: Medicare Other

## 2016-10-01 ENCOUNTER — Ambulatory Visit: Payer: Medicare Other

## 2016-10-01 ENCOUNTER — Ambulatory Visit
Admission: RE | Admit: 2016-10-01 | Discharge: 2016-10-01 | Disposition: A | Discharge status: Home / Self Care | Payer: Medicare Other | Source: Ambulatory Visit | Attending: Radiation Oncology | Admitting: Radiation Oncology

## 2016-10-01 DIAGNOSIS — Z510 Encounter for antineoplastic radiation therapy: Secondary | ICD-10-CM | POA: Diagnosis not present

## 2016-10-04 ENCOUNTER — Other Ambulatory Visit (HOSPITAL_BASED_OUTPATIENT_CLINIC_OR_DEPARTMENT_OTHER): Payer: Medicare Other

## 2016-10-04 ENCOUNTER — Ambulatory Visit (HOSPITAL_BASED_OUTPATIENT_CLINIC_OR_DEPARTMENT_OTHER): Payer: Medicare Other | Admitting: Hematology

## 2016-10-04 ENCOUNTER — Telehealth: Payer: Self-pay | Admitting: Hematology

## 2016-10-04 ENCOUNTER — Ambulatory Visit
Admission: RE | Admit: 2016-10-04 | Discharge: 2016-10-04 | Disposition: A | Discharge status: Home / Self Care | Payer: Medicare Other | Source: Ambulatory Visit | Attending: Radiation Oncology | Admitting: Radiation Oncology

## 2016-10-04 ENCOUNTER — Ambulatory Visit: Payer: Medicare Other

## 2016-10-04 ENCOUNTER — Encounter: Payer: Self-pay | Admitting: Hematology

## 2016-10-04 VITALS — BP 149/76 | HR 92 | Temp 98.3°F | Resp 18 | Ht 61.0 in | Wt 135.9 lb

## 2016-10-04 DIAGNOSIS — Z170 Estrogen receptor positive status [ER+]: Secondary | ICD-10-CM

## 2016-10-04 DIAGNOSIS — C50912 Malignant neoplasm of unspecified site of left female breast: Secondary | ICD-10-CM

## 2016-10-04 DIAGNOSIS — M810 Age-related osteoporosis without current pathological fracture: Secondary | ICD-10-CM | POA: Insufficient documentation

## 2016-10-04 DIAGNOSIS — C50312 Malignant neoplasm of lower-inner quadrant of left female breast: Secondary | ICD-10-CM

## 2016-10-04 DIAGNOSIS — Z510 Encounter for antineoplastic radiation therapy: Secondary | ICD-10-CM | POA: Diagnosis not present

## 2016-10-04 LAB — CBC & DIFF AND RETIC
BASO%: 0.6 % (ref 0.0–2.0)
Basophils Absolute: 0 10*3/uL (ref 0.0–0.1)
EOS%: 3.8 % (ref 0.0–7.0)
Eosinophils Absolute: 0.3 10*3/uL (ref 0.0–0.5)
HCT: 32.4 % — ABNORMAL LOW (ref 34.8–46.6)
HGB: 10.3 g/dL — ABNORMAL LOW (ref 11.6–15.9)
IMMATURE RETIC FRACT: 9.8 % (ref 1.60–10.00)
LYMPH#: 1.6 10*3/uL (ref 0.9–3.3)
LYMPH%: 22.9 % (ref 14.0–49.7)
MCH: 27 pg (ref 25.1–34.0)
MCHC: 31.8 g/dL (ref 31.5–36.0)
MCV: 85 fL (ref 79.5–101.0)
MONO#: 0.7 10*3/uL (ref 0.1–0.9)
MONO%: 10.9 % (ref 0.0–14.0)
NEUT#: 4.2 10*3/uL (ref 1.5–6.5)
NEUT%: 61.8 % (ref 38.4–76.8)
Platelets: 359 10*3/uL (ref 145–400)
RBC: 3.81 10*6/uL (ref 3.70–5.45)
RDW: 15.5 % — ABNORMAL HIGH (ref 11.2–14.5)
RETIC %: 1.26 % (ref 0.70–2.10)
RETIC CT ABS: 48.01 10*3/uL (ref 33.70–90.70)
WBC: 6.8 10*3/uL (ref 3.9–10.3)

## 2016-10-04 LAB — COMPREHENSIVE METABOLIC PANEL
ALT: 37 U/L (ref 0–55)
ANION GAP: 10 meq/L (ref 3–11)
AST: 31 U/L (ref 5–34)
Albumin: 3.2 g/dL — ABNORMAL LOW (ref 3.5–5.0)
Alkaline Phosphatase: 103 U/L (ref 40–150)
BUN: 12.8 mg/dL (ref 7.0–26.0)
CHLORIDE: 100 meq/L (ref 98–109)
CO2: 30 mEq/L — ABNORMAL HIGH (ref 22–29)
Calcium: 9.9 mg/dL (ref 8.4–10.4)
Creatinine: 0.7 mg/dL (ref 0.6–1.1)
EGFR: 85 mL/min/{1.73_m2} — AB (ref >90)
Glucose: 89 mg/dl (ref 70–140)
POTASSIUM: 4 meq/L (ref 3.5–5.1)
SODIUM: 139 meq/L (ref 136–145)
TOTAL PROTEIN: 7.2 g/dL (ref 6.4–8.3)
Total Bilirubin: 0.24 mg/dL (ref 0.20–1.20)

## 2016-10-04 NOTE — Telephone Encounter (Signed)
Gave patient AVS and scheduled appts per 5/7 los.

## 2016-10-04 NOTE — Patient Instructions (Signed)
Thank you for choosing [facility] to provide your oncology and hematology care.  To afford each patient quality time with our providers, please arrive 30 minutes before your scheduled appointment time.  If you arrive late for your appointment, you may be asked to reschedule.  We strive to give you quality time with our providers, and arriving late affects you and other patients whose appointments are after yours.  If you are a no show for multiple scheduled visits, you may be dismissed from the clinic at the providers discretion.   Again, thank you for choosing [facility], our hope is that these requests will decrease the amount of time that you wait before being seen by our physicians.  ______________________________________________________________________ Should you have questions after your visit to the [facility], please contact our office at [phone] between the hours of 8:30 and 4:30 p.m.    Voicemails left after 4:30p.m will not be returned until the following business day.   For prescription refill requests, please have your pharmacy contact us directly.  Please also try to allow 48 hours for prescription requests.   Please contact the scheduling department for questions regarding scheduling.  For scheduling of procedures such as PET scans, CT scans, MRI, Ultrasound, etc please contact central scheduling at [phone].   Resources For Cancer Patients and Caregivers:  American Cancer Society:  800-227-2345  Can help patients locate various types of support and financial assistance Cancer Care: 1-800-813-HOPE (4673) Provides financial assistance, online support groups, medication/co-pay assistance.   [place] DSS:  336-641-3447 Where to apply for food stamps, Medicaid, and utility assistance Medicare Rights Center: 800-333-4114 Helps people with Medicare understand their rights and benefits, navigate the Medicare system, and secure the  quality healthcare they deserve SCAT: 336-333-6589 [place] [employer]'s shared-ride transportation service for eligible riders who have a disability that prevents them from riding the fixed route bus.   For additional information on assistance programs please contact our social worker:   Grier Hock/Abigail Elmore:  [phone] 

## 2016-10-04 NOTE — Progress Notes (Signed)
[name]  HEMATOLOGY ONCOLOGY CLINIC NOTE  Date of service: .10/04/2016  Patient Care Team: Vernie Shanks, MD as PCP - General (Family Medicine)  Chief Complaint: f/u for management of hormone positive Her 2 neg breast cancer  Diagnosis  Stage I (pT1b, pN0, Mx) left sided invasive ductal carcinoma of the breast. 1 cm in size with lymphovascular invasion grade 2/3, Ki-67 15% ER/PR positive HER-2/neu negative. One Sentinel lymph node biopsy negative No DCIS. Severe calcified benign lesions in left breast with PASH (pseudoangiomatous stromal hyperplasia).  Patient is status post left breast lumpectomy by Dr. Ninfa Linden on 07/09/2016.   Patient is completing adjuvant  Left breat RT on 10/05/2016   INTERVAL HISTORY:  Felicia Gonzalez is here for her scheduled follow-up for stage I left breast cancer status post surgery and currently in the process of completing left breast radiation tomorrow on 10/05/2016.  She is here for discussion of adjuvant endocrine treatment options.  Her bone density scan shows significant osteoporosis despite being on Fosamax for about 2 years. She had an osteoporotic left hip fracture in May 2016. Her vitamin D levels checked in March were in the 60s. She has remained fairly physically active. Still continues to be on chronic prednisone therapy at 5 mg by mouth daily by rheumatology.  We discussed that the typical adjuvant endocrine treatment option in postmenopausal hormone positive breast cancer is an aromatase inhibitor. The concerns in her case are that she has severe osteoporosis despite being on adequate doses of calcium and vitamin D and Fosamax x 88yr and the fact that she previously has had an osteoporotic left hip fracture and the fact that she continues to be on chronic steroids for her rheumatoid arthritis.  -We discussed that aromatase inhibitors would likely worsen her osteoporosis and could increase her risk of additional     osteoporotic fractures. - We  recommended that we switch her osteoporosis treatment from Fosamax to Prolia and after discussing pros and    Cons she is okay with starting on Prolia.  We discussed the pros and cons of using tamoxifen including increased risk of venous thromboembolism in the setting of chronic intermittent disorder and her age as well as an increase risk of uterine cancer which will need to be monitored.  We discussed the option of possibly using Raloxifen which might have some increased risk of venous thromboembolism but would be protective for her bones but might be somewhat less effective from a breast cancer risk reduction standpoint.  She was recommended to start taking baby aspirin to see if she tolerates this since she has had issues with nausea with ASA in the past.    REVIEW OF SYSTEMS:    10 Point review of systems of done and is negative except as noted above.  . Past Medical History:  Diagnosis Date  . Anemia   . Arthritis    rheumatoid +CCP neg RF +ANA synovitis   . Asthma   . Cancer ([facility]    Breast cancer  . Glaucoma    right eye  . Osteoporosis      . Past Surgical History:  Procedure Laterality Date  . BREAST LUMPECTOMY WITH RADIOACTIVE SEED AND SENTINEL LYMPH NODE BIOPSY Left 07/09/2016   Procedure: BREAST LUMPECTOMY WITH RADIOACTIVE SEED AND SENTINEL LYMPH NODE BIOPSY;  Surgeon: DCoralie Keens MD;  [place]  Service: General;  Laterality: Left;  . NO PAST SURGERIES    . TOTAL HIP ARTHROPLASTY Left 10/01/2014   Procedure: TOTAL HIP ARTHROPLASTY;  Surgeon: Garald Balding, MD;  Location: [place];  Service: Orthopedics;  Laterality: Left;    . Social History  Substance Use Topics  . Smoking status: Never Smoker  . Smokeless tobacco: Never Used  . Alcohol use No    ALLERGIES:  is allergic to penicillins and sulfa antibiotics.  MEDICATIONS:  Current Outpatient Prescriptions  Medication Sig Dispense Refill  . acetaminophen (TYLENOL) 650 MG CR tablet Take 1,300  mg by mouth 2 (two) times daily as needed for pain. Take one hour prior to infusion     . albuterol (PROVENTIL HFA;VENTOLIN HFA) 108 (90 Base) MCG/ACT inhaler Inhale 1-2 puffs into the lungs every 6 (six) hours as needed for wheezing or shortness of breath.    [name] alendronate (FOSAMAX) 70 MG tablet Take 1 tablet (70 mg total) by mouth once a week. Take with a full glass of water on an empty stomach. 12 tablet 0  . amLODipine (NORVASC) 2.5 MG tablet Take 2.5 mg by mouth daily.    . Cholecalciferol (VITAMIN D3) 5000 units TABS Take 5,000 Units by mouth daily after breakfast.    . hyaluronate sodium (RADIAPLEXRX) GEL Apply 1 application topically 2 (two) times daily.    . hydroxychloroquine (PLAQUENIL) 200 MG tablet Take 1 tablet by mouth BID Monday thru Friday 120 tablet 1  . leflunomide (ARAVA) 20 MG tablet Take 1 tablet (20 mg total) by mouth daily. 90 tablet 0  . Multiple Vitamin (MULTIVITAMIN WITH MINERALS) TABS tablet Take 1 tablet by mouth daily.    . non-metallic deodorant Jethro Poling) MISC Apply 1 application topically daily as needed.    . Omega-3 Fatty Acids (OMEGA-3 FISH OIL) 1200 MG CAPS Take 1,200 mg by mouth 2 (two) times daily.    Vladimir Faster Glycol-Propyl Glycol (SYSTANE OP) Place 1 drop into the left eye at bedtime.    . predniSONE (DELTASONE) 5 MG tablet Take 1 tablet (5 mg total) by mouth daily with breakfast. 90 tablet 1  . saccharomyces boulardii (FLORASTOR) 250 MG capsule Take 250 mg by mouth 2 (two) times daily.    . traMADol (ULTRAM) 50 MG tablet Take 1-2 tablets (50-100 mg total) by mouth every 6 (six) hours as needed. 20 tablet 0  . TRAVATAN Z 0.004 % SOLN ophthalmic solution Place 1 drop into the right eye at bedtime.    . TURMERIC PO Take 1 capsule by mouth daily after breakfast.     No current facility-administered medications for this visit.     PHYSICAL EXAMINATION: ECOG PERFORMANCE STATUS: 1 - Symptomatic but completely ambulatory  . There were no vitals filed for this  visit.  There were no vitals filed for this visit. .There is no height or weight on file to calculate BMI.  GENERAL:alert, in no acute distress and comfortable SKIN: skin color, texture, turgor are normal, no rashes or significant lesions EYES: normal, conjunctiva are pink and non-injected, sclera clear OROPHARYNX:no exudate, no erythema and lips, buccal mucosa, and tongue normal  NECK: supple, no JVD, thyroid normal size, non-tender, without nodularity LYMPH:  no palpable lymphadenopathy in the cervical, axillary or inguinal LUNGS: clear to auscultation with normal respiratory effort BREAST- left breast healed lumpectomy scar. No other palpable lumps or regional LNpathy HEART: regular rate & rhythm,  no murmurs and no lower extremity edema ABDOMEN: abdomen soft, non-tender, normoactive bowel sounds  PSYCH: alert & oriented x 3 with fluent speech NEURO: no focal motor/sensory deficits  LABORATORY DATA:   I have reviewed the data as  listed  . CBC Latest Ref Rng & Units 10/04/2016 08/23/2016 07/05/2016  WBC 3.9 - 10.3 10e3/uL 6.8 7.2 8.0  Hemoglobin 11.6 - 15.9 g/dL 10.3(L) 10.6(L) 10.9(L)  Hematocrit 34.8 - 46.6 % 32.4(L) 33.0(L) 33.8(L)  Platelets 145 - 400 10e3/uL 359 296 331    . CMP Latest Ref Rng & Units 10/04/2016 08/23/2016 07/05/2016  Glucose 70 - 140 mg/dl 89 81 157(H)  BUN 7.0 - 26.0 mg/dL 12.8 9.7 12  Creatinine 0.6 - 1.1 mg/dL 0.7 0.7 0.70  Sodium 136 - 145 mEq/L 139 140 136  Potassium 3.5 - 5.1 mEq/L 4.0 3.9 4.4  Chloride 98 - 110 mmol/L - - 97(L)  CO2 22 - 29 mEq/L 30(H) 29 31  Calcium 8.4 - 10.4 mg/dL 9.9 10.0 9.7  Total Protein 6.4 - 8.3 g/dL 7.2 7.4 7.1  Total Bilirubin 0.20 - 1.20 mg/dL 0.24 0.31 0.4  Alkaline Phos 40 - 150 U/L 103 111 126  AST 5 - 34 U/L 31 27 39(H)  ALT 0 - 55 U/L 37 27 42(H)    25 OH vit D - 62.5        Microscopic Comment 1. BREAST, INVASIVE TUMOR Procedure: Seed localized lumpectomy. Laterality: Left breast. Tumor Size (gross  measurement): 1.0 cm Histologic Type: Ductal Grade: II Tubular Differentiation: 3 Nuclear Pleomorphism: 2 Mitotic Count: 1 Ductal Carcinoma in Situ (DCIS): Not identified Extent of Tumor: Confined to breast parenchyma Margins: Invasive carcinoma, distance from closest margin: Focally 0.1 cm to the anterior margin Regional Lymph Nodes: Number of Lymph Nodes Examined: 1 Number of Sentinel Lymph Nodes Examined: 1 Lymph Nodes with Macrometastases: 0 Lymph Nodes with Micrometastases: 0 Lymph Nodes with Isolated Tumor Cells: 0 Breast Prognostic Profile: [id number] Estrogen Receptor: 100%, strong Progesterone Receptor: 5%, strong Her2: No amplification was detected. The ratio was 1.27 1 of 3 FINAL for EVANELL, REDLICH B [phone]) Microscopic Comment(continued) Ki-67: 15% Pathologic Stage Classification (pTNM, AJCC 8th Edition): Primary Tumor (pT): pT1b Regional Lymph Nodes (pN): pN0 (JBK:ecj 07/12/2016) Enid Cutter MD Pathologist, Electronic Signature (Case signed 07/12/2016)     CLINICAL DATA:  Followup for probably benign right breast mass and probably benign left breast calcifications.  EXAM: 2D DIGITAL DIAGNOSTIC BILATERAL MAMMOGRAM WITH CAD AND ADJUNCT TOMO  BILATERAL BREAST ULTRASOUND  COMPARISON:  Previous exam(s).  ACR Breast Density Category c: The breast tissue is heterogeneously dense, which may obscure small masses.  FINDINGS: No suspicious masses or calcifications are seen in the right breast. The oval circumscribed mass in the upper-outer posterior right breast appears unchanged. Spot compression magnification views were performed over the inner left breast demonstrating unchanged 3 mm group of punctate calcifications. There is a mass with irregular margins in the lower inner left breast measuring approximately 7-8 mm.  Mammographic images were processed with CAD.  Physical examination of the left breast reveals a firm nodule at the 8 o'clock  position.  Targeted ultrasound of the left breast was performed demonstrating an irregular hypoechoic mass at 8 o'clock 2 cm from nipple measuring 0.8 x 0.6 x 0.7 cm. This corresponds well with the mass seen at mammography. No lymphadenopathy seen in the left axilla.  Targeted ultrasound of the right breast was performed demonstrating a mixed echogenicity mass at 10 o'clock 8 cm from nipple measuring 1.4 x 0.6 x 1.1 cm. Although this may represent an area of fat necrosis is indeterminate. No lymphadenopathy seen in the right axilla.  IMPRESSION: 1. Suspicious mass in the left breast at the 8 o'clock position.  2. Stable appearance of punctate calcifications in the lower inner left breast.  3. Indeterminate mass in the right breast at 10 o'clock 8 cm from the nipple which cannot be clearly characterized as an area of fat necrosis.  RECOMMENDATION: 1. Recommend ultrasound-guided biopsy of the mass in the left breast at the 8 o'clock position.  2. Recommend stereotactic guided biopsy of the calcifications in the lower inner left breast (given the suspicious mass seen in close proximity to the calcifications).  3. Recommend ultrasound-guided biopsy of the mass in the right breast at 10 o'clock position.  Biopsies are being scheduled for the patient.  I have discussed the findings and recommendations with the patient. Results were also provided in writing at the conclusion of the visit. If applicable, a reminder letter will be sent to the patient regarding the next appointment.  BI-RADS CATEGORY  4: Suspicious.   Electronically Signed   By: Everlean Alstrom M.D.   On: 05/12/2016 13:56   RADIOGRAPHIC STUDIES: I have personally reviewed the radiological images as listed and agreed with the findings in the report. Dg Bone Density  Result Date: 09/27/2016 EXAM: DUAL X-RAY ABSORPTIOMETRY (DXA) FOR BONE MINERAL DENSITY IMPRESSION: Referring Physician:  [name] PATIENT: Name: Felicia Gonzalez, Felicia Gonzalez Patient ID: [id number] Birth Date: 1941-03-08 Height: 61.0 in. Sex: Female Measured: 09/27/2016 Weight: 135.3 lbs. Indications: Advanced Age, Breast Cancer History, Estrogen Deficient, History of Osteoporosis, Left hip replaced, Osteoporosis (733), Postmenopausal, Prednisone, Rheumatoid Arthritis (714.0) Fractures: Left Hip Treatments: Fosamax, Multivitamin ASSESSMENT: The BMD measured at AP Spine L1-L4 is 0.798 g/cm2 with a T-score of -3.2. This patient is considered OSTEOPOROTIC according to [facility]) criteria. Per the official positions of the ISCD, it is not possible to quantitatively compare BMD or calculate an [facility] between exams done at different facilities. Site Region Measured Date Measured Age YA T-score BMD Significant CHANGE AP Spine    L1-L4  10/27/2016    75.7         -3.2    0.798 g/cm2 Right Femur Neck   10/27/2016    74.7         -3.1    0.614 g/cm2 World Health Organization [facility]) criteria for post-menopausal, Caucasian Women: Normal       T-score at or above -1 SD Osteopenia   T-score between -1 and -2.5 SD Osteoporosis T-score at or below -2.5 SD RECOMMENDATION: [place] recommends that FDA-approved medical therapies be considered in postmenopausal women and men age 84 or older with a: 1. Hip or vertebral (clinical or morphometric) fracture. 2. T-score of <-2.5 at the spine or hip. 3. Ten-year fracture probability by FRAX of 3% or greater for hip fracture or 20% or greater for major osteoporotic fracture. All treatment decisions require clinical judgment and consideration of individual patient factors, including patient preferences, co-morbidities, previous drug use, risk factors not captured in the FRAX model (e.g. falls, vitamin D deficiency, increased bone turnover, interval significant decline in bone density) and possible under - or over-estimation of fracture risk by FRAX. All patients should ensure an  adequate intake of dietary calcium (1200 mg/d) and vitamin D (800 IU daily) unless contraindicated. FOLLOW-UP: People with diagnosed cases of osteoporosis or at high risk for fracture should have regular bone mineral density tests. For patients eligible for Medicare, routine testing is allowed once every 2 years. The testing frequency can be increased to one year for patients who have rapidly progressing disease, those who are receiving or discontinuing  medical therapy to restore bone mass, or have additional risk factors. I have reviewed this report, and agree with the above findings. Mark A. Thornton Papas, M.D. [facility] Radiology Electronically Signed   By: Lavonia Dana M.D.   On: 09/27/2016 11:22    ASSESSMENT & PLAN:   76 year old female with  #1 Newly diagnosed Stage I (pT1b, pN0, Mx) left sided invasive ductal carcinoma of the breast. 1 cm in size with lymphovascular invasion grade 2/3, Ki-67 15% ER/PR positive HER-2/neu negative. One Sentinel lymph node biopsy negative No DCIS. Severe calcified benign lesions in left breast with PASH (pseudoangiomatous stromal hyperplasia).  Patient is status post left breast lumpectomy by Dr. Ninfa Linden on 07/09/2016. She has healed well from surgery  She shall be completing her adjuvant radiation therapy on 09/2016  #2 Severe osteoporosis - multiple risk factors including chronic steroid therapy, age, rheumatoid arthritis. Her bone density study shows significant osteoporosis despite being on Fosamax for 2 years. Last vitamin D levels in March were good at 62.5. Vitamin D levels from today are pending. She has had an osteoporotic left hip fracture around May 2016.  Plan -As per plan we discussed various options for adjuvant endocrine therapy. - We discussed that the typical adjuvant endocrine treatment option in postmenopausal hormone positive breast cancer is an aromatase inhibitor. The concerns in her case are that she has severe osteoporosis despite being  on adequate doses of calcium and vitamin D and Fosamax x 5yr and the fact that she previously has had an osteoporotic left hip fracture and the fact that she continues to be on chronic steroids for her rheumatoid arthritis.  -We discussed that aromatase inhibitors would likely worsen her osteoporosis and could increase her risk of additional     osteoporotic fractures. - We recommended that we switch her osteoporosis treatment from Fosamax to Prolia and after discussing pros and    Cons she is okay with starting on Prolia.  We discussed the pros and cons of using tamoxifen including increased risk of venous thromboembolism in the setting of chronic intermittent disorder and her age as well as an increase risk of uterine cancer which will need to be monitored.  We discussed the option of possibly using Raloxifen which might have some increased risk of venous thromboembolism but would be protective for her bones but might be somewhat less effective from a breast cancer risk reduction standpoint.  -Patient wants to think about the endocrine treatment options and get back to u[place]ore[place]in a week. I counseled her about all the 3 options noted below as well as the option to not do any adjuvant endocrine therapy and the risks and benefits of each option. She was also given patient education material about these medications to help her make an educated decision.  --Patient was scheduled for starting Prolia q639month -She is currently on vitamin D 5000 units daily. Would continue this and ensure intake of at least 1000 mg of calcium daily. Would recommend keeping her vitamin D level between 60 and 90. -She was recommended to stay physically active and pursue weightbearing exercises .  #2 Rheumatoid arthritis currently on Arava and prednisone. Was previously on Orencia. Plan -Continue follow-up with Dr. DeEstanislado Pandyrom rheumatology for ongoing treatments.  Prolia starting around 10/18/2016 q6 months for  osteoporosis with breast cancer. RTC with Dr KaIrene Limbon 3 months with labs Patient will call usKoreaegarding her decision regarding which adjuvant endocrine therapy she decides to go with .  I spent 20 minutes counseling  the patient face to face. The total time spent in the appointment was 30 minutes and more than 50% was on counseling and direct patient cares.    Sullivan Lone MD [place] AAHIVMS [facility] Hematology/Oncology Physician [facility]  (Office):       [phone] (Work cell):  [phone] (Fax):           [phone]

## 2016-10-05 ENCOUNTER — Ambulatory Visit
Admission: RE | Admit: 2016-10-05 | Discharge: 2016-10-05 | Disposition: A | Discharge status: Home / Self Care | Payer: Medicare Other | Source: Ambulatory Visit | Attending: Radiation Oncology | Admitting: Radiation Oncology

## 2016-10-05 DIAGNOSIS — Z510 Encounter for antineoplastic radiation therapy: Secondary | ICD-10-CM | POA: Diagnosis not present

## 2016-10-05 LAB — VITAMIN D 25 HYDROXY (VIT D DEFICIENCY, FRACTURES): Vitamin D, 25-Hydroxy: 65.1 ng/mL (ref 30.0–100.0)

## 2016-10-06 ENCOUNTER — Encounter: Payer: Self-pay | Admitting: Radiation Oncology

## 2016-10-06 NOTE — Progress Notes (Signed)
  Radiation Oncology         [phone] ________________________________  Name: Felicia Gonzalez MRN: [id number]  Date: 10/06/2016  DOB: 01/28/1941  End of Treatment Note  Diagnosis: Stage IA (pT1b, pN0) grade 2 invasive ductal carcinoma of the left breast (ER+,PR+,HER2-)     Indication for treatment:  Curative       Radiation treatment dates:  09/06/16-10/05/16  Site/dose:  1) Left breast/ 42.5 Gy in 16 fractions   2) Left breast boost/ 10 Gy in 4 fractions  Beams/energy:  1) 3D/ 6X, 10X    2) Isodose plan/ 10X, 6X  Narrative: The patient tolerated radiation treatment relatively well. Moderate hyperpigmentation was noted in the treatment area.  Plan: The patient has completed radiation treatment. The patient will return to radiation oncology clinic for routine followup in one month. I advised them to call or return sooner if they have any questions or concerns related to their recovery or treatment.  ------------------------------------------------  Jodelle Gross, MD, PhD  This document serves as a record of services personally performed by Kyung Rudd, MD. It was created on his behalf by Bethann Humble, a trained medical scribe. The creation of this record is based on the scribe's personal observations and the provider's statements to them. This document has been checked and approved by the attending provider.

## 2016-10-18 ENCOUNTER — Ambulatory Visit (HOSPITAL_BASED_OUTPATIENT_CLINIC_OR_DEPARTMENT_OTHER): Payer: Medicare Other

## 2016-10-18 VITALS — BP 150/74 | HR 89 | Temp 97.2°F | Resp 18

## 2016-10-18 DIAGNOSIS — Z170 Estrogen receptor positive status [ER+]: Secondary | ICD-10-CM

## 2016-10-18 DIAGNOSIS — Z780 Asymptomatic menopausal state: Secondary | ICD-10-CM | POA: Diagnosis not present

## 2016-10-18 DIAGNOSIS — M810 Age-related osteoporosis without current pathological fracture: Secondary | ICD-10-CM | POA: Diagnosis not present

## 2016-10-18 DIAGNOSIS — C50312 Malignant neoplasm of lower-inner quadrant of left female breast: Secondary | ICD-10-CM

## 2016-10-18 MED ORDER — DENOSUMAB 60 MG/ML ~~LOC~~ SOLN
60.0000 mg | Freq: Once | SUBCUTANEOUS | Status: AC
  Administered 2016-10-18: 60 mg via SUBCUTANEOUS
  Filled 2016-10-18: qty 1

## 2016-10-18 NOTE — Patient Instructions (Signed)

## 2016-10-30 NOTE — Addendum Note (Signed)
Addendum  created 10/30/16 0931 by Duane Boston, MD   Sign clinical note

## 2016-11-12 ENCOUNTER — Telehealth: Payer: Self-pay | Admitting: Hematology

## 2016-11-12 NOTE — Telephone Encounter (Signed)
Copy of appointment schedule given to patient,per patient request. 11/12/16

## 2016-11-16 ENCOUNTER — Other Ambulatory Visit: Payer: Self-pay | Admitting: Hematology

## 2016-11-16 MED ORDER — RALOXIFENE HCL 60 MG PO TABS: 60.0000 mg | ORAL_TABLET | Freq: Every day | ORAL | 2 refills | Status: DC

## 2016-11-16 MED ORDER — POLYSACCHARIDE IRON COMPLEX 15 MG/0.5ML PO LIQD: 150.0000 mg | Freq: Every day | ORAL | 1 refills | Status: DC

## 2016-11-30 ENCOUNTER — Encounter: Payer: Self-pay | Admitting: Radiation Oncology

## 2016-11-30 ENCOUNTER — Ambulatory Visit
Admission: RE | Admit: 2016-11-30 | Discharge: 2016-11-30 | Disposition: A | Discharge status: Home / Self Care | Payer: Medicare Other | Source: Ambulatory Visit | Attending: Radiation Oncology | Admitting: Radiation Oncology

## 2016-11-30 VITALS — BP 146/76 | HR 92 | Temp 97.8°F | Resp 18 | Ht 61.0 in | Wt 138.6 lb

## 2016-11-30 DIAGNOSIS — C50912 Malignant neoplasm of unspecified site of left female breast: Secondary | ICD-10-CM | POA: Insufficient documentation

## 2016-11-30 DIAGNOSIS — Z170 Estrogen receptor positive status [ER+]: Secondary | ICD-10-CM

## 2016-11-30 DIAGNOSIS — C50212 Malignant neoplasm of upper-inner quadrant of left female breast: Secondary | ICD-10-CM

## 2016-11-30 NOTE — Progress Notes (Signed)
Radiation Oncology         [phone] ________________________________  Name: Felicia Gonzalez MRN: [id number]  Date: 11/30/2016  DOB: Jul 04, 1940  Post Treatment Note  CC: Vernie Shanks, MD  [name], MD  Diagnosis:  Stage IA (pT1b, pN0) grade 2 invasive ductal carcinoma of the left breast (ER+,PR+,HER2-)     Interval Since Last Radiation:  8 weeks   09/06/16-10/05/16: 1) Left breast/ 42.5 Gy in 16 fractions 2) Left breast boost/ 10 Gy in 4 fractions  Narrative:  The patient returns today for routine follow-up. She tolerated radiotherapy well and began Evista about a month ago and will follow up with Dr. Irene Limbo in August.                    On review of systems, the patient states she's feeling well and reports her skin has improved and she's not had any concerns with her post treatment recovery. She denies chest pain or shortness of breath. No other complaints are noted.  ALLERGIES:  is allergic to penicillins and sulfa antibiotics.  Meds: Current Outpatient Prescriptions  Medication Sig Dispense Refill  . Cholecalciferol (VITAMIN D3) 5000 units TABS Take 5,000 Units by mouth daily after breakfast.    . hydroxychloroquine (PLAQUENIL) 200 MG tablet Take 1 tablet by mouth BID Monday thru Friday 120 tablet 1  . leflunomide (ARAVA) 20 MG tablet Take 1 tablet (20 mg total) by mouth daily. 90 tablet 0  . Multiple Vitamin (MULTIVITAMIN WITH MINERALS) TABS tablet Take 1 tablet by mouth daily.    . Omega-3 Fatty Acids (OMEGA-3 FISH OIL) 1200 MG CAPS Take 1,200 mg by mouth 2 (two) times daily.    Vladimir Faster Glycol-Propyl Glycol (SYSTANE OP) Place 1 drop into the left eye at bedtime.    . Polysaccharide Iron Complex 15 MG/0.5ML LIQD Take 150 mg by mouth daily. 150 mL 1  . predniSONE (DELTASONE) 5 MG tablet Take 1 tablet (5 mg total) by mouth daily with breakfast. 90 tablet 1  . raloxifene (EVISTA) 60 MG tablet Take 1 tablet (60 mg total) by mouth daily. 30 tablet 2  .  saccharomyces boulardii (FLORASTOR) 250 MG capsule Take 250 mg by mouth 2 (two) times daily.    . TRAVATAN Z 0.004 % SOLN ophthalmic solution Place 1 drop into the right eye at bedtime.    . TURMERIC PO Take 1 capsule by mouth daily after breakfast.    . acetaminophen (TYLENOL) 650 MG CR tablet Take 1,300 mg by mouth 2 (two) times daily as needed for pain. Take one hour prior to infusion     . albuterol (PROVENTIL HFA;VENTOLIN HFA) 108 (90 Base) MCG/ACT inhaler Inhale 1-2 puffs into the lungs every 6 (six) hours as needed for wheezing or shortness of breath.    [name] amLODipine (NORVASC) 2.5 MG tablet Take 2.5 mg by mouth daily.     No current facility-administered medications for this encounter.     Physical Findings:  height is _0  (1.549 m) and weight is 138 lb 9.6 oz (62.9 kg). Her oral temperature is 97.8 F (36.6 C). Her blood pressure is 146/76 (abnormal) and her pulse is 92. Her respiration is 18 and oxygen saturation is 100%.  Pain Assessment Pain Score: 0-No pain/10 In general this is a well appearing [place] female in no acute distress. She's alert and oriented x4 and appropriate throughout the examination. Cardiopulmonary assessment is negative for acute distress and she exhibits normal effort.  The left breast is intact without evidence of desquamation or hyperpigmentation.  Lab Findings: Lab Results  Component Value Date   WBC 6.8 10/04/2016   HGB 10.3 (L) 10/04/2016   HCT 32.4 (L) 10/04/2016   MCV 85.0 10/04/2016   PLT 359 10/04/2016     Radiographic Findings: No results found.  Impression/Plan: 1. Stage IA (pT1b, pN0) grade 2 invasive ductal carcinoma of the left breast (ER+,PR+,HER2-). The patient appears to be doing well overall since completing her radiotherapy. She will follow up with Dr. Irene Limbo in August and return as needed moving forward if she has questions or concerns regarding her previous treatment.     Carola Rhine, PAC

## 2016-11-30 NOTE — Addendum Note (Signed)
Encounter addended by: Malena Edman, RN on: 11/30/2016 12:05 PM<BR>    Actions taken: Charge Capture section accepted

## 2016-12-07 ENCOUNTER — Other Ambulatory Visit: Payer: Self-pay | Admitting: Rheumatology

## 2016-12-07 MED ORDER — LEFLUNOMIDE 20 MG PO TABS: 20.0000 mg | ORAL_TABLET | Freq: Every day | ORAL | 0 refills | Status: DC

## 2016-12-07 NOTE — Telephone Encounter (Signed)
Patient called requesting a refill on her [place].  She uses [facility] on [address].  Thank you

## 2016-12-07 NOTE — Telephone Encounter (Signed)
ok 

## 2016-12-07 NOTE — Telephone Encounter (Signed)
Last Visit: 09/08/16 Next Visit: 02/09/17 Labs 10/04/16 Hgb 10.3 Previously 10.6  Okay to refill Arava?

## 2016-12-09 ENCOUNTER — Telehealth: Payer: Self-pay

## 2016-12-09 NOTE — Telephone Encounter (Signed)
Prescription faxed to [facility] for polysaccharide iron complex. Confirmed fax receipt.

## 2017-01-17 ENCOUNTER — Other Ambulatory Visit (HOSPITAL_BASED_OUTPATIENT_CLINIC_OR_DEPARTMENT_OTHER): Payer: Medicare Other

## 2017-01-17 ENCOUNTER — Ambulatory Visit (HOSPITAL_BASED_OUTPATIENT_CLINIC_OR_DEPARTMENT_OTHER): Payer: Medicare Other | Admitting: Hematology

## 2017-01-17 ENCOUNTER — Telehealth: Payer: Self-pay

## 2017-01-17 ENCOUNTER — Encounter: Payer: Self-pay | Admitting: Hematology

## 2017-01-17 VITALS — BP 155/76 | HR 98 | Temp 98.3°F | Resp 17 | Ht 61.0 in | Wt 140.7 lb

## 2017-01-17 DIAGNOSIS — Z170 Estrogen receptor positive status [ER+]: Secondary | ICD-10-CM | POA: Diagnosis not present

## 2017-01-17 DIAGNOSIS — M810 Age-related osteoporosis without current pathological fracture: Secondary | ICD-10-CM

## 2017-01-17 DIAGNOSIS — C50312 Malignant neoplasm of lower-inner quadrant of left female breast: Secondary | ICD-10-CM

## 2017-01-17 DIAGNOSIS — C50912 Malignant neoplasm of unspecified site of left female breast: Secondary | ICD-10-CM

## 2017-01-17 DIAGNOSIS — D509 Iron deficiency anemia, unspecified: Secondary | ICD-10-CM

## 2017-01-17 LAB — CBC & DIFF AND RETIC
BASO%: 0.5 % (ref 0.0–2.0)
Basophils Absolute: 0 10*3/uL (ref 0.0–0.1)
EOS%: 2.3 % (ref 0.0–7.0)
Eosinophils Absolute: 0.2 10*3/uL (ref 0.0–0.5)
HEMATOCRIT: 31 % — AB (ref 34.8–46.6)
HEMOGLOBIN: 9.9 g/dL — AB (ref 11.6–15.9)
Immature Retic Fract: 4.2 % (ref 1.60–10.00)
LYMPH%: 20.3 % (ref 14.0–49.7)
MCH: 27.7 pg (ref 25.1–34.0)
MCHC: 31.9 g/dL (ref 31.5–36.0)
MCV: 86.8 fL (ref 79.5–101.0)
MONO#: 0.9 10*3/uL (ref 0.1–0.9)
MONO%: 12.4 % (ref 0.0–14.0)
NEUT%: 64.5 % (ref 38.4–76.8)
NEUTROS ABS: 4.7 10*3/uL (ref 1.5–6.5)
Platelets: 316 10*3/uL (ref 145–400)
RBC: 3.57 10*6/uL — ABNORMAL LOW (ref 3.70–5.45)
RDW: 15.3 % — AB (ref 11.2–14.5)
RETIC %: 1.42 % (ref 0.70–2.10)
Retic Ct Abs: 50.69 10*3/uL (ref 33.70–90.70)
WBC: 7.4 10*3/uL (ref 3.9–10.3)
lymph#: 1.5 10*3/uL (ref 0.9–3.3)

## 2017-01-17 LAB — COMPREHENSIVE METABOLIC PANEL
ALBUMIN: 2.9 g/dL — AB (ref 3.5–5.0)
ALK PHOS: 84 U/L (ref 40–150)
ALT: 35 U/L (ref 0–55)
AST: 36 U/L — AB (ref 5–34)
Anion Gap: 6 mEq/L (ref 3–11)
BUN: 9.6 mg/dL (ref 7.0–26.0)
CALCIUM: 9.6 mg/dL (ref 8.4–10.4)
CHLORIDE: 99 meq/L (ref 98–109)
CO2: 30 mEq/L — ABNORMAL HIGH (ref 22–29)
Creatinine: 0.7 mg/dL (ref 0.6–1.1)
EGFR: 79 mL/min/{1.73_m2} — AB (ref >90)
Glucose: 115 mg/dl (ref 70–140)
POTASSIUM: 3.9 meq/L (ref 3.5–5.1)
SODIUM: 135 meq/L — AB (ref 136–145)
Total Bilirubin: 0.24 mg/dL (ref 0.20–1.20)
Total Protein: 6.7 g/dL (ref 6.4–8.3)

## 2017-01-17 NOTE — Patient Instructions (Signed)
Thank you for choosing [facility] to provide your oncology and hematology care.  To afford each patient quality time with our providers, please arrive 30 minutes before your scheduled appointment time.  If you arrive late for your appointment, you may be asked to reschedule.  We strive to give you quality time with our providers, and arriving late affects you and other patients whose appointments are after yours.   If you are a no show for multiple scheduled visits, you may be dismissed from the clinic at the providers discretion.    Again, thank you for choosing [facility], our hope is that these requests will decrease the amount of time that you wait before being seen by our physicians.  ______________________________________________________________________  Should you have questions after your visit to the  Cancer Center, please contact our office at [phone] between the hours of 8:30 and 4:30 p.m.    Voicemails left after 4:30p.m will not be returned until the following business day.    For prescription refill requests, please have your pharmacy contact us directly.  Please also try to allow 48 hours for prescription requests.    Please contact the scheduling department for questions regarding scheduling.  For scheduling of procedures such as PET scans, CT scans, MRI, Ultrasound, etc please contact central scheduling at [phone].    Resources For Cancer Patients and Caregivers:   Oncolink.org:  A wonderful resource for patients and healthcare providers for information regarding your disease, ways to tract your treatment, what to expect, etc.     American Cancer Society:  800-227-2345  Can help patients locate various types of support and financial assistance  Cancer Care: 1-800-813-HOPE (4673) Provides financial assistance, online support groups, medication/co-pay assistance.    [place] DSS:  336-641-3447 Where to apply for food  stamps, Medicaid, and utility assistance  Medicare Rights Center: 800-333-4114 Helps people with Medicare understand their rights and benefits, navigate the Medicare system, and secure the quality healthcare they deserve  SCAT: 336-333-6589 [place] [employer]'s shared-ride transportation service for eligible riders who have a disability that prevents them from riding the fixed route bus.    For additional information on assistance programs please contact our social worker:   Grier Hock/Abigail Elmore:  [phone]            

## 2017-01-17 NOTE — Telephone Encounter (Signed)
Printed avs and calender for 11/19 per los

## 2017-01-19 ENCOUNTER — Telehealth: Payer: Self-pay | Admitting: Rheumatology

## 2017-01-19 ENCOUNTER — Ambulatory Visit: Payer: Medicare Other | Attending: Hematology | Admitting: Physical Therapy

## 2017-01-19 DIAGNOSIS — M25561 Pain in right knee: Secondary | ICD-10-CM | POA: Insufficient documentation

## 2017-01-19 DIAGNOSIS — M25661 Stiffness of right knee, not elsewhere classified: Secondary | ICD-10-CM | POA: Diagnosis not present

## 2017-01-19 DIAGNOSIS — M79621 Pain in right upper arm: Secondary | ICD-10-CM | POA: Insufficient documentation

## 2017-01-19 DIAGNOSIS — M6281 Muscle weakness (generalized): Secondary | ICD-10-CM | POA: Diagnosis present

## 2017-01-19 DIAGNOSIS — R2689 Other abnormalities of gait and mobility: Secondary | ICD-10-CM | POA: Diagnosis present

## 2017-01-19 DIAGNOSIS — M25562 Pain in left knee: Secondary | ICD-10-CM | POA: Insufficient documentation

## 2017-01-19 NOTE — Telephone Encounter (Signed)
Left message for patient that we would work patient into schedule to be seen by Dr. Estanislado Pandy on 01/20/17 at 2:15 pm and for her to contact the office if she is unable to come to that appointment.

## 2017-01-19 NOTE — Therapy (Signed)
[place], [place], [address] Phone: [phone]   Fax:  [phone]  Physical Therapy Evaluation  Patient Details  Name: Felicia Gonzalez MRN: [id number] Date of Birth: [birth year] Referring Provider: Dr. Sullivan Lone  Encounter Date: 01/19/2017      PT End of Session - 01/19/17 1623    Visit Number 1   Number of Visits 9   Date for PT Re-Evaluation 02/25/17   PT Start Time 3299   PT Stop Time 1604   PT Time Calculation (min) 49 min   Activity Tolerance Patient tolerated treatment well   Behavior During Therapy [facility] for tasks assessed/performed      Past Medical History:  Diagnosis Date  . Anemia   . Arthritis    rheumatoid +CCP neg RF +ANA synovitis   . Asthma   . Cancer [facility])    Breast cancer  . Glaucoma    right eye  . Osteoporosis     Past Surgical History:  Procedure Laterality Date  . BREAST LUMPECTOMY WITH RADIOACTIVE SEED AND SENTINEL LYMPH NODE BIOPSY Left 07/09/2016   Procedure: BREAST LUMPECTOMY WITH RADIOACTIVE SEED AND SENTINEL LYMPH NODE BIOPSY;  Surgeon: Coralie Keens, MD;  Location: [facility];  Service: General;  Laterality: Left;  . NO PAST SURGERIES    . TOTAL HIP ARTHROPLASTY Left 10/01/2014   Procedure: TOTAL HIP ARTHROPLASTY;  Surgeon: Garald Balding, MD;  Location: [facility];  Service: Orthopedics;  Laterality: Left;    There were no vitals filed for this visit.       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   Pertinent History h/o left THA 10/01/14 because of a fall and a fracture, and then went to rehab at [facility]. Last December a small cancer was found, which was removed 07/09/16.  Left SLNB was negative.  Had radiation  and finished 10/04/16. Before that she was feeling a lot better from a medication (Orencia), but got a bad flu and then cancer, and has been off that medication.  Is now on other medications for this. Referral says this:  "Patient with early stage breast cancer on adjuvant endocrine therapy with fatigue and balance issues. Please evaluate and treatment for strength/endurance/balance and ROM."  Breast cancer was on left side. HTN controlled with meds. Osteoporosis, and gets injections for this every 6 months.   Patient Stated Goals Wants her knees and joints to feel better.   Currently in Pain? No/denies  but sometimes in right upper arm and both knees   Pain Score --  gets relief from heating pad, a pain patch from [place]            [facility] PT Assessment - 01/19/17 0001      Assessment   Medical Diagnosis right breast cancer and on Evista with joint pain and right arm pain  also has RA   Referring Provider Dr. Sullivan Lone   Onset Date/Surgical Date 07/09/16   Hand Dominance Right   Prior Therapy none     Precautions   Precautions Fall;Other (comment)  Pt. reports hemoglobin went down to 9.9 and is on meds forit   Precaution Comments cancer precautions     Restrictions   Weight  Bearing Restrictions No     Balance Screen   Has the patient fallen in the past 6 months No   Has the patient had a decrease in activity level because of a fear of falling?  No   Is the patient reluctant to leave their home because of a fear of falling?  No     Home Environment   Living Environment Private residence   Living Arrangements Spouse/significant other   Type of Hope One level;Laundry or work area in basement     Prior Function   Level of Independence Independent   Leisure started walking in the house, but then quit that with leg and arm pain; is active with housework     Cognition   Overall Cognitive Status Within Functional Limits for tasks assessed      Observation/Other Assessments   Observations skin well healed from both breast and SLNB incisions     Posture/Postural Control   Posture/Postural Control Postural limitations   Postural Limitations Forward head  significant     ROM / Strength   AROM / PROM / Strength AROM;Strength     AROM   Overall AROM Comments Both LEs grossly WFL except right knee active extension to -15 degrees, flexion to 105; left extension -7 degrees, flexion 112.   AROM Assessment Site Shoulder   Right/Left Shoulder Right;Left   Right Shoulder Flexion 130 Degrees   Right Shoulder ABduction 133 Degrees   Right Shoulder Internal Rotation 75 Degrees  in sitting   Right Shoulder External Rotation 85 Degrees  approx., in sitting   Left Shoulder Flexion 149 Degrees   Left Shoulder ABduction 158 Degrees   Left Shoulder Internal Rotation --  WFL in sitting   Left Shoulder External Rotation 75 Degrees  approx., in sitting     Strength   Overall Strength Comments right shoulder grossly 4/5, left 4+/5.  Hip extensors and knee flexors showed poor resistance in sitting, but not teste against gravity today.    able to heel- and toe-walk a little bit   Strength Assessment Site Hip;Knee;Ankle   Right/Left Hip Right;Left   Right Hip Flexion 4+/5   Left Hip Flexion 4+/5   Right/Left Knee Right;Left   Right Knee Extension 4+/5   Left Knee Extension 5/5   Right/Left Ankle Right;Left     Ambulation/Gait   Ambulation/Gait Yes   Gait Pattern Trendelenburg  right LE antalgic with significant limp at times, Rt. Trend.   Stairs --  pt. reports using railing, step-to pattern often            Objective measurements completed on examination: See above findings.                          [facility] Goals - 01/19/17 1638      CC Long Term Goal  #1   Title Pt. will be independent in home exercise program for UE and LE strengthening.   Time 4   Period Weeks   Status New   Target Date  02/25/17     CC Long Term Goal  #2   Title Pt. will report at least 25% decrease in pain overall.   Time 4   Period Weeks   Status New   Target Date 02/25/17     CC Long Term Goal  #3   Title Pt. will be knowledgeable about community programs available to her for exercise such  as [employer] [facility] [employer] and senior centers in town   Time [place]   Target Date 02/25/17             Plan - 01/19/17 1623    Clinical Impression Statement This is a pleasant woman s/p lumpectomy with SLNB and radiation for left breast cancer. She is on Evista. She has right upper arm pain and bilat. knee pain currently with decreased right knee ROM. She has rheumatoid arthritis, and it is difficult to know how much a factor this is.  She has osteoporosis with a h/o falling and sustaining a left hip fracture that required THA a couple of years ago. She walks with a limp, though the degree of it depends on whether she is warmed up, it seems.  She does have some weakness in both UEs and LEs.   History and Personal Factors relevant to plan of care: rhematoid arthritis, h/o left THA   Clinical Presentation Evolving   Clinical Presentation due to: recent onset of pain issues that are unresolved   Clinical Decision Making Moderate   Rehab Potential Good   PT Frequency 2x / week   PT Duration 4 weeks   PT Treatment/Interventions ADLs/Self Care Home Management;Electrical Stimulation;Moist Heat;Cryotherapy;DME Instruction;Gait training;Stair training;Therapeutic activities;Therapeutic exercise;Balance training;Neuromuscular re-education;Patient/family education;Manual techniques;Passive range of motion   PT Next Visit Plan Test strength of hip abductors, hip extensors, knee flexors against gravity.  Do TUG or DGI. Begin strengthening of LEs and UEs.  Consider starting with SLRs in lying or other exercises involving knee isometrics. Use cold packs, heat, or estim prn pain.   Consulted and Agree with  Plan of Care Patient      Patient will benefit from skilled therapeutic intervention in order to improve the following deficits and impairments:  Abnormal gait, Decreased range of motion, Decreased strength, Pain  Visit Diagnosis: Stiffness of right knee, not elsewhere classified - Plan: PT plan of care cert/re-cert  Muscle weakness (generalized) - Plan: PT plan of care cert/re-cert  Pain in right upper arm - Plan: PT plan of care cert/re-cert  Right knee pain, unspecified chronicity - Plan: PT plan of care cert/re-cert  Left knee pain, unspecified chronicity - Plan: PT plan of care cert/re-cert  Other abnormalities of gait and mobility - Plan: PT plan of care cert/re-cert      G-Codes - 37/90/24 1640    Functional Assessment Tool Used (Outpatient Only) clinical judgement   Functional Limitation Mobility: Walking and moving around   Mobility: Walking and Moving Around Current Status (O9735) At least 40 percent but less than 60 percent impaired, limited or restricted   Mobility: Walking and Moving Around Goal Status (H2992) At least 1 percent but less than 20 percent impaired, limited or restricted       Problem List Patient Active Problem List   Diagnosis Date Noted  . Malignant neoplasm of lower-inner quadrant of left breast in female, estrogen receptor positive ([place]) 10/04/2016  . Osteoporosis 10/04/2016  . Malignant neoplasm of upper-inner quadrant of left breast in female, estrogen receptor positive ([place]) 08/26/2016  . High risk medication use 06/08/2016  . Age-related osteoporosis without current pathological fracture 06/08/2016  . ANA positive 06/08/2016  . Moderate persistent asthma 06/08/2016  . Normocytic anemia 10/01/2014  . Rheumatoid arthritis of multiple sites without organ or system involvement with positive rheumatoid factor ([place]) 10/01/2014  . Hip fracture ([place]) 10/01/2014  . Subcapital fracture of left hip ([place]) 10/01/2014  .  Closed left hip fracture ([place])  09/30/2014    SALISBURY,DONNA 01/19/2017, 4:43 PM  [place], [place], [address] Phone: [phone]   Fax:  [phone]  Name: Felicia Gonzalez MRN: [id number] Date of Birth: Oct 15, 1940  Serafina Royals, PT 01/19/17 4:43 PM

## 2017-01-19 NOTE — Telephone Encounter (Signed)
Patient requesting to come in to be seen for knee. Patient has had pain for two weeks constant. Ice is not helping now. Patient feels like she has fluid on her knee. Please call to advise. Patient will be at PT, if she does not answer leave a detailed message.

## 2017-01-19 NOTE — Progress Notes (Signed)
Office Visit Note  Patient: Felicia Gonzalez             Date of Birth: 04/02/1941           MRN: [id number]             PCP: Vernie Shanks, MD Referring: Vernie Shanks, MD Visit Date: 01/20/2017 Occupation: _0 @    Subjective:  Pain and swelling in multiple joints.   History of Present Illness: Felicia Gonzalez is a 76 y.o. female with history of sero positive rheumatoid arthritis. She's been off Orencia since December 2017 due to diagnosis of breast cancer. She's been taking Arava and Plaquenil. She is also on prednisone 5 mg a day. Despite of these medication she is having a flare of her arthritis. She's been having increased pain and swelling in her bilateral hands bilateral knee joints and bilateral wrist joints. Her right knee joint has been swollen and very painful. She wants to have cortisone injection to her knee joint. She finished radiation therapy for breast cancer on May 2018.  Activities of Daily Living:  Patient reports morning stiffness for 1 hour.   Patient Reports nocturnal pain.  Difficulty dressing/grooming: Reports Difficulty climbing stairs: Reports Difficulty getting out of chair: Reports Difficulty using hands for taps, buttons, cutlery, and/or writing: Reports   Review of Systems  Constitutional: Positive for fatigue. Negative for night sweats, weight gain, weight loss and weakness.  HENT: Negative for mouth sores, trouble swallowing, trouble swallowing, mouth dryness and nose dryness.   Eyes: Negative for pain, redness, visual disturbance and dryness.  Respiratory: Negative for cough, shortness of breath and difficulty breathing.   Cardiovascular: Negative for chest pain, palpitations, hypertension, irregular heartbeat and swelling in legs/feet.  Gastrointestinal: Negative for blood in stool, constipation and diarrhea.  Endocrine: Negative for increased urination.  Genitourinary: Negative for vaginal dryness.  Musculoskeletal: Positive for arthralgias,  joint pain, joint swelling and morning stiffness. Negative for myalgias, muscle weakness, muscle tenderness and myalgias.  Skin: Negative for color change, rash, hair loss, skin tightness, ulcers and sensitivity to sunlight.  Allergic/Immunologic: Negative for susceptible to infections.  Neurological: Negative for dizziness, memory loss and night sweats.  Hematological: Negative for swollen glands.  Psychiatric/Behavioral: Negative for depressed mood and sleep disturbance. The patient is not nervous/anxious.     PMFS History:  Patient Active Problem List   Diagnosis Date Noted  . Malignant neoplasm of lower-inner quadrant of left breast in female, estrogen receptor positive ([place]) 10/04/2016  . Osteoporosis 10/04/2016  . Malignant neoplasm of upper-inner quadrant of left breast in female, estrogen receptor positive ([place]) 08/26/2016  . High risk medication use 06/08/2016  . Age-related osteoporosis without current pathological fracture 06/08/2016  . ANA positive 06/08/2016  . Moderate persistent asthma 06/08/2016  . Normocytic anemia 10/01/2014  . Rheumatoid arthritis of multiple sites without organ or system involvement with positive rheumatoid factor ([place]) 10/01/2014  . Subcapital fracture of left hip ([place]) 10/01/2014    Past Medical History:  Diagnosis Date  . Anemia   . Arthritis    rheumatoid +CCP neg RF +ANA synovitis   . Asthma   . Cancer [facility])    Breast cancer  . Glaucoma    right eye  . Osteoporosis     Family History  Problem Relation Age of Onset  . Hypertension Father    Past Surgical History:  Procedure Laterality Date  . BREAST LUMPECTOMY WITH RADIOACTIVE SEED AND SENTINEL LYMPH NODE BIOPSY Left 07/09/2016  Procedure: BREAST LUMPECTOMY WITH RADIOACTIVE SEED AND SENTINEL LYMPH NODE BIOPSY;  Surgeon: Coralie Keens, MD;  Location: [facility];  Service: General;  Laterality: Left;  . NO PAST SURGERIES    . TOTAL HIP ARTHROPLASTY Left 10/01/2014   Procedure: TOTAL HIP  ARTHROPLASTY;  Surgeon: Garald Balding, MD;  Location: [facility];  Service: Orthopedics;  Laterality: Left;   Social History   Social History Narrative  . No narrative on file     Objective: Vital Signs: BP (!) 142/72   Pulse 82   Resp 14   Wt 140 lb (63.5 kg)   BMI 26.45 kg/m    Physical Exam  Constitutional: She is oriented to person, place, and time. She appears well-developed and well-nourished.  HENT:  Head: Normocephalic and atraumatic.  Eyes: Conjunctivae and EOM are normal.  Neck: Normal range of motion.  Cardiovascular: Normal rate, regular rhythm, normal heart sounds and intact distal pulses.   Pulmonary/Chest: Effort normal and breath sounds normal.  Abdominal: Soft. Bowel sounds are normal.  Lymphadenopathy:    She has no cervical adenopathy.  Neurological: She is alert and oriented to person, place, and time.  Skin: Skin is warm and dry. Capillary refill takes less than 2 seconds.  Psychiatric: She has a normal mood and affect. Her behavior is normal.  Nursing note and vitals reviewed.    Musculoskeletal Exam: C-spine and thoracic lumbar spine good range of motion. Shoulder joints elbow joints wrist joints are good range of motion. She has synovial thickening and synovitis over bilateral wrist joints bilateral MCP joints as described below. She has warmth swelling or effusion and bilateral knee joints.  CDAI Exam: CDAI Homunculus Exam:   Tenderness:  RUE: wrist LUE: wrist Right hand: 2nd MCP and 3rd MCP Left hand: 2nd MCP and 3rd MCP RLE: tibiofemoral LLE: tibiofemoral  Swelling:  RUE: wrist LUE: wrist Right hand: 2nd MCP and 3rd MCP Left hand: 2nd MCP and 3rd MCP RLE: tibiofemoral LLE: tibiofemoral  Joint Counts:  CDAI Tender Joint count: 8 CDAI Swollen Joint count: 8  Global Assessments:  Patient Global Assessment: 7 Provider Global Assessment: 7  CDAI Calculated Score: 30    Investigation: No additional findings. CBC Latest Ref Rng &  Units 01/17/2017 10/04/2016 08/23/2016  WBC 3.9 - 10.3 10e3/uL 7.4 6.8 7.2  Hemoglobin 11.6 - 15.9 g/dL 9.9(L) 10.3(L) 10.6(L)  Hematocrit 34.8 - 46.6 % 31.0(L) 32.4(L) 33.0(L)  Platelets 145 - 400 10e3/uL 316 359 296   CMP     Component Value Date/Time   NA 135 (L) 01/17/2017 1315   K 3.9 01/17/2017 1315   CL 97 (L) 07/05/2016 1307   CO2 30 (H) 01/17/2017 1315   GLUCOSE 115 01/17/2017 1315   BUN 9.6 01/17/2017 1315   CREATININE 0.7 01/17/2017 1315   CALCIUM 9.6 01/17/2017 1315   PROT 6.7 01/17/2017 1315   ALBUMIN 2.9 (L) 01/17/2017 1315   AST 36 (H) 01/17/2017 1315   ALT 35 01/17/2017 1315   ALKPHOS 84 01/17/2017 1315   BILITOT 0.24 01/17/2017 1315   GFRNONAA 86 07/05/2016 1307   GFRAA >89 07/05/2016 1307    Imaging: No results found.  Speciality Comments: No specialty comments available.    Procedures:  Large Joint Inj Date/Time: 01/20/2017 3:07 PM Performed by: Bo Merino Authorized by: Bo Merino   Consent Given by:  Patient Site marked: the procedure site was marked   Timeout: prior to procedure the correct patient, procedure, and site was verified   Indications:  Pain and joint swelling Location:  Knee Site:  R knee Prep: patient was prepped and draped in usual sterile fashion   Needle Size:  27 G Needle Length:  1.5 inches Approach:  Medial Ultrasound Guidance: No   Fluoroscopic Guidance: No   Arthrogram: No   Medications:  1.5 mL lidocaine 1 %; 40 mg triamcinolone acetonide 40 MG/ML Aspiration Attempted: Yes   Aspirate amount (mL):  0 Patient tolerance:  Patient tolerated the procedure well with no immediate complications   Allergies: Penicillins and Sulfa antibiotics   Assessment / Plan:     Visit Diagnoses: Rheumatoid arthritis of multiple sites without organ or system involvement with positive rheumatoid factor (HCC) - Positive RF, positive anti-CCP, positive ANA, elevated ESR. She is having a flare with increased pain and swelling in  multiple joints. She also has severe synovitis in bilateral hands and bilateral knee joints with effusion.  ANA positive  High risk medication use - Arava 20 mg by mouth daily, Plaquenil 200 mg by mouth twice a day Monday through Friday, prednisone 5 mg by mouth daily ( Ttd with IV Orencia in the past). Her labs have been stable. Her LFTs are mildly elevated. She is generally well on Orencia in the past. Usually we hold all the Biologics for 5 years after diagnosis of  cancer. I would like to know Dr. Grier Mitts opinion if Orencia IV can be resumed. We will check her labs in October and every 3 months to monitor for drug toxicity.  Rheumatoid arthritis involving both knees with positive rheumatoid factor ([place]): She's having increased pain and discomfort in her bilateral knee joints with the  small effusion. We'll request after informed consent was obtained right knee joint was prepped and stroke fashion injected with cortisone as described above. She started the procedure well.  Age-related osteoporosis without current pathological fracture - on Evista and Prolia subcutaneous now. Use of calcium and vitamin D and resistive exercises was discussed.  Closed fracture of left hip, sequela  Moderate persistent asthma  Malignant neoplasm of upper-inner quadrant of left breast in female, estrogen receptor positive ([place])    Orders: Orders Placed This Encounter  Procedures  . Large Joint Injection/Arthrocentesis   No orders of the defined types were placed in this encounter.     Follow-Up Instructions: Return in about 3 months (around 04/22/2017) for Rheumatoid arthritis, Osteoporosis.   Bo Merino, MD  Note - This record has been created using Editor, commissioning.  Chart creation errors have been sought, but may not always  have been located. Such creation errors do not reflect on  the standard of medical care.

## 2017-01-20 ENCOUNTER — Ambulatory Visit (INDEPENDENT_AMBULATORY_CARE_PROVIDER_SITE_OTHER): Payer: Medicare Other | Admitting: Rheumatology

## 2017-01-20 ENCOUNTER — Encounter: Payer: Self-pay | Admitting: Rheumatology

## 2017-01-20 VITALS — BP 142/72 | HR 82 | Resp 14 | Wt 140.0 lb

## 2017-01-20 DIAGNOSIS — M05762 Rheumatoid arthritis with rheumatoid factor of left knee without organ or systems involvement: Secondary | ICD-10-CM

## 2017-01-20 DIAGNOSIS — Z79899 Other long term (current) drug therapy: Secondary | ICD-10-CM

## 2017-01-20 DIAGNOSIS — Z170 Estrogen receptor positive status [ER+]: Secondary | ICD-10-CM

## 2017-01-20 DIAGNOSIS — G8929 Other chronic pain: Secondary | ICD-10-CM

## 2017-01-20 DIAGNOSIS — J4540 Moderate persistent asthma, uncomplicated: Secondary | ICD-10-CM

## 2017-01-20 DIAGNOSIS — M25561 Pain in right knee: Secondary | ICD-10-CM

## 2017-01-20 DIAGNOSIS — M05761 Rheumatoid arthritis with rheumatoid factor of right knee without organ or systems involvement: Secondary | ICD-10-CM

## 2017-01-20 DIAGNOSIS — C50212 Malignant neoplasm of upper-inner quadrant of left female breast: Secondary | ICD-10-CM

## 2017-01-20 DIAGNOSIS — R768 Other specified abnormal immunological findings in serum: Secondary | ICD-10-CM

## 2017-01-20 DIAGNOSIS — S72002S Fracture of unspecified part of neck of left femur, sequela: Secondary | ICD-10-CM

## 2017-01-20 DIAGNOSIS — M810 Age-related osteoporosis without current pathological fracture: Secondary | ICD-10-CM

## 2017-01-20 DIAGNOSIS — M25562 Pain in left knee: Secondary | ICD-10-CM

## 2017-01-20 DIAGNOSIS — M0579 Rheumatoid arthritis with rheumatoid factor of multiple sites without organ or systems involvement: Secondary | ICD-10-CM

## 2017-01-20 MED ORDER — LIDOCAINE HCL 1 % IJ SOLN
1.5000 mL | INTRAMUSCULAR | Status: AC | PRN
Start: 2017-01-20 — End: 2017-01-20
  Administered 2017-01-20: 1.5 mL

## 2017-01-20 MED ORDER — TRIAMCINOLONE ACETONIDE 40 MG/ML IJ SUSP
40.0000 mg | INTRAMUSCULAR | Status: AC | PRN
  Administered 2017-01-20: 40 mg via INTRA_ARTICULAR

## 2017-01-20 NOTE — Patient Instructions (Signed)
Standing Labs We placed an order today for your standing lab work.    Please come back and get your standing labs in October and every 3 months  We have open lab Monday through Friday from 8:30-11:30 AM and 1:30-4 PM at the office of Dr. Bo Merino.   The office is located at [address], [place], [place], [place] [address] No appointment is necessary.   Labs are drawn by [facility].  You may receive a bill from [facility] for your lab work. If you have any questions regarding directions or hours of operation,  please call [phone].

## 2017-01-23 NOTE — Progress Notes (Signed)
Felicia Gonzalez Kitchen  HEMATOLOGY ONCOLOGY CLINIC NOTE  Date of service: .01/17/2017  Patient Care Team: Vernie Shanks, MD as PCP - General (Family Medicine)  Chief Complaint: f/u for management of hormone positive Her 2 neg breast cancer  Diagnosis  Stage I (pT1b, pN0, Mx) left sided invasive ductal carcinoma of the breast. 1 cm in size with lymphovascular invasion grade 2/3, Ki-67 15% ER/PR positive HER-2/neu negative. One Sentinel lymph node biopsy negative No DCIS. Severe calcified benign lesions in left breast with PASH (pseudoangiomatous stromal hyperplasia).  Patient is status post left breast lumpectomy by Dr. Ninfa Linden on 07/09/2016.   S/p adjuvant  Left breast RT on 10/05/2016   INTERVAL HISTORY:  Felicia Gonzalez is here for her scheduled follow-up for stage I left breast cancer  She is currently on Evista and is here for her scheduled f/u for a toxicity check. She notes some generalized fatigue that could be from her RA and some joint pains related to this as well. No other acute new symptoms. Has not noted any new breast lumps or discomfort. Taking daily ASA. No significant hot flashes.    REVIEW OF SYSTEMS:    10 Point review of systems of done and is negative except as noted above.  . Past Medical History:  Diagnosis Date  . Anemia   . Arthritis    rheumatoid +CCP neg RF +ANA synovitis   . Asthma   . Cancer [facility])    Breast cancer  . Glaucoma    right eye  . Osteoporosis      . Past Surgical History:  Procedure Laterality Date  . BREAST LUMPECTOMY WITH RADIOACTIVE SEED AND SENTINEL LYMPH NODE BIOPSY Left 07/09/2016   Procedure: BREAST LUMPECTOMY WITH RADIOACTIVE SEED AND SENTINEL LYMPH NODE BIOPSY;  Surgeon: Coralie Keens, MD;  Location: [place];  Service: General;  Laterality: Left;  . NO PAST SURGERIES    . TOTAL HIP ARTHROPLASTY Left 10/01/2014   Procedure: TOTAL HIP ARTHROPLASTY;  Surgeon: Garald Balding, MD;  Location: [place];  Service: Orthopedics;  Laterality: Left;      . Social History  Substance Use Topics  . Smoking status: Never Smoker  . Smokeless tobacco: Never Used  . Alcohol use No    ALLERGIES:  is allergic to penicillins and sulfa antibiotics.  MEDICATIONS:  Current Outpatient Prescriptions  Medication Sig Dispense Refill  . acetaminophen (TYLENOL) 650 MG CR tablet Take 1,300 mg by mouth 2 (two) times daily as needed for pain. Take one hour prior to infusion     . albuterol (PROVENTIL HFA;VENTOLIN HFA) 108 (90 Base) MCG/ACT inhaler Inhale 1-2 puffs into the lungs every 6 (six) hours as needed for wheezing or shortness of breath.    . Cholecalciferol (VITAMIN D3) 5000 units TABS Take 5,000 Units by mouth daily after breakfast.    . hydroxychloroquine (PLAQUENIL) 200 MG tablet Take 1 tablet by mouth BID Monday thru Friday 120 tablet 1  . leflunomide (ARAVA) 20 MG tablet Take 1 tablet (20 mg total) by mouth daily. 90 tablet 0  . Multiple Vitamin (MULTIVITAMIN WITH MINERALS) TABS tablet Take 1 tablet by mouth daily.    . Omega-3 Fatty Acids (OMEGA-3 FISH OIL) 1200 MG CAPS Take 1,200 mg by mouth 2 (two) times daily.    Vladimir Faster Glycol-Propyl Glycol (SYSTANE OP) Place 1 drop into the left eye at bedtime.    . Polysaccharide Iron Complex 15 MG/0.5ML LIQD Take 150 mg by mouth daily. 150 mL 1  . predniSONE (DELTASONE)  5 MG tablet Take 1 tablet (5 mg total) by mouth daily with breakfast. 90 tablet 1  . raloxifene (EVISTA) 60 MG tablet Take 1 tablet (60 mg total) by mouth daily. 30 tablet 2  . saccharomyces boulardii (FLORASTOR) 250 MG capsule Take 250 mg by mouth 2 (two) times daily.    . TRAVATAN Z 0.004 % SOLN ophthalmic solution Place 1 drop into the right eye at bedtime.    . TURMERIC PO Take 1 capsule by mouth daily after breakfast.    . amLODipine (NORVASC) 5 MG tablet     . aspirin EC 81 MG tablet Take 81 mg by mouth daily.     No current facility-administered medications for this visit.     PHYSICAL EXAMINATION: ECOG PERFORMANCE  STATUS: 1 - Symptomatic but completely ambulatory  . Vitals:   01/17/17 1331  BP: (!) 155/76  Pulse: 98  Resp: 17  Temp: 98.3 F (36.8 C)  SpO2: 99%    Filed Weights   01/17/17 1331  Weight: 140 lb 11.2 oz (63.8 kg)   .Body mass index is 26.59 kg/m.  GENERAL:alert, in no acute distress and comfortable SKIN: skin color, texture, turgor are normal, no rashes or significant lesions EYES: normal, conjunctiva are pink and non-injected, sclera clear OROPHARYNX:no exudate, no erythema and lips, buccal mucosa, and tongue normal  NECK: supple, no JVD, thyroid normal size, non-tender, without nodularity LYMPH:  no palpable lymphadenopathy in the cervical, axillary or inguinal LUNGS: clear to auscultation with normal respiratory effort BREAST- left breast healed lumpectomy scar. No other palpable lumps or regional LNpathy HEART: regular rate & rhythm,  no murmurs and no lower extremity edema ABDOMEN: abdomen soft, non-tender, normoactive bowel sounds  PSYCH: alert & oriented x 3 with fluent speech NEURO: no focal motor/sensory deficits  LABORATORY DATA:   I have reviewed the data as listed  . CBC Latest Ref Rng & Units 01/17/2017 10/04/2016 08/23/2016  WBC 3.9 - 10.3 10e3/uL 7.4 6.8 7.2  Hemoglobin 11.6 - 15.9 g/dL 9.9(L) 10.3(L) 10.6(L)  Hematocrit 34.8 - 46.6 % 31.0(L) 32.4(L) 33.0(L)  Platelets 145 - 400 10e3/uL 316 359 296    . CMP Latest Ref Rng & Units 01/17/2017 10/04/2016 08/23/2016  Glucose 70 - 140 mg/dl 115 89 81  BUN 7.0 - 26.0 mg/dL 9.6 12.8 9.7  Creatinine 0.6 - 1.1 mg/dL 0.7 0.7 0.7  Sodium 136 - 145 mEq/L 135(L) 139 140  Potassium 3.5 - 5.1 mEq/L 3.9 4.0 3.9  Chloride 98 - 110 mmol/L - - -  CO2 22 - 29 mEq/L 30(H) 30(H) 29  Calcium 8.4 - 10.4 mg/dL 9.6 9.9 10.0  Total Protein 6.4 - 8.3 g/dL 6.7 7.2 7.4  Total Bilirubin 0.20 - 1.20 mg/dL 0.24 0.24 0.31  Alkaline Phos 40 - 150 U/L 84 103 111  AST 5 - 34 U/L 36(H) 31 27  ALT 0 - 55 U/L 35 37 27    25 OH vit D -  62.5        Microscopic Comment 1. BREAST, INVASIVE TUMOR Procedure: Seed localized lumpectomy. Laterality: Left breast. Tumor Size (gross measurement): 1.0 cm Histologic Type: Ductal Grade: II Tubular Differentiation: 3 Nuclear Pleomorphism: 2 Mitotic Count: 1 Ductal Carcinoma in Situ (DCIS): Not identified Extent of Tumor: Confined to breast parenchyma Margins: Invasive carcinoma, distance from closest margin: Focally 0.1 cm to the anterior margin Regional Lymph Nodes: Number of Lymph Nodes Examined: 1 Number of Sentinel Lymph Nodes Examined: 1 Lymph Nodes with Macrometastases: 0 Lymph  Nodes with Micrometastases: 0 Lymph Nodes with Isolated Tumor Cells: 0 Breast Prognostic Profile: [id number] Estrogen Receptor: 100%, strong Progesterone Receptor: 5%, strong Her2: No amplification was detected. The ratio was 1.27 1 of 3 FINAL for CHANELL, NADEAU B [phone]) Microscopic Comment(continued) Ki-67: 15% Pathologic Stage Classification (pTNM, AJCC 8th Edition): Primary Tumor (pT): pT1b Regional Lymph Nodes (pN): pN0 (JBK:ecj 07/12/2016) Enid Cutter MD Pathologist, Electronic Signature (Case signed 07/12/2016)     CLINICAL DATA:  Followup for probably benign right breast mass and probably benign left breast calcifications.  EXAM: 2D DIGITAL DIAGNOSTIC BILATERAL MAMMOGRAM WITH CAD AND ADJUNCT TOMO  BILATERAL BREAST ULTRASOUND  COMPARISON:  Previous exam(s).  ACR Breast Density Category c: The breast tissue is heterogeneously dense, which may obscure small masses.  FINDINGS: No suspicious masses or calcifications are seen in the right breast. The oval circumscribed mass in the upper-outer posterior right breast appears unchanged. Spot compression magnification views were performed over the inner left breast demonstrating unchanged 3 mm group of punctate calcifications. There is a mass with irregular margins in the lower inner left breast measuring  approximately 7-8 mm.  Mammographic images were processed with CAD.  Physical examination of the left breast reveals a firm nodule at the 8 o'clock position.  Targeted ultrasound of the left breast was performed demonstrating an irregular hypoechoic mass at 8 o'clock 2 cm from nipple measuring 0.8 x 0.6 x 0.7 cm. This corresponds well with the mass seen at mammography. No lymphadenopathy seen in the left axilla.  Targeted ultrasound of the right breast was performed demonstrating a mixed echogenicity mass at 10 o'clock 8 cm from nipple measuring 1.4 x 0.6 x 1.1 cm. Although this may represent an area of fat necrosis is indeterminate. No lymphadenopathy seen in the right axilla.  IMPRESSION: 1. Suspicious mass in the left breast at the 8 o'clock position.  2. Stable appearance of punctate calcifications in the lower inner left breast.  3. Indeterminate mass in the right breast at 10 o'clock 8 cm from the nipple which cannot be clearly characterized as an area of fat necrosis.  RECOMMENDATION: 1. Recommend ultrasound-guided biopsy of the mass in the left breast at the 8 o'clock position.  2. Recommend stereotactic guided biopsy of the calcifications in the lower inner left breast (given the suspicious mass seen in close proximity to the calcifications).  3. Recommend ultrasound-guided biopsy of the mass in the right breast at 10 o'clock position.  Biopsies are being scheduled for the patient.  I have discussed the findings and recommendations with the patient. Results were also provided in writing at the conclusion of the visit. If applicable, a reminder letter will be sent to the patient regarding the next appointment.  BI-RADS CATEGORY  4: Suspicious.   Electronically Signed   By: Everlean Alstrom M.D.   On: 05/12/2016 13:56   RADIOGRAPHIC STUDIES: I have personally reviewed the radiological images as listed and agreed with the findings in the  report. No results found.  ASSESSMENT & PLAN:   76 year old female with  #1 Stage I (pT1b, pN0, Mx) left sided invasive ductal carcinoma of the breast. 1 cm in size with lymphovascular invasion grade 2/3, Ki-67 15% ER/PR positive HER-2/neu negative. One Sentinel lymph node biopsy negative No DCIS. Severe calcified benign lesions in left breast with PASH (pseudoangiomatous stromal hyperplasia).  Patient is status post left breast lumpectomy by Dr. Ninfa Linden on 07/09/2016. She has healed well from surgery  She completed her adjuvant radiation therapy on 09/2016  #  2 Severe osteoporosis - multiple risk factors including chronic steroid therapy, age, rheumatoid arthritis. Her bone density study shows significant osteoporosis despite being on Fosamax for 2 years. Last vitamin D levels in March were good at 65.1 She has had an osteoporotic left hip fracture around May 2016.  Plan -no prohibitive toxicities from Evista and labs stable-- continue Raloxifene -continue ASA 34m po daly --Patient  scheduled for Prolia q666month -She is currently on vitamin D 5000 units daily. Would continue this and ensure intake of at least 1000 mg of calcium daily. Would recommend keeping her vitamin D level between 60 and 90. -She was recommended to stay physically active and pursue weightbearing exercises . Given referral to cancer rehab   #2 Rheumatoid arthritis currently on Arava and prednisone. Was previously on Orencia. Plan -Continue follow-up with Dr. DeEstanislado Pandyrom rheumatology for ongoing treatments.  Cancer rehab referral Prolia in 3 months on same day as clinic visit (after clinic visit) RTC with Dr KaIrene Limbon 3 months with labs .  I spent 20 minutes counseling the patient face to face. The total time spent in the appointment was 25 minutes and more than 50% was on counseling and direct patient cares.    GaSullivan LoneD MSClydeAHIVMS SCMclaughlin Public Health Service Indian Health CenterTStone County Medical Centerematology/Oncology Physician CoSelect Speciality Hospital Of Fort Myers(Office):       [phone] cell):  [phone]):           [phone]

## 2017-01-24 ENCOUNTER — Ambulatory Visit: Payer: Medicare Other

## 2017-01-24 DIAGNOSIS — R2689 Other abnormalities of gait and mobility: Secondary | ICD-10-CM

## 2017-01-24 DIAGNOSIS — M25661 Stiffness of right knee, not elsewhere classified: Secondary | ICD-10-CM | POA: Diagnosis not present

## 2017-01-24 DIAGNOSIS — M6281 Muscle weakness (generalized): Secondary | ICD-10-CM

## 2017-01-24 DIAGNOSIS — M25561 Pain in right knee: Secondary | ICD-10-CM

## 2017-01-24 DIAGNOSIS — M25562 Pain in left knee: Secondary | ICD-10-CM

## 2017-01-24 DIAGNOSIS — M79621 Pain in right upper arm: Secondary | ICD-10-CM

## 2017-01-24 NOTE — Therapy (Signed)
[place], [place], [address] Phone: [phone]   Fax:  [phone]  Physical Therapy Treatment  Patient Details  Name: Felicia Gonzalez MRN: [id number] Date of Birth: November 20, 1940 Referring Provider: Dr. Sullivan Lone  Encounter Date: 01/24/2017      PT End of Session - 01/24/17 1021    Visit Number 2   Number of Visits 9   Date for PT Re-Evaluation 02/25/17   PT Start Time 0936   PT Stop Time 1018   PT Time Calculation (min) 42 min   Activity Tolerance Patient tolerated treatment well   Behavior During Therapy [facility] for tasks assessed/performed      Past Medical History:  Diagnosis Date  . Anemia   . Arthritis    rheumatoid +CCP neg RF +ANA synovitis   . Asthma   . Cancer [facility])    Breast cancer  . Glaucoma    right eye  . Osteoporosis     Past Surgical History:  Procedure Laterality Date  . BREAST LUMPECTOMY WITH RADIOACTIVE SEED AND SENTINEL LYMPH NODE BIOPSY Left 07/09/2016   Procedure: BREAST LUMPECTOMY WITH RADIOACTIVE SEED AND SENTINEL LYMPH NODE BIOPSY;  Surgeon: Coralie Keens, MD;  Location: [place];  Service: General;  Laterality: Left;  . NO PAST SURGERIES    . TOTAL HIP ARTHROPLASTY Left 10/01/2014   Procedure: TOTAL HIP ARTHROPLASTY;  Surgeon: Garald Balding, MD;  Location: [place];  Service: Orthopedics;  Laterality: Left;    There were no vitals filed for this visit.      Subjective Assessment - 01/24/17 0937    Subjective I've seen my rheumatologist since I was here last and she gave me a cortisone shot in my Rt knee and it feels so much better now. The swelling is also improved.    Pertinent History h/o left THA 10/01/14 because of a fall and a fracture, and then went to rehab at [facility]. Last December a small cancer was found, which was removed 07/09/16.  Left SLNB was negative.  Had radiation and finished 10/04/16. Before that she was feeling a lot better from a medication  (Orencia), but got a bad flu and then cancer, and has been off that medication.  Is now on other medications for this. Referral says this:  "Patient with early stage breast cancer on adjuvant endocrine therapy with fatigue and balance issues. Please evaluate and treatment for strength/endurance/balance and ROM."  Breast cancer was on left side. HTN controlled with meds. Osteoporosis, and gets injections for this every 6 months.   Patient Stated Goals Wants her knees and joints to feel better.   Currently in Pain? No/denies            [facility] PT Assessment - 01/24/17 0001      Strength   Right Hip Extension 3-/5   Right Hip ABduction 4/5   Left Hip Extension 4/5   Left Hip ABduction 4+/5   Right Knee Flexion 4+/5   Left Knee Flexion 5/5     Timed Up and Go Test   Normal TUG (seconds) 11.13   Manual TUG (seconds) 11.66   Cognitive TUG (seconds) 11.42                     [facility] Adult PT Treatment/Exercise - 01/24/17 0001      Self-Care   Self-Care Other Self-Care Comments   Other Self-Care Comments  Educated pt on [employer] and classess they  provide including aerobic and aquatic. She was very interested in learning about this as an option for continuing her rehab and strengthening. Printed out information for pt from the internet.      Knee/Hip Exercises: Aerobic   Stationary Bike Level 1, x 5 minutes     Knee/Hip Exercises: Standing   Heel Raises Both;20 reps  Standing at counter   Hip Flexion Stengthening;Both;10 reps;Knee straight  At counter   Hip Flexion Limitations VC for slow, controlled pace   Hip Abduction Stengthening;Both;10 reps;Knee straight  at counter   Abduction Limitations VC for slow, controlled pace   Hip Extension Stengthening;Both;10 reps;Knee straight  at counter   Extension Limitations VC for slow, controlled pace and correct technique                PT Education - 01/24/17 1014    Education provided Yes   Education  Details Standing hip 3 way raises and heel raises   Person(s) Educated Patient   Methods Explanation;Demonstration;Handout   Comprehension Verbalized understanding;Returned demonstration                [facility] Goals - 01/19/17 1638      CC Long Term Goal  #1   Title Pt. will be independent in home exercise program for UE and LE strengthening.   Time 4   Period Weeks   Status New   Target Date 02/25/17     CC Long Term Goal  #2   Title Pt. will report at least 25% decrease in pain overall.   Time 4   Period Weeks   Status New   Target Date 02/25/17     CC Long Term Goal  #3   Title Pt. will be knowledgeable about community programs available to her for exercise such as [employer] [facility] [employer] and senior centers in town   Time 4   Period Weeks   Status New   Target Date 02/25/17            Plan - 01/24/17 1021    Clinical Impression Statement Pt toelrated first session very well today of a few standing LE exercises which were added to HEP, also instructed pt to try these laying down as well. She verbalized understanding this as she reported used to do these after her THR surgery, has just gotten out of the habit with everything she's had going on as of late. She was very interested to learn about [facility] so issued her information on this and she planned on getting in touch with them soon.   Rehab Potential Good   PT Frequency 2x / week   PT Duration 4 weeks   PT Treatment/Interventions ADLs/Self Care Home Management;Electrical Stimulation;Moist Heat;Cryotherapy;DME Instruction;Gait training;Stair training;Therapeutic activities;Therapeutic exercise;Balance training;Neuromuscular re-education;Patient/family education;Manual techniques;Passive range of motion   PT Next Visit Plan Cont strengthening of LEs and UEs.  Consider starting with SLRs in lying or other exercises involving knee isometrics. Progress to trying lunges and step ups to add to HEP if Rt  knee is still not hurting from recent cortisone shot.    Consulted and Agree with Plan of Care Patient      Patient will benefit from skilled therapeutic intervention in order to improve the following deficits and impairments:  Abnormal gait, Decreased range of motion, Decreased strength, Pain  Visit Diagnosis: Stiffness of right knee, not elsewhere classified  Muscle weakness (generalized)  Right knee pain, unspecified chronicity  Left knee pain, unspecified chronicity  Other abnormalities of gait and mobility  Pain in right upper arm     Problem List Patient Active Problem List   Diagnosis Date Noted  . Malignant neoplasm of lower-inner quadrant of left breast in female, estrogen receptor positive ([place]) 10/04/2016  . Osteoporosis 10/04/2016  . Malignant neoplasm of upper-inner quadrant of left breast in female, estrogen receptor positive ([place]) 08/26/2016  . High risk medication use 06/08/2016  . Age-related osteoporosis without current pathological fracture 06/08/2016  . ANA positive 06/08/2016  . Moderate persistent asthma 06/08/2016  . Normocytic anemia 10/01/2014  . Rheumatoid arthritis of multiple sites without organ or system involvement with positive rheumatoid factor ([place]) 10/01/2014  . Subcapital fracture of left hip ([place]) 10/01/2014    Otelia Limes, PTA 01/24/2017, 10:26 AM  [place], [place], [address] Phone: [phone]   Fax:  [phone]  Name: Felicia Gonzalez MRN: [id number] Date of Birth: [birth year]

## 2017-01-24 NOTE — Patient Instructions (Signed)
Flexion and Extension - Standing    Stand and support self while swinging one leg forward SLOWLY and controlled __10-20__ times, then  backward _10-20__ times. Repeat with other leg. Do _2-3__ times per day.   Hip Abduction (Standing)    Stand with support. Squeeze pelvic floor and hold. Lift right leg out to side, keeping toe forward.  Repeat _10-20__ times. Do _2-3__ times a day. Repeat with other leg.    Heel Raises    Stand with support. Tighten pelvic floor and hold. With knees straight, raise heels off ground. Repeat _20__ times. Do _2-3__ times a day   Cancer Rehab [phone]

## 2017-02-01 ENCOUNTER — Other Ambulatory Visit: Payer: Self-pay | Admitting: Rheumatology

## 2017-02-01 NOTE — Telephone Encounter (Addendum)
Patient is also requesting a refill on Prednisone   Last Visit: 01/20/17 Next Visit: 06/15/17 Labs: 01/17/17 AST 36 Previously normal, Hgb 9.9 Previously 10.3  PLQ Eye Exam: 11/2016 WNL  Okay to refill  Per Dr. Estanislado Pandy

## 2017-02-02 ENCOUNTER — Ambulatory Visit: Payer: Medicare Other | Attending: Hematology | Admitting: Physical Therapy

## 2017-02-02 DIAGNOSIS — M25562 Pain in left knee: Secondary | ICD-10-CM | POA: Diagnosis present

## 2017-02-02 DIAGNOSIS — R2689 Other abnormalities of gait and mobility: Secondary | ICD-10-CM | POA: Diagnosis present

## 2017-02-02 DIAGNOSIS — M25561 Pain in right knee: Secondary | ICD-10-CM | POA: Insufficient documentation

## 2017-02-02 DIAGNOSIS — M25661 Stiffness of right knee, not elsewhere classified: Secondary | ICD-10-CM

## 2017-02-02 DIAGNOSIS — M79621 Pain in right upper arm: Secondary | ICD-10-CM | POA: Diagnosis present

## 2017-02-02 DIAGNOSIS — M6281 Muscle weakness (generalized): Secondary | ICD-10-CM | POA: Diagnosis present

## 2017-02-02 MED ORDER — PREDNISONE 5 MG PO TABS: 5.0000 mg | ORAL_TABLET | Freq: Every day | ORAL | 1 refills | Status: DC

## 2017-02-02 NOTE — Therapy (Signed)
[place], [place], [address] Phone: [phone]   Fax:  [phone]  Physical Therapy Treatment  Patient Details  Name: Felicia Gonzalez MRN: [id number] Date of Birth: [birth year] Referring Provider: Dr. Sullivan Lone  Encounter Date: 02/02/2017      PT End of Session - 02/02/17 1523    Visit Number 3   Number of Visits 9   Date for PT Re-Evaluation 02/25/17   PT Start Time 3662   PT Stop Time 1530   PT Time Calculation (min) 58 min   Activity Tolerance Patient tolerated treatment well   Behavior During Therapy [facility] for tasks assessed/performed      Past Medical History:  Diagnosis Date  . Anemia   . Arthritis    rheumatoid +CCP neg RF +ANA synovitis   . Asthma   . Cancer [facility])    Breast cancer  . Glaucoma    right eye  . Osteoporosis     Past Surgical History:  Procedure Laterality Date  . BREAST LUMPECTOMY WITH RADIOACTIVE SEED AND SENTINEL LYMPH NODE BIOPSY Left 07/09/2016   Procedure: BREAST LUMPECTOMY WITH RADIOACTIVE SEED AND SENTINEL LYMPH NODE BIOPSY;  Surgeon: Coralie Keens, MD;  Location: [place];  Service: General;  Laterality: Left;  . NO PAST SURGERIES    . TOTAL HIP ARTHROPLASTY Left 10/01/2014   Procedure: TOTAL HIP ARTHROPLASTY;  Surgeon: Garald Balding, MD;  Location: [place];  Service: Orthopedics;  Laterality: Left;    There were no vitals filed for this visit.      Subjective Assessment - 02/02/17 1435    Subjective Last time after I got home my left knee was bothering and I had pain for two days; the right foot also hurt.  The left foot hurt. For 2-3 days I coudn't do any exercise. Then did some housework and some walking.  Has arthritis patches from [place] on both knees today. She says that cold sometimes hurts, so she may not do well with cold packs, but is willing to try.   Pertinent History h/o left THA 10/01/14 because of a fall and a fracture, and then went to rehab at  [facility] Last December a small cancer was found, which was removed 07/09/16.  Left SLNB was negative.  Had radiation and finished 10/04/16. Before that she was feeling a lot better from a medication (Orencia), but got a bad flu and then cancer, and has been off that medication.  Is now on other medications for this. Referral says this:  "Patient with early stage breast cancer on adjuvant endocrine therapy with fatigue and balance issues. Please evaluate and treatment for strength/endurance/balance and ROM."  Breast cancer was on left side. HTN controlled with meds. Osteoporosis, and gets injections for this every 6 months.   Currently in Pain? No/denies  sometimes the right arm hurts with movement                         [facility] Adult PT Treatment/Exercise - 02/02/17 0001      Knee/Hip Exercises: Supine   Quad Sets Strengthening;Right;Left;1 set;10 reps  breathe in first, set quads as breathe out x10, Rt, then Lt   Other Supine Knee/Hip Exercises Gluteal sets: inhale through nose first, set gluteals and hold while exhaling slowly, x 10.     Shoulder Exercises: Supine   Other Supine Exercises Meeks' decompression exercises done 5x each, held for 3 counts:  isometric shouder  extension, isometric neck extension, leg lengthener, and isometric hip extension.     Modalities   Modalities Moist Heat     Moist Heat Therapy   Number Minutes Moist Heat 15 Minutes   Moist Heat Location Knee  both                PT Education - 02/02/17 1523    Education provided Yes   Education Details Meeks' decompression exercises   Person(s) Educated Patient   Methods Explanation;Demonstration;Verbal cues;Handout   Comprehension Verbalized understanding;Returned demonstration                [facility] Goals - 01/19/17 1638      CC Long Term Goal  #1   Title Pt. will be independent in home exercise program for UE and LE strengthening.   Time 4   Period Weeks   Status  New   Target Date 02/25/17     CC Long Term Goal  #2   Title Pt. will report at least 25% decrease in pain overall.   Time 4   Period Weeks   Status New   Target Date 02/25/17     CC Long Term Goal  #3   Title Pt. will be knowledgeable about community programs available to her for exercise such as [employer] [facility] [employer] and senior centers in town   Time 4   Period Weeks   Status New   Target Date 02/25/17            Plan - 02/02/17 1523    Clinical Impression Statement Because patient reported having increased pain for a couple of days after last session, focused on gentle isometrics today for neck, shoulders, and LEs.  Used heat to knees at end of session for comfort. Pt. was instructed to do these only for a couple of days (if not painful after today); if no pain from these, add back in the standing SLRs and heel raises from last session on Saturday, but stop if pain returns.   Rehab Potential Good   PT Frequency 2x / week   PT Duration 4 weeks   PT Treatment/Interventions ADLs/Self Care Home Management;Electrical Stimulation;Moist Heat;Cryotherapy;DME Instruction;Gait training;Stair training;Therapeutic activities;Therapeutic exercise;Balance training;Neuromuscular re-education;Patient/family education;Manual techniques;Passive range of motion   PT Next Visit Plan See how she responded to isometrics and if she was able to tolerate adding standing SLRs and heel raises back in. Progress from there depending on her tolerance of these: continue isometrics if movement caused increased pain.  Continue heat to knees or other sore joints at end of session prn.   Consulted and Agree with Plan of Care Patient      Patient will benefit from skilled therapeutic intervention in order to improve the following deficits and impairments:  Abnormal gait, Decreased range of motion, Decreased strength, Pain  Visit Diagnosis: Stiffness of right knee, not elsewhere classified  Muscle weakness  (generalized)  Right knee pain, unspecified chronicity  Left knee pain, unspecified chronicity     Problem List Patient Active Problem List   Diagnosis Date Noted  . Malignant neoplasm of lower-inner quadrant of left breast in female, estrogen receptor positive ([place]) 10/04/2016  . Osteoporosis 10/04/2016  . Malignant neoplasm of upper-inner quadrant of left breast in female, estrogen receptor positive ([place]) 08/26/2016  . High risk medication use 06/08/2016  . Age-related osteoporosis without current pathological fracture 06/08/2016  . ANA positive 06/08/2016  . Moderate persistent asthma 06/08/2016  . Normocytic anemia 10/01/2014  .  Rheumatoid arthritis of multiple sites without organ or system involvement with positive rheumatoid factor ([place]) 10/01/2014  . Subcapital fracture of left hip ([place]) 10/01/2014    Hazel Wrinkle 02/02/2017, 3:27 PM  [place], [place], [address] Phone: [phone]   Fax:  [phone]  Name: Felicia Gonzalez MRN: [id number] Date of Birth: 22-Aug-1940  Serafina Royals, PT 02/02/17 3:33 PM

## 2017-02-02 NOTE — Patient Instructions (Addendum)
1. Decompression Exercise     Cancer Rehab [phone]    Lie on back on firm surface, knees bent, feet flat, arms turned up, out to sides, backs of hands down. Time _5__ minutes. Surface: bed   2. Shoulder Press    Start in Decompression Exercise position. Press shoulders downward towards supporting surface. Hold __2-3__ seconds while counting out loud. Repeat _5___ times. Do _1-2___ times per day.   3. Head Press    Bring cervical spine (neck) into neutral position (by either tucking the chin towards the chest or tilting the chin upward). Feel weight on back of head. Press head downward into supporting surface.    Hold _2-3__ seconds. Repeat _5__ times. Do _1-2__ times per day.   4. Leg Lengthener    Straighten one leg. Pull toes AND forefoot toward knee, extend heel. Lengthen leg by pulling pelvis away from ribs. Hold _2-3__ seconds. Relax. Repeat __5__ times. Do other leg.  Surface: floor   5. Leg Press    Straighten one leg down to floor keeping leg aligned with hip. Pull toes AND forefoot toward knee; extend heel.  Press entire leg downward (as if pressing leg into sandy beach). DO NOT BEND KNEE. Hold _2-3__ seconds. Do __5__ times. Repeat with other leg.

## 2017-02-07 ENCOUNTER — Ambulatory Visit: Payer: Medicare Other | Admitting: Physical Therapy

## 2017-02-07 DIAGNOSIS — M25562 Pain in left knee: Secondary | ICD-10-CM

## 2017-02-07 DIAGNOSIS — M6281 Muscle weakness (generalized): Secondary | ICD-10-CM

## 2017-02-07 DIAGNOSIS — M25661 Stiffness of right knee, not elsewhere classified: Secondary | ICD-10-CM | POA: Diagnosis not present

## 2017-02-07 DIAGNOSIS — M25561 Pain in right knee: Secondary | ICD-10-CM

## 2017-02-07 NOTE — Patient Instructions (Addendum)
Gluteal Sets    Breathe in first.  As you exhale, squeeze your buttocks muscles and hold. Hold for _3+__ seconds. Relax for __2_ seconds. Repeat __10_ times. Do _1__ times a day.   Copyright  VHI. All rights reserved.   Quad Sets    Breathe in first. As you exhale, tighten top of left thigh. Hold for _3+__ seconds. Relax for __2_ seconds. Repeat _10__ times. Do _1__ times a day. Repeat with other leg.   Strengthening: Hip Abductor - Resisted    You may lie down with your head resting on a pillow (not like in picture). With wide belt (not stretchy band) looped around both legs above knees, push thighs apart against the resistance of the belt, as you exhale. Repeat __10__ times per set. Do _1___ sets per session. Do __1__ sessions per day.   **IN SAME POSITION AS ABOVE, PLACE A PILLOW BETWEEN YOUR KNEES.  SQUEEZE AND HOLD FOR 3 COUNTS.  DO 10 REPS.    Hamstring Stretch    SIT TOWARD THE EDGE OF A CHAIR OR BED, NOT A BALL. Inhale and straighten spine. Exhale and lean forward toward extended leg, keeping your back straight and leading with your chest. Hold position for _10__ counts. Inhale and come back to center. Do three times each side. Repeat with other leg extended. Do _1-2__ times per day.  Copyright  VHI. All rights reserved.   http://orth.exer.us/689   Copyright  VHI. All rights reserved.       Copyright  VHI. All rights reserved.

## 2017-02-07 NOTE — Therapy (Signed)
[place], [place], [address] Phone: [phone]   Fax:  [phone]  Physical Therapy Treatment  Patient Details  Name: Felicia Gonzalez MRN: [id number] Date of Birth: Sep 14, 1940 Referring Provider: Dr. Sullivan Lone  Encounter Date: 02/07/2017      PT End of Session - 02/07/17 2023    Visit Number 4   Number of Visits 9   Date for PT Re-Evaluation 02/25/17   PT Start Time 7616   PT Stop Time 1435   PT Time Calculation (min) 47 min   Activity Tolerance Patient tolerated treatment well   Behavior During Therapy [facility] for tasks assessed/performed      Past Medical History:  Diagnosis Date  . Anemia   . Arthritis    rheumatoid +CCP neg RF +ANA synovitis   . Asthma   . Cancer [facility])    Breast cancer  . Glaucoma    right eye  . Osteoporosis     Past Surgical History:  Procedure Laterality Date  . BREAST LUMPECTOMY WITH RADIOACTIVE SEED AND SENTINEL LYMPH NODE BIOPSY Left 07/09/2016   Procedure: BREAST LUMPECTOMY WITH RADIOACTIVE SEED AND SENTINEL LYMPH NODE BIOPSY;  Surgeon: Coralie Keens, MD;  Location: [place];  Service: General;  Laterality: Left;  . NO PAST SURGERIES    . TOTAL HIP ARTHROPLASTY Left 10/01/2014   Procedure: TOTAL HIP ARTHROPLASTY;  Surgeon: Garald Balding, MD;  Location: [place];  Service: Orthopedics;  Laterality: Left;    There were no vitals filed for this visit.      Subjective Assessment - 02/07/17 1350    Subjective I think the exercises you gave me were good, but on Saturday I tried the other ones and they hurt my knee. Arm was feeling better after the exercise last time.   Currently in Pain? Yes   Pain Score 1    Pain Location Neck   Pain Orientation Right;Posterior   Aggravating Factors  sleeping wrong and/or damp weather   Pain Relieving Factors stretch it                         [facility] Adult PT Treatment/Exercise - 02/07/17 0001      Knee/Hip  Exercises: Seated   Other Seated Knee/Hip Exercises hamstring stretches held 10 counts x 3 each side     Knee/Hip Exercises: Supine   Quad Sets Strengthening;Right;Left;1 set  breathe in first, set quads as breathe out x10, 3x2 reps   Hip Adduction Isometric Both;Strengthening;10 reps  squeezing small football, hold 3 counts and breathe, hooklie   Knee Flexion --  failed trials of several isometric set-ups today   Other Supine Knee/Hip Exercises Gluteal sets: inhale through nose first, set gluteals and hold while exhaling slowly, x 10.   Other Supine Knee/Hip Exercises hooklying hip abductor isometrics against gait belt, 3 counts x 10     Shoulder Exercises: Supine   Other Supine Exercises Meeks' decompression exercises done 2x each for review, held for 3 counts:  isometric shouder extension, isometric neck extension, leg lengthener, and isometric hip extension.                PT Education - 02/07/17 2022    Education provided Yes   Education Details quad and gluteal sets; isometric hip ab- and adductor strengthening, hamstring stretches   Person(s) Educated Patient   Methods Explanation;Verbal cues;Handout   Comprehension Verbalized understanding;Returned demonstration  [facility] Goals - 01/19/17 1638      CC Long Term Goal  #1   Title Pt. will be independent in home exercise program for UE and LE strengthening.   Time 4   Period Weeks   Status New   Target Date 02/25/17     CC Long Term Goal  #2   Title Pt. will report at least 25% decrease in pain overall.   Time 4   Period Weeks   Status New   Target Date 02/25/17     CC Long Term Goal  #3   Title Pt. will be knowledgeable about community programs available to her for exercise such as [employer] [facility] [employer] and senior centers in town   Time 4   Period Weeks   Status New   Target Date 02/25/17            Plan - 02/07/17 2023    Clinical Impression Statement Patient has  tolerated isometric exercises but not others, so was encouraged to stick with just isometrics.  Some were added today to her HEP.  Had difficulty figuring out a hamstring isometric position that would work for her, so that needs to be added.   Rehab Potential Good   PT Frequency 2x / week   PT Duration 4 weeks   PT Treatment/Interventions ADLs/Self Care Home Management;Electrical Stimulation;Moist Heat;Cryotherapy;DME Instruction;Gait training;Stair training;Therapeutic activities;Therapeutic exercise;Balance training;Neuromuscular re-education;Patient/family education;Manual techniques;Passive range of motion   PT Next Visit Plan Check goals.  Review HEP given today. Add hamstring isometrics to HEP. Continue heat to knees or other sore joints at end of session prn.   PT Home Exercise Plan continue Meeks' decompression; add quad and glut sets, hip ab- and adductor isometrics, and hamstring stretches   Consulted and Agree with Plan of Care Patient      Patient will benefit from skilled therapeutic intervention in order to improve the following deficits and impairments:  Abnormal gait, Decreased range of motion, Decreased strength, Pain  Visit Diagnosis: Stiffness of right knee, not elsewhere classified  Muscle weakness (generalized)  Right knee pain, unspecified chronicity  Left knee pain, unspecified chronicity     Problem List Patient Active Problem List   Diagnosis Date Noted  . Malignant neoplasm of lower-inner quadrant of left breast in female, estrogen receptor positive ([place]) 10/04/2016  . Osteoporosis 10/04/2016  . Malignant neoplasm of upper-inner quadrant of left breast in female, estrogen receptor positive ([place]) 08/26/2016  . High risk medication use 06/08/2016  . Age-related osteoporosis without current pathological fracture 06/08/2016  . ANA positive 06/08/2016  . Moderate persistent asthma 06/08/2016  . Normocytic anemia 10/01/2014  . Rheumatoid arthritis of multiple  sites without organ or system involvement with positive rheumatoid factor ([place]) 10/01/2014  . Subcapital fracture of left hip ([place]) 10/01/2014    Juana Montini 02/07/2017, 8:28 PM  [place], [place], [address] Phone: [phone]   Fax:  [phone]  Name: YOANNA JURCZYK MRN: [id number] Date of Birth: 10/01/1940  Serafina Royals, PT 02/07/17 8:28 PM

## 2017-02-09 ENCOUNTER — Ambulatory Visit: Payer: Medicare Other | Admitting: Rheumatology

## 2017-02-14 ENCOUNTER — Ambulatory Visit: Payer: Medicare Other

## 2017-02-14 DIAGNOSIS — M25561 Pain in right knee: Secondary | ICD-10-CM

## 2017-02-14 DIAGNOSIS — M25661 Stiffness of right knee, not elsewhere classified: Secondary | ICD-10-CM

## 2017-02-14 DIAGNOSIS — R2689 Other abnormalities of gait and mobility: Secondary | ICD-10-CM

## 2017-02-14 DIAGNOSIS — M25562 Pain in left knee: Secondary | ICD-10-CM

## 2017-02-14 DIAGNOSIS — M6281 Muscle weakness (generalized): Secondary | ICD-10-CM

## 2017-02-14 NOTE — Therapy (Signed)
[place], [place], [address] Phone: [phone]   Fax:  [phone]  Physical Therapy Treatment  Patient Details  Name: Felicia Gonzalez MRN: [id number] Date of Birth: 1940/10/17 Referring Provider: Dr. Sullivan Lone  Encounter Date: 02/14/2017      PT End of Session - 02/14/17 1055    Visit Number 5   Number of Visits 9   Date for PT Re-Evaluation 02/25/17   PT Start Time 1022   PT Stop Time 1115  Last 15 mins were no charge due to moist heat   PT Time Calculation (min) 53 min   Activity Tolerance Patient tolerated treatment well   Behavior During Therapy [facility] for tasks assessed/performed      Past Medical History:  Diagnosis Date  . Anemia   . Arthritis    rheumatoid +CCP neg RF +ANA synovitis   . Asthma   . Cancer [facility])    Breast cancer  . Glaucoma    right eye  . Osteoporosis     Past Surgical History:  Procedure Laterality Date  . BREAST LUMPECTOMY WITH RADIOACTIVE SEED AND SENTINEL LYMPH NODE BIOPSY Left 07/09/2016   Procedure: BREAST LUMPECTOMY WITH RADIOACTIVE SEED AND SENTINEL LYMPH NODE BIOPSY;  Surgeon: Coralie Keens, MD;  Location: [place];  Service: General;  Laterality: Left;  . NO PAST SURGERIES    . TOTAL HIP ARTHROPLASTY Left 10/01/2014   Procedure: TOTAL HIP ARTHROPLASTY;  Surgeon: Garald Balding, MD;  Location: [place];  Service: Orthopedics;  Laterality: Left;    There were no vitals filed for this visit.      Subjective Assessment - 02/14/17 1025    Subjective I have been doing the exercises I was given last time and they have helped, my Lt knee is feeling better. I'm not feeling as often when I'm walking around my houses doing chores and other activities. I have been using the abdominal binder I got from surgery for the clam exercise and that felt good.    Pertinent History h/o left THA 10/01/14 because of a fall and a fracture, and then went to rehab at [facility]. Last  December a small cancer was found, which was removed 07/09/16.  Left SLNB was negative.  Had radiation and finished 10/04/16. Before that she was feeling a lot better from a medication (Orencia), but got a bad flu and then cancer, and has been off that medication.  Is now on other medications for this. Referral says this:  "Patient with early stage breast cancer on adjuvant endocrine therapy with fatigue and balance issues. Please evaluate and treatment for strength/endurance/balance and ROM."  Breast cancer was on left side. HTN controlled with meds. Osteoporosis, and gets injections for this every 6 months.   Patient Stated Goals Wants her knees and joints to feel better.   Currently in Pain? Yes   Pain Score 1   less than a 1   Pain Location Knee   Pain Orientation Left   Pain Descriptors / Indicators Dull   Pain Onset More than a month ago   Pain Frequency Intermittent   Aggravating Factors  not much now   Pain Relieving Factors the new exercises                         [facility] Adult PT Treatment/Exercise - 02/14/17 0001      Knee/Hip Exercises: Seated   Clamshell with TheraBand Yellow  3  x 10   Other Seated Knee/Hip Exercises hamstring stretches held 20 counts x 3 each side; tried piriformis stretch but pt reported Lt hip too tight for this position so stopped     Knee/Hip Exercises: Supine   Quad Sets Strengthening;Right;Left;2 sets;10 reps  inhale, then exhale with quad set   Hip Adduction Isometric Both;Strengthening;10 reps  squeezing bolster; hooklying with inhale, then exhale & set    Straight Leg Raises Strengthening;Right;Left;2 sets;5 reps   Knee Flexion Strengthening;Right;Left;1 set;10 reps  Heel press into mat with knee flexed ~45 degrees   Other Supine Knee/Hip Exercises Gluteal sets: inhale through nose first, set gluteals and hold while exhaling slowly, x 10.   Other Supine Knee/Hip Exercises Lt piriformis stretch in figure position which pt reported was  good stretch so just left ankle on Rt knee with pt holding ankle 3 x 20 second holds                        [facility] Goals - 01/19/17 1638      CC Long Term Goal  #1   Title Pt. will be independent in home exercise program for UE and LE strengthening.   Time 4   Period Weeks   Status New   Target Date 02/25/17     CC Long Term Goal  #2   Title Pt. will report at least 25% decrease in pain overall.   Time 4   Period Weeks   Status New   Target Date 02/25/17     CC Long Term Goal  #3   Title Pt. will be knowledgeable about community programs available to her for exercise such as [employer] at the [employer] and senior centers in town   Time 4   Period Weeks   Status New   Target Date 02/25/17            Plan - 02/14/17 1056    Clinical Impression Statement Continued with isometric exercises today which pt tolerated very well, so added new isometric with knee flexion and then gentle progression of straight leg raise which pt tolerated with no c/o pain. Continued to end session with heat as pt reported this feeling good.    Rehab Potential Good   PT Frequency 2x / week   PT Duration 4 weeks   PT Treatment/Interventions ADLs/Self Care Home Management;Electrical Stimulation;Moist Heat;Cryotherapy;DME Instruction;Gait training;Stair training;Therapeutic activities;Therapeutic exercise;Balance training;Neuromuscular re-education;Patient/family education;Manual techniques;Passive range of motion   PT Next Visit Plan Check goals.  Review HEP given today. Add hamstring isometrics to HEP. Continue heat to knees or other sore joints at end of session prn.   Consulted and Agree with Plan of Care Patient      Patient will benefit from skilled therapeutic intervention in order to improve the following deficits and impairments:  Abnormal gait, Decreased range of motion, Decreased strength, Pain  Visit Diagnosis: Stiffness of right knee, not elsewhere classified  Muscle  weakness (generalized)  Right knee pain, unspecified chronicity  Left knee pain, unspecified chronicity  Other abnormalities of gait and mobility     Problem List Patient Active Problem List   Diagnosis Date Noted  . Malignant neoplasm of lower-inner quadrant of left breast in female, estrogen receptor positive ([place]) 10/04/2016  . Osteoporosis 10/04/2016  . Malignant neoplasm of upper-inner quadrant of left breast in female, estrogen receptor positive ([place]) 08/26/2016  . High risk medication use 06/08/2016  . Age-related osteoporosis without current pathological fracture 06/08/2016  .  ANA positive 06/08/2016  . Moderate persistent asthma 06/08/2016  . Normocytic anemia 10/01/2014  . Rheumatoid arthritis of multiple sites without organ or system involvement with positive rheumatoid factor ([place]) 10/01/2014  . Subcapital fracture of left hip ([place]) 10/01/2014    Otelia Limes, PTA 02/14/2017, 12:19 PM  [place], [place], [address] Phone: [phone]   Fax:  [phone]  Name: NEDDIE STEEDMAN MRN: [id number] Date of Birth: 10-Feb-1941

## 2017-02-16 ENCOUNTER — Ambulatory Visit: Payer: Medicare Other

## 2017-02-16 DIAGNOSIS — R2689 Other abnormalities of gait and mobility: Secondary | ICD-10-CM

## 2017-02-16 DIAGNOSIS — M25561 Pain in right knee: Secondary | ICD-10-CM

## 2017-02-16 DIAGNOSIS — M25562 Pain in left knee: Secondary | ICD-10-CM

## 2017-02-16 DIAGNOSIS — M6281 Muscle weakness (generalized): Secondary | ICD-10-CM

## 2017-02-16 DIAGNOSIS — M25661 Stiffness of right knee, not elsewhere classified: Secondary | ICD-10-CM

## 2017-02-16 NOTE — Patient Instructions (Signed)
Leg Raise: Straight - Hook-Lying (Single Leg)    Reclined sitting, one leg straight, with arm support, anchor tubing around both feet. Lift straight leg. Repeat _10_ times per set. Repeat with other leg. Do _1_ sets per session, 1-2 times/day.   Piriformis Stretch, Supine    Lie supine, legs bent, feet flat. Raise one bent leg onto ankle, grasping ankle with one hand and trying to push knee away from body. Hold __20_ seconds.  Repeat _3__ times per session. Do _2-3__ sessions per day.  Bridge    Lying on back, legs bent 90, feet flat on floor. Press up hips and torso, reaching hands to feet. Hold for __3__ seconds. ADVANCED: Clasp hands over stomach or cross at chest.  Cancer Rehab [phone]

## 2017-02-16 NOTE — Therapy (Signed)
[place], [place], [address] Phone: [phone]   Fax:  [phone]  Physical Therapy Treatment  Patient Details  Name: Felicia Gonzalez MRN: [id number] Date of Birth: 08/15/1940 Referring Provider: Dr. Sullivan Lone  Encounter Date: 02/16/2017      PT End of Session - 02/16/17 1442    Visit Number 6   Number of Visits 9   Date for PT Re-Evaluation 02/25/17   PT Start Time 2992   PT Stop Time 1447  3 units is correct due to ending with moist heat   PT Time Calculation (min) 55 min   Activity Tolerance Patient tolerated treatment well   Behavior During Therapy [facility] for tasks assessed/performed      Past Medical History:  Diagnosis Date  . Anemia   . Arthritis    rheumatoid +CCP neg RF +ANA synovitis   . Asthma   . Cancer [facility])    Breast cancer  . Glaucoma    right eye  . Osteoporosis     Past Surgical History:  Procedure Laterality Date  . BREAST LUMPECTOMY WITH RADIOACTIVE SEED AND SENTINEL LYMPH NODE BIOPSY Left 07/09/2016   Procedure: BREAST LUMPECTOMY WITH RADIOACTIVE SEED AND SENTINEL LYMPH NODE BIOPSY;  Surgeon: Coralie Keens, MD;  Location: [place];  Service: General;  Laterality: Left;  . NO PAST SURGERIES    . TOTAL HIP ARTHROPLASTY Left 10/01/2014   Procedure: TOTAL HIP ARTHROPLASTY;  Surgeon: Garald Balding, MD;  Location: [place];  Service: Orthopedics;  Laterality: Left;    There were no vitals filed for this visit.      Subjective Assessment - 02/16/17 1359    Subjective I felt good after last session, my knees weren't hurting more. I keep gettin gbetter. I just have some discomfort in my knees today.    Pertinent History h/o left THA 10/01/14 because of a fall and a fracture, and then went to rehab at [facility]. Last December a small cancer was found, which was removed 07/09/16.  Left SLNB was negative.  Had radiation and finished 10/04/16. Before that she was feeling a lot better  from a medication (Orencia), but got a bad flu and then cancer, and has been off that medication.  Is now on other medications for this. Referral says this:  "Patient with early stage breast cancer on adjuvant endocrine therapy with fatigue and balance issues. Please evaluate and treatment for strength/endurance/balance and ROM."  Breast cancer was on left side. HTN controlled with meds. Osteoporosis, and gets injections for this every 6 months.   Patient Stated Goals Wants her knees and joints to feel better.   Currently in Pain? No/denies                         [facility] Adult PT Treatment/Exercise - 02/16/17 0001      Knee/Hip Exercises: Aerobic   Nustep Level 5, 6 minutes     Knee/Hip Exercises: Machines for Strengthening   Total Gym Leg Press 25# x10  Batca Machine     Knee/Hip Exercises: Standing   Forward Step Up Right;Left;2 sets;5 reps;Hand Hold: 1;Step Height: 4"  3 sec hold on step     Knee/Hip Exercises: Seated   Long Arc Quad Strengthening;Right;Left;10 reps  5 sec holds     Knee/Hip Exercises: Supine   Short Arc Quad Sets Strengthening;Right;Left;5 reps   Bridges Limitations Bridge x10, 4 sec holds with hands crossed on  stomach   Straight Leg Raises Strengthening;Right;Left;10 reps  3 sec holds     Knee/Hip Exercises: Sidelying   Hip ABduction Strengthening;Right;Left;10 reps   Hip ABduction Limitations Only 5 on Rt due to hip pain in Lt with laying on it from THR     Moist Heat Therapy   Number Minutes Moist Heat 15 Minutes   Moist Heat Location Knee  bil                        Long Term Clinic Goals - 02/16/17 1446      CC Long Term Goal  #1   Title Pt. will be independent in home exercise program for UE and LE strengthening.   Status Partially Met     CC Long Term Goal  #2   Title Pt. will report at least 25% decrease in pain overall.   Baseline 60-70% improvement reported at this time-02/16/17   Status Achieved     CC  Long Term Goal  #3   Title Pt. will be knowledgeable about community programs available to her for exercise such as [employer] and senior centers in town   Status On-going            Plan - 02/16/17 1444    Clinical Impression Statement Pt tolerated progression of exercises well today to include NuStep and leg press. She reports continuing to notice improvement without increased pain after session.   Rehab Potential Good   PT Frequency 2x / week   PT Duration 4 weeks   PT Treatment/Interventions ADLs/Self Care Home Management;Electrical Stimulation;Moist Heat;Cryotherapy;DME Instruction;Gait training;Stair training;Therapeutic activities;Therapeutic exercise;Balance training;Neuromuscular re-education;Patient/family education;Manual techniques;Passive range of motion   PT Next Visit Plan Review HEP given today. Add hamstring isometrics to HEP. Continue heat to knees or other sore joints at end of session prn.   Consulted and Agree with Plan of Care Patient      Patient will benefit from skilled therapeutic intervention in order to improve the following deficits and impairments:  Abnormal gait, Decreased range of motion, Decreased strength, Pain  Visit Diagnosis: Stiffness of right knee, not elsewhere classified  Muscle weakness (generalized)  Right knee pain, unspecified chronicity  Left knee pain, unspecified chronicity  Other abnormalities of gait and mobility     Problem List Patient Active Problem List   Diagnosis Date Noted  . Malignant neoplasm of lower-inner quadrant of left breast in female, estrogen receptor positive ([place]) 10/04/2016  . Osteoporosis 10/04/2016  . Malignant neoplasm of upper-inner quadrant of left breast in female, estrogen receptor positive ([place]) 08/26/2016  . High risk medication use 06/08/2016  . Age-related osteoporosis without current pathological fracture 06/08/2016  . ANA positive 06/08/2016  . Moderate persistent asthma  06/08/2016  . Normocytic anemia 10/01/2014  . Rheumatoid arthritis of multiple sites without organ or system involvement with positive rheumatoid factor ([place]) 10/01/2014  . Subcapital fracture of left hip ([place]) 10/01/2014    Otelia Limes, PTA 02/16/2017, 2:52 PM  [place], [place], [address] Phone: [phone]   Fax:  [phone]  Name: Felicia Gonzalez MRN: [id number] Date of Birth: 1940-10-09

## 2017-02-17 ENCOUNTER — Telehealth: Payer: Self-pay

## 2017-02-17 ENCOUNTER — Other Ambulatory Visit: Payer: Self-pay

## 2017-02-17 MED ORDER — RALOXIFENE HCL 60 MG PO TABS: 60.0000 mg | ORAL_TABLET | Freq: Every day | ORAL | 1 refills | Status: DC

## 2017-02-17 NOTE — Telephone Encounter (Signed)
Pt son-law (Dr. Janese Banks) called earlier today [phone]). Called this morning regarding medication for rheumatoid arthritis called "orencia". Pt wondering if okay to restart taking this medication. Additionally, requested refill of a medication, but could not understand what was said on the VM. This RN was in the process of calling back this evening when pt called. Raloxifene was refilled.  Dr. Irene Limbo told me in conversation earlier that orencia should be discussed with Dr. Estanislado Pandy, patient's rheumatologist. Pt explained to me that Dr. Estanislado Pandy wanted an okay from Dr. Irene Limbo for the pt to restart this medication.   Dr. Estanislado Pandy is with [facility] on [address] Contact number for office [phone]. Message sent to desk RN and Dr. Irene Limbo to f/u tomorrow.

## 2017-02-18 ENCOUNTER — Telehealth: Payer: Self-pay | Admitting: *Deleted

## 2017-02-18 NOTE — Telephone Encounter (Signed)
Ok to restart Orencia IV

## 2017-02-18 NOTE — Telephone Encounter (Signed)
Spoke with the nurse who works with Dr. Irene Limbo at the [facility]. Patient saw Dr. Irene Limbo and it is okay to restart the patient on Orencia

## 2017-02-18 NOTE — Telephone Encounter (Signed)
Per staff message, notified pt ok to restart orencia.  Pt verbalized understanding. Dr. Anette Guarneri nurse notified.

## 2017-02-18 NOTE — Telephone Encounter (Signed)
-----   Message from [name], MD sent at 02/17/2017  6:51 PM EDT ----- Regarding: RE: Needs Your Blessing Would be okay to restart if any infectious concerns have resolved. thx GK  ----- Message ----- From: Johann Capers, RN Sent: 02/17/2017   6:23 PM To: Arty Baumgartner, RN, [name], MD Subject: Needs Your Blessing                            Talked to pt this evening about orencia. She said the Dr. Estanislado Pandy just wants your blessing for her to restart after stopping d/t antibiotic treatment. Pt did not express that the doctor had any concern about side effects, so I'm not sure the real reason for your okay, but if it's okay to restart let Yanelie Abraha know.  Dr. Estanislado Pandy - [facility] on [address] [phone]

## 2017-02-21 ENCOUNTER — Other Ambulatory Visit: Payer: Self-pay | Admitting: Rheumatology

## 2017-02-21 ENCOUNTER — Encounter: Payer: Medicare Other | Admitting: Physical Therapy

## 2017-02-21 DIAGNOSIS — M0579 Rheumatoid arthritis with rheumatoid factor of multiple sites without organ or systems involvement: Secondary | ICD-10-CM

## 2017-02-21 NOTE — Telephone Encounter (Signed)
Patient advised okay to restart Orencia. Orders placed.

## 2017-02-21 NOTE — Progress Notes (Signed)
Infusion orders updated today including CBC CMP Tylenol and Benadryl appointments are up to date and a follow up appointment is scheduled/ TB gold is due on October 2018,  Have ordered this as well

## 2017-02-23 ENCOUNTER — Ambulatory Visit: Payer: Medicare Other | Admitting: Physical Therapy

## 2017-02-23 DIAGNOSIS — M79621 Pain in right upper arm: Secondary | ICD-10-CM

## 2017-02-23 DIAGNOSIS — R2689 Other abnormalities of gait and mobility: Secondary | ICD-10-CM

## 2017-02-23 DIAGNOSIS — M6281 Muscle weakness (generalized): Secondary | ICD-10-CM

## 2017-02-23 DIAGNOSIS — M25661 Stiffness of right knee, not elsewhere classified: Secondary | ICD-10-CM

## 2017-02-23 DIAGNOSIS — M25562 Pain in left knee: Secondary | ICD-10-CM

## 2017-02-23 DIAGNOSIS — M25561 Pain in right knee: Secondary | ICD-10-CM

## 2017-02-23 NOTE — Therapy (Signed)
[place], [place], [address] Phone: [phone]   Fax:  [phone]  Physical Therapy Treatment  Patient Details  Name: Felicia Gonzalez MRN: [id number] Date of Birth: November 20, 1940 Referring Provider: Dr. Sullivan Lone  Encounter Date: 02/23/2017      PT End of Session - 02/23/17 2158    Visit Number 7   Number of Visits 9   Date for PT Re-Evaluation 02/25/17   PT Start Time 1324   PT Stop Time 1349   PT Time Calculation (min) 46 min   Activity Tolerance Patient tolerated treatment well   Behavior During Therapy [facility] for tasks assessed/performed      Past Medical History:  Diagnosis Date  . Anemia   . Arthritis    rheumatoid +CCP neg RF +ANA synovitis   . Asthma   . Cancer [facility])    Breast cancer  . Glaucoma    right eye  . Osteoporosis     Past Surgical History:  Procedure Laterality Date  . BREAST LUMPECTOMY WITH RADIOACTIVE SEED AND SENTINEL LYMPH NODE BIOPSY Left 07/09/2016   Procedure: BREAST LUMPECTOMY WITH RADIOACTIVE SEED AND SENTINEL LYMPH NODE BIOPSY;  Surgeon: Coralie Keens, MD;  Location: [place];  Service: General;  Laterality: Left;  . NO PAST SURGERIES    . TOTAL HIP ARTHROPLASTY Left 10/01/2014   Procedure: TOTAL HIP ARTHROPLASTY;  Surgeon: Garald Balding, MD;  Location: [place];  Service: Orthopedics;  Laterality: Left;    There were no vitals filed for this visit.      Subjective Assessment - 02/23/17 1305    Subjective My husband has been sick so I haven't done my exercises and I've been taking care of him. But my oncologist said I can get the [place]. Didn't have added soreness after last visit.   Pertinent History h/o left THA 10/01/14 because of a fall and a fracture, and then went to rehab at [facility]. Last December a small cancer was found, which was removed 07/09/16.  Left SLNB was negative.  Had radiation and finished 10/04/16. Before that she was feeling a lot better from  a medication (Orencia), but got a bad flu and then cancer, and has been off that medication.  Is now on other medications for this. Referral says this:  "Patient with early stage breast cancer on adjuvant endocrine therapy with fatigue and balance issues. Please evaluate and treatment for strength/endurance/balance and ROM."  Breast cancer was on left side. HTN controlled with meds. Osteoporosis, and gets injections for this every 6 months.   Currently in Pain? Yes   Pain Score 2    Pain Location Knee   Pain Orientation Left   Aggravating Factors  possible stretching                         [facility] Adult PT Treatment/Exercise - 02/23/17 0001      Self-Care   Other Self-Care Comments  Discussed community program including [employer] and  [employer].  Pt. prefers [employer] and was given information about that.     Knee/Hip Exercises: Aerobic   Nustep Level 5, 5 minutes  at 60 rpm +/-; mild dyspnea     Knee/Hip Exercises: Machines for Strengthening   Total Gym Leg Press 35# x10, then 45# x 5  Batca Machine     Knee/Hip Exercises: Seated   Other Seated Knee/Hip Exercises seated SLR  x 10 each right and left;     Knee/Hip Exercises: Supine   Other Supine Knee/Hip Exercises review from HEP given last time: piriformis stretches for both sides, 20 seconds x 3 (also had her use a towel around left thigh to help do that without pain   Other Supine Knee/Hip Exercises review from HEP given last time:  bridging with hands across abdomen, 3 counts x 10  discomfort Rt. shoulder IF arm straight at side                PT Education - 02/23/17 1338    Education provided Yes   Education Details about [employer] and [employer]) Educated Patient   Methods Explanation;Handout  handout about [employer] [facility] [employer]   Comprehension Verbalized understanding                [place] - 02/23/17 1341       CC Long Term Goal  #3   Title Pt. will be knowledgeable about community programs available to her for exercise such as [employer] and senior centers in town   Baseline done 02/23/17   Status Achieved            Plan - 02/23/17 2200    Clinical Impression Statement Pt. continues to do well.  She reported no increased soreness after increased exercises last visit. She has not been able to do her HEP and canceled an appointment earlier this week becauseher husband was ill.  She may choose to renew at next visit. She is interested in [place] [facility] [employer] and plans to call to check into that.   Rehab Potential Good   PT Frequency 2x / week   PT Duration 4 weeks   PT Treatment/Interventions ADLs/Self Care Home Management;Electrical Stimulation;Moist Heat;Cryotherapy;DME Instruction;Gait training;Stair training;Therapeutic activities;Therapeutic exercise;Balance training;Neuromuscular re-education;Patient/family education;Manual techniques;Passive range of motion   PT Next Visit Plan Check goals, check to see if she learned more about [employer] [facility] [employer], and decide about renewal next visit. Add hamstring isometrics to HEP. Continue heat to knees or other sore joints at end of session prn.   Consulted and Agree with Plan of Care Patient      Patient will benefit from skilled therapeutic intervention in order to improve the following deficits and impairments:  Abnormal gait, Decreased range of motion, Decreased strength, Pain  Visit Diagnosis: Stiffness of right knee, not elsewhere classified  Muscle weakness (generalized)  Right knee pain, unspecified chronicity  Left knee pain, unspecified chronicity  Other abnormalities of gait and mobility  Pain in right upper arm     Problem List Patient Active Problem List   Diagnosis Date Noted  . Malignant neoplasm of lower-inner quadrant of left breast in female, estrogen receptor positive ([place]) 10/04/2016  . Osteoporosis  10/04/2016  . Malignant neoplasm of upper-inner quadrant of left breast in female, estrogen receptor positive ([place]) 08/26/2016  . High risk medication use 06/08/2016  . Age-related osteoporosis without current pathological fracture 06/08/2016  . ANA positive 06/08/2016  . Moderate persistent asthma 06/08/2016  . Normocytic anemia 10/01/2014  . Rheumatoid arthritis of multiple sites without organ or system involvement with positive rheumatoid factor ([place]) 10/01/2014  . Subcapital fracture of left hip ([place]) 10/01/2014    SALISBURY,DONNA 02/23/2017, 10:05 PM  [place], [place], [address] Phone: [phone]   Fax:  [phone]  Name: Jaicee  MAKAYAH PAULI MRN: [id number] Date of Birth: 1940-09-13  Serafina Royals, PT 02/23/17 10:05 PM

## 2017-02-25 ENCOUNTER — Ambulatory Visit: Payer: Medicare Other | Admitting: Physical Therapy

## 2017-02-28 ENCOUNTER — Ambulatory Visit: Payer: Medicare Other | Attending: Hematology | Admitting: Physical Therapy

## 2017-02-28 DIAGNOSIS — M6281 Muscle weakness (generalized): Secondary | ICD-10-CM | POA: Diagnosis present

## 2017-02-28 DIAGNOSIS — M25661 Stiffness of right knee, not elsewhere classified: Secondary | ICD-10-CM | POA: Diagnosis not present

## 2017-02-28 DIAGNOSIS — M25562 Pain in left knee: Secondary | ICD-10-CM | POA: Insufficient documentation

## 2017-02-28 DIAGNOSIS — M25561 Pain in right knee: Secondary | ICD-10-CM | POA: Diagnosis present

## 2017-02-28 NOTE — Therapy (Addendum)
[place], [place], [address] Phone: [phone]   Fax:  [phone]  Physical Therapy Treatment  Patient Details  Name: PALAK TERCERO MRN: [id number] Date of Birth: 1941-04-01 Referring Provider: Dr. Sullivan Lone  Encounter Date: 02/28/2017      PT End of Session - 02/28/17 1428    Visit Number 8   Number of Visits 16   Date for PT Re-Evaluation 03/30/17   PT Start Time 0240   PT Stop Time 1449  3 units is correct   PT Time Calculation (min) 64 min   Activity Tolerance Patient tolerated treatment well   Behavior During Therapy [facility] for tasks assessed/performed      Past Medical History:  Diagnosis Date  . Anemia   . Arthritis    rheumatoid +CCP neg RF +ANA synovitis   . Asthma   . Cancer [facility])    Breast cancer  . Glaucoma    right eye  . Osteoporosis     Past Surgical History:  Procedure Laterality Date  . BREAST LUMPECTOMY WITH RADIOACTIVE SEED AND SENTINEL LYMPH NODE BIOPSY Left 07/09/2016   Procedure: BREAST LUMPECTOMY WITH RADIOACTIVE SEED AND SENTINEL LYMPH NODE BIOPSY;  Surgeon: Coralie Keens, MD;  Location: [place];  Service: General;  Laterality: Left;  . NO PAST SURGERIES    . TOTAL HIP ARTHROPLASTY Left 10/01/2014   Procedure: TOTAL HIP ARTHROPLASTY;  Surgeon: Garald Balding, MD;  Location: [place];  Service: Orthopedics;  Laterality: Left;    There were no vitals filed for this visit.      Subjective Assessment - 02/28/17 1346    Subjective Left leg has been bothering for a few days.  Doesn't have an appointment yet for the medication. Left a message for head of [employer] [facility] [employer] late last week, but hasn't heard back.   Currently in Pain? Yes   Pain Score 6    Pain Location Knee   Pain Orientation Left   Aggravating Factors  NuStep?                         [place] Adult PT Treatment/Exercise - 02/28/17 0001      Neck Exercises: Standing   Neck  Retraction 10 reps  isometrics against pillow against wall     Lumbar Exercises: Standing   Other Standing Lumbar Exercises standing pelvic tilts x 10     Knee/Hip Exercises: Seated   Abduction/Adduction  Strengthening;Both;10 reps  for abduction, gait belt around distal thighs, isometric     Knee/Hip Exercises: Supine   Quad Sets Strengthening;Right;Left;3 sets;10 reps  small inflated football below knee, with glut set simult.   Bridges Limitations Bridging x 10   Other Supine Knee/Hip Exercises ball squeezes x 10     Shoulder Exercises: Standing   Protraction Strengthening;Both;10 reps  back in corner, resisted by therapist with physioball betwee     Shoulder Exercises: ROM/Strengthening   "W" Arms 10 reps with small movement trying to keep elbows against wall     Shoulder Exercises: Isometric Strengthening   Flexion --  standing facing wall, 3 counts x 10, bilat.   Extension --  standing back to wall, 3 counts x 10, bilat.   ABduction --  standing, back in corner, 3 counts x 10     Moist Heat Therapy   Number Minutes Moist Heat 20 Minutes   Moist Heat Location Knee  bilat. in supine  Ankle Exercises: Supine   Isometrics isometric ankle plantarflexion against wedge x 10                        Long Term Clinic Goals - 02/28/17 1349      CC Long Term Goal  #1   Title Pt. will be independent in home exercise program for UE and LE strengthening.   Status Achieved     CC Long Term Goal  #2   Title Pt. will report at least 25% decrease in pain overall.   Status Achieved     CC Long Term Goal  #3   Title Pt. will be knowledgeable about community programs available to her for exercise such as [employer] and senior centers in town   Status Achieved     CC Long Term Goal  #4   Title Pt. will be independent in more advanced home exercise program within the limits of her joint pain.   Time 2   Period Weeks   Status New   Target Date  03/18/17            Plan - 02/28/17 1429    Clinical Impression Statement Pt. does well with isometric exercises. She felt like the NuStep bothered her leg pain with its repetitive knee flexion/extension. Included additional UE exercises today.   Rehab Potential Good   PT Frequency 2x / week   PT Duration 2 weeks  to 4 weeks prn   PT Treatment/Interventions ADLs/Self Care Home Management;Electrical Stimulation;Moist Heat;Cryotherapy;DME Instruction;Gait training;Stair training;Therapeutic activities;Therapeutic exercise;Balance training;Neuromuscular re-education;Patient/family education;Manual techniques;Passive range of motion   PT Next Visit Plan Renewal done today. Continue 2x/wk x 2 wks (to 4 weeks prn) after this week.  Continue exercise with emphasis on isometrics; add hamstring isometrics to HEP.  Continue heat to knees at end of session.   PT Home Exercise Plan continue Meeks' decompression; add quad and glut sets, hip ab- and adductor isometrics, and hamstring stretches   Consulted and Agree with Plan of Care Patient      Patient will benefit from skilled therapeutic intervention in order to improve the following deficits and impairments:  Abnormal gait, Decreased range of motion, Decreased strength, Pain  Visit Diagnosis: Stiffness of right knee, not elsewhere classified - Plan: PT plan of care cert/re-cert  Muscle weakness (generalized) - Plan: PT plan of care cert/re-cert  Right knee pain, unspecified chronicity - Plan: PT plan of care cert/re-cert  Left knee pain, unspecified chronicity - Plan: PT plan of care cert/re-cert     Problem List Patient Active Problem List   Diagnosis Date Noted  . Malignant neoplasm of lower-inner quadrant of left breast in female, estrogen receptor positive ([place]) 10/04/2016  . Osteoporosis 10/04/2016  . Malignant neoplasm of upper-inner quadrant of left breast in female, estrogen receptor positive ([place]) 08/26/2016  . High risk  medication use 06/08/2016  . Age-related osteoporosis without current pathological fracture 06/08/2016  . ANA positive 06/08/2016  . Moderate persistent asthma 06/08/2016  . Normocytic anemia 10/01/2014  . Rheumatoid arthritis of multiple sites without organ or system involvement with positive rheumatoid factor ([place]) 10/01/2014  . Subcapital fracture of left hip ([place]) 10/01/2014    SALISBURY,DONNA 02/28/2017, 2:52 PM  [place], [place], [address] Phone: [phone]   Fax:  [phone]  Name: SHELL BLANCHETTE MRN: [id number] Date of Birth: February 22, 1941  Serafina Royals, PT 02/28/17 2:52 PM  PHYSICAL  THERAPY DISCHARGE SUMMARY  Visits from Start of Care: 8  Current functional level related to goals / functional outcomes: Original goals had been met. A new goal was written for her as we planned to continue therapy and a renewal was sent at the time of this visit for that.  The patient did not, however, return for more visits, so that goal was not set.   Remaining deficits: Unknown, as the patient did not complete her course of therapy.   Education / Equipment: Home exercise program. Plan: Patient agrees to discharge.  Patient goals were partially met. Patient is being discharged due to not returning since the last visit.  ?????    Serafina Royals, PT 09/08/17 2:14 PM

## 2017-03-02 ENCOUNTER — Ambulatory Visit: Payer: Medicare Other

## 2017-03-05 ENCOUNTER — Other Ambulatory Visit: Payer: Self-pay | Admitting: Rheumatology

## 2017-03-07 ENCOUNTER — Telehealth: Payer: Self-pay | Admitting: Rheumatology

## 2017-03-07 NOTE — Telephone Encounter (Signed)
Patient would like to know what medication, if any is she suppose to stop due to starting Orencia this coming Monday. Please call to advise.

## 2017-03-07 NOTE — Telephone Encounter (Signed)
Last Visit: 01/20/17 Next Visit: 06/15/17 Labs: 01/17/17 AST 36 Previously normal, Hgb 9.9 Previously 10.3   Okay to refill per Dr. Estanislado Pandy

## 2017-03-07 NOTE — Telephone Encounter (Signed)
May dc PLQ after the infusion.

## 2017-03-07 NOTE — Telephone Encounter (Signed)
Patient is restarting Felicia Gonzalez and is currently on [place] and [place]. Do you want her to discontinue either of these medications.

## 2017-03-08 NOTE — Telephone Encounter (Signed)
Patient advised to discontinue the PLQ after infusion.

## 2017-03-09 ENCOUNTER — Ambulatory Visit: Payer: Medicare Other

## 2017-03-11 ENCOUNTER — Encounter: Payer: Medicare Other | Admitting: Physical Therapy

## 2017-03-14 ENCOUNTER — Ambulatory Visit (HOSPITAL_COMMUNITY)
Admission: RE | Admit: 2017-03-14 | Discharge: 2017-03-14 | Disposition: A | Discharge status: Home / Self Care | Payer: Medicare Other | Source: Ambulatory Visit | Attending: Rheumatology | Admitting: Rheumatology

## 2017-03-14 DIAGNOSIS — M0579 Rheumatoid arthritis with rheumatoid factor of multiple sites without organ or systems involvement: Secondary | ICD-10-CM | POA: Insufficient documentation

## 2017-03-14 LAB — CBC
HEMATOCRIT: 31 % — AB (ref 36.0–46.0)
HEMOGLOBIN: 9.7 g/dL — AB (ref 12.0–15.0)
MCH: 26.9 pg (ref 26.0–34.0)
MCHC: 31.3 g/dL (ref 30.0–36.0)
MCV: 85.9 fL (ref 78.0–100.0)
Platelets: 338 10*3/uL (ref 150–400)
RBC: 3.61 MIL/uL — AB (ref 3.87–5.11)
RDW: 15.9 % — ABNORMAL HIGH (ref 11.5–15.5)
WBC: 7 10*3/uL (ref 4.0–10.5)

## 2017-03-14 LAB — COMPREHENSIVE METABOLIC PANEL
ALK PHOS: 78 U/L (ref 38–126)
ALT: 27 U/L (ref 14–54)
AST: 29 U/L (ref 15–41)
Albumin: 3.1 g/dL — ABNORMAL LOW (ref 3.5–5.0)
Anion gap: 10 (ref 5–15)
BILIRUBIN TOTAL: 0.5 mg/dL (ref 0.3–1.2)
BUN: 8 mg/dL (ref 6–20)
CALCIUM: 9.1 mg/dL (ref 8.9–10.3)
CO2: 26 mmol/L (ref 22–32)
CREATININE: 0.7 mg/dL (ref 0.44–1.00)
Chloride: 100 mmol/L — ABNORMAL LOW (ref 101–111)
Glucose, Bld: 91 mg/dL (ref 65–99)
Potassium: 3.7 mmol/L (ref 3.5–5.1)
Sodium: 136 mmol/L (ref 135–145)
Total Protein: 6.4 g/dL — ABNORMAL LOW (ref 6.5–8.1)

## 2017-03-14 MED ORDER — ACETAMINOPHEN 325 MG PO TABS
ORAL_TABLET | ORAL | Status: AC
  Filled 2017-03-14: qty 2

## 2017-03-14 MED ORDER — DIPHENHYDRAMINE HCL 25 MG PO CAPS
25.0000 mg | ORAL_CAPSULE | ORAL | Status: DC
  Administered 2017-03-14: 25 mg via ORAL

## 2017-03-14 MED ORDER — DIPHENHYDRAMINE HCL 25 MG PO CAPS
ORAL_CAPSULE | ORAL | Status: AC
  Filled 2017-03-14: qty 1

## 2017-03-14 MED ORDER — ACETAMINOPHEN 325 MG PO TABS
650.0000 mg | ORAL_TABLET | ORAL | Status: DC
  Administered 2017-03-14: 650 mg via ORAL

## 2017-03-14 MED ORDER — SODIUM CHLORIDE 0.9 % IV SOLN
750.0000 mg | INTRAVENOUS | Status: DC
  Administered 2017-03-14: 750 mg via INTRAVENOUS
  Filled 2017-03-14: qty 30

## 2017-03-14 NOTE — Progress Notes (Signed)
Labs are stable. Patient is still anemic. These for labs to her oncologist and PCP.

## 2017-03-16 ENCOUNTER — Encounter: Payer: Medicare Other | Admitting: Physical Therapy

## 2017-03-18 ENCOUNTER — Encounter: Payer: Medicare Other | Admitting: Physical Therapy

## 2017-03-21 LAB — QUANTIFERON-TB GOLD PLUS: QUANTIFERON-TB GOLD PLUS: NEGATIVE

## 2017-03-21 LAB — QUANTIFERON-TB GOLD PLUS (RQFGPL)
QuantiFERON Mitogen Value: >10 IU/mL
QuantiFERON Nil Value: 0.65 IU/mL
QuantiFERON TB1 Ag Value: 0.71 IU/mL
QuantiFERON TB2 Ag Value: 0.72 IU/mL

## 2017-04-08 ENCOUNTER — Other Ambulatory Visit: Payer: Self-pay | Admitting: *Deleted

## 2017-04-08 NOTE — Progress Notes (Signed)
Infusion orders are current for patient CBC CMP Tylenol Benadryl appointments are up to date and follow up appointment  is scheduled TB gold not due yet.  

## 2017-04-11 ENCOUNTER — Encounter (HOSPITAL_COMMUNITY): Payer: Medicare Other

## 2017-04-12 ENCOUNTER — Other Ambulatory Visit (HOSPITAL_COMMUNITY): Payer: Self-pay | Admitting: *Deleted

## 2017-04-13 ENCOUNTER — Ambulatory Visit (HOSPITAL_COMMUNITY)
Admission: RE | Admit: 2017-04-13 | Discharge: 2017-04-13 | Disposition: A | Discharge status: Home / Self Care | Payer: Medicare Other | Source: Ambulatory Visit | Attending: Rheumatology | Admitting: Rheumatology

## 2017-04-13 DIAGNOSIS — M0579 Rheumatoid arthritis with rheumatoid factor of multiple sites without organ or systems involvement: Secondary | ICD-10-CM | POA: Diagnosis not present

## 2017-04-13 LAB — CBC
HEMATOCRIT: 30.8 % — AB (ref 36.0–46.0)
Hemoglobin: 9.8 g/dL — ABNORMAL LOW (ref 12.0–15.0)
MCH: 27.6 pg (ref 26.0–34.0)
MCHC: 31.8 g/dL (ref 30.0–36.0)
MCV: 86.8 fL (ref 78.0–100.0)
PLATELETS: 278 10*3/uL (ref 150–400)
RBC: 3.55 MIL/uL — ABNORMAL LOW (ref 3.87–5.11)
RDW: 16 % — AB (ref 11.5–15.5)
WBC: 7 10*3/uL (ref 4.0–10.5)

## 2017-04-13 LAB — COMPREHENSIVE METABOLIC PANEL
ALT: 31 U/L (ref 14–54)
AST: 30 U/L (ref 15–41)
Albumin: 3.1 g/dL — ABNORMAL LOW (ref 3.5–5.0)
Alkaline Phosphatase: 68 U/L (ref 38–126)
Anion gap: 8 (ref 5–15)
BILIRUBIN TOTAL: 0.5 mg/dL (ref 0.3–1.2)
BUN: 9 mg/dL (ref 6–20)
CHLORIDE: 101 mmol/L (ref 101–111)
CO2: 27 mmol/L (ref 22–32)
CREATININE: 0.67 mg/dL (ref 0.44–1.00)
Calcium: 9.1 mg/dL (ref 8.9–10.3)
Glucose, Bld: 92 mg/dL (ref 65–99)
POTASSIUM: 3.6 mmol/L (ref 3.5–5.1)
Sodium: 136 mmol/L (ref 135–145)
TOTAL PROTEIN: 6.2 g/dL — AB (ref 6.5–8.1)

## 2017-04-13 MED ORDER — SODIUM CHLORIDE 0.9 % IV SOLN
750.0000 mg | INTRAVENOUS | Status: DC
  Administered 2017-04-13: 750 mg via INTRAVENOUS
  Filled 2017-04-13: qty 30

## 2017-04-13 MED ORDER — DIPHENHYDRAMINE HCL 25 MG PO CAPS
ORAL_CAPSULE | ORAL | Status: AC
Start: 2017-04-13 — End: 2017-04-13
  Administered 2017-04-13: 25 mg via ORAL
  Filled 2017-04-13: qty 1

## 2017-04-13 MED ORDER — ACETAMINOPHEN 325 MG PO TABS
ORAL_TABLET | ORAL | Status: AC
  Administered 2017-04-13: 650 mg via ORAL
  Filled 2017-04-13: qty 2

## 2017-04-13 MED ORDER — ACETAMINOPHEN 325 MG PO TABS
650.0000 mg | ORAL_TABLET | ORAL | Status: DC
  Administered 2017-04-13: 650 mg via ORAL

## 2017-04-13 MED ORDER — DIPHENHYDRAMINE HCL 25 MG PO CAPS
25.0000 mg | ORAL_CAPSULE | ORAL | Status: DC
  Administered 2017-04-13: 25 mg via ORAL

## 2017-04-13 NOTE — Progress Notes (Signed)
Labs are stable. Hemoglobin is low but stable. Please fax results to her hematologist. Please notify patient of her lab results.

## 2017-04-15 ENCOUNTER — Other Ambulatory Visit: Payer: Self-pay | Admitting: Hematology

## 2017-04-18 ENCOUNTER — Ambulatory Visit (HOSPITAL_BASED_OUTPATIENT_CLINIC_OR_DEPARTMENT_OTHER): Payer: Medicare Other

## 2017-04-18 ENCOUNTER — Ambulatory Visit (HOSPITAL_BASED_OUTPATIENT_CLINIC_OR_DEPARTMENT_OTHER): Payer: Medicare Other | Admitting: Hematology

## 2017-04-18 ENCOUNTER — Telehealth: Payer: Self-pay | Admitting: Hematology

## 2017-04-18 ENCOUNTER — Other Ambulatory Visit (HOSPITAL_BASED_OUTPATIENT_CLINIC_OR_DEPARTMENT_OTHER): Payer: Medicare Other

## 2017-04-18 ENCOUNTER — Ambulatory Visit: Payer: Medicare Other

## 2017-04-18 VITALS — BP 154/70 | HR 86 | Temp 97.9°F | Resp 18 | Ht 60.0 in | Wt 141.9 lb

## 2017-04-18 DIAGNOSIS — D649 Anemia, unspecified: Secondary | ICD-10-CM | POA: Diagnosis not present

## 2017-04-18 DIAGNOSIS — D509 Iron deficiency anemia, unspecified: Secondary | ICD-10-CM

## 2017-04-18 DIAGNOSIS — C50312 Malignant neoplasm of lower-inner quadrant of left female breast: Secondary | ICD-10-CM

## 2017-04-18 DIAGNOSIS — M810 Age-related osteoporosis without current pathological fracture: Secondary | ICD-10-CM | POA: Diagnosis not present

## 2017-04-18 DIAGNOSIS — Z170 Estrogen receptor positive status [ER+]: Secondary | ICD-10-CM

## 2017-04-18 DIAGNOSIS — D638 Anemia in other chronic diseases classified elsewhere: Secondary | ICD-10-CM

## 2017-04-18 DIAGNOSIS — M069 Rheumatoid arthritis, unspecified: Secondary | ICD-10-CM | POA: Diagnosis not present

## 2017-04-18 LAB — COMPREHENSIVE METABOLIC PANEL
ALBUMIN: 3.2 g/dL — AB (ref 3.5–5.0)
ALK PHOS: 77 U/L (ref 40–150)
ALT: 30 U/L (ref 0–55)
ANION GAP: 7 meq/L (ref 3–11)
AST: 26 U/L (ref 5–34)
BILIRUBIN TOTAL: 0.27 mg/dL (ref 0.20–1.20)
BUN: 11.1 mg/dL (ref 7.0–26.0)
CO2: 29 meq/L (ref 22–29)
CREATININE: 0.7 mg/dL (ref 0.6–1.1)
Calcium: 9.2 mg/dL (ref 8.4–10.4)
Chloride: 101 mEq/L (ref 98–109)
Glucose: 103 mg/dl (ref 70–140)
Potassium: 3.7 mEq/L (ref 3.5–5.1)
Sodium: 137 mEq/L (ref 136–145)
TOTAL PROTEIN: 6.9 g/dL (ref 6.4–8.3)

## 2017-04-18 LAB — CBC & DIFF AND RETIC
BASO%: 0.4 % (ref 0.0–2.0)
BASOS ABS: 0 10*3/uL (ref 0.0–0.1)
EOS%: 2.9 % (ref 0.0–7.0)
Eosinophils Absolute: 0.2 10*3/uL (ref 0.0–0.5)
HEMATOCRIT: 31.5 % — AB (ref 34.8–46.6)
HGB: 9.9 g/dL — ABNORMAL LOW (ref 11.6–15.9)
Immature Retic Fract: 2.8 % (ref 1.60–10.00)
LYMPH#: 1.7 10*3/uL (ref 0.9–3.3)
LYMPH%: 24.4 % (ref 14.0–49.7)
MCH: 27.7 pg (ref 25.1–34.0)
MCHC: 31.4 g/dL — AB (ref 31.5–36.0)
MCV: 88 fL (ref 79.5–101.0)
MONO#: 0.7 10*3/uL (ref 0.1–0.9)
MONO%: 10.4 % (ref 0.0–14.0)
NEUT#: 4.4 10*3/uL (ref 1.5–6.5)
NEUT%: 61.9 % (ref 38.4–76.8)
PLATELETS: 275 10*3/uL (ref 145–400)
RBC: 3.58 10*6/uL — AB (ref 3.70–5.45)
RDW: 16.1 % — AB (ref 11.2–14.5)
RETIC %: 1.46 % (ref 0.70–2.10)
RETIC CT ABS: 52.27 10*3/uL (ref 33.70–90.70)
WBC: 7.1 10*3/uL (ref 3.9–10.3)

## 2017-04-18 LAB — FERRITIN: Ferritin: 95 ng/ml (ref 9–269)

## 2017-04-18 LAB — IRON AND TIBC
%SAT: 26 % (ref 21–57)
IRON: 81 ug/dL (ref 41–142)
TIBC: 313 ug/dL (ref 236–444)
UIBC: 231 ug/dL (ref 120–384)

## 2017-04-18 MED ORDER — DENOSUMAB 60 MG/ML ~~LOC~~ SOLN
60.0000 mg | Freq: Once | SUBCUTANEOUS | Status: AC
  Administered 2017-04-18: 60 mg via SUBCUTANEOUS
  Filled 2017-04-18: qty 1

## 2017-04-18 NOTE — Telephone Encounter (Signed)
Gave avs and calendar for December - May 2019

## 2017-04-18 NOTE — Patient Instructions (Signed)

## 2017-04-18 NOTE — Progress Notes (Signed)
Felicia Gonzalez  HEMATOLOGY ONCOLOGY CLINIC NOTE  Date of service: 04/18/17   Patient Care Team: Vernie Shanks, MD as PCP - General (Family Medicine)  Chief Complaint: f/u for management of hormone positive Her 2 neg breast cancer  Diagnosis  Stage I (pT1b, pN0, Mx) left sided invasive ductal carcinoma of the breast. 1 cm in size with lymphovascular invasion grade 2/3, Ki-67 15% ER/PR positive HER-2/neu negative. One Sentinel lymph node biopsy negative No DCIS. Severe calcified benign lesions in left breast with PASH (pseudoangiomatous stromal hyperplasia).  Patient is status post left breast lumpectomy by Dr. Ninfa Linden on 07/09/2016.   S/p adjuvant  Left breast RT on 10/05/2016   INTERVAL HISTORY:  Ms Felicia Gonzalez is here for her scheduled follow-up for stage I left breast cancer. She reports that she is doing well overall. She is now taking Orencia for her RA and states this is making her feel much better. She reports she is going to [place] for the holidays to visit her brother.  He is tolerating Evista well with minimal grade 1 hot flashes but no other significant issues.  Does not have any new breast symptoms.  No new palpable lumps or bumps that she reports. No new bone pains.  Tolerating the Prolia well. We discussed about pros and cons of IV iron given her anemia of chronic disease related to rheumatoid arthritis and she would like to proceed with this.  On review of systems, pt reports constipation, pain to the left knee, mild occasional hot flashes and denies abdominal pain, diarrhea, back pain and any other accompanying symptoms.    REVIEW OF SYSTEMS:    10 Point review of systems of done and is negative except as noted above.  . Past Medical History:  Diagnosis Date  . Anemia   . Arthritis    rheumatoid +CCP neg RF +ANA synovitis   . Asthma   . Cancer [facility])    Breast cancer  . Glaucoma    right eye  . Osteoporosis      . Past Surgical History:  Procedure Laterality Date    . BREAST LUMPECTOMY WITH RADIOACTIVE SEED AND SENTINEL LYMPH NODE BIOPSY Left 07/09/2016   Performed by Coralie Keens, MD at [place]  . NO PAST SURGERIES    . TOTAL HIP ARTHROPLASTY Left 10/01/2014   Performed by Garald Balding, MD at [place]    . Social History   Tobacco Use  . Smoking status: Never Smoker  . Smokeless tobacco: Never Used  Substance Use Topics  . Alcohol use: No  . Drug use: No    ALLERGIES:  is allergic to penicillins and sulfa antibiotics.  MEDICATIONS:  Current Outpatient Medications  Medication Sig Dispense Refill  . acetaminophen (TYLENOL) 650 MG CR tablet Take 1,300 mg by mouth 2 (two) times daily as needed for pain. Take one hour prior to infusion     . albuterol (PROVENTIL HFA;VENTOLIN HFA) 108 (90 Base) MCG/ACT inhaler Inhale 1-2 puffs into the lungs every 6 (six) hours as needed for wheezing or shortness of breath.    Felicia Gonzalez amLODipine (NORVASC) 5 MG tablet     . aspirin EC 81 MG tablet Take 81 mg by mouth daily.    . Cholecalciferol (VITAMIN D3) 5000 units TABS Take 5,000 Units by mouth daily after breakfast.    . hydroxychloroquine (PLAQUENIL) 200 MG tablet TAKE 1 TABLET BY MOUTH TWICE A DAY MONDAY THROUGHT FRIDAY 135 tablet 1  . leflunomide (ARAVA) 20 MG tablet TAKE  1 TABLET BY MOUTH EVERY DAY 90 tablet 0  . Multiple Vitamin (MULTIVITAMIN WITH MINERALS) TABS tablet Take 1 tablet by mouth daily.    . Omega-3 Fatty Acids (OMEGA-3 FISH OIL) 1200 MG CAPS Take 1,200 mg by mouth 2 (two) times daily.    Vladimir Faster Glycol-Propyl Glycol (SYSTANE OP) Place 1 drop into the left eye at bedtime.    . Polysaccharide Iron Complex 15 MG/0.5ML LIQD Take 150 mg by mouth daily. 150 mL 1  . predniSONE (DELTASONE) 5 MG tablet Take 1 tablet (5 mg total) by mouth daily with breakfast. 90 tablet 1  . raloxifene (EVISTA) 60 MG tablet TAKE 1 TABLET BY MOUTH EVERY DAY 30 tablet 1  . saccharomyces boulardii (FLORASTOR) 250 MG capsule Take 250 mg by mouth 2 (two) times daily.     . TRAVATAN Z 0.004 % SOLN ophthalmic solution Place 1 drop into the right eye at bedtime.    . TURMERIC PO Take 1 capsule by mouth daily after breakfast.     No current facility-administered medications for this visit.     PHYSICAL EXAMINATION: ECOG PERFORMANCE STATUS: 1 - Symptomatic but completely ambulatory  . Vitals:   04/18/17 1428  BP: (!) 154/70  Pulse: 86  Resp: 18  Temp: 97.9 F (36.6 C)  SpO2: 100%    Filed Weights   04/18/17 1428  Weight: 141 lb 14.4 oz (64.4 kg)   .Body mass index is 27.71 kg/m.  GENERAL:alert, in no acute distress and comfortable SKIN: skin color, texture, turgor are normal, no rashes or significant lesions EYES: normal, conjunctiva are pink and non-injected, sclera clear OROPHARYNX:no exudate, no erythema and lips, buccal mucosa, and tongue normal  NECK: supple, no JVD, thyroid normal size, non-tender, without nodularity LYMPH:  no palpable lymphadenopathy in the cervical, axillary or inguinal LUNGS: clear to auscultation with normal respiratory effort BREAST- left breast healed lumpectomy scar. No other palpable lumps or regional LNpathy HEART: regular rate & rhythm,  no murmurs and no lower extremity edema ABDOMEN: abdomen soft, non-tender, normoactive bowel sounds  PSYCH: alert & oriented x 3 with fluent speech NEURO: no focal motor/sensory deficits  LABORATORY DATA:   I have reviewed the data as listed  . CBC Latest Ref Rng & Units 04/18/2017 04/13/2017 03/14/2017  WBC 3.9 - 10.3 10e3/uL 7.1 7.0 7.0  Hemoglobin 11.6 - 15.9 g/dL 9.9(L) 9.8(L) 9.7(L)  Hematocrit 34.8 - 46.6 % 31.5(L) 30.8(L) 31.0(L)  Platelets 145 - 400 10e3/uL 275 278 338    . CMP Latest Ref Rng & Units 04/18/2017 04/13/2017 03/14/2017  Glucose 70 - 140 mg/dl 103 92 91  BUN 7.0 - 26.0 mg/dL 11._0 Creatinine 0.6 - 1.1 mg/dL 0.7 0.67 0.70  Sodium 136 - 145 mEq/L 137 136 136  Potassium 3.5 - 5.1 mEq/L 3.7 3.6 3.7  Chloride 101 - 111 mmol/L - 101 100(L)   CO2 22 - 29 mEq/L _1 Calcium 8.4 - 10.4 mg/dL 9.2 9.1 9.1  Total Protein 6.4 - 8.3 g/dL 6.9 6.2(L) 6.4(L)  Total Bilirubin 0.20 - 1.20 mg/dL 0.27 0.5 0.5  Alkaline Phos 40 - 150 U/L 77 68 78  AST 5 - 34 U/L _2 ALT 0 - 55 U/L _3 OH vit D - 54.7        Microscopic Comment 1. BREAST, INVASIVE TUMOR Procedure: Seed localized lumpectomy. Laterality: Left breast. Tumor Size (gross measurement): 1.0 cm Histologic Type: Ductal Grade:  II Tubular Differentiation: 3 Nuclear Pleomorphism: 2 Mitotic Count: 1 Ductal Carcinoma in Situ (DCIS): Not identified Extent of Tumor: Confined to breast parenchyma Margins: Invasive carcinoma, distance from closest margin: Focally 0.1 cm to the anterior margin Regional Lymph Nodes: Number of Lymph Nodes Examined: 1 Number of Sentinel Lymph Nodes Examined: 1 Lymph Nodes with Macrometastases: 0 Lymph Nodes with Micrometastases: 0 Lymph Nodes with Isolated Tumor Cells: 0 Breast Prognostic Profile: [id number] Estrogen Receptor: 100%, strong Progesterone Receptor: 5%, strong Her2: No amplification was detected. The ratio was 1.27 1 of 3 FINAL for DENYM, CHRISTENBERRY B [phone]) Microscopic Comment(continued) Ki-67: 15% Pathologic Stage Classification (pTNM, AJCC 8th Edition): Primary Tumor (pT): pT1b Regional Lymph Nodes (pN): pN0 (JBK:ecj 07/12/2016) Enid Cutter MD Pathologist, Electronic Signature (Case signed 07/12/2016)     CLINICAL DATA:  Followup for probably benign right breast mass and probably benign left breast calcifications.  EXAM: 2D DIGITAL DIAGNOSTIC BILATERAL MAMMOGRAM WITH CAD AND ADJUNCT TOMO  BILATERAL BREAST ULTRASOUND  COMPARISON:  Previous exam(s).  ACR Breast Density Category c: The breast tissue is heterogeneously dense, which may obscure small masses.  FINDINGS: No suspicious masses or calcifications are seen in the right breast. The oval circumscribed mass in the  upper-outer posterior right breast appears unchanged. Spot compression magnification views were performed over the inner left breast demonstrating unchanged 3 mm group of punctate calcifications. There is a mass with irregular margins in the lower inner left breast measuring approximately 7-8 mm.  Mammographic images were processed with CAD.  Physical examination of the left breast reveals a firm nodule at the 8 o'clock position.  Targeted ultrasound of the left breast was performed demonstrating an irregular hypoechoic mass at 8 o'clock 2 cm from nipple measuring 0.8 x 0.6 x 0.7 cm. This corresponds well with the mass seen at mammography. No lymphadenopathy seen in the left axilla.  Targeted ultrasound of the right breast was performed demonstrating a mixed echogenicity mass at 10 o'clock 8 cm from nipple measuring 1.4 x 0.6 x 1.1 cm. Although this may represent an area of fat necrosis is indeterminate. No lymphadenopathy seen in the right axilla.  IMPRESSION: 1. Suspicious mass in the left breast at the 8 o'clock position.  2. Stable appearance of punctate calcifications in the lower inner left breast.  3. Indeterminate mass in the right breast at 10 o'clock 8 cm from the nipple which cannot be clearly characterized as an area of fat necrosis.  RECOMMENDATION: 1. Recommend ultrasound-guided biopsy of the mass in the left breast at the 8 o'clock position.  2. Recommend stereotactic guided biopsy of the calcifications in the lower inner left breast (given the suspicious mass seen in close proximity to the calcifications).  3. Recommend ultrasound-guided biopsy of the mass in the right breast at 10 o'clock position.  Biopsies are being scheduled for the patient.  I have discussed the findings and recommendations with the patient. Results were also provided in writing at the conclusion of the visit. If applicable, a reminder letter will be sent to the  patient regarding the next appointment.  BI-RADS CATEGORY  4: Suspicious.   Electronically Signed   By: Everlean Alstrom M.D.   On: 05/12/2016 13:56   RADIOGRAPHIC STUDIES: I have personally reviewed the radiological images as listed and agreed with the findings in the report. No results found.  ASSESSMENT & PLAN:   76 year old female with  #1 Stage I (pT1b, pN0, Mx) left sided invasive ductal carcinoma of the breast. 1 cm in size  with lymphovascular invasion grade 2/3, Ki-67 15% ER/PR positive HER-2/neu negative. One Sentinel lymph node biopsy negative No DCIS. Severe calcified benign lesions in left breast with PASH (pseudoangiomatous stromal hyperplasia).  Patient is status post left breast lumpectomy by Dr. Ninfa Linden on 07/09/2016. She has healed well from surgery  She completed her adjuvant radiation therapy on 09/2016  Has been on Raloxifene since then for adjuvant endocrine therapy. (AI not considered due to severe osteoporosis with fractures)  #2 Severe osteoporosis - multiple risk factors including chronic steroid therapy, age, rheumatoid arthritis. Her bone density study shows significant osteoporosis despite being on Fosamax for 2 years. Last vitamin D levels are adequate at 54.7 She has had an osteoporotic left hip fracture around May 2016.  Plan -no prohibitive toxicities from Evista and labs stable-- continue Raloxifene -continue ASA 1m po daly -continue Prolia q697monthfor osteoporosis -She is currently on vitamin D 5000 units daily. Would continue this and ensure intake of at least 1000 mg of calcium daily. Would recommend keeping her vitamin D level between 60 and 90. -She was recommended to stay physically active and pursue weightbearing exercises . Was evaluated and mx at the cancer rehab center   #2 Rheumatoid arthritis currently back on Orencia. Plan -Continue follow-up with Dr. DeEstanislado Pandyrom rheumatology for ongoing treatments.  #3 Anemia of  chronic disease due to chronic inflammation from RA  - since ferritin <100 will setup patient for 1 dose of IV iron injectafer in December 2018 -Mammogram mid December 2018  MMG in mid December 2018 IV Injectafer x 1 dose in mid December Continue Prolia q6 months RTC with Dr KaIrene Limbon 3 months with labs   I spent 20 minutes counseling the patient face to face. The total time spent in the appointment was 25 minutes and more than 50% was on counseling and direct patient cares.    GaSullivan LoneD MSNaplesAHIVMS SCRochester Ambulatory Surgery CenterTOrange City Surgery Centerematology/Oncology Physician CoMill [place](Office):       [phone] cell):  [phone]):           [phone] document serves as a record of services personally performed by GaSullivan LoneMD. It was created on his behalf by LaAlean Rinnea trained medical scribe. The creation of this record is based on the scribe's personal observations and the provider's statements to them.   .I have reviewed the above documentation for accuracy and completeness, and I agree with the above. .GBrunetta GeneraD MS

## 2017-04-19 LAB — VITAMIN D 25 HYDROXY (VIT D DEFICIENCY, FRACTURES): VIT D 25 HYDROXY: 54.7 ng/mL (ref 30.0–100.0)

## 2017-05-06 ENCOUNTER — Telehealth: Payer: Self-pay

## 2017-05-06 NOTE — Telephone Encounter (Signed)
Called the patient to verify benefits for 2019. Pt currently receives infusions that may require a pre-certification. Patient states that her insurance will remain the same for 2019 Felicia Gonzalez, [place]).  Felicia Gonzalez, [place], CPhT 9:33 AM

## 2017-05-11 ENCOUNTER — Encounter (HOSPITAL_COMMUNITY): Payer: Medicare Other

## 2017-05-16 ENCOUNTER — Ambulatory Visit (HOSPITAL_COMMUNITY)
Admission: RE | Admit: 2017-05-16 | Discharge: 2017-05-16 | Disposition: A | Discharge status: Home / Self Care | Payer: Medicare Other | Source: Ambulatory Visit | Attending: Rheumatology | Admitting: Rheumatology

## 2017-05-16 DIAGNOSIS — M0579 Rheumatoid arthritis with rheumatoid factor of multiple sites without organ or systems involvement: Secondary | ICD-10-CM | POA: Diagnosis not present

## 2017-05-16 MED ORDER — DIPHENHYDRAMINE HCL 25 MG PO CAPS: 25.0000 mg | ORAL_CAPSULE | ORAL | Status: DC

## 2017-05-16 MED ORDER — ACETAMINOPHEN 325 MG PO TABS: 650.0000 mg | ORAL_TABLET | ORAL | Status: DC

## 2017-05-16 MED ORDER — DIPHENHYDRAMINE HCL 25 MG PO CAPS
ORAL_CAPSULE | ORAL | Status: AC
Start: 2017-05-16 — End: 2017-05-16
  Administered 2017-05-16: 25 mg
  Filled 2017-05-16: qty 1

## 2017-05-16 MED ORDER — SODIUM CHLORIDE 0.9 % IV SOLN
750.0000 mg | INTRAVENOUS | Status: AC
  Administered 2017-05-16: 750 mg via INTRAVENOUS
  Filled 2017-05-16: qty 30

## 2017-05-18 ENCOUNTER — Ambulatory Visit (HOSPITAL_BASED_OUTPATIENT_CLINIC_OR_DEPARTMENT_OTHER): Payer: Medicare Other

## 2017-05-18 VITALS — BP 127/69 | HR 81 | Temp 97.1°F | Resp 16

## 2017-05-18 DIAGNOSIS — C50312 Malignant neoplasm of lower-inner quadrant of left female breast: Secondary | ICD-10-CM

## 2017-05-18 DIAGNOSIS — Z170 Estrogen receptor positive status [ER+]: Secondary | ICD-10-CM

## 2017-05-18 DIAGNOSIS — M810 Age-related osteoporosis without current pathological fracture: Secondary | ICD-10-CM

## 2017-05-18 DIAGNOSIS — D638 Anemia in other chronic diseases classified elsewhere: Secondary | ICD-10-CM

## 2017-05-18 DIAGNOSIS — M069 Rheumatoid arthritis, unspecified: Secondary | ICD-10-CM

## 2017-05-18 MED ORDER — SODIUM CHLORIDE 0.9 % IV SOLN
Freq: Once | INTRAVENOUS | Status: AC
  Administered 2017-05-18: 15:00:00 via INTRAVENOUS

## 2017-05-18 MED ORDER — SODIUM CHLORIDE 0.9 % IV SOLN
750.0000 mg | Freq: Once | INTRAVENOUS | Status: AC
  Administered 2017-05-18: 750 mg via INTRAVENOUS
  Filled 2017-05-18: qty 15

## 2017-05-18 NOTE — Progress Notes (Signed)
Upon initial assessment patient stated she has not been feeling well the past couple of days and has been having hot flashes. Called to make Dr. Irene Limbo aware. No new orders received. Patient aware that Dr. Irene Limbo was made aware of what she reported.

## 2017-05-18 NOTE — Patient Instructions (Signed)
Ferric carboxymaltose injection What is this medicine? FERRIC CARBOXYMALTOSE (ferr-ik car-box-ee-mol-toes) is an iron complex. Iron is used to make healthy red blood cells, which carry oxygen and nutrients throughout the body. This medicine is used to treat anemia in people with chronic kidney disease or people who cannot take iron by mouth. This medicine may be used for other purposes; ask your health care provider or pharmacist if you have questions. COMMON BRAND NAME(S): Injectafer What should I tell my health care provider before I take this medicine? They need to know if you have any of these conditions: -anemia not caused by low iron levels -high levels of iron in the blood -liver disease -an unusual or allergic reaction to iron, other medicines, foods, dyes, or preservatives -pregnant or trying to get pregnant -breast-feeding How should I use this medicine? This medicine is for infusion into a vein. It is given by a health care professional in a hospital or clinic setting. Talk to your pediatrician regarding the use of this medicine in children. Special care may be needed. Overdosage: If you think you have taken too much of this medicine contact a poison control center or emergency room at once. NOTE: This medicine is only for you. Do not share this medicine with others. What if I miss a dose? It is important not to miss your dose. Call your doctor or health care professional if you are unable to keep an appointment. What may interact with this medicine? Do not take this medicine with any of the following medications: -deferoxamine -dimercaprol -other iron products This medicine may also interact with the following medications: -chloramphenicol -deferasirox This list may not describe all possible interactions. Give your health care provider a list of all the medicines, herbs, non-prescription drugs, or dietary supplements you use. Also tell them if you smoke, drink alcohol, or use  illegal drugs. Some items may interact with your medicine. What should I watch for while using this medicine? Visit your doctor or health care professional regularly. Tell your doctor if your symptoms do not start to get better or if they get worse. You may need blood work done while you are taking this medicine. You may need to follow a special diet. Talk to your doctor. Foods that contain iron include: whole grains/cereals, dried fruits, beans, or peas, leafy green vegetables, and organ meats (liver, kidney). What side effects may I notice from receiving this medicine? Side effects that you should report to your doctor or health care professional as soon as possible: -allergic reactions like skin rash, itching or hives, swelling of the face, lips, or tongue -breathing problems -changes in blood pressure -feeling faint or lightheaded, falls -flushing, sweating, or hot feelings Side effects that usually do not require medical attention (report to your doctor or health care professional if they continue or are bothersome): -changes in taste -constipation -dizziness -headache -nausea -pain, redness, or irritation at site where injected -vomiting This list may not describe all possible side effects. Call your doctor for medical advice about side effects. You may report side effects to FDA at 1-800-FDA-1088. Where should I keep my medicine? This drug is given in a hospital or clinic and will not be stored at home. NOTE: This sheet is a summary. It may not cover all possible information. If you have questions about this medicine, talk to your doctor, pharmacist, or health care provider.  2018 Elsevier/Gold Standard (2015-06-19 11:20:47)  

## 2017-05-25 ENCOUNTER — Ambulatory Visit
Admission: RE | Admit: 2017-05-25 | Discharge: 2017-05-25 | Disposition: A | Discharge status: Home / Self Care | Payer: Medicare Other | Source: Ambulatory Visit | Attending: Hematology | Admitting: Hematology

## 2017-05-25 DIAGNOSIS — C50312 Malignant neoplasm of lower-inner quadrant of left female breast: Secondary | ICD-10-CM

## 2017-05-25 DIAGNOSIS — Z170 Estrogen receptor positive status [ER+]: Secondary | ICD-10-CM

## 2017-05-25 HISTORY — DX: Personal history of irradiation: Z92.3

## 2017-05-29 ENCOUNTER — Other Ambulatory Visit: Payer: Self-pay | Admitting: Rheumatology

## 2017-05-30 ENCOUNTER — Telehealth: Payer: Self-pay | Admitting: Rheumatology

## 2017-05-30 NOTE — Telephone Encounter (Signed)
Prescription has been sent to the pharmacy. Patient advised.

## 2017-05-30 NOTE — Telephone Encounter (Signed)
Patient called to request prescription refill for Felicia Gonzalez.  Pharmacy is [facility].  Her CB# [phone]

## 2017-05-30 NOTE — Telephone Encounter (Signed)
Last Visit: 01/20/17 Next Visit: 06/15/17 Labs: 04/13/17 Labs are stable. Hemoglobin is low but stable  Okay to refill per Dr. Estanislado Pandy

## 2017-06-01 NOTE — Progress Notes (Signed)
Office Visit Note  Patient: Felicia Gonzalez             Date of Birth: 09/15/1940           MRN: [id number]             PCP: Vernie Shanks, MD Referring: Vernie Shanks, MD Visit Date: 06/15/2017 Occupation: _0 @    Subjective:  Other (doing good )   History of Present Illness: Felicia Gonzalez is a 77 y.o. female history of sero positive rheumatoid arthritis. Her a course off Orencia was interrupted and then Orencia was restarted about 3 months ago. When she  restart  Orencia she discontinued Plaquenil. She's been on combination therapy with Arava 20 mg and Orencia IV monthly. She's a still taking prednisone 5 mg by mouth daily. She has noticed improvement in her joint symptoms. She denies any joint swelling or joint pain at this time. She had iron infusion last month for anemia by oncology.  Activities of Daily Living:  Patient reports morning stiffness for 0 minute.   Patient Denies nocturnal pain.  Difficulty dressing/grooming: Denies Difficulty climbing stairs: Denies Difficulty getting out of chair: Denies Difficulty using hands for taps, buttons, cutlery, and/or writing: Denies   Review of Systems  Constitutional: Negative for fatigue, night sweats, weight gain, weight loss and weakness.  HENT: Negative for mouth sores, trouble swallowing, trouble swallowing, mouth dryness and nose dryness.   Eyes: Negative for pain, redness, visual disturbance and dryness.  Respiratory: Negative for cough, shortness of breath and difficulty breathing.   Cardiovascular: Negative for chest pain, palpitations, hypertension, irregular heartbeat and swelling in legs/feet.  Gastrointestinal: Negative for blood in stool, constipation and diarrhea.  Endocrine: Negative for increased urination.  Genitourinary: Negative for vaginal dryness.  Musculoskeletal: Negative for arthralgias, joint pain, joint swelling, myalgias, muscle weakness, morning stiffness, muscle tenderness and myalgias.  Skin:  Negative for color change, rash, hair loss, skin tightness, ulcers and sensitivity to sunlight.  Allergic/Immunologic: Negative for susceptible to infections.  Neurological: Negative for dizziness, memory loss and night sweats.  Hematological: Negative for swollen glands.  Psychiatric/Behavioral: Negative for depressed mood and sleep disturbance. The patient is not nervous/anxious.     PMFS History:  Patient Active Problem List   Diagnosis Date Noted  . Malignant neoplasm of lower-inner quadrant of left breast in female, estrogen receptor positive ([place]) 10/04/2016  . Osteoporosis 10/04/2016  . Malignant neoplasm of upper-inner quadrant of left breast in female, estrogen receptor positive ([place]) 08/26/2016  . High risk medication use 06/08/2016  . Age-related osteoporosis without current pathological fracture 06/08/2016  . ANA positive 06/08/2016  . Moderate persistent asthma 06/08/2016  . Normocytic anemia 10/01/2014  . Rheumatoid arthritis of multiple sites without organ or system involvement with positive rheumatoid factor ([place]) 10/01/2014  . Subcapital fracture of left hip ([place]) 10/01/2014    Past Medical History:  Diagnosis Date  . Anemia   . Arthritis    rheumatoid +CCP neg RF +ANA synovitis   . Asthma   . Cancer [facility])    Breast cancer  . Glaucoma    right eye  . Osteoporosis   . Personal history of radiation therapy 09/2016    Family History  Problem Relation Age of Onset  . Hypertension Father    Past Surgical History:  Procedure Laterality Date  . BREAST BIOPSY Left 05/14/2016   x2  . BREAST BIOPSY Right 05/14/2016  . BREAST LUMPECTOMY Left 07/09/2016  . BREAST LUMPECTOMY WITH  RADIOACTIVE SEED AND SENTINEL LYMPH NODE BIOPSY Left 07/09/2016   Procedure: BREAST LUMPECTOMY WITH RADIOACTIVE SEED AND SENTINEL LYMPH NODE BIOPSY;  Surgeon: Coralie Keens, MD;  Location: [facility];  Service: General;  Laterality: Left;  . NO PAST SURGERIES    . TOTAL HIP ARTHROPLASTY Left  10/01/2014   Procedure: TOTAL HIP ARTHROPLASTY;  Surgeon: Garald Balding, MD;  Location: [facility];  Service: Orthopedics;  Laterality: Left;   Social History   Social History Narrative  . Not on file     Objective: Vital Signs: BP 139/81 (BP Location: Left Arm, Patient Position: Sitting, Cuff Size: Normal)   Pulse 87   Resp 15   Ht 5' (1.524 m)   Wt 144 lb (65.3 kg)   BMI 28.12 kg/m    Physical Exam  Constitutional: She is oriented to person, place, and time. She appears well-developed and well-nourished.  HENT:  Head: Normocephalic and atraumatic.  Eyes: Conjunctivae and EOM are normal.  Neck: Normal range of motion.  Cardiovascular: Normal rate, regular rhythm, normal heart sounds and intact distal pulses.  Pulmonary/Chest: Effort normal and breath sounds normal.  Abdominal: Soft. Bowel sounds are normal.  Lymphadenopathy:    She has no cervical adenopathy.  Neurological: She is alert and oriented to person, place, and time.  Skin: Skin is warm and dry. Capillary refill takes less than 2 seconds.  Psychiatric: She has a normal mood and affect. Her behavior is normal.  Nursing note and vitals reviewed.    Musculoskeletal Exam: C-spine and thoracic lumbar spine good range of motion. She is some thoracic kyphosis. Shoulder joints elbow joints with good range of motion. She has limited range of motion wrist joint without synovitis. MCPs PIPs DIPs with good range of motion with no synovitis. Hip joints knee joints ankles MTPs PIPs DIPs with good range of motion with no synovitis.  CDAI Exam: CDAI Homunculus Exam:   Joint Counts:  CDAI Tender Joint count: 0 CDAI Swollen Joint count: 0  Global Assessments:  Patient Global Assessment: 3 Provider Global Assessment: 2  CDAI Calculated Score: 5    Investigation: No additional findings.PLQ eye exam: 08/16/2016 CBC Latest Ref Rng & Units 04/18/2017 04/13/2017 03/14/2017  WBC 3.9 - 10.3 10e3/uL 7.1 7.0 7.0  Hemoglobin 11.6 -  15.9 g/dL 9.9(L) 9.8(L) 9.7(L)  Hematocrit 34.8 - 46.6 % 31.5(L) 30.8(L) 31.0(L)  Platelets 145 - 400 10e3/uL 275 278 338   CMP Latest Ref Rng & Units 04/18/2017 04/13/2017 03/14/2017  Glucose 70 - 140 mg/dl 103 92 91  BUN 7.0 - 26.0 mg/dL 11._0 Creatinine 0.6 - 1.1 mg/dL 0.7 0.67 0.70  Sodium 136 - 145 mEq/L 137 136 136  Potassium 3.5 - 5.1 mEq/L 3.7 3.6 3.7  Chloride 101 - 111 mmol/L - 101 100(L)  CO2 22 - 29 mEq/L _1 Calcium 8.4 - 10.4 mg/dL 9.2 9.1 9.1  Total Protein 6.4 - 8.3 g/dL 6.9 6.2(L) 6.4(L)  Total Bilirubin 0.20 - 1.20 mg/dL 0.27 0.5 0.5  Alkaline Phos 40 - 150 U/L 77 68 78  AST 5 - 34 U/L _2 ALT 0 - 55 U/L _3 Imaging: Mm Diag Breast Tomo Bilateral  Result Date: 05/25/2017 CLINICAL DATA:  77 year old who underwent malignant lumpectomy of the lower inner quadrant of the left breast in February, 2018, pathology grade 2-3 invasive ductal carcinoma with lymphovascular invasion.Adjuvant radiation therapy. Initial post lumpectomy annual evaluation. EXAM: 2D DIGITAL DIAGNOSTIC BILATERAL  MAMMOGRAM WITH CAD AND ADJUNCT TOMO COMPARISON:  07/09/2016 (right), 07/08/2015 (left), 05/14/2016 (bilateral) and earlier. ACR Breast Density Category c: The breast tissue is heterogeneously dense, which may obscure small masses. FINDINGS: Standard 2D and tomosynthesis full field CC and MLO views of both breasts were obtained. A standard spot magnification CC view of the lumpectomy site in the left breast was also obtained. Post surgical scar/architectural distortion at the lumpectomy site in the lower inner left breast at anterior to middle depth. Post radiation skin thickening and trabecular thickening throughout the left breast. No findings suspicious for malignancy in the left breast. No findings suspicious for malignancy in the right breast. Mammographic images were processed with CAD. IMPRESSION: 1. No mammographic evidence of malignancy involving either breast. 2.  Expected post lumpectomy and post radiation changes involving the left breast. RECOMMENDATION: Bilateral diagnostic mammography in 1 year. I have discussed the findings and recommendations with the patient. Results were also provided in writing at the conclusion of the visit. If applicable, a reminder letter will be sent to the patient regarding the next appointment. BI-RADS CATEGORY  2: Benign. Electronically Signed   By: Evangeline Dakin M.D.   On: 05/25/2017 14:17    Speciality Comments: Orencia IV every 4 weeks TB gold negative 03/10/16 (ordered with next infusion in October 2018)    Procedures:  No procedures performed Allergies: Penicillins and Sulfa antibiotics   Assessment / Plan:     Visit Diagnoses: Rheumatoid arthritis of multiple sites without organ or system involvement with positive rheumatoid factor (HCC) - Positive RF, positive anti-CCP, positive ANA, elevated ESR. She's doing much better on IV Orencia and Arava combination. She no synovitis on examination. She denies any arthralgias.. Detailed discussion regarding prednisone taper. Side effects contraindications of long-term use of prednisone were discussed. I would like to taper her by 1 mg every month of prednisone until she completely comes off the medication. If she has a flare in the meantime she supposed to notify us.  High risk medication use - Orencia IV, Arava 20 mg by mouth daily, prednisone 5 mg by mouth daily. Her labs at been stable. She's chronic anemia. She's been getting iron infusions to oncologist.  ANA positive  Age-related osteoporosis without current pathological fracture - on Evista and Prolia subcutaneous now by her oncologist.  Malignant neoplasm of upper-inner quadrant of left breast in female, estrogen receptor positive ([place])  History of asthma  History of anemia    Orders: No orders of the defined types were placed in this encounter.  Meds ordered this encounter  Medications  . predniSONE  (DELTASONE) 1 MG tablet    Sig: Take 4 mg per day x1 month, then take 53m per day x 1 month    Dispense:  210 tablet    Refill:  0   Association of heart disease with rheumatoid arthritis was discussed. Need to monitor blood pressure, cholesterol, and to exercise 30-60 minutes on daily basis was discussed. Poor dental hygiene can be a predisposing factor for rheumatoid arthritis. Good dental hygiene was discussed. Face-to-face time spent with patient was 30 minutes. Greater than 50% of time was spent in counseling and coordination of care.  Follow-Up Instructions: Return in about 5 months (around 11/13/2017) for Rheumatoid arthritis.   SBo Merino MD  Note - This record has been created using DEditor, commissioning  Chart creation errors have been sought, but may not always  have been located. Such creation errors do not reflect on  the standard of  medical care.

## 2017-06-08 ENCOUNTER — Other Ambulatory Visit: Payer: Self-pay | Admitting: *Deleted

## 2017-06-08 DIAGNOSIS — M0579 Rheumatoid arthritis with rheumatoid factor of multiple sites without organ or systems involvement: Secondary | ICD-10-CM

## 2017-06-12 ENCOUNTER — Other Ambulatory Visit: Payer: Self-pay | Admitting: Hematology

## 2017-06-13 ENCOUNTER — Other Ambulatory Visit: Payer: Self-pay

## 2017-06-13 ENCOUNTER — Encounter (HOSPITAL_COMMUNITY): Payer: Medicare Other

## 2017-06-13 MED ORDER — RALOXIFENE HCL 60 MG PO TABS: 60.0000 mg | ORAL_TABLET | Freq: Every day | ORAL | 1 refills | Status: DC

## 2017-06-15 ENCOUNTER — Encounter (HOSPITAL_COMMUNITY): Payer: Medicare Other

## 2017-06-15 ENCOUNTER — Encounter: Payer: Self-pay | Admitting: Rheumatology

## 2017-06-15 ENCOUNTER — Ambulatory Visit (INDEPENDENT_AMBULATORY_CARE_PROVIDER_SITE_OTHER): Payer: Medicare Other | Admitting: Rheumatology

## 2017-06-15 VITALS — BP 139/81 | HR 87 | Resp 15 | Ht 60.0 in | Wt 144.0 lb

## 2017-06-15 DIAGNOSIS — Z79899 Other long term (current) drug therapy: Secondary | ICD-10-CM | POA: Diagnosis not present

## 2017-06-15 DIAGNOSIS — C50212 Malignant neoplasm of upper-inner quadrant of left female breast: Secondary | ICD-10-CM

## 2017-06-15 DIAGNOSIS — Z170 Estrogen receptor positive status [ER+]: Secondary | ICD-10-CM | POA: Diagnosis not present

## 2017-06-15 DIAGNOSIS — R768 Other specified abnormal immunological findings in serum: Secondary | ICD-10-CM

## 2017-06-15 DIAGNOSIS — Z862 Personal history of diseases of the blood and blood-forming organs and certain disorders involving the immune mechanism: Secondary | ICD-10-CM

## 2017-06-15 DIAGNOSIS — M810 Age-related osteoporosis without current pathological fracture: Secondary | ICD-10-CM

## 2017-06-15 DIAGNOSIS — Z8709 Personal history of other diseases of the respiratory system: Secondary | ICD-10-CM | POA: Diagnosis not present

## 2017-06-15 DIAGNOSIS — M0579 Rheumatoid arthritis with rheumatoid factor of multiple sites without organ or systems involvement: Secondary | ICD-10-CM

## 2017-06-15 DIAGNOSIS — R7689 Other specified abnormal immunological findings in serum: Secondary | ICD-10-CM

## 2017-06-15 MED ORDER — PREDNISONE 1 MG PO TABS: ORAL_TABLET | ORAL | 0 refills | Status: DC

## 2017-06-15 NOTE — Patient Instructions (Signed)
Prednisone taper  Take 4 mg per day x1 month, then take 3mg  per day x 1 month, then take 2 mg per day x 1 month, then 1 mg per day x 1 month.

## 2017-06-17 NOTE — Telephone Encounter (Signed)
Called [employer] to see if pt would require a pre-cert for infusions. Spoke with Jovie who states that the pt only has in-network coverage and no pre-cert is required.   Phone: [phone]  Demetrios Loll, CPhT 4:36 PM

## 2017-06-20 ENCOUNTER — Ambulatory Visit (HOSPITAL_COMMUNITY)
Admission: RE | Admit: 2017-06-20 | Discharge: 2017-06-20 | Disposition: A | Discharge status: Home / Self Care | Payer: Medicare Other | Source: Ambulatory Visit | Attending: Rheumatology | Admitting: Rheumatology

## 2017-06-20 DIAGNOSIS — M0579 Rheumatoid arthritis with rheumatoid factor of multiple sites without organ or systems involvement: Secondary | ICD-10-CM | POA: Diagnosis not present

## 2017-06-20 MED ORDER — SODIUM CHLORIDE 0.9 % IV SOLN
750.0000 mg | INTRAVENOUS | Status: DC
  Administered 2017-06-20: 750 mg via INTRAVENOUS
  Filled 2017-06-20: qty 30

## 2017-06-20 MED ORDER — DIPHENHYDRAMINE HCL 25 MG PO CAPS
ORAL_CAPSULE | ORAL | Status: AC
  Filled 2017-06-20: qty 1

## 2017-06-20 MED ORDER — ACETAMINOPHEN 325 MG PO TABS
650.0000 mg | ORAL_TABLET | ORAL | Status: DC
  Administered 2017-06-20: 650 mg via ORAL

## 2017-06-20 MED ORDER — DIPHENHYDRAMINE HCL 25 MG PO CAPS
25.0000 mg | ORAL_CAPSULE | ORAL | Status: DC
  Administered 2017-06-20: 25 mg via ORAL

## 2017-06-20 MED ORDER — ACETAMINOPHEN 325 MG PO TABS
ORAL_TABLET | ORAL | Status: AC
  Filled 2017-06-20: qty 2

## 2017-07-08 ENCOUNTER — Other Ambulatory Visit: Payer: Self-pay | Admitting: *Deleted

## 2017-07-08 NOTE — Progress Notes (Signed)
Infusion orders are current for patient CBC CMP Tylenol Benadryl appointments are up to date and follow up appointment  is scheduled TB gold not due yet.  

## 2017-07-14 NOTE — Progress Notes (Signed)
Felicia Gonzalez  HEMATOLOGY ONCOLOGY CLINIC NOTE  Date of service: 07/18/17   Patient Care Team: Vernie Shanks, MD as PCP - General (Family Medicine)  Chief Complaint:  F/u for ER/PR positive Her neg Breast cancer  Diagnosis  Stage I (pT1b, pN0, Mx) left sided invasive ductal carcinoma of the breast. 1 cm in size with lymphovascular invasion grade 2/3, Ki-67 15% ER/PR positive HER-2/neu negative. One Sentinel lymph node biopsy negative No DCIS. Severe calcified benign lesions in left breast with PASH (pseudoangiomatous stromal hyperplasia).  Patient is status post left breast lumpectomy by Dr. Ninfa Linden on 07/09/2016.   S/p adjuvant  Left breast RT on 10/05/2016   INTERVAL HISTORY:  Felicia Gonzalez is here for her scheduled follow-up for stage I left breast cancer. The patient's last visit with [place] was on 04/18/17. The pt reports that she is doing well overall and had a fun trip to [place] over the holidays. She is excited about a few weddings that are coming up this year.  She reports not having any problems with the IV iron. She notes occasional hot flashes- grade 1. She reports regularly taking her Vitamin D. Pt continues to take Prolia every 6 months. She also reports continuing her Orencia. She notes no problems taking the Evista as well. She also notes tapering Prednisone currently at 3 mg.   Of note since the patient last visit, pt has had mammogram completed on 05/25/17 with results revealing 1. No mammographic evidence of malignancy involving either breast. 2. Expected post lumpectomy and post radiation changes involving the left breast.  Lab results today (07/18/17) of CBC, CMP, and Reticulocytes is as follows: all values are WNL except for RBC at 3.69, Hgb at 10.6, HCT at 32.8, RDW at 16.1, and Albumin at 3.3. Vitamin B12- 686, Ferritin improved to 380  On review of systems, pt reports some recent constipation, and denies any new breast symptoms, mouth sores, back pain, pain along the  spine, abdominal pains, and any other symptoms.   REVIEW OF SYSTEMS:    .10 Point review of Systems was done is negative except as noted above. . Past Medical History:  Diagnosis Date  . Anemia   . Arthritis    rheumatoid +CCP neg RF +ANA synovitis   . Asthma   . Cancer [facility])    Breast cancer  . Glaucoma    right eye  . Osteoporosis   . Personal history of radiation therapy 09/2016     . Past Surgical History:  Procedure Laterality Date  . BREAST BIOPSY Left 05/14/2016   x2  . BREAST BIOPSY Right 05/14/2016  . BREAST LUMPECTOMY Left 07/09/2016  . BREAST LUMPECTOMY WITH RADIOACTIVE SEED AND SENTINEL LYMPH NODE BIOPSY Left 07/09/2016   Procedure: BREAST LUMPECTOMY WITH RADIOACTIVE SEED AND SENTINEL LYMPH NODE BIOPSY;  Surgeon: Coralie Keens, MD;  Location: [facility];  Service: General;  Laterality: Left;  . NO PAST SURGERIES    . TOTAL HIP ARTHROPLASTY Left 10/01/2014   Procedure: TOTAL HIP ARTHROPLASTY;  Surgeon: Garald Balding, MD;  Location: [facility];  Service: Orthopedics;  Laterality: Left;    . Social History   Tobacco Use  . Smoking status: Never Smoker  . Smokeless tobacco: Never Used  Substance Use Topics  . Alcohol use: No  . Drug use: No    ALLERGIES:  is allergic to penicillins and sulfa antibiotics.  MEDICATIONS:  Current Outpatient Medications  Medication Sig Dispense Refill  . Abatacept (ORENCIA IV) Inject 750 mg into the  vein every 30 (thirty) days.    Felicia Gonzalez acetaminophen (TYLENOL) 650 MG CR tablet Take 1,300 mg by mouth 2 (two) times daily as needed for pain. Take one hour prior to infusion     . albuterol (PROVENTIL HFA;VENTOLIN HFA) 108 (90 Base) MCG/ACT inhaler Inhale 1-2 puffs into the lungs every 6 (six) hours as needed for wheezing or shortness of breath.    Felicia Gonzalez amLODipine (NORVASC) 5 MG tablet Take 5 mg by mouth daily.     Felicia Gonzalez aspirin EC 81 MG tablet Take 81 mg by mouth daily.    . Cholecalciferol (VITAMIN D3) 5000 units TABS Take 5,000 Units by mouth  daily after breakfast.    . hydroxychloroquine (PLAQUENIL) 200 MG tablet TAKE 1 TABLET BY MOUTH TWICE A DAY MONDAY THROUGHT FRIDAY (Patient not taking: Reported on 06/15/2017) 135 tablet 1  . leflunomide (ARAVA) 20 MG tablet TAKE 1 TABLET BY MOUTH EVERY DAY 90 tablet 0  . Multiple Vitamin (MULTIVITAMIN WITH MINERALS) TABS tablet Take 1 tablet by mouth daily.    . Omega-3 Fatty Acids (OMEGA-3 FISH OIL) 1200 MG CAPS Take 1,200 mg by mouth 2 (two) times daily.    Vladimir Faster Glycol-Propyl Glycol (SYSTANE OP) Place 1 drop into the left eye at bedtime.    . Polysaccharide Iron Complex 15 MG/0.5ML LIQD Take 150 mg by mouth daily. (Patient not taking: Reported on 06/15/2017) 150 mL 1  . predniSONE (DELTASONE) 1 MG tablet Take 4 mg per day x1 month, then take '3mg'$  per day x 1 month 210 tablet 0  . predniSONE (DELTASONE) 5 MG tablet Take 1 tablet (5 mg total) by mouth daily with breakfast. 90 tablet 1  . raloxifene (EVISTA) 60 MG tablet Take 1 tablet (60 mg total) by mouth daily. 30 tablet 1  . saccharomyces boulardii (FLORASTOR) 250 MG capsule Take 250 mg by mouth 2 (two) times daily.    . TRAVATAN Z 0.004 % SOLN ophthalmic solution Place 1 drop into the right eye at bedtime.    . TURMERIC PO Take 1 capsule by mouth daily after breakfast.     No current facility-administered medications for this visit.     PHYSICAL EXAMINATION: ECOG PERFORMANCE STATUS: 1 - Symptomatic but completely ambulatory  . Vitals:   07/18/17 1447  BP: 138/78  Pulse: (!) 104  Resp: 18  Temp: 98.8 F (37.1 C)  SpO2: 99%    Filed Weights   07/18/17 1447  Weight: 143 lb 6.4 oz (65 kg)   .Body mass index is 28.01 kg/m.  Felicia Gonzalez GENERAL:alert, in no acute distress and comfortable SKIN: no acute rashes, no significant lesions EYES: conjunctiva are pink and non-injected, sclera anicteric OROPHARYNX: MMM, no exudates, no oropharyngeal erythema or ulceration NECK: supple, no JVD LYMPH:  no palpable lymphadenopathy in the  cervical, axillary or inguinal regions LUNGS: clear to auscultation b/l with normal respiratory effort HEART: regular rate & rhythm ABDOMEN:  normoactive bowel sounds , non tender, not distended. Extremity: no pedal edema PSYCH: alert & oriented x 3 with fluent speech NEURO: no focal motor/sensory deficits   LABORATORY DATA:   I have reviewed the data as listed  . CBC Latest Ref Rng & Units 07/18/2017 04/18/2017 04/13/2017  WBC 3.9 - 10.3 K/uL 5.7 7.1 7.0  Hemoglobin 11.6 - 15.9 g/dL - 9.9(L) 9.8(L)  Hematocrit 34.8 - 46.6 % 32.8(L) 31.5(L) 30.8(L)  Platelets 145 - 400 K/uL 265 275 278   hgb 10.6 up from 9.9 . CMP Latest Ref Rng &  Units 04/18/2017 04/13/2017 03/14/2017  Glucose 70 - 140 mg/dl 103 92 91  BUN 7.0 - 26.0 mg/dL 11.'1 9 8  '$ Creatinine 0.6 - 1.1 mg/dL 0.7 0.67 0.70  Sodium 136 - 145 mEq/L 137 136 136  Potassium 3.5 - 5.1 mEq/L 3.7 3.6 3.7  Chloride 101 - 111 mmol/L - 101 100(L)  CO2 22 - 29 mEq/L '29 27 26  '$ Calcium 8.4 - 10.4 mg/dL 9.2 9.1 9.1  Total Protein 6.4 - 8.3 g/dL 6.9 6.2(L) 6.4(L)  Total Bilirubin 0.20 - 1.20 mg/dL 0.27 0.5 0.5  Alkaline Phos 40 - 150 U/L 77 68 78  AST 5 - 34 U/L '26 30 29  '$ ALT 0 - 55 U/L '30 31 27    25 '$ OH vit D - 54.7---> 49.2  . Lab Results  Component Value Date   IRON 81 04/18/2017   TIBC 313 04/18/2017   IRONPCTSAT 26 04/18/2017   (Iron and TIBC)  Lab Results  Component Value Date   FERRITIN 380 (H) 07/18/2017   B12- 686        Microscopic Comment 1. BREAST, INVASIVE TUMOR Procedure: Seed localized lumpectomy. Laterality: Left breast. Tumor Size (gross measurement): 1.0 cm Histologic Type: Ductal Grade: II Tubular Differentiation: 3 Nuclear Pleomorphism: 2 Mitotic Count: 1 Ductal Carcinoma in Situ (DCIS): Not identified Extent of Tumor: Confined to breast parenchyma Margins: Invasive carcinoma, distance from closest margin: Focally 0.1 cm to the anterior margin Regional Lymph Nodes: Number of Lymph Nodes  Examined: 1 Number of Sentinel Lymph Nodes Examined: 1 Lymph Nodes with Macrometastases: 0 Lymph Nodes with Micrometastases: 0 Lymph Nodes with Isolated Tumor Cells: 0 Breast Prognostic Profile: [id number] Estrogen Receptor: 100%, strong Progesterone Receptor: 5%, strong Her2: No amplification was detected. The ratio was 1.27 1 of 3 FINAL for KYELLE, URBAS B [phone]) Microscopic Comment(continued) Ki-67: 15% Pathologic Stage Classification (pTNM, AJCC 8th Edition): Primary Tumor (pT): pT1b Regional Lymph Nodes (pN): pN0 (JBK:ecj 07/12/2016) Enid Cutter MD Pathologist, Electronic Signature (Case signed 07/12/2016)     CLINICAL DATA:  Followup for probably benign right breast mass and probably benign left breast calcifications.  EXAM: 2D DIGITAL DIAGNOSTIC BILATERAL MAMMOGRAM WITH CAD AND ADJUNCT TOMO  BILATERAL BREAST ULTRASOUND  COMPARISON:  Previous exam(s).  ACR Breast Density Category c: The breast tissue is heterogeneously dense, which may obscure small masses.  FINDINGS: No suspicious masses or calcifications are seen in the right breast. The oval circumscribed mass in the upper-outer posterior right breast appears unchanged. Spot compression magnification views were performed over the inner left breast demonstrating unchanged 3 mm group of punctate calcifications. There is a mass with irregular margins in the lower inner left breast measuring approximately 7-8 mm.  Mammographic images were processed with CAD.  Physical examination of the left breast reveals a firm nodule at the 8 o'clock position.  Targeted ultrasound of the left breast was performed demonstrating an irregular hypoechoic mass at 8 o'clock 2 cm from nipple measuring 0.8 x 0.6 x 0.7 cm. This corresponds well with the mass seen at mammography. No lymphadenopathy seen in the left axilla.  Targeted ultrasound of the right breast was performed demonstrating a mixed echogenicity  mass at 10 o'clock 8 cm from nipple measuring 1.4 x 0.6 x 1.1 cm. Although this may represent an area of fat necrosis is indeterminate. No lymphadenopathy seen in the right axilla.  IMPRESSION: 1. Suspicious mass in the left breast at the 8 o'clock position.  2. Stable appearance of punctate calcifications in the lower inner  left breast.  3. Indeterminate mass in the right breast at 10 o'clock 8 cm from the nipple which cannot be clearly characterized as an area of fat necrosis.  RECOMMENDATION: 1. Recommend ultrasound-guided biopsy of the mass in the left breast at the 8 o'clock position.  2. Recommend stereotactic guided biopsy of the calcifications in the lower inner left breast (given the suspicious mass seen in close proximity to the calcifications).  3. Recommend ultrasound-guided biopsy of the mass in the right breast at 10 o'clock position.  Biopsies are being scheduled for the patient.  I have discussed the findings and recommendations with the patient. Results were also provided in writing at the conclusion of the visit. If applicable, a reminder letter will be sent to the patient regarding the next appointment.  BI-RADS CATEGORY  4: Suspicious.   Electronically Signed   By: Everlean Alstrom M.D.   On: 05/12/2016 13:56   RADIOGRAPHIC STUDIES: I have personally reviewed the radiological images as listed and agreed with the findings in the report. No results found.  ASSESSMENT & PLAN:   77 y.o.  female with  #1 Stage I (pT1b, pN0, Mx) left sided invasive ductal carcinoma of the breast. 1 cm in size with lymphovascular invasion grade 2/3, Ki-67 15% ER/PR positive HER-2/neu negative. One Sentinel lymph node biopsy negative No DCIS. Severe calcified benign lesions in left breast with PASH (pseudoangiomatous stromal hyperplasia).  Patient had left breast lumpectomy by Dr. Ninfa Linden on 07/09/2016.  She completed her adjuvant radiation therapy on  09/2016  Has been on Raloxifene since then for adjuvant endocrine therapy. (AI not considered due to severe osteoporosis with fractures)  #2 Severe osteoporosis - multiple risk factors including chronic steroid therapy, age, rheumatoid arthritis. Her bone density study shows significant osteoporosis despite being on Fosamax for 2 years. Last vitamin D levels are adequate at 49 She has had an osteoporotic left hip fracture around May 2016.  Plan -patient has no clinical or mammographic evidence of breast cancer recurrence at this time -grade 1 hot flashes but no other prohibitive treatment toxicities -continue Raloxifene -continue ASA '81mg'$  po daly -continue Prolia q75month for osteoporosis -She is currently on vitamin D 5000 units daily. Would continue this and ensure intake of at least 1000 mg of calcium daily.  -Discussed pt labwork today and her most recent MMG which revealed no evidence of malignancy involving either breast.    #2 Rheumatoid arthritis on Orencia per rheumatology Plan -Continue follow-up with Dr. DEstanislado Pandyfrom rheumatology for ongoing treatments. -is being tapered off her prednisone and is down to '3mg'$  po daily  #3 Anemia of chronic disease due to chronic inflammation from RA  -Ferritin levels up to 380 hgb improved to 10.6 Plan -no indication for additional IV Iron at this time -continue to optimize RA management.  -continue Prolia q6 months (next dose 10/02/2017) -RTC with Dr KIrene Limboin 4 months with labs   The total time spent in the appointment was 25 minutes and more than 50% was on counseling and direct patient cares.    GSullivan LoneMD MRockvilleAAHIVMS SSwedish Medical Center - Cherry Hill CampusCMerritt Island Outpatient Surgery CenterHematology/Oncology Physician [place] (Office):       [phone](Work cell):  [phone](Fax):           [phone] This document serves as a record of services personally performed by GSullivan Lone MD. It was created on his behalf by SBaldwin Jamaica a trained medical scribe.  The creation of this record is based on  the scribe's personal observations and the provider's statements to them.   .I have reviewed the above documentation for accuracy and completeness, and I agree with the above. [name] MD Felicia

## 2017-07-18 ENCOUNTER — Encounter: Payer: Self-pay | Admitting: Hematology

## 2017-07-18 ENCOUNTER — Inpatient Hospital Stay: Payer: Medicare Other | Attending: Hematology | Admitting: Hematology

## 2017-07-18 ENCOUNTER — Inpatient Hospital Stay: Payer: Medicare Other

## 2017-07-18 VITALS — BP 138/78 | HR 104 | Temp 98.8°F | Resp 18 | Ht 60.0 in | Wt 143.4 lb

## 2017-07-18 DIAGNOSIS — D638 Anemia in other chronic diseases classified elsewhere: Secondary | ICD-10-CM | POA: Diagnosis not present

## 2017-07-18 DIAGNOSIS — C50312 Malignant neoplasm of lower-inner quadrant of left female breast: Secondary | ICD-10-CM | POA: Diagnosis not present

## 2017-07-18 DIAGNOSIS — D509 Iron deficiency anemia, unspecified: Secondary | ICD-10-CM

## 2017-07-18 DIAGNOSIS — Z170 Estrogen receptor positive status [ER+]: Secondary | ICD-10-CM | POA: Diagnosis not present

## 2017-07-18 DIAGNOSIS — M810 Age-related osteoporosis without current pathological fracture: Secondary | ICD-10-CM | POA: Diagnosis not present

## 2017-07-18 DIAGNOSIS — M069 Rheumatoid arthritis, unspecified: Secondary | ICD-10-CM | POA: Insufficient documentation

## 2017-07-18 DIAGNOSIS — C50212 Malignant neoplasm of upper-inner quadrant of left female breast: Secondary | ICD-10-CM

## 2017-07-18 LAB — CBC WITH DIFFERENTIAL (CANCER CENTER ONLY)
BASOS PCT: 0 %
Basophils Absolute: 0 10*3/uL (ref 0.0–0.1)
EOS ABS: 0.3 10*3/uL (ref 0.0–0.5)
Eosinophils Relative: 5 %
HCT: 32.8 % — ABNORMAL LOW (ref 34.8–46.6)
Hemoglobin: 10.6 g/dL — ABNORMAL LOW (ref 11.6–15.9)
LYMPHS ABS: 1.6 10*3/uL (ref 0.9–3.3)
Lymphocytes Relative: 27 %
MCH: 28.7 pg (ref 25.1–34.0)
MCHC: 32.3 g/dL (ref 31.5–36.0)
MCV: 88.9 fL (ref 79.5–101.0)
Monocytes Absolute: 0.6 10*3/uL (ref 0.1–0.9)
Monocytes Relative: 11 %
NEUTROS ABS: 3.2 10*3/uL (ref 1.5–6.5)
NEUTROS PCT: 57 %
Platelet Count: 265 10*3/uL (ref 145–400)
RBC: 3.69 MIL/uL — AB (ref 3.70–5.45)
RDW: 16.1 % — ABNORMAL HIGH (ref 11.2–14.5)
WBC: 5.7 10*3/uL (ref 3.9–10.3)

## 2017-07-18 LAB — COMPREHENSIVE METABOLIC PANEL
ALBUMIN: 3.3 g/dL — AB (ref 3.5–5.0)
ALT: 29 U/L (ref 0–55)
ANION GAP: 11 (ref 3–11)
AST: 25 U/L (ref 5–34)
Alkaline Phosphatase: 87 U/L (ref 40–150)
BILIRUBIN TOTAL: 0.3 mg/dL (ref 0.2–1.2)
BUN: 10 mg/dL (ref 7–26)
CHLORIDE: 99 mmol/L (ref 98–109)
CO2: 27 mmol/L (ref 22–29)
Calcium: 9.6 mg/dL (ref 8.4–10.4)
Creatinine, Ser: 0.8 mg/dL (ref 0.60–1.10)
GFR calc Af Amer: >60 mL/min (ref >60)
GFR calc non Af Amer: >60 mL/min (ref >60)
GLUCOSE: 117 mg/dL (ref 70–140)
POTASSIUM: 3.6 mmol/L (ref 3.5–5.1)
SODIUM: 137 mmol/L (ref 136–145)
TOTAL PROTEIN: 6.7 g/dL (ref 6.4–8.3)

## 2017-07-18 LAB — RETICULOCYTES
RBC.: 3.69 MIL/uL — AB (ref 3.70–5.45)
RETIC CT PCT: 1.4 % (ref 0.7–2.1)
Retic Count, Absolute: 51.7 10*3/uL (ref 33.7–90.7)

## 2017-07-18 LAB — FERRITIN: FERRITIN: 380 ng/mL — AB (ref 9–269)

## 2017-07-18 LAB — VITAMIN B12: Vitamin B-12: 686 pg/mL (ref 180–914)

## 2017-07-18 NOTE — Patient Instructions (Signed)
Thank you for choosing [facility] to provide your oncology and hematology care.  To afford each patient quality time with our providers, please arrive 30 minutes before your scheduled appointment time.  If you arrive late for your appointment, you may be asked to reschedule.  We strive to give you quality time with our providers, and arriving late affects you and other patients whose appointments are after yours.   If you are a no show for multiple scheduled visits, you may be dismissed from the clinic at the providers discretion.    Again, thank you for choosing [facility], our hope is that these requests will decrease the amount of time that you wait before being seen by our physicians.  ______________________________________________________________________  Should you have questions after your visit to the [facility], please contact our office at [phone] between the hours of 8:30 and 4:30 p.m.    Voicemails left after 4:30p.m will not be returned until the following business day.    For prescription refill requests, please have your pharmacy contact us directly.  Please also try to allow 48 hours for prescription requests.    Please contact the scheduling department for questions regarding scheduling.  For scheduling of procedures such as PET scans, CT scans, MRI, Ultrasound, etc please contact central scheduling at [phone].    Resources For Cancer Patients and Caregivers:   Oncolink.org:  A wonderful resource for patients and healthcare providers for information regarding your disease, ways to tract your treatment, what to expect, etc.     American Cancer Society:  800-227-2345  Can help patients locate various types of support and financial assistance  Cancer Care: 1-800-813-HOPE (4673) Provides financial assistance, online support groups, medication/co-pay assistance.    [place] DSS:  336-641-3447 Where to apply for food  stamps, Medicaid, and utility assistance  Medicare Rights Center: 800-333-4114 Helps people with Medicare understand their rights and benefits, navigate the Medicare system, and secure the quality healthcare they deserve  SCAT: 336-333-6589  [employer]'s shared-ride transportation service for eligible riders who have a disability that prevents them from riding the fixed route bus.    For additional information on assistance programs please contact our social worker:   Grier Hock/Abigail Elmore:  [phone]            

## 2017-07-19 LAB — VITAMIN D 25 HYDROXY (VIT D DEFICIENCY, FRACTURES): VIT D 25 HYDROXY: 49.2 ng/mL (ref 30.0–100.0)

## 2017-07-20 ENCOUNTER — Ambulatory Visit (HOSPITAL_COMMUNITY): Admission: RE | Admit: 2017-07-20 | Payer: Medicare Other | Source: Ambulatory Visit

## 2017-07-23 ENCOUNTER — Other Ambulatory Visit: Payer: Self-pay | Admitting: Rheumatology

## 2017-07-28 ENCOUNTER — Ambulatory Visit (HOSPITAL_COMMUNITY)
Admission: RE | Admit: 2017-07-28 | Discharge: 2017-07-28 | Disposition: A | Discharge status: Home / Self Care | Payer: Medicare Other | Source: Ambulatory Visit | Attending: Rheumatology | Admitting: Rheumatology

## 2017-07-28 DIAGNOSIS — M0579 Rheumatoid arthritis with rheumatoid factor of multiple sites without organ or systems involvement: Secondary | ICD-10-CM | POA: Diagnosis not present

## 2017-07-28 MED ORDER — ACETAMINOPHEN 325 MG PO TABS: 650.0000 mg | ORAL_TABLET | ORAL | Status: DC

## 2017-07-28 MED ORDER — SODIUM CHLORIDE 0.9 % IV SOLN
750.0000 mg | INTRAVENOUS | Status: DC
  Administered 2017-07-28: 750 mg via INTRAVENOUS
  Filled 2017-07-28: qty 30

## 2017-07-28 MED ORDER — DIPHENHYDRAMINE HCL 25 MG PO CAPS
ORAL_CAPSULE | ORAL | Status: AC
  Administered 2017-07-28: 25 mg via ORAL
  Filled 2017-07-28: qty 1

## 2017-07-28 MED ORDER — DIPHENHYDRAMINE HCL 25 MG PO CAPS
25.0000 mg | ORAL_CAPSULE | ORAL | Status: DC
  Administered 2017-07-28: 25 mg via ORAL

## 2017-08-03 ENCOUNTER — Other Ambulatory Visit: Payer: Self-pay | Admitting: *Deleted

## 2017-08-03 NOTE — Progress Notes (Signed)
Infusion orders are current for patient CBC CMP Tylenol Benadryl appointments are up to date and follow up appointment  is scheduled TB gold not due yet.  

## 2017-08-07 ENCOUNTER — Other Ambulatory Visit: Payer: Self-pay | Admitting: Hematology

## 2017-08-08 ENCOUNTER — Telehealth: Payer: Self-pay | Admitting: Rheumatology

## 2017-08-08 NOTE — Telephone Encounter (Signed)
Patient called for prescription refill of Leflunomide to be sent to [facility] on [address].

## 2017-08-09 MED ORDER — LEFLUNOMIDE 20 MG PO TABS: 20.0000 mg | ORAL_TABLET | Freq: Every day | ORAL | 0 refills | Status: DC

## 2017-08-09 NOTE — Telephone Encounter (Signed)
Last Visit:  06/15/17 Next Visit: 11/07/17 Labs: 07/18/17 Stable  Okay to refill per Dr. Estanislado Pandy

## 2017-08-24 ENCOUNTER — Ambulatory Visit (HOSPITAL_COMMUNITY)
Admission: RE | Admit: 2017-08-24 | Discharge: 2017-08-24 | Disposition: A | Discharge status: Home / Self Care | Payer: Medicare Other | Source: Ambulatory Visit | Attending: Rheumatology | Admitting: Rheumatology

## 2017-08-24 DIAGNOSIS — M0579 Rheumatoid arthritis with rheumatoid factor of multiple sites without organ or systems involvement: Secondary | ICD-10-CM | POA: Insufficient documentation

## 2017-08-24 MED ORDER — DIPHENHYDRAMINE HCL 25 MG PO CAPS
25.0000 mg | ORAL_CAPSULE | ORAL | Status: DC
  Administered 2017-08-24: 25 mg via ORAL

## 2017-08-24 MED ORDER — SODIUM CHLORIDE 0.9 % IV SOLN
750.0000 mg | INTRAVENOUS | Status: DC
  Administered 2017-08-24: 750 mg via INTRAVENOUS
  Filled 2017-08-24: qty 30

## 2017-08-24 MED ORDER — DIPHENHYDRAMINE HCL 25 MG PO CAPS
ORAL_CAPSULE | ORAL | Status: AC
  Filled 2017-08-24: qty 1

## 2017-08-24 MED ORDER — ACETAMINOPHEN 325 MG PO TABS: 650.0000 mg | ORAL_TABLET | ORAL | Status: DC

## 2017-08-31 ENCOUNTER — Telehealth: Payer: Self-pay | Admitting: Rheumatology

## 2017-08-31 MED ORDER — PREDNISONE 1 MG PO TABS: 2.0000 mg | ORAL_TABLET | Freq: Every day | ORAL | 0 refills | Status: DC

## 2017-08-31 NOTE — Telephone Encounter (Signed)
Patient called requesting prescription refill of Prednisone 1 mg tablet.  [facility] is [facility] [address].

## 2017-08-31 NOTE — Telephone Encounter (Signed)
Last Visit:  06/15/17 Next Visit: 11/07/17  Okay to refill per Dr. Estanislado Pandy

## 2017-09-20 ENCOUNTER — Other Ambulatory Visit (HOSPITAL_COMMUNITY): Payer: Self-pay | Admitting: *Deleted

## 2017-09-20 ENCOUNTER — Other Ambulatory Visit: Payer: Self-pay | Admitting: *Deleted

## 2017-09-20 NOTE — Progress Notes (Signed)
Infusion orders are current for patient CBC CMP Tylenol Benadryl appointments are up to date and follow up appointment  is scheduled TB gold not due yet.  

## 2017-09-21 ENCOUNTER — Ambulatory Visit (HOSPITAL_COMMUNITY)
Admission: RE | Admit: 2017-09-21 | Discharge: 2017-09-21 | Disposition: A | Discharge status: Home / Self Care | Payer: Medicare Other | Source: Ambulatory Visit | Attending: Rheumatology | Admitting: Rheumatology

## 2017-09-21 DIAGNOSIS — M0579 Rheumatoid arthritis with rheumatoid factor of multiple sites without organ or systems involvement: Secondary | ICD-10-CM | POA: Diagnosis not present

## 2017-09-21 LAB — COMPREHENSIVE METABOLIC PANEL
ALBUMIN: 3.3 g/dL — AB (ref 3.5–5.0)
ALK PHOS: 69 U/L (ref 38–126)
ALT: 22 U/L (ref 14–54)
AST: 25 U/L (ref 15–41)
Anion gap: 8 (ref 5–15)
BUN: 9 mg/dL (ref 6–20)
CALCIUM: 8.7 mg/dL — AB (ref 8.9–10.3)
CHLORIDE: 99 mmol/L — AB (ref 101–111)
CO2: 26 mmol/L (ref 22–32)
CREATININE: 0.66 mg/dL (ref 0.44–1.00)
GFR calc non Af Amer: >60 mL/min (ref >60)
GLUCOSE: 88 mg/dL (ref 65–99)
Potassium: 3.7 mmol/L (ref 3.5–5.1)
SODIUM: 133 mmol/L — AB (ref 135–145)
Total Bilirubin: 0.5 mg/dL (ref 0.3–1.2)
Total Protein: 6.3 g/dL — ABNORMAL LOW (ref 6.5–8.1)

## 2017-09-21 LAB — CBC
HCT: 32.1 % — ABNORMAL LOW (ref 36.0–46.0)
Hemoglobin: 10.3 g/dL — ABNORMAL LOW (ref 12.0–15.0)
MCH: 28.2 pg (ref 26.0–34.0)
MCHC: 32.1 g/dL (ref 30.0–36.0)
MCV: 87.9 fL (ref 78.0–100.0)
PLATELETS: 277 10*3/uL (ref 150–400)
RBC: 3.65 MIL/uL — AB (ref 3.87–5.11)
RDW: 15.1 % (ref 11.5–15.5)
WBC: 4.6 10*3/uL (ref 4.0–10.5)

## 2017-09-21 MED ORDER — ACETAMINOPHEN 325 MG PO TABS: 650.0000 mg | ORAL_TABLET | ORAL | Status: DC

## 2017-09-21 MED ORDER — DIPHENHYDRAMINE HCL 25 MG PO CAPS
25.0000 mg | ORAL_CAPSULE | ORAL | Status: DC
  Administered 2017-09-21: 25 mg via ORAL

## 2017-09-21 MED ORDER — SODIUM CHLORIDE 0.9 % IV SOLN
750.0000 mg | INTRAVENOUS | Status: DC
  Administered 2017-09-21: 750 mg via INTRAVENOUS
  Filled 2017-09-21: qty 30

## 2017-09-21 MED ORDER — DIPHENHYDRAMINE HCL 25 MG PO CAPS
ORAL_CAPSULE | ORAL | Status: AC
  Filled 2017-09-21: qty 1

## 2017-09-21 NOTE — Progress Notes (Signed)
She should take calcium 1200 mg a day.  Rest of labs are stable.

## 2017-09-28 ENCOUNTER — Other Ambulatory Visit: Payer: Self-pay | Admitting: Rheumatology

## 2017-09-28 NOTE — Telephone Encounter (Signed)
Last visit: 06/15/17 Next Visit: 11/07/17  Okay to refill per Dr. Estanislado Pandy

## 2017-10-02 ENCOUNTER — Other Ambulatory Visit: Payer: Self-pay | Admitting: Hematology

## 2017-10-06 ENCOUNTER — Telehealth: Payer: Self-pay | Admitting: Hematology

## 2017-10-06 NOTE — Telephone Encounter (Signed)
Printed ROI for [employer] on 10/06/17, Release ID [id number]

## 2017-10-14 ENCOUNTER — Inpatient Hospital Stay: Payer: Medicare Other | Attending: Hematology

## 2017-10-14 ENCOUNTER — Ambulatory Visit: Payer: Medicare Other

## 2017-10-14 VITALS — BP 131/73 | HR 95 | Temp 98.5°F | Resp 17

## 2017-10-14 DIAGNOSIS — C50312 Malignant neoplasm of lower-inner quadrant of left female breast: Secondary | ICD-10-CM

## 2017-10-14 DIAGNOSIS — M810 Age-related osteoporosis without current pathological fracture: Secondary | ICD-10-CM | POA: Insufficient documentation

## 2017-10-14 DIAGNOSIS — Z170 Estrogen receptor positive status [ER+]: Secondary | ICD-10-CM

## 2017-10-14 MED ORDER — DENOSUMAB 60 MG/ML ~~LOC~~ SOSY
PREFILLED_SYRINGE | SUBCUTANEOUS | Status: AC
  Filled 2017-10-14: qty 1

## 2017-10-14 MED ORDER — DENOSUMAB 60 MG/ML ~~LOC~~ SOLN
60.0000 mg | Freq: Once | SUBCUTANEOUS | Status: AC
  Administered 2017-10-14: 60 mg via SUBCUTANEOUS

## 2017-10-14 NOTE — Patient Instructions (Signed)

## 2017-10-17 ENCOUNTER — Other Ambulatory Visit: Payer: Self-pay | Admitting: *Deleted

## 2017-10-17 NOTE — Progress Notes (Signed)
Infusion orders are current for patient CBC CMP Tylenol Benadryl appointments are up to date and follow up appointment  is scheduled TB gold not due yet.  

## 2017-10-19 ENCOUNTER — Ambulatory Visit (HOSPITAL_COMMUNITY)
Admission: RE | Admit: 2017-10-19 | Discharge: 2017-10-19 | Disposition: A | Discharge status: Home / Self Care | Payer: Medicare Other | Source: Ambulatory Visit | Attending: Rheumatology | Admitting: Rheumatology

## 2017-10-19 DIAGNOSIS — M0579 Rheumatoid arthritis with rheumatoid factor of multiple sites without organ or systems involvement: Secondary | ICD-10-CM | POA: Diagnosis not present

## 2017-10-19 MED ORDER — ACETAMINOPHEN 325 MG PO TABS: 650.0000 mg | ORAL_TABLET | ORAL | Status: DC

## 2017-10-19 MED ORDER — SODIUM CHLORIDE 0.9 % IV SOLN
750.0000 mg | INTRAVENOUS | Status: AC
  Administered 2017-10-19: 750 mg via INTRAVENOUS
  Filled 2017-10-19: qty 30

## 2017-10-19 MED ORDER — DIPHENHYDRAMINE HCL 25 MG PO CAPS
25.0000 mg | ORAL_CAPSULE | ORAL | Status: AC
  Administered 2017-10-19: 25 mg via ORAL

## 2017-10-19 MED ORDER — DIPHENHYDRAMINE HCL 25 MG PO CAPS
ORAL_CAPSULE | ORAL | Status: AC
  Filled 2017-10-19: qty 1

## 2017-10-25 ENCOUNTER — Telehealth: Payer: Self-pay

## 2017-10-25 NOTE — Telephone Encounter (Signed)
Patient called stating that her co-pay for Orencia IV was too expensive and she would not be able to afford it any longer. She is interested in using the Orencia SQ. She feels that it would be better because she would not have to wait two hours. She had a bad experience during her last infusion that created injection site bruises on her skin.  Called pts insurance [facility] Medicare Complete) to verify benefits. Spoke with Twinkle who states that no pre-cetification is required. She has an active plan since 05/31/2017.    Reference number: [id number]/19  Spoke with representative from benefits department who states that the pts aarp medicare complete plan is her primary plan. She does not show any record of active Medicaid. Pt is responsible for 20% coninsurance for the cost of the medication and a $275 copay for administration fee.   Reference number: [id number]  NIDPOE UMP NTIRWER Manufacture assistance to check eligibility requirements. Spoke with representative who states that they provide assistance for pts with medicare IV and SQ. Pt's household is required to spend 3% of annual income for everyone in household. Pt must fill out application and submit financial documents (SS Benefit letter, 1099 or W2s) If pt is approved for assistance for Orencia SQ, medication will be provided free of charge. If pt is approved for Orencia IV, pt is responsible for administration fee. Representative will fax application.   Called pt to update. She would like to apply for Orencia SQ and apply for manufacture assistance if she can afford the co-pay. Can patient switch from [place] IV to [place] for cost savings purposes?   Thanks!  Jaggar Benko, [place], CPhT 1:38 PM

## 2017-10-25 NOTE — Telephone Encounter (Signed)
Patient called. She states that she has to go out of town unexpectedly for her family and would like to have an Rx for prednisone to take with her in case of flares. She will be leaving on Thursday, May 30th and returning on Sunday, June 2nd.   Please advise. Thanks!  Knowledge Escandon, [place], CPhT 1:48 PM

## 2017-10-26 ENCOUNTER — Other Ambulatory Visit: Payer: Self-pay | Admitting: Rheumatology

## 2017-10-26 MED ORDER — PREDNISONE 5 MG PO TABS: ORAL_TABLET | ORAL | 0 refills | Status: DC

## 2017-10-26 NOTE — Telephone Encounter (Signed)
Okay to give a prednisone taper starting at 20 mg and taper by 5 mg every 2 days.

## 2017-10-26 NOTE — Telephone Encounter (Signed)
Called patient and patient states she is flaring in her arm and this started several days ago. I advised patient we would send in prednisone taper per Dr. Estanislado Pandy and patient verbalized understanding.

## 2017-10-26 NOTE — Progress Notes (Deleted)
Office Visit Note  Patient: Felicia Gonzalez             Date of Birth: 09-29-1940           MRN: [id number]             PCP: Vernie Shanks, MD Referring: Vernie Shanks, MD Visit Date: 11/07/2017 Occupation: @GUAROCC @    Subjective:  No chief complaint on file.   History of Present Illness: Felicia Gonzalez is a 77 y.o. female ***   Activities of Daily Living:  Patient reports morning stiffness for *** {minute/hour:19697}.   Patient {ACTIONS;DENIES/REPORTS:21021675::"Denies"} nocturnal pain.  Difficulty dressing/grooming: {ACTIONS;DENIES/REPORTS:21021675::"Denies"} Difficulty climbing stairs: {ACTIONS;DENIES/REPORTS:21021675::"Denies"} Difficulty getting out of chair: {ACTIONS;DENIES/REPORTS:21021675::"Denies"} Difficulty using hands for taps, buttons, cutlery, and/or writing: {ACTIONS;DENIES/REPORTS:21021675::"Denies"}   No Rheumatology ROS completed.   PMFS History:  Patient Active Problem List   Diagnosis Date Noted  . Malignant neoplasm of lower-inner quadrant of left breast in female, estrogen receptor positive ([place]) 10/04/2016  . Osteoporosis 10/04/2016  . Malignant neoplasm of upper-inner quadrant of left breast in female, estrogen receptor positive ([place]) 08/26/2016  . High risk medication use 06/08/2016  . Age-related osteoporosis without current pathological fracture 06/08/2016  . ANA positive 06/08/2016  . Moderate persistent asthma 06/08/2016  . Normocytic anemia 10/01/2014  . Rheumatoid arthritis of multiple sites without organ or system involvement with positive rheumatoid factor ([place]) 10/01/2014  . Subcapital fracture of left hip ([place]) 10/01/2014    Past Medical History:  Diagnosis Date  . Anemia   . Arthritis    rheumatoid +CCP neg RF +ANA synovitis   . Asthma   . Cancer [facility])    Breast cancer  . Glaucoma    right eye  . Osteoporosis   . Personal history of radiation therapy 09/2016    Family History  Problem Relation Age of Onset  . Hypertension  Father    Past Surgical History:  Procedure Laterality Date  . BREAST BIOPSY Left 05/14/2016   x2  . BREAST BIOPSY Right 05/14/2016  . BREAST LUMPECTOMY Left 07/09/2016  . BREAST LUMPECTOMY WITH RADIOACTIVE SEED AND SENTINEL LYMPH NODE BIOPSY Left 07/09/2016   Procedure: BREAST LUMPECTOMY WITH RADIOACTIVE SEED AND SENTINEL LYMPH NODE BIOPSY;  Surgeon: Coralie Keens, MD;  Location: [place];  Service: General;  Laterality: Left;  . NO PAST SURGERIES    . TOTAL HIP ARTHROPLASTY Left 10/01/2014   Procedure: TOTAL HIP ARTHROPLASTY;  Surgeon: Garald Balding, MD;  Location: [place];  Service: Orthopedics;  Laterality: Left;   Social History   Social History Narrative  . Not on file     Objective: Vital Signs: There were no vitals taken for this visit.   Physical Exam   Musculoskeletal Exam: ***  CDAI Exam: No CDAI exam completed.    Investigation: No additional findings. CBC Latest Ref Rng & Units 09/21/2017 07/18/2017 04/18/2017  WBC 4.0 - 10.5 K/uL 4.6 5.7 7.1  Hemoglobin 12.0 - 15.0 g/dL 10.3(L) 10.6(L) 9.9(L)  Hematocrit 36.0 - 46.0 % 32.1(L) 32.8(L) 31.5(L)  Platelets 150 - 400 K/uL 277 265 275   CMP Latest Ref Rng & Units 09/21/2017 07/18/2017 04/18/2017  Glucose 65 - 99 mg/dL 88 117 103  BUN 6 - 20 mg/dL 9 10 11.1  Creatinine 0.44 - 1.00 mg/dL 0.66 0.80 0.7  Sodium 135 - 145 mmol/L 133(L) 137 137  Potassium 3.5 - 5.1 mmol/L 3.7 3.6 3.7  Chloride 101 - 111 mmol/L 99(L) 99 -  CO2 22 -  32 mmol/L 26 27 29   Calcium 8.9 - 10.3 mg/dL 8.7(L) 9.6 9.2  Total Protein 6.5 - 8.1 g/dL 6.3(L) 6.7 6.9  Total Bilirubin 0.3 - 1.2 mg/dL 0.5 0.3 0.27  Alkaline Phos 38 - 126 U/L 69 87 77  AST 15 - 41 U/L 25 25 26   ALT 14 - 54 U/L 22 29 30     Imaging: No results found.  Speciality Comments: Orencia IV every 4 weeks TB gold negative 03/10/16 (ordered with next infusion in October 2018)    Procedures:  No procedures performed Allergies: Penicillins and Sulfa antibiotics    Assessment / Plan:     Visit Diagnoses: No diagnosis found.    Orders: No orders of the defined types were placed in this encounter.  No orders of the defined types were placed in this encounter.   Face-to-face time spent with patient was *** minutes. 50% of time was spent in counseling and coordination of care.  Follow-Up Instructions: No follow-ups on file.   Earnestine Mealing, CMA  Note - This record has been created using Editor, commissioning.  Chart creation errors have been sought, but may not always  have been located. Such creation errors do not reflect on  the standard of medical care.

## 2017-10-26 NOTE — Telephone Encounter (Signed)
Okay to send in a prednisone taper?

## 2017-10-27 ENCOUNTER — Telehealth: Payer: Self-pay

## 2017-10-27 NOTE — Telephone Encounter (Signed)
A prior authorization for Felicia Gonzalez has been submitted to pts insurance via cover my meds. Will update once we have a response.   [name], [place], CPhT 1:16 PM

## 2017-10-27 NOTE — Telephone Encounter (Signed)
Received a fax from [employer] regarding a prior authorization approval for ORENCIA CLICKJET through 95/28/4132.   Reference number: [id number]  Phone number: [phone]  Will send document to scan center.  Called pt to update. Pt did not answer, had to leave a message.   After running a test claim, pts Rx comes out to be $8.50. We can send her Rx to [place], have the first dose shipped to clinic. We will schedule a nursing visit once the medication has been delivered. If pt can't afford co-pay we can apply for manufacture assistance. Will try to reach pt at a later time.   Indy Prestwood, [employer], CPhT 1:19 PM

## 2017-10-27 NOTE — Telephone Encounter (Signed)
Switch to Orencia subcu

## 2017-10-31 NOTE — Telephone Encounter (Signed)
Called pt a second time to give update on Orencia SQ approval. Had to leave a message.   Bernestine Holsapple, [place], CPhT 12:47 PM

## 2017-11-02 ENCOUNTER — Telehealth: Payer: Self-pay | Admitting: Rheumatology

## 2017-11-02 NOTE — Telephone Encounter (Signed)
Patient requesting a call concerning current Prednisone rx. Patient will be out after today, and wants to know if she needs to continue with this, or not. Please call to advise.

## 2017-11-02 NOTE — Telephone Encounter (Signed)
Patient returning Chasta's call.

## 2017-11-02 NOTE — Telephone Encounter (Signed)
Returned pts call. Prior Authorization has been approved for [employer]. We can fill her Rx at [facility] and have the first refill shipped to the clinic. Once received, we can schedule a nursing visit. Patient did not answer phone, had to leave a message.   Chrles Selley, [place], CPhT 3:57 PM

## 2017-11-03 MED ORDER — ABATACEPT 125 MG/ML ~~LOC~~ SOAJ: 125.0000 mg | SUBCUTANEOUS | 0 refills | Status: DC

## 2017-11-03 MED FILL — ORENCIA 125 MG/ML SYRINGE: 125 | 28 days supply | Qty: 4 | Fill #0

## 2017-11-03 NOTE — Telephone Encounter (Signed)
Attempted to contact patient and unable to leave a message.

## 2017-11-03 NOTE — Addendum Note (Signed)
Addended by: Carole Binning on: 11/03/2017 10:13 AM   Modules accepted: Orders

## 2017-11-03 NOTE — Telephone Encounter (Signed)
Patient has completed her prednisone taper. Patient is feeling better. Patient advised she does not need to continue the Prednisone as she has completed the taper.

## 2017-11-03 NOTE — Telephone Encounter (Signed)
Coordinated delivery of Orencia Clickjet from [facility]. Meds placed in fridge. Please schedule nursing visit. Thanks!  Maxamillion Banas, [place], CPhT 11:57 AM

## 2017-11-03 NOTE — Telephone Encounter (Signed)
Spoke with pt. She would like for her Rx for Orencia SQ to be sent to [facility]). We will have first refill shipped to clinic and schedule nursing visit once received. Patient voices understanding.   Her next infusion for Orencia IV is scheduled for 6/19. She will cancel that appointment. She finished her prednisone taper and feels like her flares are controlled. She would like to know if she should do anything else? Please call pt at [phone]. Please send Rx for Orencia SQ to [facility] pharmacy.   Tatym Schermer, [place], CPhT 10:15 AM

## 2017-11-03 NOTE — Telephone Encounter (Signed)
See previous phone note.  

## 2017-11-04 NOTE — Telephone Encounter (Signed)
Patient advised medication is in office.Patient will call the office when she returns to town to schedule her nurse visit.

## 2017-11-05 ENCOUNTER — Other Ambulatory Visit: Payer: Self-pay | Admitting: Rheumatology

## 2017-11-07 ENCOUNTER — Ambulatory Visit: Payer: Medicare Other | Admitting: Rheumatology

## 2017-11-07 ENCOUNTER — Telehealth: Payer: Self-pay | Admitting: Rheumatology

## 2017-11-07 NOTE — Telephone Encounter (Signed)
Patient call requesting prescription refill of [facility] [address] in [place].  Patient also stated that she received her Orencia and would like to schedule a nurse visit on 6/19.

## 2017-11-07 NOTE — Telephone Encounter (Signed)
Patient's prescription for Felicia Gonzalez has already been sent to the pharmacy. Patient has been scheduled for appointment o start the [place] on 11/16/17 at 10 am.

## 2017-11-07 NOTE — Telephone Encounter (Signed)
Last visit: 06/15/17 Next Visit: 12/05/17 Labs: 09/21/17 She should take calcium 1200 mg a day. Rest of labs are stable  Okay to refill per Dr. Estanislado Pandy

## 2017-11-16 ENCOUNTER — Ambulatory Visit (INDEPENDENT_AMBULATORY_CARE_PROVIDER_SITE_OTHER): Payer: Medicare Other | Admitting: *Deleted

## 2017-11-16 ENCOUNTER — Encounter (HOSPITAL_COMMUNITY): Payer: Medicare Other

## 2017-11-16 VITALS — BP 159/95 | HR 82

## 2017-11-16 DIAGNOSIS — M0579 Rheumatoid arthritis with rheumatoid factor of multiple sites without organ or systems involvement: Secondary | ICD-10-CM

## 2017-11-16 MED ORDER — ABATACEPT 125 MG/ML ~~LOC~~ SOSY
125.0000 mg | PREFILLED_SYRINGE | Freq: Once | SUBCUTANEOUS | Status: AC
Start: 2017-11-16 — End: 2017-11-16
  Administered 2017-11-16: 125 mg via SUBCUTANEOUS

## 2017-11-16 NOTE — Patient Instructions (Signed)
Standing Labs We placed an order today for your standing lab work.    Please come back and get your standing labs in 1 month and every 3 months  We have open lab Monday through Friday from 8:30-11:30 AM and 1:30-4:00 PM  at the office of Dr. Shaili Deveshwar.   You may experience shorter wait times on Monday and Friday afternoons. The office is located at [address], [address], [place], [place] [address] No appointment is necessary.   Labs are drawn by [facility].  You may receive a bill from [facility] for your lab work. If you have any questions regarding directions or hours of operation,  please call [phone].     

## 2017-11-16 NOTE — Progress Notes (Signed)
Patient in office for a new start to [place]. Patient was given a demonstration to self inject medication. Patient was able to show proper technique to self inject medication. Patient was given injection on her left lower abdomen and tolerated well. Patient was monitored in office for 30 minutes after injection was administered for adverse reactions. No adverse reactions noted.   Administrations This Visit    Abatacept SOSY 125 mg    Admin Date 11/16/2017 Action Given Dose 125 mg Route Subcutaneous Administered By Carole Binning, LPN

## 2017-11-21 NOTE — Progress Notes (Signed)
Office Visit Note  Patient: Felicia Gonzalez             Date of Birth: 1940/12/21           MRN: [id number]             PCP: Vernie Shanks, MD Referring: Vernie Shanks, MD Visit Date: 12/05/2017 Occupation: '@GUAROCC'$ @    Subjective:  Joint pain and swelling.   History of Present Illness: Felicia Gonzalez is a 77 y.o. female with history of seropositive rheumatoid arthritis.  She had a flare when she was off Orencia.  She was switched from IV Orencia to subcu Orencia due to insurance reasons.  She states she has been having off-and-on discomfort and swelling in her joints.  She has some pain and swelling in the right knee a week ago.  Now she is having discomfort and swelling in her right wrist and left fifth PIP joint.  Activities of Daily Living:  Patient reports morning stiffness for 30 minutes.   Patient Denies nocturnal pain.  Difficulty dressing/grooming: Denies Difficulty climbing stairs: Reports Difficulty getting out of chair: Reports Difficulty using hands for taps, buttons, cutlery, and/or writing: Reports   No Rheumatology ROS completed.   PMFS History:  Patient Active Problem List   Diagnosis Date Noted  . Malignant neoplasm of lower-inner quadrant of left breast in female, estrogen receptor positive ([place]) 10/04/2016  . Osteoporosis 10/04/2016  . Malignant neoplasm of upper-inner quadrant of left breast in female, estrogen receptor positive ([place]) 08/26/2016  . High risk medication use 06/08/2016  . Age-related osteoporosis without current pathological fracture 06/08/2016  . ANA positive 06/08/2016  . Moderate persistent asthma 06/08/2016  . Normocytic anemia 10/01/2014  . Rheumatoid arthritis of multiple sites without organ or system involvement with positive rheumatoid factor ([place]) 10/01/2014  . Subcapital fracture of left hip ([place]) 10/01/2014    Past Medical History:  Diagnosis Date  . Anemia   . Arthritis    rheumatoid +CCP neg RF +ANA synovitis   . Asthma    . Cancer [facility])    Breast cancer  . Glaucoma    right eye  . Osteoporosis   . Personal history of radiation therapy 09/2016    Family History  Problem Relation Age of Onset  . Hypertension Father    Past Surgical History:  Procedure Laterality Date  . BREAST BIOPSY Left 05/14/2016   x2  . BREAST BIOPSY Right 05/14/2016  . BREAST LUMPECTOMY Left 07/09/2016  . BREAST LUMPECTOMY WITH RADIOACTIVE SEED AND SENTINEL LYMPH NODE BIOPSY Left 07/09/2016   Procedure: BREAST LUMPECTOMY WITH RADIOACTIVE SEED AND SENTINEL LYMPH NODE BIOPSY;  Surgeon: Coralie Keens, MD;  Location: [place];  Service: General;  Laterality: Left;  . NO PAST SURGERIES    . TOTAL HIP ARTHROPLASTY Left 10/01/2014   Procedure: TOTAL HIP ARTHROPLASTY;  Surgeon: Garald Balding, MD;  Location: [place];  Service: Orthopedics;  Laterality: Left;   Social History   Social History Narrative  . Not on file     Objective: Vital Signs: BP 133/83 (BP Location: Left Arm, Patient Position: Sitting, Cuff Size: Normal)   Pulse 96   Resp 12   Ht '5\' 1"'$  (1.549 m)   Wt 142 lb (64.4 kg)   BMI 26.83 kg/m    Physical Exam  Constitutional: She is oriented to person, place, and time. She appears well-developed and well-nourished.  HENT:  Head: Normocephalic and atraumatic.  Eyes: Conjunctivae and EOM are normal.  Neck: Normal range of motion.  Cardiovascular: Normal rate, regular rhythm, normal heart sounds and intact distal pulses.  Pulmonary/Chest: Effort normal and breath sounds normal.  Abdominal: Soft. Bowel sounds are normal.  Lymphadenopathy:    She has no cervical adenopathy.  Neurological: She is alert and oriented to person, place, and time.  Skin: Skin is warm and dry. Capillary refill takes less than 2 seconds.  Psychiatric: She has a normal mood and affect. Her behavior is normal.  Nursing note and vitals reviewed.    Musculoskeletal Exam: C-spine thoracic lumbar spine good range of motion.  She has some  thoracic kyphosis.  Shoulder joints, elbow joints,  were in good range of motion.  She has synovitis and limited range of motion of right wrist joint.  She has some synovitis over left fifth PIP joint.  MCP thickening was noted but no synovitis was noted.  Hip joints, knee joints, ankles, MTPs and PIPs were in good range of motion with no synovitis.  She has some discomfort range of motion of her right knee joint.  CDAI Exam: CDAI Homunculus Exam:   Tenderness:  RUE: glenohumeral and wrist Left hand: 5th PIP RLE: tibiofemoral  Swelling:  RUE: wrist Left hand: 5th PIP RLE: tibiofemoral  Joint Counts:  CDAI Tender Joint count: 4 CDAI Swollen Joint count: 3  Global Assessments:  Patient Global Assessment: 6 Provider Global Assessment: 6  CDAI Calculated Score: 19    Investigation: No additional findings.TB Gold: 03/14/2017 Negative  CBC Latest Ref Rng & Units 09/21/2017 07/18/2017 04/18/2017  WBC 4.0 - 10.5 K/uL 4.6 5.7 7.1  Hemoglobin 12.0 - 15.0 g/dL 10.3(L) 10.6(L) 9.9(L)  Hematocrit 36.0 - 46.0 % 32.1(L) 32.8(L) 31.5(L)  Platelets 150 - 400 K/uL 277 265 275   CMP Latest Ref Rng & Units 09/21/2017 07/18/2017 04/18/2017  Glucose 65 - 99 mg/dL 88 117 103  BUN 6 - 20 mg/dL 9 10 11.1  Creatinine 0.44 - 1.00 mg/dL 0.66 0.80 0.7  Sodium 135 - 145 mmol/L 133(L) 137 137  Potassium 3.5 - 5.1 mmol/L 3.7 3.6 3.7  Chloride 101 - 111 mmol/L 99(L) 99 -  CO2 22 - 32 mmol/L '26 27 29  '$ Calcium 8.9 - 10.3 mg/dL 8.7(L) 9.6 9.2  Total Protein 6.5 - 8.1 g/dL 6.3(L) 6.7 6.9  Total Bilirubin 0.3 - 1.2 mg/dL 0.5 0.3 0.27  Alkaline Phos 38 - 126 U/L 69 87 77  AST 15 - 41 U/L '25 25 26  '$ ALT 14 - 54 U/L '22 29 30    '$ Imaging: No results found.  Speciality Comments: Orencia IV every 4 weeks    Procedures:  No procedures performed Allergies: Penicillins and Sulfa antibiotics   Assessment / Plan:     Visit Diagnoses: Rheumatoid arthritis of multiple sites without organ or system involvement  with positive rheumatoid factor (HCC) - Positive RF, positive anti-CCP, positive ANA, elevated ESR.  Patient is having a mild flare with increased pain and inflammation in few joints as described above.  She did not quite well on IV Orencia in the past but we had to switch to subcu due to insurance coverage.  As she is having a flare of advised her to take prednisone 5 mg p.o. daily for 4 days and then decrease it to 2.5 mg p.o. daily for 4 days.  She will discontinue prednisone after that.  High risk medication use - Orencia subcu weekly, Arava 20 mg by mouth daily - Plan: CBC with Differential/Platelet, COMPLETE METABOLIC PANEL  WITH GFR today and then every 3 months.  Pain in both hands-she has synovitis in her hands.  Chronic pain of right knee-she had recent flare in her right knee joint but no warmth swelling or effusion was noted today.  ANA positive  Age-related osteoporosis without current pathological fracture -  on Evista and Prolia subcutaneous now by her oncologist.  Malignant neoplasm of upper-inner quadrant of left breast in female, estrogen receptor positive ([place])  History of anemia  History of asthma    Orders: Orders Placed This Encounter  Procedures  . CBC with Differential/Platelet  . COMPLETE METABOLIC PANEL WITH GFR   No orders of the defined types were placed in this encounter.   Face-to-face time spent with patient was 30 minutes. Greater than 50% of time was spent in counseling and coordination of care.  Follow-Up Instructions: Return in about 3 months (around 03/07/2018) for Rheumatoid arthritis, Osteoporosis.   Bo Merino, MD  Note - This record has been created using Editor, commissioning.  Chart creation errors have been sought, but may not always  have been located. Such creation errors do not reflect on  the standard of medical care.

## 2017-11-23 ENCOUNTER — Ambulatory Visit (INDEPENDENT_AMBULATORY_CARE_PROVIDER_SITE_OTHER): Payer: Medicare Other | Admitting: *Deleted

## 2017-11-23 DIAGNOSIS — M0579 Rheumatoid arthritis with rheumatoid factor of multiple sites without organ or systems involvement: Secondary | ICD-10-CM

## 2017-11-23 MED ORDER — ABATACEPT 125 MG/ML ~~LOC~~ SOSY
125.0000 mg | PREFILLED_SYRINGE | Freq: Once | SUBCUTANEOUS | Status: AC
  Administered 2017-11-23: 125 mg via SUBCUTANEOUS

## 2017-11-23 NOTE — Progress Notes (Signed)
Patient in office today for an Orencia injection. Patient came into the office today to demonstrate she was able to give Orencia injection to herself. Patient gave injection to herself in left thigh and tolerated well.   Administrations This Visit    Abatacept SOSY 125 mg    Admin Date 11/23/2017 Action Given Dose 125 mg Route Subcutaneous Administered By Carole Binning, LPN

## 2017-12-05 ENCOUNTER — Encounter: Payer: Self-pay | Admitting: Rheumatology

## 2017-12-05 ENCOUNTER — Ambulatory Visit: Payer: Medicare Other | Admitting: Rheumatology

## 2017-12-05 VITALS — BP 133/83 | HR 96 | Resp 12 | Ht 61.0 in | Wt 142.0 lb

## 2017-12-05 DIAGNOSIS — R768 Other specified abnormal immunological findings in serum: Secondary | ICD-10-CM

## 2017-12-05 DIAGNOSIS — G8929 Other chronic pain: Secondary | ICD-10-CM

## 2017-12-05 DIAGNOSIS — Z79899 Other long term (current) drug therapy: Secondary | ICD-10-CM | POA: Diagnosis not present

## 2017-12-05 DIAGNOSIS — M810 Age-related osteoporosis without current pathological fracture: Secondary | ICD-10-CM | POA: Diagnosis not present

## 2017-12-05 DIAGNOSIS — M0579 Rheumatoid arthritis with rheumatoid factor of multiple sites without organ or systems involvement: Secondary | ICD-10-CM | POA: Diagnosis not present

## 2017-12-05 DIAGNOSIS — M79642 Pain in left hand: Secondary | ICD-10-CM | POA: Diagnosis not present

## 2017-12-05 DIAGNOSIS — M25561 Pain in right knee: Secondary | ICD-10-CM | POA: Diagnosis not present

## 2017-12-05 DIAGNOSIS — C50212 Malignant neoplasm of upper-inner quadrant of left female breast: Secondary | ICD-10-CM | POA: Diagnosis not present

## 2017-12-05 DIAGNOSIS — Z862 Personal history of diseases of the blood and blood-forming organs and certain disorders involving the immune mechanism: Secondary | ICD-10-CM

## 2017-12-05 DIAGNOSIS — Z170 Estrogen receptor positive status [ER+]: Secondary | ICD-10-CM

## 2017-12-05 DIAGNOSIS — Z8709 Personal history of other diseases of the respiratory system: Secondary | ICD-10-CM

## 2017-12-05 DIAGNOSIS — M79641 Pain in right hand: Secondary | ICD-10-CM | POA: Diagnosis not present

## 2017-12-05 LAB — CBC WITH DIFFERENTIAL/PLATELET
Basophils Absolute: 89 {cells}/uL (ref 0–200)
Basophils Relative: 1.5 %
Eosinophils Absolute: 224 {cells}/uL (ref 15–500)
Eosinophils Relative: 3.8 %
HCT: 34.5 % — ABNORMAL LOW (ref 35.0–45.0)
Hemoglobin: 11.1 g/dL — ABNORMAL LOW (ref 11.7–15.5)
Lymphs Abs: 1322 {cells}/uL (ref 850–3900)
MCH: 28.2 pg (ref 27.0–33.0)
MCHC: 32.2 g/dL (ref 32.0–36.0)
MCV: 87.8 fL (ref 80.0–100.0)
MPV: 9.8 fL (ref 7.5–12.5)
Monocytes Relative: 13.8 %
Neutro Abs: 3452 {cells}/uL (ref 1500–7800)
Neutrophils Relative %: 58.5 %
Platelets: 394 Thousand/uL (ref 140–400)
RBC: 3.93 Million/uL (ref 3.80–5.10)
RDW: 14.4 % (ref 11.0–15.0)
Total Lymphocyte: 22.4 %
WBC mixed population: 814 {cells}/uL (ref 200–950)
WBC: 5.9 Thousand/uL (ref 3.8–10.8)

## 2017-12-05 LAB — COMPLETE METABOLIC PANEL WITHOUT GFR
AG Ratio: 1.5 (calc) (ref 1.0–2.5)
ALT: 22 U/L (ref 6–29)
AST: 22 U/L (ref 10–35)
Albumin: 4.1 g/dL (ref 3.6–5.1)
Alkaline phosphatase (APISO): 111 U/L (ref 33–130)
BUN: 11 mg/dL (ref 7–25)
CO2: 30 mmol/L (ref 20–32)
Calcium: 9.6 mg/dL (ref 8.6–10.4)
Chloride: 100 mmol/L (ref 98–110)
Creat: 0.69 mg/dL (ref 0.60–0.93)
GFR, Est African American: 99 mL/min/1.73m2
GFR, Est Non African American: 85 mL/min/1.73m2
Globulin: 2.7 g/dL (ref 1.9–3.7)
Glucose, Bld: 96 mg/dL (ref 65–99)
Potassium: 4.6 mmol/L (ref 3.5–5.3)
Sodium: 138 mmol/L (ref 135–146)
Total Bilirubin: 0.3 mg/dL (ref 0.2–1.2)
Total Protein: 6.8 g/dL (ref 6.1–8.1)

## 2017-12-05 NOTE — Patient Instructions (Signed)
Standing Labs We placed an order today for your standing lab work.   Please come back and get your standing labs in October and every 3 months   We have open lab Monday through Friday from 8:30-11:30 AM and 1:30-4:00 PM  at the office of Dr. Serafina Topham.   You may experience shorter wait times on Monday and Friday afternoons. The office is located at [address], [address], [place], [place] [address] No appointment is necessary.   Labs are drawn by [facility].  You may receive a bill from [facility] for your lab work. If you have any questions regarding directions or hours of operation,  please call [phone].    

## 2017-12-08 MED FILL — ORENCIA 125 MG/ML SYRINGE: 125 | 28 days supply | Qty: 4 | Fill #1

## 2018-01-05 MED FILL — ORENCIA 125 MG/ML SYRINGE: 125 | 28 days supply | Qty: 4 | Fill #2

## 2018-01-10 ENCOUNTER — Telehealth: Payer: Self-pay | Admitting: Rheumatology

## 2018-01-10 ENCOUNTER — Other Ambulatory Visit: Payer: Self-pay

## 2018-01-10 MED ORDER — PREDNISONE 5 MG PO TABS: ORAL_TABLET | ORAL | 0 refills | Status: DC

## 2018-01-10 MED ORDER — RALOXIFENE HCL 60 MG PO TABS: 60.0000 mg | ORAL_TABLET | Freq: Every day | ORAL | 1 refills | Status: DC

## 2018-01-10 MED ORDER — LEFLUNOMIDE 20 MG PO TABS: 20.0000 mg | ORAL_TABLET | Freq: Every day | ORAL | 0 refills | Status: DC

## 2018-01-10 NOTE — Telephone Encounter (Signed)
Patient states she will be traveling out of the country. Patient states she is experiencing hand pain and is nervous about traveling that far without prednisone. Okay to send in a prednisone taper?

## 2018-01-10 NOTE — Telephone Encounter (Signed)
Last visit: 12/05/2017 Next visit: 03/06/2018 Labs: 12/05/2017 Anemia is improving. We will continue to monitor. CMP WNL.  Okay to refill Arava, per Dr. Estanislado Pandy.

## 2018-01-10 NOTE — Telephone Encounter (Signed)
Patient called requesting prescription refill of Arava and [facility] [address]  Patient states she needs the Prednisone because she is traveling out of the country for 4-5 days and doesn't want to get inflammation in her hands while she is away.

## 2018-01-10 NOTE — Telephone Encounter (Signed)
Yes, Starting Prednisone 20 mg and taper by 5 mg every 4 days.

## 2018-01-31 ENCOUNTER — Other Ambulatory Visit: Payer: Self-pay | Admitting: Rheumatology

## 2018-01-31 NOTE — Telephone Encounter (Signed)
Last Visit: 12/05/17 Next Visit: 03/06/18 Labs: 12/05/17 Anemia is improving. We will continue to monitor. CMP WNL. TB Gold: 03/14/17 Neg   Okay to refill per Dr. Estanislado Pandy

## 2018-02-02 MED FILL — ORENCIA 125 MG/ML SYRINGE: 125 | 28 days supply | Qty: 4 | Fill #0

## 2018-02-03 ENCOUNTER — Other Ambulatory Visit: Payer: Self-pay | Admitting: Hematology

## 2018-02-05 ENCOUNTER — Other Ambulatory Visit: Payer: Self-pay | Admitting: Rheumatology

## 2018-02-06 NOTE — Telephone Encounter (Signed)
Last Visit: 12/05/17 Next Visit: 03/06/18 Labs: 12/05/17 Anemia is improving. We will continue to monitor. CMP WNL  Okay to refill per Dr. Estanislado Pandy

## 2018-02-20 NOTE — Progress Notes (Signed)
Office Visit Note  Patient: Felicia Gonzalez             Date of Birth: 04/20/1941           MRN: [id number]             PCP: Vernie Shanks, MD Referring: Vernie Shanks, MD Visit Date: 03/06/2018 Occupation: _0 @  Subjective:  Increased joint pain.   History of Present Illness: Felicia Gonzalez is a 77 y.o. female with history of seropositive rheumatoid arthritis.  She states she has had some increased increase activity recently.  She has been experiencing increased pain and discomfort in her bilateral hands and wrists.  She has been using brace which has not been helping much.  She denies pain or discomfort in any other joints.  She has been taking her medications on a regular basis.  Activities of Daily Living:  Patient reports morning stiffness for 0 minute.   Patient Reports nocturnal pain.  Difficulty dressing/grooming: Denies Difficulty climbing stairs: Denies Difficulty getting out of chair: Denies Difficulty using hands for taps, buttons, cutlery, and/or writing: Reports  Review of Systems  Constitutional: Positive for fatigue. Negative for night sweats, weight gain and weight loss.  HENT: Negative for mouth sores, trouble swallowing, trouble swallowing, mouth dryness and nose dryness.   Eyes: Negative for pain, redness, visual disturbance and dryness.  Respiratory: Negative for cough, shortness of breath and difficulty breathing.   Cardiovascular: Negative for chest pain, palpitations, hypertension, irregular heartbeat and swelling in legs/feet.  Gastrointestinal: Negative for blood in stool, constipation and diarrhea.  Endocrine: Negative for increased urination.  Genitourinary: Negative for vaginal dryness.  Musculoskeletal: Positive for arthralgias and joint pain. Negative for joint swelling, myalgias, muscle weakness, morning stiffness, muscle tenderness and myalgias.  Skin: Negative for color change, rash, hair loss, skin tightness, ulcers and sensitivity to  sunlight.  Allergic/Immunologic: Negative for susceptible to infections.  Neurological: Negative for dizziness, memory loss, night sweats and weakness.  Hematological: Negative for swollen glands.  Psychiatric/Behavioral: Positive for sleep disturbance. Negative for depressed mood. The patient is not nervous/anxious.     PMFS History:  Patient Active Problem List   Diagnosis Date Noted  . Malignant neoplasm of lower-inner quadrant of left breast in female, estrogen receptor positive ([place]) 10/04/2016  . Osteoporosis 10/04/2016  . Malignant neoplasm of upper-inner quadrant of left breast in female, estrogen receptor positive ([place]) 08/26/2016  . High risk medication use 06/08/2016  . Age-related osteoporosis without current pathological fracture 06/08/2016  . ANA positive 06/08/2016  . Moderate persistent asthma 06/08/2016  . Normocytic anemia 10/01/2014  . Rheumatoid arthritis of multiple sites without organ or system involvement with positive rheumatoid factor ([place]) 10/01/2014  . Subcapital fracture of left hip ([place]) 10/01/2014    Past Medical History:  Diagnosis Date  . Anemia   . Arthritis    rheumatoid +CCP neg RF +ANA synovitis   . Asthma   . Cancer [facility])    Breast cancer  . Glaucoma    right eye  . Osteoporosis   . Personal history of radiation therapy 09/2016    Family History  Problem Relation Age of Onset  . Hypertension Father    Past Surgical History:  Procedure Laterality Date  . BREAST BIOPSY Left 05/14/2016   x2  . BREAST BIOPSY Right 05/14/2016  . BREAST LUMPECTOMY Left 07/09/2016  . BREAST LUMPECTOMY WITH RADIOACTIVE SEED AND SENTINEL LYMPH NODE BIOPSY Left 07/09/2016   Procedure: BREAST LUMPECTOMY WITH RADIOACTIVE  SEED AND SENTINEL LYMPH NODE BIOPSY;  Surgeon: Coralie Keens, MD;  Location: [place];  Service: General;  Laterality: Left;  . NO PAST SURGERIES    . TOTAL HIP ARTHROPLASTY Left 10/01/2014   Procedure: TOTAL HIP ARTHROPLASTY;  Surgeon: Garald Balding, MD;  Location: [place];  Service: Orthopedics;  Laterality: Left;   Social History   Social History Narrative  . Not on file    Objective: Vital Signs: BP 138/84 (BP Location: Left Arm, Patient Position: Sitting, Cuff Size: Normal)   Pulse 92   Resp 14   Ht _0  (1.549 m)   Wt 141 lb (64 kg)   BMI 26.64 kg/m    Physical Exam  Constitutional: She is oriented to person, place, and time. She appears well-developed and well-nourished.  HENT:  Head: Normocephalic and atraumatic.  Eyes: Conjunctivae and EOM are normal.  Neck: Normal range of motion.  Cardiovascular: Normal rate, regular rhythm, normal heart sounds and intact distal pulses.  Pulmonary/Chest: Effort normal and breath sounds normal.  Abdominal: Soft. Bowel sounds are normal.  Lymphadenopathy:    She has no cervical adenopathy.  Neurological: She is alert and oriented to person, place, and time.  Skin: Skin is warm and dry. Capillary refill takes less than 2 seconds.  Psychiatric: She has a normal mood and affect. Her behavior is normal.  Nursing note and vitals reviewed.    Musculoskeletal Exam: On thoracic lumbar spine good range of motion.  Shoulder joints elbow joints were in good range of motion.  She has synovitis and tenderness on palpation of bilateral wrist joints.  She has PIP and DIP thickening with no synovitis.  Hip joints knee joints ankles MTPs PIPs were in good range of motion.  CDAI Exam: CDAI Score: 5  Patient Global Assessment: 6 (mm); Provider Global Assessment: 4 (mm) Swollen: 2 ; Tender: 2  Joint Exam      Right  Left  Wrist  Swollen Tender  Swollen Tender     Investigation: No additional findings.  Imaging: No results found.  Recent Labs: Lab Results  Component Value Date   WBC 5.9 12/05/2017   HGB 11.1 (L) 12/05/2017   PLT 394 12/05/2017   NA 138 12/05/2017   K 4.6 12/05/2017   CL 100 12/05/2017   CO2 30 12/05/2017   GLUCOSE 96 12/05/2017   BUN 11 12/05/2017    CREATININE 0.69 12/05/2017   BILITOT 0.3 12/05/2017   ALKPHOS 69 09/21/2017   AST 22 12/05/2017   ALT 22 12/05/2017   PROT 6.8 12/05/2017   ALBUMIN 3.3 (L) 09/21/2017   CALCIUM 9.6 12/05/2017   GFRAA 99 12/05/2017   QFTBGOLD Negative 03/10/2016   QFTBGOLDPLUS Negative 03/14/2017    Speciality Comments: Orencia IV every 4 weeks  Procedures:  No procedures performed Allergies: Penicillins and Sulfa antibiotics   Assessment / Plan:     Visit Diagnoses: Rheumatoid arthritis of multiple sites without organ or system involvement with positive rheumatoid factor (HCC) - Positive RF, positive anti-CCP, positive ANA, elevated ESR.  She is having a flare with pain and swelling in bilateral wrist joints due to increased activity recently.  None of the joints are swollen.  I will give her a short prednisone taper starting at 20 mg and taper by 5 mg every 2 days.  High risk medication use - Orencia subcu weekly, Arava 20 mg by mouth daily - Plan: CBC with Differential/Platelet, COMPLETE METABOLIC PANEL WITH GFR, QuantiFERON-TB Gold Plus today and then  every 3 months to monitor for drug toxicity.  ANA positive  Age-related osteoporosis without current pathological fracture - on Evista and Prolia subcutaneous now by her oncologist.  Malignant neoplasm of upper-inner quadrant of left breast in female, estrogen receptor positive ([place])  History of asthma  History of anemia   Orders: Orders Placed This Encounter  Procedures  . CBC with Differential/Platelet  . COMPLETE METABOLIC PANEL WITH GFR  . QuantiFERON-TB Gold Plus   Meds ordered this encounter  Medications  . predniSONE (DELTASONE) 5 MG tablet    Sig: Take 4 tabs by mouth x2 days, 3 tabs x2 days, 2 tabs x2 days, 1 tab x2 days.    Dispense:  20 tablet    Refill:  0     Follow-Up Instructions: Return in about 5 months (around 08/05/2018) for Rheumatoid arthritis.   Bo Merino, MD  Note - This record has been created using  Editor, commissioning.  Chart creation errors have been sought, but may not always  have been located. Such creation errors do not reflect on  the standard of medical care.

## 2018-02-26 ENCOUNTER — Other Ambulatory Visit: Payer: Self-pay | Admitting: Hematology

## 2018-02-28 ENCOUNTER — Telehealth: Payer: Self-pay | Admitting: Hematology

## 2018-02-28 NOTE — Telephone Encounter (Signed)
Scheduled appt per 10/1 sch message - pt is aware of appt date and time.

## 2018-03-01 ENCOUNTER — Other Ambulatory Visit: Payer: Self-pay | Admitting: Pharmacist

## 2018-03-02 MED FILL — ORENCIA 125 MG/ML SYRINGE: 125 | 28 days supply | Qty: 4 | Fill #1

## 2018-03-06 ENCOUNTER — Ambulatory Visit (INDEPENDENT_AMBULATORY_CARE_PROVIDER_SITE_OTHER): Payer: Medicare Other | Admitting: Rheumatology

## 2018-03-06 ENCOUNTER — Encounter: Payer: Self-pay | Admitting: Rheumatology

## 2018-03-06 ENCOUNTER — Other Ambulatory Visit: Payer: Self-pay | Admitting: Rheumatology

## 2018-03-06 VITALS — BP 138/84 | HR 92 | Resp 14 | Ht 61.0 in | Wt 141.0 lb

## 2018-03-06 DIAGNOSIS — Z79899 Other long term (current) drug therapy: Secondary | ICD-10-CM

## 2018-03-06 DIAGNOSIS — R768 Other specified abnormal immunological findings in serum: Secondary | ICD-10-CM | POA: Diagnosis not present

## 2018-03-06 DIAGNOSIS — M0579 Rheumatoid arthritis with rheumatoid factor of multiple sites without organ or systems involvement: Secondary | ICD-10-CM

## 2018-03-06 DIAGNOSIS — M810 Age-related osteoporosis without current pathological fracture: Secondary | ICD-10-CM

## 2018-03-06 DIAGNOSIS — Z862 Personal history of diseases of the blood and blood-forming organs and certain disorders involving the immune mechanism: Secondary | ICD-10-CM

## 2018-03-06 DIAGNOSIS — C50212 Malignant neoplasm of upper-inner quadrant of left female breast: Secondary | ICD-10-CM

## 2018-03-06 DIAGNOSIS — Z170 Estrogen receptor positive status [ER+]: Secondary | ICD-10-CM

## 2018-03-06 DIAGNOSIS — Z8709 Personal history of other diseases of the respiratory system: Secondary | ICD-10-CM

## 2018-03-06 MED ORDER — PREDNISONE 5 MG PO TABS: ORAL_TABLET | ORAL | 0 refills | Status: DC

## 2018-03-06 NOTE — Patient Instructions (Signed)
Standing Labs We placed an order today for your standing lab work.    Please come back and get your standing labs in January and every 3 months   We have open lab Monday through Friday from 8:30-11:30 AM and 1:30-4:00 PM  at the office of Dr. Cambelle Suchecki.   You may experience shorter wait times on Monday and Friday afternoons. The office is located at [address], [address], [place], [place] [address] No appointment is necessary.   Labs are drawn by [facility].  You may receive a bill from [facility] for your lab work. If you have any questions regarding directions or hours of operation,  please call [phone].   Just as a reminder please drink plenty of water prior to coming for your lab work. Thanks!   

## 2018-03-07 NOTE — Progress Notes (Signed)
Patient has chronic anemia.  Please forward the lab results to her PCP.  Please advise her to take multivitamin with iron.

## 2018-03-08 LAB — COMPLETE METABOLIC PANEL WITH GFR
AG Ratio: 1.6 (calc) (ref 1.0–2.5)
ALT: 22 U/L (ref 6–29)
AST: 31 U/L (ref 10–35)
Albumin: 4.1 g/dL (ref 3.6–5.1)
Alkaline phosphatase (APISO): 88 U/L (ref 33–130)
BUN: 10 mg/dL (ref 7–25)
CALCIUM: 9.6 mg/dL (ref 8.6–10.4)
CO2: 28 mmol/L (ref 20–32)
Chloride: 105 mmol/L (ref 98–110)
Creat: 0.66 mg/dL (ref 0.60–0.93)
GFR, EST AFRICAN AMERICAN: 99 mL/min/{1.73_m2} (ref >=60)
GFR, EST NON AFRICAN AMERICAN: 86 mL/min/{1.73_m2} (ref >=60)
GLOBULIN: 2.6 g/dL (ref 1.9–3.7)
Glucose, Bld: 88 mg/dL (ref 65–99)
POTASSIUM: 5.2 mmol/L (ref 3.5–5.3)
Sodium: 143 mmol/L (ref 135–146)
TOTAL PROTEIN: 6.7 g/dL (ref 6.1–8.1)
Total Bilirubin: 0.4 mg/dL (ref 0.2–1.2)

## 2018-03-08 LAB — CBC WITH DIFFERENTIAL/PLATELET
BASOS ABS: 79 {cells}/uL (ref 0–200)
Basophils Relative: 1.3 %
EOS ABS: 250 {cells}/uL (ref 15–500)
Eosinophils Relative: 4.1 %
HEMATOCRIT: 32.5 % — AB (ref 35.0–45.0)
Hemoglobin: 10.5 g/dL — ABNORMAL LOW (ref 11.7–15.5)
Lymphs Abs: 1562 cells/uL (ref 850–3900)
MCH: 27.8 pg (ref 27.0–33.0)
MCHC: 32.3 g/dL (ref 32.0–36.0)
MCV: 86 fL (ref 80.0–100.0)
MONOS PCT: 14 %
MPV: 10 fL (ref 7.5–12.5)
NEUTROS ABS: 3355 {cells}/uL (ref 1500–7800)
Neutrophils Relative %: 55 %
Platelets: 312 10*3/uL (ref 140–400)
RBC: 3.78 10*6/uL — ABNORMAL LOW (ref 3.80–5.10)
RDW: 14.6 % (ref 11.0–15.0)
Total Lymphocyte: 25.6 %
WBC mixed population: 854 cells/uL (ref 200–950)
WBC: 6.1 10*3/uL (ref 3.8–10.8)

## 2018-03-08 LAB — QUANTIFERON-TB GOLD PLUS
MITOGEN-NIL: 8.64 [IU]/mL
NIL: 0.15 [IU]/mL
QuantiFERON-TB Gold Plus: POSITIVE — AB
TB1-NIL: 0.44 IU/mL
TB2-NIL: 0.7 IU/mL

## 2018-03-20 ENCOUNTER — Inpatient Hospital Stay: Payer: Medicare Other

## 2018-03-20 ENCOUNTER — Ambulatory Visit: Payer: Medicare Other

## 2018-03-20 ENCOUNTER — Inpatient Hospital Stay: Payer: Medicare Other | Attending: Hematology | Admitting: Hematology

## 2018-03-20 VITALS — BP 154/73 | HR 100 | Temp 97.7°F | Resp 18 | Ht 61.0 in | Wt 141.0 lb

## 2018-03-20 DIAGNOSIS — Z853 Personal history of malignant neoplasm of breast: Secondary | ICD-10-CM | POA: Diagnosis present

## 2018-03-20 DIAGNOSIS — C50212 Malignant neoplasm of upper-inner quadrant of left female breast: Secondary | ICD-10-CM

## 2018-03-20 DIAGNOSIS — Z170 Estrogen receptor positive status [ER+]: Secondary | ICD-10-CM

## 2018-03-20 DIAGNOSIS — M810 Age-related osteoporosis without current pathological fracture: Secondary | ICD-10-CM

## 2018-03-20 DIAGNOSIS — D509 Iron deficiency anemia, unspecified: Secondary | ICD-10-CM

## 2018-03-20 DIAGNOSIS — D638 Anemia in other chronic diseases classified elsewhere: Secondary | ICD-10-CM | POA: Insufficient documentation

## 2018-03-20 DIAGNOSIS — M069 Rheumatoid arthritis, unspecified: Secondary | ICD-10-CM | POA: Insufficient documentation

## 2018-03-20 DIAGNOSIS — C50312 Malignant neoplasm of lower-inner quadrant of left female breast: Secondary | ICD-10-CM

## 2018-03-20 LAB — CBC WITH DIFFERENTIAL (CANCER CENTER ONLY)
ABS IMMATURE GRANULOCYTES: 0.03 10*3/uL (ref 0.00–0.07)
BASOS ABS: 0.1 10*3/uL (ref 0.0–0.1)
BASOS PCT: 1 %
Eosinophils Absolute: 0.2 10*3/uL (ref 0.0–0.5)
Eosinophils Relative: 3 %
HEMATOCRIT: 35 % — AB (ref 36.0–46.0)
HEMOGLOBIN: 11 g/dL — AB (ref 12.0–15.0)
IMMATURE GRANULOCYTES: 0 %
Lymphocytes Relative: 24 %
Lymphs Abs: 1.8 10*3/uL (ref 0.7–4.0)
MCH: 28 pg (ref 26.0–34.0)
MCHC: 31.4 g/dL (ref 30.0–36.0)
MCV: 89.1 fL (ref 80.0–100.0)
MONO ABS: 0.8 10*3/uL (ref 0.1–1.0)
Monocytes Relative: 10 %
NEUTROS ABS: 4.6 10*3/uL (ref 1.7–7.7)
NEUTROS PCT: 62 %
Platelet Count: 328 10*3/uL (ref 150–400)
RBC: 3.93 MIL/uL (ref 3.87–5.11)
RDW: 16 % — ABNORMAL HIGH (ref 11.5–15.5)
WBC: 7.4 10*3/uL (ref 4.0–10.5)
nRBC: 0 % (ref 0.0–0.2)

## 2018-03-20 LAB — RETICULOCYTES
IMMATURE RETIC FRACT: 9.8 % (ref 2.3–15.9)
RBC.: 3.93 MIL/uL (ref 3.87–5.11)
RETIC COUNT ABSOLUTE: 62.5 10*3/uL (ref 19.0–186.0)
Retic Ct Pct: 1.6 % (ref 0.4–3.1)

## 2018-03-20 LAB — CMP (CANCER CENTER ONLY)
ALBUMIN: 3.5 g/dL (ref 3.5–5.0)
ALT: 28 U/L (ref 0–44)
AST: 28 U/L (ref 15–41)
Alkaline Phosphatase: 86 U/L (ref 38–126)
Anion gap: 8 (ref 5–15)
BUN: 10 mg/dL (ref 8–23)
CHLORIDE: 100 mmol/L (ref 98–111)
CO2: 30 mmol/L (ref 22–32)
Calcium: 9.7 mg/dL (ref 8.9–10.3)
Creatinine: 0.79 mg/dL (ref 0.44–1.00)
GFR, Est AFR Am: >60 mL/min (ref >60)
Glucose, Bld: 130 mg/dL — ABNORMAL HIGH (ref 70–99)
POTASSIUM: 3.8 mmol/L (ref 3.5–5.1)
Sodium: 138 mmol/L (ref 135–145)
TOTAL PROTEIN: 7.2 g/dL (ref 6.5–8.1)
Total Bilirubin: 0.3 mg/dL (ref 0.3–1.2)

## 2018-03-20 LAB — FERRITIN: FERRITIN: 235 ng/mL (ref 11–307)

## 2018-03-20 MED ORDER — DENOSUMAB 60 MG/ML ~~LOC~~ SOLN
60.0000 mg | Freq: Once | SUBCUTANEOUS | Status: AC
  Administered 2018-03-20: 60 mg via SUBCUTANEOUS

## 2018-03-20 MED ORDER — DENOSUMAB 60 MG/ML ~~LOC~~ SOSY
PREFILLED_SYRINGE | SUBCUTANEOUS | Status: AC
  Filled 2018-03-20: qty 1

## 2018-03-20 NOTE — Progress Notes (Signed)
[name]  HEMATOLOGY ONCOLOGY CLINIC NOTE  Date of service: 03/20/18   Patient Care Team: Vernie Shanks, MD as PCP - General (Family Medicine)  Chief Complaint:  F/u for ER/PR positive Her neg Breast cancer  Diagnosis  Stage I (pT1b, pN0, Mx) left sided invasive ductal carcinoma of the breast. 1 cm in size with lymphovascular invasion grade 2/3, Ki-67 15% ER/PR positive HER-2/neu negative. One Sentinel lymph node biopsy negative No DCIS. Severe calcified benign lesions in left breast with PASH (pseudoangiomatous stromal hyperplasia).  Patient is status post left breast lumpectomy by Dr. Ninfa Linden on 07/09/2016.   S/p adjuvant  Left breast RT on 10/05/2016   INTERVAL HISTORY:  Ms Felicia Gonzalez is here for her scheduled follow-up for stage I left breast cancer. The patient's last visit with [place] was on 07/18/17. The pt reports that she is doing well overall.   The pt reports that she has continued with Evista daily. She has also continued follow up with Dr. Estanislado Pandy in rheumatology and is taking Orencia weekly for treatment of her RA. The pt also continues on 5000 units of Vitamin D replacement, and continues on Vitamin B12 replacement as previously recommended.   The pt adds that she has experienced some pain at there previous surgical site at her breast that is tender to the touch. She notes that the pain is not constant and is not always in the same location. She denies noticing any new lumps or bumps.   The pt notes that she has not had any dental concerns in the interim.   Lab results today (03/20/18) of CBC w/diff and CMP is as follows: all values are WNL except for HGB at 11.0, HCT at 35.0, RDW at 16.0, Glucose at 130. 03/20/18 Ferritin is at 235 03/20/18 Vitamin D is pending   On review of systems, pt reports good energy levels, keeping active, occasional pain at previous surgery site, and denies dental issues, noticing any new lumps or bumps, abdominal pains, leg swelling, and any other  symptoms.   REVIEW OF SYSTEMS:    A 10+ POINT REVIEW OF SYSTEMS WAS OBTAINED including neurology, dermatology, psychiatry, cardiac, respiratory, lymph, extremities, GI, GU, Musculoskeletal, constitutional, breasts, reproductive, HEENT.  All pertinent positives are noted in the HPI.  All others are negative. . Past Medical History:  Diagnosis Date  . Anemia   . Arthritis    rheumatoid +CCP neg RF +ANA synovitis   . Asthma   . Cancer [facility])    Breast cancer  . Glaucoma    right eye  . Osteoporosis   . Personal history of radiation therapy 09/2016     . Past Surgical History:  Procedure Laterality Date  . BREAST BIOPSY Left 05/14/2016   x2  . BREAST BIOPSY Right 05/14/2016  . BREAST LUMPECTOMY Left 07/09/2016  . BREAST LUMPECTOMY WITH RADIOACTIVE SEED AND SENTINEL LYMPH NODE BIOPSY Left 07/09/2016   Procedure: BREAST LUMPECTOMY WITH RADIOACTIVE SEED AND SENTINEL LYMPH NODE BIOPSY;  Surgeon: Coralie Keens, MD;  Location: [place];  Service: General;  Laterality: Left;  . NO PAST SURGERIES    . TOTAL HIP ARTHROPLASTY Left 10/01/2014   Procedure: TOTAL HIP ARTHROPLASTY;  Surgeon: Garald Balding, MD;  Location: [place];  Service: Orthopedics;  Laterality: Left;    . Social History   Tobacco Use  . Smoking status: Never Smoker  . Smokeless tobacco: Never Used  Substance Use Topics  . Alcohol use: No  . Drug use: Never    ALLERGIES:  is allergic to penicillins and sulfa antibiotics.  MEDICATIONS:  Current Outpatient Medications  Medication Sig Dispense Refill  . acetaminophen (TYLENOL) 650 MG CR tablet Take 1,300 mg by mouth 2 (two) times daily as needed for pain. Take one hour prior to infusion     . albuterol (PROVENTIL HFA;VENTOLIN HFA) 108 (90 Base) MCG/ACT inhaler Inhale 1-2 puffs into the lungs every 6 (six) hours as needed for wheezing or shortness of breath.    [name] amLODipine (NORVASC) 5 MG tablet Take 5 mg by mouth daily.     [name] aspirin EC 81 MG tablet Take 81 mg by  mouth daily.    . calcium acetate (PHOSLO) 667 MG capsule Take by mouth 3 (three) times daily with meals.    . calcium gluconate 500 MG tablet Take 2 tablets by mouth daily.    . Cholecalciferol (VITAMIN D3) 5000 units TABS Take 5,000 Units by mouth daily after breakfast.    . leflunomide (ARAVA) 20 MG tablet TAKE 1 TABLET BY MOUTH EVERY DAY 90 tablet 0  . Multiple Vitamin (MULTIVITAMIN WITH MINERALS) TABS tablet Take 1 tablet by mouth daily.    . Omega-3 Fatty Acids (OMEGA-3 FISH OIL) 1200 MG CAPS Take 1,200 mg by mouth 2 (two) times daily.    [name] ORENCIA 125 MG/ML SOSY INJECT 125 MG INTO THE SKIN ONCE A WEEK. 12 mL 0  . Polyethyl Glycol-Propyl Glycol (SYSTANE OP) Place 1 drop into the left eye at bedtime.    . predniSONE (DELTASONE) 5 MG tablet Take 4 tabs by mouth x2 days, 3 tabs x2 days, 2 tabs x2 days, 1 tab x2 days. 20 tablet 0  . raloxifene (EVISTA) 60 MG tablet TAKE 1 TABLET BY MOUTH EVERY DAY 30 tablet 1  . saccharomyces boulardii (FLORASTOR) 250 MG capsule Take 250 mg by mouth 2 (two) times daily.    . TRAVATAN Z 0.004 % SOLN ophthalmic solution Place 1 drop into the right eye at bedtime.    . TURMERIC PO Take 1 capsule by mouth daily after breakfast.    . vitamin B-12 (CYANOCOBALAMIN) 500 MCG tablet Take 500 mcg by mouth daily.     No current facility-administered medications for this visit.    Facility-Administered Medications Ordered in Other Visits  Medication Dose Route Frequency Provider Last Rate Last Dose  . denosumab (PROLIA) injection 60 mg  60 mg Subcutaneous Once [name], MD        PHYSICAL EXAMINATION: ECOG PERFORMANCE STATUS: 1 - Symptomatic but completely ambulatory  Vitals:   03/20/18 1502  BP: (!) 154/73  Pulse: 100  Resp: 18  Temp: 97.7 F (36.5 C)  SpO2: 99%    Filed Weights   03/20/18 1502  Weight: 141 lb (64 kg)   .Body mass index is 26.64 kg/m.  [name] GENERAL:alert, in no acute distress and comfortable SKIN: no acute rashes, no  significant lesions EYES: conjunctiva are pink and non-injected, sclera anicteric OROPHARYNX: MMM, no exudates, no oropharyngeal erythema or ulceration NECK: supple, no JVD LYMPH:  no palpable lymphadenopathy in the cervical, axillary or inguinal regions LUNGS: clear to auscultation b/l with normal respiratory effort HEART: regular rate & rhythm ABDOMEN:  normoactive bowel sounds , non tender, not distended. No palpable hepatosplenomegaly.  BREAST: No focal tenderness, no focal palpable masses or lumps, no focal skin changes or redness. Scribe and Electronics engineer present for breast exam.  Extremity: no pedal edema PSYCH: alert & oriented x 3 with fluent speech NEURO: no focal  motor/sensory deficits    LABORATORY DATA:   I have reviewed the data as listed  . CBC Latest Ref Rng & Units 03/20/2018 03/06/2018 12/05/2017  WBC 4.0 - 10.5 K/uL 7.4 6.1 5.9  Hemoglobin 12.0 - 15.0 g/dL 11.0(L) 10.5(L) 11.1(L)  Hematocrit 36.0 - 46.0 % 35.0(L) 32.5(L) 34.5(L)  Platelets 150 - 400 K/uL 328 312 394   hgb 10.6 up from 9.9 . CMP Latest Ref Rng & Units 03/20/2018 03/06/2018 12/05/2017  Glucose 70 - 99 mg/dL 130(H) 88 96  BUN 8 - 23 mg/dL _0 Creatinine 0.44 - 1.00 mg/dL 0.79 0.66 0.69  Sodium 135 - 145 mmol/L 138 143 138  Potassium 3.5 - 5.1 mmol/L 3.8 5.2 4.6  Chloride 98 - 111 mmol/L 100 105 100  CO2 22 - 32 mmol/L _1 Calcium 8.9 - 10.3 mg/dL 9.7 9.6 9.6  Total Protein 6.5 - 8.1 g/dL 7.2 6.7 6.8  Total Bilirubin 0.3 - 1.2 mg/dL 0.3 0.4 0.3  Alkaline Phos 38 - 126 U/L 86 - -  AST 15 - 41 U/L _2 ALT 0 - 44 U/L _3 OH vit D - 54.7---> 49.2  . Lab Results  Component Value Date   IRON 81 04/18/2017   TIBC 313 04/18/2017   IRONPCTSAT 26 04/18/2017   (Iron and TIBC)  Lab Results  Component Value Date   FERRITIN 235 03/20/2018   [id number]        Microscopic Comment 1. BREAST, INVASIVE TUMOR Procedure: Seed localized lumpectomy. Laterality:  Left breast. Tumor Size (gross measurement): 1.0 cm Histologic Type: Ductal Grade: II Tubular Differentiation: 3 Nuclear Pleomorphism: 2 Mitotic Count: 1 Ductal Carcinoma in Situ (DCIS): Not identified Extent of Tumor: Confined to breast parenchyma Margins: Invasive carcinoma, distance from closest margin: Focally 0.1 cm to the anterior margin Regional Lymph Nodes: Number of Lymph Nodes Examined: 1 Number of Sentinel Lymph Nodes Examined: 1 Lymph Nodes with Macrometastases: 0 Lymph Nodes with Micrometastases: 0 Lymph Nodes with Isolated Tumor Cells: 0 Breast Prognostic Profile: [id number] Estrogen Receptor: 100%, strong Progesterone Receptor: 5%, strong Her2: No amplification was detected. The ratio was 1.27 1 of 3 FINAL for KAISEY, HUSEBY B [phone]) Microscopic Comment(continued) Ki-67: 15% Pathologic Stage Classification (pTNM, AJCC 8th Edition): Primary Tumor (pT): pT1b Regional Lymph Nodes (pN): pN0 (JBK:ecj 07/12/2016) Enid Cutter MD Pathologist, Electronic Signature (Case signed 07/12/2016)     CLINICAL DATA:  Followup for probably benign right breast mass and probably benign left breast calcifications.  EXAM: 2D DIGITAL DIAGNOSTIC BILATERAL MAMMOGRAM WITH CAD AND ADJUNCT TOMO  BILATERAL BREAST ULTRASOUND  COMPARISON:  Previous exam(s).  ACR Breast Density Category c: The breast tissue is heterogeneously dense, which may obscure small masses.  FINDINGS: No suspicious masses or calcifications are seen in the right breast. The oval circumscribed mass in the upper-outer posterior right breast appears unchanged. Spot compression magnification views were performed over the inner left breast demonstrating unchanged 3 mm group of punctate calcifications. There is a mass with irregular margins in the lower inner left breast measuring approximately 7-8 mm.  Mammographic images were processed with CAD.  Physical examination of the left breast reveals  a firm nodule at the 8 o'clock position.  Targeted ultrasound of the left breast was performed demonstrating an irregular hypoechoic mass at 8 o'clock 2 cm from nipple measuring 0.8 x 0.6 x 0.7 cm. This corresponds well with the mass seen at mammography. No lymphadenopathy seen  in the left axilla.  Targeted ultrasound of the right breast was performed demonstrating a mixed echogenicity mass at 10 o'clock 8 cm from nipple measuring 1.4 x 0.6 x 1.1 cm. Although this may represent an area of fat necrosis is indeterminate. No lymphadenopathy seen in the right axilla.  IMPRESSION: 1. Suspicious mass in the left breast at the 8 o'clock position.  2. Stable appearance of punctate calcifications in the lower inner left breast.  3. Indeterminate mass in the right breast at 10 o'clock 8 cm from the nipple which cannot be clearly characterized as an area of fat necrosis.  RECOMMENDATION: 1. Recommend ultrasound-guided biopsy of the mass in the left breast at the 8 o'clock position.  2. Recommend stereotactic guided biopsy of the calcifications in the lower inner left breast (given the suspicious mass seen in close proximity to the calcifications).  3. Recommend ultrasound-guided biopsy of the mass in the right breast at 10 o'clock position.  Biopsies are being scheduled for the patient.  I have discussed the findings and recommendations with the patient. Results were also provided in writing at the conclusion of the visit. If applicable, a reminder letter will be sent to the patient regarding the next appointment.  BI-RADS CATEGORY  4: Suspicious.   Electronically Signed   By: Everlean Alstrom M.D.   On: 05/12/2016 13:56   RADIOGRAPHIC STUDIES: I have personally reviewed the radiological images as listed and agreed with the findings in the report. No results found.  ASSESSMENT & PLAN:   77 y.o.  female with  #1 Stage I (pT1b, pN0, Mx) left sided invasive  ductal carcinoma of the breast. 1 cm in size with lymphovascular invasion grade 2/3, Ki-67 15% ER/PR positive HER-2/neu negative. One Sentinel lymph node biopsy negative No DCIS. Severe calcified benign lesions in left breast with PASH (pseudoangiomatous stromal hyperplasia).  Patient had left breast lumpectomy by Dr. Ninfa Linden on 07/09/2016.  She completed her adjuvant radiation therapy on 09/2016  Has been on Raloxifene since then for adjuvant endocrine therapy. (AI not considered due to severe osteoporosis with fractures)  #2 Severe osteoporosis - multiple risk factors including chronic steroid therapy, age, rheumatoid arthritis. Her bone density study shows significant osteoporosis despite being on Fosamax for 2 years. Last vitamin D levels are adequate at 49 She has had an osteoporotic left hip fracture around May 2016.  PLAN:  -Discussed pt labwork today, 03/20/18; blood counts and chemistries are stable. Ferritin is at 235 -Continue with PO 33m Evista every day -No indication for a blood transfusion or IV Iron infusion -Continue Prolia every 6 months -Proceed with mammogram in December 2019 -Offered flu shot today, pt notes that she will obtain this with her PCP -Continue 859maspirin and 5000 units Vitamin D replacement  -Breast exam with scribe and nurse tech chaperone was normal  -The pt shows no clinical or lab progression/return of breast cancer at this time. -Will see the pt back in 3 months with labs    #2 Rheumatoid arthritis on Orencia per rheumatology Plan -Continue follow-up with Dr. DeEstanislado Pandyrom rheumatology for ongoing treatments. -Is no longer taking Prednisone   #3 Anemia of chronic disease due to chronic inflammation from RA  Plan -no indication for additional IV Iron at this time -continue to optimize RA management.   Continue Prolia q6m62monthammogram in 12 weeks RTC with Dr KalIrene Limbo [id number] labs   The total time spent in the appt was 25  minutes and more than 50%  was on counseling and direct patient cares.     Sullivan Lone MD MS AAHIVMS [facility] Hematology/Oncology Physician [facility]  (Office):       [phone] (Work cell):  [phone] (Fax):           [phone]  I, Baldwin [place], am acting as a scribe for Dr. Irene Limbo  .I have reviewed the above documentation for accuracy and completeness, and I agree with the above. [name] MD

## 2018-03-20 NOTE — Patient Instructions (Signed)

## 2018-03-21 LAB — VITAMIN D 25 HYDROXY (VIT D DEFICIENCY, FRACTURES): Vit D, 25-Hydroxy: 56.4 ng/mL (ref 30.0–100.0)

## 2018-03-21 MED ORDER — RALOXIFENE HCL 60 MG PO TABS: 60.0000 mg | ORAL_TABLET | Freq: Every day | ORAL | 11 refills | Status: DC

## 2018-03-31 ENCOUNTER — Telehealth: Payer: Self-pay | Admitting: *Deleted

## 2018-03-31 MED ORDER — PREDNISONE 5 MG PO TABS: ORAL_TABLET | ORAL | 0 refills | Status: DC

## 2018-03-31 NOTE — Telephone Encounter (Signed)
Patient states she is having pain in her left leg underneath the knee and on the aide. Patient states it is arthritis pain. Patient states it pain patient denies swelling.Patient denies warmth.  Patient states she is having aching through her body. Patient has tried tylenol with no relief. Patient states this had been going on for 3-4 days. She is on [place] and she is taking them as prescribed. She was last seen in the office on 03-06-18 and due to follow up 08-07-18. Patient is requesting a prescription for prednisone. Please advise.

## 2018-03-31 NOTE — Telephone Encounter (Signed)
Prescription sent to the pharmacy and patient advised.  

## 2018-03-31 NOTE — Telephone Encounter (Signed)
Okay to give prednisone taper starting at 20 mg and taper by 5 mg every 2 days.

## 2018-04-06 MED FILL — ORENCIA 125 MG/ML SYRINGE: 125 | 28 days supply | Qty: 4 | Fill #2

## 2018-04-25 ENCOUNTER — Other Ambulatory Visit: Payer: Self-pay | Admitting: Rheumatology

## 2018-04-25 NOTE — Telephone Encounter (Signed)
Last visit: 03/06/18 Next visit: 08/07/18 Labs: 03/20/18 elevated glucose Hgb 11.0 TB Gold: 03/06/18 Neg  Okay to refill per Dr. Rob Hickman

## 2018-05-04 MED FILL — ORENCIA 125 MG/ML SYRINGE: 125 | 28 days supply | Qty: 4 | Fill #0

## 2018-05-05 ENCOUNTER — Telehealth: Payer: Self-pay | Admitting: Rheumatology

## 2018-05-05 MED ORDER — PREDNISONE 5 MG PO TABS: ORAL_TABLET | ORAL | 0 refills | Status: DC

## 2018-05-05 NOTE — Telephone Encounter (Signed)
Patient states she is having pain in her hands. Patient states she is having mild swelling. Patient states she is having trouble with her daily activities and making a fist. Patient states this started about 3 days ago. Patient has tried tylenol and with little relief. Patient is on Arava 20 mg daily and Orencia 125 mg weekly. Patient was last seen 03/06/18 and due to follow up 08/06/17. Patient is requesting a prescription of prednisone. Please advise.

## 2018-05-05 NOTE — Telephone Encounter (Signed)
Patient called stating that she in some pain but no swelling.  She would like for Dr. Estanislado Pandy to call her in a RX for Prednisone.  CB#[phone].  Thank you.

## 2018-05-05 NOTE — Telephone Encounter (Signed)
Okay to give prednisone taper starting at 20 mg p.o. daily and taper by 5 mg every 2 days.

## 2018-05-05 NOTE — Telephone Encounter (Signed)
Patient advised prescription for prednisone sent to the pharmacy.

## 2018-05-08 NOTE — Telephone Encounter (Signed)
Received a Prior Authorization request from [facility] for [place]. Authorization has been submitted to patient's insurance via Cover My Meds. Will update once we receive a response.

## 2018-05-09 NOTE — Telephone Encounter (Signed)
Received a fax from [employer] regarding a prior authorization for [place]. Authorization has been APPROVED from 05/31/2018 to 05/31/2019.   Will send document to scan center.  Authorization # [id number] Phone # [phone]  8:21 AM Beatriz Chancellor, CPhT

## 2018-05-26 ENCOUNTER — Ambulatory Visit
Admission: RE | Admit: 2018-05-26 | Discharge: 2018-05-26 | Disposition: A | Discharge status: Home / Self Care | Payer: Medicare Other | Source: Ambulatory Visit | Attending: Hematology | Admitting: Hematology

## 2018-05-26 DIAGNOSIS — Z170 Estrogen receptor positive status [ER+]: Secondary | ICD-10-CM

## 2018-05-26 DIAGNOSIS — C50312 Malignant neoplasm of lower-inner quadrant of left female breast: Secondary | ICD-10-CM

## 2018-06-01 MED FILL — ORENCIA 125 MG/ML SYRINGE: 125 | 28 days supply | Qty: 4 | Fill #1

## 2018-06-16 NOTE — Progress Notes (Signed)
[name]  HEMATOLOGY ONCOLOGY CLINIC NOTE  Date of service: 06/19/18   Patient Care Team: Vernie Shanks, MD as PCP - General (Family Medicine)  Chief Complaint:  F/u for ER/PR positive Her neg Breast cancer  Diagnosis  Stage I (pT1b, pN0, Mx) left sided invasive ductal carcinoma of the breast. 1 cm in size with lymphovascular invasion grade 2/3, Ki-67 15% ER/PR positive HER-2/neu negative. One Sentinel lymph node biopsy negative No DCIS. Severe calcified benign lesions in left breast with PASH (pseudoangiomatous stromal hyperplasia).  Patient is status post left breast lumpectomy by Dr. Ninfa Linden on 07/09/2016.   S/p adjuvant  Left breast RT on 10/05/2016  INTERVAL HISTORY:  Ms Woodmansee is here for her scheduled follow-up for stage I left breast cancer. The patient's last visit with [place] was on 03/20/18. The pt reports that she is doing well overall.  The pt reports that she has continued on weekly Orencia, which has kept her arthritic pains stable. She notes that with the colder weather however, she has had some more pain. Otherwise, the pt denies any new concerns in the interim. She notes that she has had good energy levels, has been eating well, and endorses stable weight.   The pt denies any new concerning breast abnormalities, lumps, bumps, or skin lesions.   The pt denies any difficulty tolerating Evista.  Of note since the patient's last visit, pt has had a MM Diag Breast TOMO Bilateral completed on 05/26/18 with results revealing No mammographic evidence of malignancy in either breast. 2. Stable left breast posttreatment changes.  Lab results today (06/19/18) of CBC w/diff is as follows: all values are WNL except for RBC at 3.77, HGB at 10.5, HCT at 33.4, Glucose at 153. 06/19/18 CMP is stable 06/19/18 Ferritin is wnl at 163 06/19/18 Vitamin D is pending   On review of systems, pt reports eating well, stable weight, some arthritic pains, good energy levels, and denies fevers, chills,  night sweats, unexpected weight loss, hot flashes, breast symptoms, pain along the spine, diarrhea, depression, calf pain, abdominal pains, leg swelling, and any other symptoms.   REVIEW OF SYSTEMS:    A 10+ POINT REVIEW OF SYSTEMS WAS OBTAINED including neurology, dermatology, psychiatry, cardiac, respiratory, lymph, extremities, GI, GU, Musculoskeletal, constitutional, breasts, reproductive, HEENT.  All pertinent positives are noted in the HPI.  All others are negative.  . Past Medical History:  Diagnosis Date  . Anemia   . Arthritis    rheumatoid +CCP neg RF +ANA synovitis   . Asthma   . Cancer [facility])    Breast cancer  . Glaucoma    right eye  . Osteoporosis   . Personal history of radiation therapy 09/2016     . Past Surgical History:  Procedure Laterality Date  . BREAST BIOPSY Left 05/14/2016   x2  . BREAST BIOPSY Right 05/14/2016  . BREAST LUMPECTOMY Left 07/09/2016  . BREAST LUMPECTOMY WITH RADIOACTIVE SEED AND SENTINEL LYMPH NODE BIOPSY Left 07/09/2016   Procedure: BREAST LUMPECTOMY WITH RADIOACTIVE SEED AND SENTINEL LYMPH NODE BIOPSY;  Surgeon: Coralie Keens, MD;  Location: [place];  Service: General;  Laterality: Left;  . NO PAST SURGERIES    . TOTAL HIP ARTHROPLASTY Left 10/01/2014   Procedure: TOTAL HIP ARTHROPLASTY;  Surgeon: Garald Balding, MD;  Location: [place];  Service: Orthopedics;  Laterality: Left;    . Social History   Tobacco Use  . Smoking status: Never Smoker  . Smokeless tobacco: Never Used  Substance Use Topics  .  Alcohol use: No  . Drug use: Never    ALLERGIES:  is allergic to penicillins and sulfa antibiotics.  MEDICATIONS:  Current Outpatient Medications  Medication Sig Dispense Refill  . acetaminophen (TYLENOL) 650 MG CR tablet Take 1,300 mg by mouth 2 (two) times daily as needed for pain. Take one hour prior to infusion     . albuterol (PROVENTIL HFA;VENTOLIN HFA) 108 (90 Base) MCG/ACT inhaler Inhale 1-2 puffs into the lungs every 6  (six) hours as needed for wheezing or shortness of breath.    [name] amLODipine (NORVASC) 5 MG tablet Take 5 mg by mouth daily.     [name] aspirin EC 81 MG tablet Take 81 mg by mouth daily.    . calcium acetate (PHOSLO) 667 MG capsule Take by mouth 3 (three) times daily with meals.    . calcium gluconate 500 MG tablet Take 2 tablets by mouth daily.    . Cholecalciferol (VITAMIN D3) 5000 units TABS Take 5,000 Units by mouth daily after breakfast.    . leflunomide (ARAVA) 20 MG tablet TAKE 1 TABLET BY MOUTH EVERY DAY 90 tablet 0  . Multiple Vitamin (MULTIVITAMIN WITH MINERALS) TABS tablet Take 1 tablet by mouth daily.    . Omega-3 Fatty Acids (OMEGA-3 FISH OIL) 1200 MG CAPS Take 1,200 mg by mouth 2 (two) times daily.    [name] ORENCIA 125 MG/ML SOSY INJECT 125 MG INTO THE SKIN ONCE A WEEK. 12 mL 0  . Polyethyl Glycol-Propyl Glycol (SYSTANE OP) Place 1 drop into the left eye at bedtime.    . predniSONE (DELTASONE) 5 MG tablet Take 4 tabs by mouth x2 days, 3 tabs x2 days, 2 tabs x2 days, 1 tab x2 days. 20 tablet 0  . raloxifene (EVISTA) 60 MG tablet Take 1 tablet (60 mg total) by mouth daily. 30 tablet 11  . saccharomyces boulardii (FLORASTOR) 250 MG capsule Take 250 mg by mouth 2 (two) times daily.    . TRAVATAN Z 0.004 % SOLN ophthalmic solution Place 1 drop into the right eye at bedtime.    . TURMERIC PO Take 1 capsule by mouth daily after breakfast.    . vitamin B-12 (CYANOCOBALAMIN) 500 MCG tablet Take 500 mcg by mouth daily.     No current facility-administered medications for this visit.     PHYSICAL EXAMINATION: ECOG PERFORMANCE STATUS: 1 - Symptomatic but completely ambulatory  Vitals:   06/19/18 1457  BP: (!) 149/80  Pulse: (!) 103  Resp: 18  Temp: 98.1 F (36.7 C)  SpO2: 98%    Filed Weights   06/19/18 1457  Weight: 140 lb 9.6 oz (63.8 kg)   .Body mass index is 26.57 kg/m.  GENERAL:alert, in no acute distress and comfortable SKIN: no acute rashes, no significant lesions EYES:  conjunctiva are pink and non-injected, sclera anicteric OROPHARYNX: MMM, no exudates, no oropharyngeal erythema or ulceration NECK: supple, no JVD LYMPH:  no palpable lymphadenopathy in the cervical, axillary or inguinal regions LUNGS: clear to auscultation b/l with normal respiratory effort HEART: regular rate & rhythm ABDOMEN:  normoactive bowel sounds , non tender, not distended. No palpable hepatosplenomegaly.  Extremity: no pedal edema PSYCH: alert & oriented x 3 with fluent speech NEURO: no focal motor/sensory deficits    LABORATORY DATA:   I have reviewed the data as listed  . CBC Latest Ref Rng & Units 06/19/2018 03/20/2018 03/06/2018  WBC 4.0 - 10.5 K/uL 4.7 7.4 6.1  Hemoglobin 12.0 - 15.0 g/dL 10.5(L) 11.0(L) 10.5(L)  Hematocrit  36.0 - 46.0 % 33.4(L) 35.0(L) 32.5(L)  Platelets 150 - 400 K/uL 245 328 312   . CMP Latest Ref Rng & Units 06/19/2018 03/20/2018 03/06/2018  Glucose 70 - 99 mg/dL 153(H) 130(H) 88  BUN 8 - 23 mg/dL '12 10 10  '$ Creatinine 0.44 - 1.00 mg/dL 0.75 0.79 0.66  Sodium 135 - 145 mmol/L 138 138 143  Potassium 3.5 - 5.1 mmol/L 4.1 3.8 5.2  Chloride 98 - 111 mmol/L 102 100 105  CO2 22 - 32 mmol/L '29 30 28  '$ Calcium 8.9 - 10.3 mg/dL 9.4 9.7 9.6  Total Protein 6.5 - 8.1 g/dL 7.0 7.2 6.7  Total Bilirubin 0.3 - 1.2 mg/dL 0.3 0.3 0.4  Alkaline Phos 38 - 126 U/L 81 86 -  AST 15 - 41 U/L '27 28 31  '$ ALT 0 - 44 U/L '28 28 22    25 '$ OH vit D - 54.7---> 49.2  . Lab Results  Component Value Date   IRON 81 04/18/2017   TIBC 313 04/18/2017   IRONPCTSAT 26 04/18/2017   (Iron and TIBC)  Lab Results  Component Value Date   FERRITIN 163 06/19/2018   [id number]        Microscopic Comment 1. BREAST, INVASIVE TUMOR Procedure: Seed localized lumpectomy. Laterality: Left breast. Tumor Size (gross measurement): 1.0 cm Histologic Type: Ductal Grade: II Tubular Differentiation: 3 Nuclear Pleomorphism: 2 Mitotic Count: 1 Ductal Carcinoma in Situ (DCIS): Not  identified Extent of Tumor: Confined to breast parenchyma Margins: Invasive carcinoma, distance from closest margin: Focally 0.1 cm to the anterior margin Regional Lymph Nodes: Number of Lymph Nodes Examined: 1 Number of Sentinel Lymph Nodes Examined: 1 Lymph Nodes with Macrometastases: 0 Lymph Nodes with Micrometastases: 0 Lymph Nodes with Isolated Tumor Cells: 0 Breast Prognostic Profile: [id number] Estrogen Receptor: 100%, strong Progesterone Receptor: 5%, strong Her2: No amplification was detected. The ratio was 1.27 1 of 3 FINAL for DERIONA, ALTEMOSE B [phone]) Microscopic Comment(continued) Ki-67: 15% Pathologic Stage Classification (pTNM, AJCC 8th Edition): Primary Tumor (pT): pT1b Regional Lymph Nodes (pN): pN0 (JBK:ecj 07/12/2016) Enid Cutter MD Pathologist, Electronic Signature (Case signed 07/12/2016)     CLINICAL DATA:  Followup for probably benign right breast mass and probably benign left breast calcifications.  EXAM: 2D DIGITAL DIAGNOSTIC BILATERAL MAMMOGRAM WITH CAD AND ADJUNCT TOMO  BILATERAL BREAST ULTRASOUND  COMPARISON:  Previous exam(s).  ACR Breast Density Category c: The breast tissue is heterogeneously dense, which may obscure small masses.  FINDINGS: No suspicious masses or calcifications are seen in the right breast. The oval circumscribed mass in the upper-outer posterior right breast appears unchanged. Spot compression magnification views were performed over the inner left breast demonstrating unchanged 3 mm group of punctate calcifications. There is a mass with irregular margins in the lower inner left breast measuring approximately 7-8 mm.  Mammographic images were processed with CAD.  Physical examination of the left breast reveals a firm nodule at the 8 o'clock position.  Targeted ultrasound of the left breast was performed demonstrating an irregular hypoechoic mass at 8 o'clock 2 cm from nipple measuring 0.8 x 0.6 x  0.7 cm. This corresponds well with the mass seen at mammography. No lymphadenopathy seen in the left axilla.  Targeted ultrasound of the right breast was performed demonstrating a mixed echogenicity mass at 10 o'clock 8 cm from nipple measuring 1.4 x 0.6 x 1.1 cm. Although this may represent an area of fat necrosis is indeterminate. No lymphadenopathy seen in the right axilla.  IMPRESSION:  1. Suspicious mass in the left breast at the 8 o'clock position.  2. Stable appearance of punctate calcifications in the lower inner left breast.  3. Indeterminate mass in the right breast at 10 o'clock 8 cm from the nipple which cannot be clearly characterized as an area of fat necrosis.  RECOMMENDATION: 1. Recommend ultrasound-guided biopsy of the mass in the left breast at the 8 o'clock position.  2. Recommend stereotactic guided biopsy of the calcifications in the lower inner left breast (given the suspicious mass seen in close proximity to the calcifications).  3. Recommend ultrasound-guided biopsy of the mass in the right breast at 10 o'clock position.  Biopsies are being scheduled for the patient.  I have discussed the findings and recommendations with the patient. Results were also provided in writing at the conclusion of the visit. If applicable, a reminder letter will be sent to the patient regarding the next appointment.  BI-RADS CATEGORY  4: Suspicious.   Electronically Signed   By: Everlean Alstrom M.D.   On: 05/12/2016 13:56   RADIOGRAPHIC STUDIES: I have personally reviewed the radiological images as listed and agreed with the findings in the report. Mm Diag Breast Tomo Bilateral  Result Date: 05/26/2018 CLINICAL DATA:  78 year old female status post malignant left lumpectomy with radiation therapy in 2018. No current symptoms. EXAM: DIGITAL DIAGNOSTIC BILATERAL MAMMOGRAM WITH CAD AND TOMO COMPARISON:  Previous exam(s). ACR Breast Density Category c: The  breast tissue is heterogeneously dense, which may obscure small masses. FINDINGS: Stable post lumpectomy changes are noted in the lower inner quadrant of the left breast. Diffuse left-sided skin and trabecular thickening is consistent with radiation related changes. No new or suspicious findings are identified in either breast. The parenchymal pattern is stable. Mammographic images were processed with CAD. IMPRESSION: 1. No mammographic evidence of malignancy in either breast. 2. Stable left breast posttreatment changes. RECOMMENDATION: Diagnostic mammogram is suggested in 1 year. (Code:[id number]-B-01Y) I have discussed the findings and recommendations with the patient. Results were also provided in writing at the conclusion of the visit. If applicable, a reminder letter will be sent to the patient regarding the next appointment. BI-RADS CATEGORY  2: Benign. Electronically Signed   By: Kristopher Oppenheim M.D.   On: 05/26/2018 16:17    ASSESSMENT & PLAN:   78 y.o.  female with  #1 Stage I (pT1b, pN0, Mx) left sided invasive ductal carcinoma of the breast. 1 cm in size with lymphovascular invasion grade 2/3, Ki-67 15% ER/PR positive HER-2/neu negative. One Sentinel lymph node biopsy negative No DCIS. Severe calcified benign lesions in left breast with PASH (pseudoangiomatous stromal hyperplasia).  Patient had left breast lumpectomy by Dr. Ninfa Linden on 07/09/2016.  She completed her adjuvant radiation therapy on 09/2016  Has been on Raloxifene since then for adjuvant endocrine therapy. (AI not considered due to severe osteoporosis with fractures)  #2 Severe osteoporosis - multiple risk factors including chronic steroid therapy, age, rheumatoid arthritis. Her bone density study shows significant osteoporosis despite being on Fosamax for 2 years. Last vitamin D levels are adequate at 49 She has had an osteoporotic left hip fracture around May 2016.  #3 Rheumatoid arthritis on Orencia per  rheumatology Plan -Continue follow-up with Dr. Estanislado Pandy from rheumatology for ongoing treatments. -Is no longer taking Prednisone   #4 Anemia of chronic disease due to chronic inflammation from RA  Plan -no indication for additional IV Iron at this time -continue to optimize RA management.  PLAN:  -Discussed pt labwork today,  06/19/18; blood counts and chemistries are stable -06/19/18 Ferritin is 163 -No indication for a blood transfusion or IV Iron infusion at this time -Discussed the 05/26/18 MM which revealed No mammographic evidence of malignancy in either breast. 2. Stable left breast posttreatment changes. -The pt shows no clinical or lab progression of her breast cancer at this time. -Will order DEXA scan -Continue Prolia every 6 months -Continue '60mg'$  Evista daily -Continue '81mg'$  aspirin and 5000 units Vitamin D replacement  -Will see the pt back in 14 weeks    -please schedule next dose of Prolia in 3 months with labs. -DEXA scan in 3 months -RTC with Dr Irene Limbo in 14 weeks   The total time spent in the appt was 25 minutes and more than 50% was on counseling and direct patient cares.    Sullivan Lone MD [place] AAHIVMS [facility] Hematology/Oncology Physician [facility]  (Office):       [phone] (Work cell):  [phone] (Fax):           [phone]  I, Baldwin [place], am acting as a scribe for Dr. Sullivan Lone.   .I have reviewed the above documentation for accuracy and completeness, and I agree with the above. [name] MD

## 2018-06-19 ENCOUNTER — Inpatient Hospital Stay: Payer: Medicare Other

## 2018-06-19 ENCOUNTER — Telehealth: Payer: Self-pay | Admitting: Hematology

## 2018-06-19 ENCOUNTER — Inpatient Hospital Stay: Payer: Medicare Other | Attending: Hematology | Admitting: Hematology

## 2018-06-19 VITALS — BP 149/80 | HR 103 | Temp 98.1°F | Resp 18 | Ht 61.0 in | Wt 140.6 lb

## 2018-06-19 DIAGNOSIS — D638 Anemia in other chronic diseases classified elsewhere: Secondary | ICD-10-CM | POA: Insufficient documentation

## 2018-06-19 DIAGNOSIS — D509 Iron deficiency anemia, unspecified: Secondary | ICD-10-CM

## 2018-06-19 DIAGNOSIS — M810 Age-related osteoporosis without current pathological fracture: Secondary | ICD-10-CM | POA: Diagnosis not present

## 2018-06-19 DIAGNOSIS — Z170 Estrogen receptor positive status [ER+]: Secondary | ICD-10-CM | POA: Diagnosis not present

## 2018-06-19 DIAGNOSIS — C50312 Malignant neoplasm of lower-inner quadrant of left female breast: Secondary | ICD-10-CM | POA: Insufficient documentation

## 2018-06-19 DIAGNOSIS — C50212 Malignant neoplasm of upper-inner quadrant of left female breast: Secondary | ICD-10-CM

## 2018-06-19 DIAGNOSIS — M069 Rheumatoid arthritis, unspecified: Secondary | ICD-10-CM | POA: Insufficient documentation

## 2018-06-19 LAB — CBC WITH DIFFERENTIAL/PLATELET
Abs Immature Granulocytes: 0.01 10*3/uL (ref 0.00–0.07)
BASOS PCT: 2 %
Basophils Absolute: 0.1 10*3/uL (ref 0.0–0.1)
EOS ABS: 0.3 10*3/uL (ref 0.0–0.5)
Eosinophils Relative: 7 %
HEMATOCRIT: 33.4 % — AB (ref 36.0–46.0)
Hemoglobin: 10.5 g/dL — ABNORMAL LOW (ref 12.0–15.0)
Immature Granulocytes: 0 %
LYMPHS ABS: 1.5 10*3/uL (ref 0.7–4.0)
Lymphocytes Relative: 32 %
MCH: 27.9 pg (ref 26.0–34.0)
MCHC: 31.4 g/dL (ref 30.0–36.0)
MCV: 88.6 fL (ref 80.0–100.0)
Monocytes Absolute: 0.5 10*3/uL (ref 0.1–1.0)
Monocytes Relative: 11 %
NEUTROS ABS: 2.3 10*3/uL (ref 1.7–7.7)
NEUTROS PCT: 48 %
NRBC: 0 % (ref 0.0–0.2)
PLATELETS: 245 10*3/uL (ref 150–400)
RBC: 3.77 MIL/uL — ABNORMAL LOW (ref 3.87–5.11)
RDW: 15.3 % (ref 11.5–15.5)
WBC: 4.7 10*3/uL (ref 4.0–10.5)

## 2018-06-19 LAB — CMP (CANCER CENTER ONLY)
ALT: 28 U/L (ref 0–44)
AST: 27 U/L (ref 15–41)
Albumin: 3.5 g/dL (ref 3.5–5.0)
Alkaline Phosphatase: 81 U/L (ref 38–126)
Anion gap: 7 (ref 5–15)
BUN: 12 mg/dL (ref 8–23)
CALCIUM: 9.4 mg/dL (ref 8.9–10.3)
CO2: 29 mmol/L (ref 22–32)
Chloride: 102 mmol/L (ref 98–111)
Creatinine: 0.75 mg/dL (ref 0.44–1.00)
GFR, Estimated: >60 mL/min (ref >60)
Glucose, Bld: 153 mg/dL — ABNORMAL HIGH (ref 70–99)
Potassium: 4.1 mmol/L (ref 3.5–5.1)
Sodium: 138 mmol/L (ref 135–145)
Total Bilirubin: 0.3 mg/dL (ref 0.3–1.2)
Total Protein: 7 g/dL (ref 6.5–8.1)

## 2018-06-19 LAB — FERRITIN: Ferritin: 163 ng/mL (ref 11–307)

## 2018-06-19 NOTE — Telephone Encounter (Signed)
Gave patient avs report and appointments April and May, including bone density test. Patient already on scheduled for prolia injection in [place] (3 month).

## 2018-06-19 NOTE — Patient Instructions (Signed)
Thank you for choosing [facility] to provide your oncology and hematology care.  To afford each patient quality time with our providers, please arrive 30 minutes before your scheduled appointment time.  If you arrive late for your appointment, you may be asked to reschedule.  We strive to give you quality time with our providers, and arriving late affects you and other patients whose appointments are after yours.    If you are a no show for multiple scheduled visits, you may be dismissed from the clinic at the providers discretion.     Again, thank you for choosing [facility], our hope is that these requests will decrease the amount of time that you wait before being seen by our physicians.  ______________________________________________________________________   Should you have questions after your visit to the [facility], please contact our office at [phone] between the hours of 8:30 and 4:30 p.m.    Voicemails left after 4:30p.m will not be returned until the following business day.     For prescription refill requests, please have your pharmacy contact us directly.  Please also try to allow 48 hours for prescription requests.     Please contact the scheduling department for questions regarding scheduling.  For scheduling of procedures such as PET scans, CT scans, MRI, Ultrasound, etc please contact central scheduling at [phone].     Resources For Cancer Patients and Caregivers:    Oncolink.org:  A wonderful resource for patients and healthcare providers for information regarding your disease, ways to tract your treatment, what to expect, etc.      American Cancer Society:  800-227-2345  Can help patients locate various types of support and financial assistance   Cancer Care: 1-800-813-HOPE (4673) Provides financial assistance, online support groups, medication/co-pay assistance.     [place] DSS:  336-641-3447 Where to apply  for food stamps, Medicaid, and utility assistance   Medicare Rights Center: 800-333-4114 Helps people with Medicare understand their rights and benefits, navigate the Medicare system, and secure the quality healthcare they deserve   SCAT: 336-333-6589 [place] [employer]'s shared-ride transportation service for eligible riders who have a disability that prevents them from riding the fixed route bus.     For additional information on assistance programs please contact our social worker:   Abigail Elmore:  [phone]  

## 2018-06-20 LAB — VITAMIN D 25 HYDROXY (VIT D DEFICIENCY, FRACTURES): Vit D, 25-Hydroxy: 52.1 ng/mL (ref 30.0–100.0)

## 2018-06-29 MED FILL — ORENCIA 125 MG/ML SYRINGE: 125 | 28 days supply | Qty: 4 | Fill #2

## 2018-07-05 ENCOUNTER — Telehealth: Payer: Self-pay | Admitting: Rheumatology

## 2018-07-05 MED ORDER — PREDNISONE 5 MG PO TABS: ORAL_TABLET | ORAL | 0 refills | Status: DC

## 2018-07-05 NOTE — Telephone Encounter (Signed)
Patient states she is having tight hand and arm pain. Patient states she is not having swelling. Patient states she has been having the pain for 2 weeks. Patient states she has been using tylenol and that has not been helping much. Patient states she she has been using a patch that she gets from [place]. Patient states that it helped some. Patient states that she is having difficultly lifting. Patient states she is on Orencia with her last dose 07/04/18 and Arava 20 mg daily. Patient states she has not missed any doses of the [place]. Patient was last seen 03/06/18 and due to follow up 08/07/18.  Patient is requesting a prescription of Prednisone. Please advise.

## 2018-07-05 NOTE — Telephone Encounter (Signed)
Okay to send prednisone taper starting at 20 mg and taper by 5 mg every 4 days.

## 2018-07-05 NOTE — Telephone Encounter (Signed)
Patient states she has been hurting all over for two weeks now. Right hand, and above elbow hurts. Tylenol does not help. Patient request a rx for Prednisone to be sent to [facility] on [address].

## 2018-07-10 ENCOUNTER — Other Ambulatory Visit: Payer: Self-pay | Admitting: Rheumatology

## 2018-07-10 NOTE — Telephone Encounter (Signed)
Last visit: 03/06/18 Next visit: 08/07/18 Labs: 06/19/18 elevated glucose RBC 3.77, Hgb 10.5, Hct 33.4   Okay to refill per Dr. Estanislado Pandy

## 2018-07-20 ENCOUNTER — Other Ambulatory Visit: Payer: Self-pay | Admitting: Rheumatology

## 2018-07-20 ENCOUNTER — Telehealth: Payer: Self-pay

## 2018-07-20 DIAGNOSIS — Z79899 Other long term (current) drug therapy: Secondary | ICD-10-CM

## 2018-07-20 NOTE — Telephone Encounter (Signed)
I called patient and discussed the positive TB Gold results from October 2019.  I want her to come in to repeat TB Gold.  She will come tomorrow to get TB Gold drawn so we can have results available.  If her repeat TB Gold is positive I will obtain chest x-ray and send her to health department for treatment.  She will have to hold off Orencia for one month.

## 2018-07-20 NOTE — Telephone Encounter (Signed)
Received refill request for orencia. (denied request)   Upon reviewing the necessary information needed to refill, I noticed the patient's TB gold resulted as positive on 03/06/2018.  Maureen Chatters was refilled on 04/25/2018 and the refill note that was made stated the tb gold was negative.   I reviewed this situation with Dr. Estanislado Pandy and immediately called the patient and advised patient to hold orencia (patient's last dose was 07/18/2018). Patient advised to come to the office to have TB Gold rechecked and to hold orencia until we get the lab result back. Patient verbalized understanding and states she can come Monday (07/24/2018) to have TB gold drawn.

## 2018-07-20 NOTE — Telephone Encounter (Signed)
TB Gold lab order has been placed.

## 2018-07-21 ENCOUNTER — Other Ambulatory Visit: Payer: Self-pay

## 2018-07-21 DIAGNOSIS — Z79899 Other long term (current) drug therapy: Secondary | ICD-10-CM

## 2018-07-22 LAB — QUANTIFERON-TB GOLD PLUS
Mitogen-NIL: 8.74 IU/mL
NIL: 0.29 IU/mL
QuantiFERON-TB Gold Plus: POSITIVE — AB
TB1-NIL: 0.59 IU/mL
TB2-NIL: 0.53 IU/mL

## 2018-07-24 ENCOUNTER — Other Ambulatory Visit: Payer: Self-pay | Admitting: *Deleted

## 2018-07-24 DIAGNOSIS — R7612 Nonspecific reaction to cell mediated immunity measurement of gamma interferon antigen response without active tuberculosis: Secondary | ICD-10-CM

## 2018-07-24 NOTE — Progress Notes (Deleted)
Office Visit Note  Patient: Felicia Gonzalez             Date of Birth: Jun 14, 1940           MRN: [id number]             PCP: Vernie Shanks, MD Referring: Vernie Shanks, MD Visit Date: 08/07/2018 Occupation: @GUAROCC @  Subjective:  No chief complaint on file.   History of Present Illness: Felicia Gonzalez is a 78 y.o. female ***   Activities of Daily Living:  Patient reports morning stiffness for *** {minute/hour:19697}.   Patient {ACTIONS;DENIES/REPORTS:21021675::"Denies"} nocturnal pain.  Difficulty dressing/grooming: {ACTIONS;DENIES/REPORTS:21021675::"Denies"} Difficulty climbing stairs: {ACTIONS;DENIES/REPORTS:21021675::"Denies"} Difficulty getting out of chair: {ACTIONS;DENIES/REPORTS:21021675::"Denies"} Difficulty using hands for taps, buttons, cutlery, and/or writing: {ACTIONS;DENIES/REPORTS:21021675::"Denies"}  No Rheumatology ROS completed.   PMFS History:  Patient Active Problem List   Diagnosis Date Noted  . Malignant neoplasm of lower-inner quadrant of left breast in female, estrogen receptor positive ([place]) 10/04/2016  . Osteoporosis 10/04/2016  . Malignant neoplasm of upper-inner quadrant of left breast in female, estrogen receptor positive ([place]) 08/26/2016  . High risk medication use 06/08/2016  . Age-related osteoporosis without current pathological fracture 06/08/2016  . ANA positive 06/08/2016  . Moderate persistent asthma 06/08/2016  . Normocytic anemia 10/01/2014  . Rheumatoid arthritis of multiple sites without organ or system involvement with positive rheumatoid factor ([place]) 10/01/2014  . Subcapital fracture of left hip ([place]) 10/01/2014    Past Medical History:  Diagnosis Date  . Anemia   . Arthritis    rheumatoid +CCP neg RF +ANA synovitis   . Asthma   . Cancer [facility])    Breast cancer  . Glaucoma    right eye  . Osteoporosis   . Personal history of radiation therapy 09/2016    Family History  Problem Relation Age of Onset  . Hypertension  Father    Past Surgical History:  Procedure Laterality Date  . BREAST BIOPSY Left 05/14/2016   x2  . BREAST BIOPSY Right 05/14/2016  . BREAST LUMPECTOMY Left 07/09/2016  . BREAST LUMPECTOMY WITH RADIOACTIVE SEED AND SENTINEL LYMPH NODE BIOPSY Left 07/09/2016   Procedure: BREAST LUMPECTOMY WITH RADIOACTIVE SEED AND SENTINEL LYMPH NODE BIOPSY;  Surgeon: Coralie Keens, MD;  Location: [place];  Service: General;  Laterality: Left;  . NO PAST SURGERIES    . TOTAL HIP ARTHROPLASTY Left 10/01/2014   Procedure: TOTAL HIP ARTHROPLASTY;  Surgeon: Garald Balding, MD;  Location: [place];  Service: Orthopedics;  Laterality: Left;   Social History   Social History Narrative  . Not on file    There is no immunization history on file for this patient.   Objective: Vital Signs: There were no vitals taken for this visit.   Physical Exam   Musculoskeletal Exam: ***  CDAI Exam: CDAI Score: Not documented Patient Global Assessment: Not documented; Provider Global Assessment: Not documented Swollen: Not documented; Tender: Not documented Joint Exam   Not documented   There is currently no information documented on the homunculus. Go to the Rheumatology activity and complete the homunculus joint exam.  Investigation: No additional findings.  Imaging: No results found.  Recent Labs: Lab Results  Component Value Date   WBC 4.7 06/19/2018   HGB 10.5 (L) 06/19/2018   PLT 245 06/19/2018   NA 138 06/19/2018   K 4.1 06/19/2018   CL 102 06/19/2018   CO2 29 06/19/2018   GLUCOSE 153 (H) 06/19/2018   BUN 12 06/19/2018   CREATININE  0.75 06/19/2018   BILITOT 0.3 06/19/2018   ALKPHOS 81 06/19/2018   AST 27 06/19/2018   ALT 28 06/19/2018   PROT 7.0 06/19/2018   ALBUMIN 3.5 06/19/2018   CALCIUM 9.4 06/19/2018   GFRAA >60 06/19/2018   QFTBGOLD Negative 03/10/2016   QFTBGOLDPLUS POSITIVE (A) 07/21/2018    Speciality Comments: Orencia IV every 4 weeks  Procedures:  No procedures  performed Allergies: Penicillins and Sulfa antibiotics   Assessment / Plan:     Visit Diagnoses: No diagnosis found.   Orders: No orders of the defined types were placed in this encounter.  No orders of the defined types were placed in this encounter.   Face-to-face time spent with patient was *** minutes. Greater than 50% of time was spent in counseling and coordination of care.  Follow-Up Instructions: No follow-ups on file.   Earnestine Mealing, CMA  Note - This record has been created using Editor, commissioning.  Chart creation errors have been sought, but may not always  have been located. Such creation errors do not reflect on  the standard of medical care.

## 2018-07-24 NOTE — Progress Notes (Signed)
Patient's repeat TB Gold test is positive as well.  Please get a baseline chest x-ray PA and lateral.  Please refer her to health department urgently.  She may continue very well with treatment for latent tuberculosis.  We will stop Orencia and restart 1 month after she starts on treatment for latent tuberculosis.

## 2018-07-24 NOTE — Progress Notes (Signed)
I had detailed discussion with the patient.  I discussed that she had repeat TB Gold positive.  She has been advised to go to radiology to get chest x-ray.  I have also advised that we will schedule appointment with the health department for treatment of latent tuberculosis.  She will hold [place] until we have chest x-ray results back.  Once chest x-ray results are back she will restart Arava.  Once she start treatment for latent tuberculosis she will be able to resume Orencia after 1 month.

## 2018-07-25 ENCOUNTER — Ambulatory Visit (HOSPITAL_COMMUNITY)
Admission: RE | Admit: 2018-07-25 | Discharge: 2018-07-25 | Disposition: A | Discharge status: Home / Self Care | Payer: Medicare Other | Source: Ambulatory Visit | Attending: Rheumatology | Admitting: Rheumatology

## 2018-07-25 DIAGNOSIS — R7612 Nonspecific reaction to cell mediated immunity measurement of gamma interferon antigen response without active tuberculosis: Secondary | ICD-10-CM | POA: Insufficient documentation

## 2018-07-26 NOTE — Telephone Encounter (Signed)
Chest x-ray normal, I called patient, referral placed for Health Department for Positive TB Gold test, patient requesting Monday, Wednesday, Friday appt

## 2018-07-26 NOTE — Progress Notes (Signed)
Please notify patient that the chest x-ray is normal.

## 2018-08-01 ENCOUNTER — Telehealth: Payer: Self-pay | Admitting: Rheumatology

## 2018-08-01 MED ORDER — PREDNISONE 5 MG PO TABS: 5.0000 mg | ORAL_TABLET | Freq: Every day | ORAL | 1 refills | Status: DC

## 2018-08-01 NOTE — Telephone Encounter (Signed)
Patient states she stopped her Orencia about a week ago. Patient states she is having pain and swelling in both hands and wrists. Patient states it is interfering with her sleep. Patient states she took some tylenol. Patient is requesting prescription for Prednisone. Please advise.

## 2018-08-01 NOTE — Telephone Encounter (Signed)
Patient advised prescription for Prednisone sent to the pharmacy. Patient advised to continue [place] daily per Dr. Estanislado Pandy. Patient reminded to start treat for TB as soon as possible so we will be able to restart her Orencia. Patient states she will have the appointment on 08/09/18.

## 2018-08-01 NOTE — Telephone Encounter (Signed)
Okay to call in prednisone 5 mg p.o. daily total 30 tablets with 1 refill.

## 2018-08-01 NOTE — Telephone Encounter (Signed)
Patient called requesting prescription refill of Prednisone 5 mg to be sent to [facility] on [address]  Patient states she has swelling and pain in her hands and is unable to sleep.  Patient states the pain has increased since she stopped taking the Orencia.

## 2018-08-02 NOTE — Progress Notes (Deleted)
Office Visit Note  Patient: Felicia Gonzalez             Date of Birth: 1940/08/11           MRN: [id number]             PCP: Vernie Shanks, MD Referring: Vernie Shanks, MD Visit Date: 08/16/2018 Occupation: @GUAROCC @  Subjective:  No chief complaint on file.  Orencia 125 mg weekly (on hold due to treatment for latent TB) and Arava 20 mg daily.  Positive TB Gold in October 2019 and positive repeat TB gold on 07/21/2018.  Chest x-ray showed no active disease on 07/25/2018.  She is currently on treatment for latent TB which is being managed by Dr. Megan Salon.  He advised to hold Orencia for at least 1 month.  Most recent CMP within normal limits and CBC stable on 06/19/2018.  Will monitor every 3 months and standing orders have been placed.  History of Present Illness: Felicia Gonzalez is a 78 y.o. female ***   Activities of Daily Living:  Patient reports morning stiffness for *** {minute/hour:19697}.   Patient {ACTIONS;DENIES/REPORTS:21021675::"Denies"} nocturnal pain.  Difficulty dressing/grooming: {ACTIONS;DENIES/REPORTS:21021675::"Denies"} Difficulty climbing stairs: {ACTIONS;DENIES/REPORTS:21021675::"Denies"} Difficulty getting out of chair: {ACTIONS;DENIES/REPORTS:21021675::"Denies"} Difficulty using hands for taps, buttons, cutlery, and/or writing: {ACTIONS;DENIES/REPORTS:21021675::"Denies"}  No Rheumatology ROS completed.   PMFS History:  Patient Active Problem List   Diagnosis Date Noted  . Malignant neoplasm of lower-inner quadrant of left breast in female, estrogen receptor positive ([place]) 10/04/2016  . Osteoporosis 10/04/2016  . Malignant neoplasm of upper-inner quadrant of left breast in female, estrogen receptor positive ([place]) 08/26/2016  . High risk medication use 06/08/2016  . Age-related osteoporosis without current pathological fracture 06/08/2016  . ANA positive 06/08/2016  . Moderate persistent asthma 06/08/2016  . Normocytic anemia 10/01/2014  . Rheumatoid arthritis  of multiple sites without organ or system involvement with positive rheumatoid factor ([place]) 10/01/2014  . Subcapital fracture of left hip ([place]) 10/01/2014    Past Medical History:  Diagnosis Date  . Anemia   . Arthritis    rheumatoid +CCP neg RF +ANA synovitis   . Asthma   . Cancer [facility])    Breast cancer  . Glaucoma    right eye  . Osteoporosis   . Personal history of radiation therapy 09/2016    Family History  Problem Relation Age of Onset  . Hypertension Father    Past Surgical History:  Procedure Laterality Date  . BREAST BIOPSY Left 05/14/2016   x2  . BREAST BIOPSY Right 05/14/2016  . BREAST LUMPECTOMY Left 07/09/2016  . BREAST LUMPECTOMY WITH RADIOACTIVE SEED AND SENTINEL LYMPH NODE BIOPSY Left 07/09/2016   Procedure: BREAST LUMPECTOMY WITH RADIOACTIVE SEED AND SENTINEL LYMPH NODE BIOPSY;  Surgeon: Coralie Keens, MD;  Location: [place];  Service: General;  Laterality: Left;  . NO PAST SURGERIES    . TOTAL HIP ARTHROPLASTY Left 10/01/2014   Procedure: TOTAL HIP ARTHROPLASTY;  Surgeon: Garald Balding, MD;  Location: [place];  Service: Orthopedics;  Laterality: Left;   Social History   Social History Narrative  . Not on file    There is no immunization history on file for this patient.   Objective: Vital Signs: There were no vitals taken for this visit.   Physical Exam   Musculoskeletal Exam: ***  CDAI Exam: CDAI Score: Not documented Patient Global Assessment: Not documented; Provider Global Assessment: Not documented Swollen: Not documented; Tender: Not documented Joint Exam   Not documented  There is currently no information documented on the homunculus. Go to the Rheumatology activity and complete the homunculus joint exam.  Investigation: No additional findings.  Imaging: Dg Chest 2 View  Result Date: 07/26/2018 CLINICAL DATA:  Positive QuantiFERON EXAM: CHEST - 2 VIEW COMPARISON:  07/16/2015 FINDINGS: The heart size and mediastinal contours are  within normal limits. Both lungs are clear. The visualized skeletal structures are unremarkable. IMPRESSION: No active cardiopulmonary disease. Electronically Signed   By: Kathreen Devoid   On: 07/26/2018 11:04    Recent Labs: Lab Results  Component Value Date   WBC 4.7 06/19/2018   HGB 10.5 (L) 06/19/2018   PLT 245 06/19/2018   NA 138 06/19/2018   K 4.1 06/19/2018   CL 102 06/19/2018   CO2 29 06/19/2018   GLUCOSE 153 (H) 06/19/2018   BUN 12 06/19/2018   CREATININE 0.75 06/19/2018   BILITOT 0.3 06/19/2018   ALKPHOS 81 06/19/2018   AST 27 06/19/2018   ALT 28 06/19/2018   PROT 7.0 06/19/2018   ALBUMIN 3.5 06/19/2018   CALCIUM 9.4 06/19/2018   GFRAA >60 06/19/2018   QFTBGOLD Negative 03/10/2016   QFTBGOLDPLUS POSITIVE (A) 07/21/2018    Speciality Comments: Orencia IV every 4 weeks  Procedures:  No procedures performed Allergies: Penicillins and Sulfa antibiotics   Assessment / Plan:     Visit Diagnoses: No diagnosis found.   Orders: No orders of the defined types were placed in this encounter.  No orders of the defined types were placed in this encounter.   Face-to-face time spent with patient was *** minutes. Greater than 50% of time was spent in counseling and coordination of care.  Follow-Up Instructions: No follow-ups on file.   Earnestine Mealing, CMA  Note - This record has been created using Editor, commissioning.  Chart creation errors have been sought, but may not always  have been located. Such creation errors do not reflect on  the standard of medical care.

## 2018-08-03 ENCOUNTER — Ambulatory Visit: Payer: Medicare Other | Admitting: Internal Medicine

## 2018-08-07 ENCOUNTER — Ambulatory Visit: Payer: Medicare Other | Admitting: Rheumatology

## 2018-08-09 ENCOUNTER — Other Ambulatory Visit: Payer: Self-pay

## 2018-08-09 ENCOUNTER — Encounter: Payer: Self-pay | Admitting: Internal Medicine

## 2018-08-09 ENCOUNTER — Ambulatory Visit (INDEPENDENT_AMBULATORY_CARE_PROVIDER_SITE_OTHER): Payer: Medicare Other | Admitting: Internal Medicine

## 2018-08-09 DIAGNOSIS — Z227 Latent tuberculosis: Secondary | ICD-10-CM | POA: Insufficient documentation

## 2018-08-09 MED ORDER — RIFAPENTINE 150 MG PO TABS: 900.0000 mg | ORAL_TABLET | ORAL | 2 refills | Status: DC

## 2018-08-09 MED ORDER — ISONIAZID 300 MG PO TABS: 900.0000 mg | ORAL_TABLET | ORAL | 2 refills | Status: DC

## 2018-08-09 NOTE — Progress Notes (Signed)
HPI: Felicia Gonzalez is a 78 y.o. female who presents to the [facility] clinic today to see Dr. Megan Salon regarding her latent TB infection.  Patient Active Problem List   Diagnosis Date Noted  . Latent tuberculosis by blood test 08/09/2018  . Malignant neoplasm of lower-inner quadrant of left breast in female, estrogen receptor positive ([place]) 10/04/2016  . Osteoporosis 10/04/2016  . High risk medication use 06/08/2016  . Age-related osteoporosis without current pathological fracture 06/08/2016  . ANA positive 06/08/2016  . Moderate persistent asthma 06/08/2016  . Normocytic anemia 10/01/2014  . Rheumatoid arthritis of multiple sites without organ or system involvement with positive rheumatoid factor ([place]) 10/01/2014  . Subcapital fracture of left hip ([place]) 10/01/2014    Patient's Medications  New Prescriptions   ISONIAZID (NYDRAZID) 300 MG TABLET    Take 3 tablets (900 mg total) by mouth once a week for 12 doses.   RIFAPENTINE 150 MG TABS    Take 6 tablets (900 mg total) by mouth once a week.  Previous Medications   ACETAMINOPHEN (TYLENOL) 650 MG CR TABLET    Take 1,300 mg by mouth 2 (two) times daily as needed for pain. Take one hour prior to infusion    ALBUTEROL (PROVENTIL HFA;VENTOLIN HFA) 108 (90 BASE) MCG/ACT INHALER    Inhale 1-2 puffs into the lungs every 6 (six) hours as needed for wheezing or shortness of breath.   AMLODIPINE (NORVASC) 5 MG TABLET    Take 5 mg by mouth daily.    ASPIRIN EC 81 MG TABLET    Take 81 mg by mouth daily.   CALCIUM ACETATE (PHOSLO) 667 MG CAPSULE    Take by mouth 3 (three) times daily with meals.   CALCIUM GLUCONATE 500 MG TABLET    Take 2 tablets by mouth daily.   CHOLECALCIFEROL (VITAMIN D3) 5000 UNITS TABS    Take 5,000 Units by mouth daily after breakfast.   LEFLUNOMIDE (ARAVA) 20 MG TABLET    TAKE 1 TABLET BY MOUTH EVERY DAY   MULTIPLE VITAMIN (MULTIVITAMIN WITH MINERALS) TABS TABLET    Take 1 tablet by mouth daily.   OMEGA-3 FATTY ACIDS (OMEGA-3  FISH OIL) 1200 MG CAPS    Take 1,200 mg by mouth 2 (two) times daily.   ORENCIA 125 MG/ML SOSY    INJECT 125 MG INTO THE SKIN ONCE A WEEK.   POLYETHYL GLYCOL-PROPYL GLYCOL (SYSTANE OP)    Place 1 drop into the left eye at bedtime.   PREDNISONE (DELTASONE) 5 MG TABLET    Take 4 tabs by mouth x2 days, 3 tabs x2 days, 2 tabs x2 days, 1 tab x2 days.   RALOXIFENE (EVISTA) 60 MG TABLET    Take 1 tablet (60 mg total) by mouth daily.   SACCHAROMYCES BOULARDII (FLORASTOR) 250 MG CAPSULE    Take 250 mg by mouth 2 (two) times daily.   TRAVATAN Z 0.004 % SOLN OPHTHALMIC SOLUTION    Place 1 drop into the right eye at bedtime.   TURMERIC PO    Take 1 capsule by mouth daily after breakfast.   VITAMIN B-12 (CYANOCOBALAMIN) 500 MCG TABLET    Take 500 mcg by mouth daily.  Modified Medications   No medications on file  Discontinued Medications   PREDNISONE (DELTASONE) 5 MG TABLET    Take 4 tabs po x 4 days, 3  tabs po x 4 days, 2  tabs po x 4 days, 1  tab po x 4 days   PREDNISONE (  DELTASONE) 5 MG TABLET    Take 1 tablet (5 mg total) by mouth daily with breakfast.    Allergies: Allergies  Allergen Reactions  . Penicillins Swelling     Has patient had a PCN reaction causing immediate rash, facial/tongue/throat swelling, SOB or lightheadedness with hypotension:#  #  #  YES  #  #  #  Has patient had a PCN reaction causing severe rash involving mucus membranes or skin necrosis:  #  #  #  UNKNOWN  #  #  #  Has patient had a PCN reaction that required hospitalization No Has patient had a PCN reaction occurring within the last 10 years: No If all of the above answers are "NO", then may proceed with Cephalosporin use.   . Sulfa Antibiotics     UNSPECIFIED REACTION     Past Medical History: Past Medical History:  Diagnosis Date  . Anemia   . Arthritis    rheumatoid +CCP neg RF +ANA synovitis   . Asthma   . Cancer [facility])    Breast cancer  . Glaucoma    right eye  . Osteoporosis   . Personal history of  radiation therapy 09/2016    Social History: Social History   Socioeconomic History  . Marital status: Married    Spouse name: Not on file  . Number of children: Not on file  . Years of education: Not on file  . Highest education level: Not on file  Occupational History  . Not on file  Social Needs  . Financial resource strain: Not on file  . Food insecurity:    Worry: Not on file    Inability: Not on file  . Transportation needs:    Medical: Not on file    Non-medical: Not on file  Tobacco Use  . Smoking status: Never Smoker  . Smokeless tobacco: Never Used  Substance and Sexual Activity  . Alcohol use: No  . Drug use: Never  . Sexual activity: Not Currently  Lifestyle  . Physical activity:    Days per week: Not on file    Minutes per session: Not on file  . Stress: Not on file  Relationships  . Social connections:    Talks on phone: Not on file    Gets together: Not on file    Attends religious service: Not on file    Active member of club or organization: Not on file    Attends meetings of clubs or organizations: Not on file    Relationship status: Not on file  Other Topics Concern  . Not on file  Social History Narrative  . Not on file    Labs: No results found for: HIV1RNAQUANT, HIV1RNAVL, CD4TABS  RPR and STI No results found for: LABRPR, RPRTITER  No flowsheet data found.  Hepatitis B No results found for: HEPBSAB, HEPBSAG, HEPBCAB Hepatitis C No results found for: HEPCAB, HCVRNAPCRQN Hepatitis A No results found for: HAV Lipids: No results found for: CHOL, TRIG, HDL, CHOLHDL, VLDL, LDLCALC   Assessment: Felicia Gonzalez is here today to see Dr. Megan Salon to initiate treatment for her latent TB infection.  After discussion with the patient, Dr. Megan Salon will start her on isoniazid + rifapentine weekly x 12 weeks.  No drug-drug interactions with her other chronic medications. She will pick up medications and bring them to see me on Monday to discuss how to  take them and so I can counsel her appropriately.  Plan: - F/u with me on Monday  3/16 at 315pm  Emira Eubanks L. Yotam Rhine, PharmD, BCIDP, AAHIVP, [employer] for Infectious Disease 08/09/2018, 4:57 PM

## 2018-08-09 NOTE — Progress Notes (Signed)
[facility] for Infectious Disease  Reason for Consult: Latent tuberculosis by blood test Referring Provider: Dr. Bo Merino  Assessment: She needs treatment for presumed latent tuberculosis.  I reviewed the various treatment regimens with her and recommended a 12-week course of weekly isoniazid and rifapentine.  There are no significant drug drug interactions with her other medications.  I will have her return to see our pharmacist Cassie Kuppelweiser with her new medications to help her fill out a pillbox.  She will return to see me in 4 weeks.  There are no evidence-based guidelines to help guide the timing of reinstitution of TNF alpha inhibitors in patients meeting therapy for latent tuberculosis.  Conventional wisdom has been to wait 1 month after starting therapy but some experts now feel comfortable starting them concurrently.  She does not seem to be too uncomfortable at this time so I would favor holding Orencia for 3 to 4 weeks.  Plan: 1. Start isoniazid 900 mg weekly for 12 weeks 2. Start rifapentine 900 mg weekly for 12 weeks 3. Follow-up with Dr. Eber Hong next week and with me in 4 weeks  Patient Active Problem List   Diagnosis Date Noted  . Latent tuberculosis by blood test 08/09/2018    Priority: High  . Malignant neoplasm of lower-inner quadrant of left breast in female, estrogen receptor positive ([place]) 10/04/2016    Priority: Medium  . Rheumatoid arthritis of multiple sites without organ or system involvement with positive rheumatoid factor ([place]) 10/01/2014    Priority: Medium  . Osteoporosis 10/04/2016  . High risk medication use 06/08/2016  . Age-related osteoporosis without current pathological fracture 06/08/2016  . ANA positive 06/08/2016  . Moderate persistent asthma 06/08/2016  . Normocytic anemia 10/01/2014  . Subcapital fracture of left hip ([place]) 10/01/2014    Patient's Medications  New Prescriptions   ISONIAZID (NYDRAZID) 300 MG  TABLET    Take 3 tablets (900 mg total) by mouth once a week for 12 doses.   RIFAPENTINE 150 MG TABS    Take 6 tablets (900 mg total) by mouth once a week.  Previous Medications   ACETAMINOPHEN (TYLENOL) 650 MG CR TABLET    Take 1,300 mg by mouth 2 (two) times daily as needed for pain. Take one hour prior to infusion    ALBUTEROL (PROVENTIL HFA;VENTOLIN HFA) 108 (90 BASE) MCG/ACT INHALER    Inhale 1-2 puffs into the lungs every 6 (six) hours as needed for wheezing or shortness of breath.   AMLODIPINE (NORVASC) 5 MG TABLET    Take 5 mg by mouth daily.    ASPIRIN EC 81 MG TABLET    Take 81 mg by mouth daily.   CALCIUM ACETATE (PHOSLO) 667 MG CAPSULE    Take by mouth 3 (three) times daily with meals.   CALCIUM GLUCONATE 500 MG TABLET    Take 2 tablets by mouth daily.   CHOLECALCIFEROL (VITAMIN D3) 5000 UNITS TABS    Take 5,000 Units by mouth daily after breakfast.   LEFLUNOMIDE (ARAVA) 20 MG TABLET    TAKE 1 TABLET BY MOUTH EVERY DAY   MULTIPLE VITAMIN (MULTIVITAMIN WITH MINERALS) TABS TABLET    Take 1 tablet by mouth daily.   OMEGA-3 FATTY ACIDS (OMEGA-3 FISH OIL) 1200 MG CAPS    Take 1,200 mg by mouth 2 (two) times daily.   ORENCIA 125 MG/ML SOSY    INJECT 125 MG INTO THE SKIN ONCE A WEEK.   POLYETHYL GLYCOL-PROPYL GLYCOL (  SYSTANE OP)    Place 1 drop into the left eye at bedtime.   PREDNISONE (DELTASONE) 5 MG TABLET    Take 4 tabs by mouth x2 days, 3 tabs x2 days, 2 tabs x2 days, 1 tab x2 days.   RALOXIFENE (EVISTA) 60 MG TABLET    Take 1 tablet (60 mg total) by mouth daily.   SACCHAROMYCES BOULARDII (FLORASTOR) 250 MG CAPSULE    Take 250 mg by mouth 2 (two) times daily.   TRAVATAN Z 0.004 % SOLN OPHTHALMIC SOLUTION    Place 1 drop into the right eye at bedtime.   TURMERIC PO    Take 1 capsule by mouth daily after breakfast.   VITAMIN B-12 (CYANOCOBALAMIN) 500 MCG TABLET    Take 500 mcg by mouth daily.  Modified Medications   No medications on file  Discontinued Medications   PREDNISONE  (DELTASONE) 5 MG TABLET    Take 4 tabs po x 4 days, 3  tabs po x 4 days, 2  tabs po x 4 days, 1  tab po x 4 days   PREDNISONE (DELTASONE) 5 MG TABLET    Take 1 tablet (5 mg total) by mouth daily with breakfast.    HPI: Felicia Gonzalez is a 78 y.o. female with rheumatoid arthritis and previous left breast cancer.  She has been on [place] for her rheumatoid arthritis.  She has had multiple tests for latent tuberculosis over the past 15 years.  They had always been negative until last October when her QuantiFERON TB Gold assay was positive.  That was repeated last month and was positive again.  Chest x-ray showed no abnormalities.  She has no known contacts with anybody previously with active tuberculosis.  She was born in [place] but has not been back there for over a decade.  However friends from [place] have come to visit over the past year.  She is not bothered by any cough, new shortness of breath, fever, chills, sweats, change in appetite or loss of weight.  Her Orencia was held 1 month ago after her second test was positive again.  She recently started to have increased pain and stiffness in her hands and wrists and was started on low-dose prednisone.  Review of Systems: Review of Systems  Constitutional: Negative for chills, diaphoresis, fever and weight loss.  Gastrointestinal: Negative for abdominal pain, diarrhea, nausea and vomiting.  Musculoskeletal: Positive for joint pain.      Past Medical History:  Diagnosis Date  . Anemia   . Arthritis    rheumatoid +CCP neg RF +ANA synovitis   . Asthma   . Cancer [facility])    Breast cancer  . Glaucoma    right eye  . Osteoporosis   . Personal history of radiation therapy 09/2016    Social History   Tobacco Use  . Smoking status: Never Smoker  . Smokeless tobacco: Never Used  Substance Use Topics  . Alcohol use: No  . Drug use: Never    Family History  Problem Relation Age of Onset  . Hypertension Father    Allergies   Allergen Reactions  . Penicillins Swelling     Has patient had a PCN reaction causing immediate rash, facial/tongue/throat swelling, SOB or lightheadedness with hypotension:#  #  #  YES  #  #  #  Has patient had a PCN reaction causing severe rash involving mucus membranes or skin necrosis:  #  #  #  UNKNOWN  #  #  #  Has patient had a PCN reaction that required hospitalization No Has patient had a PCN reaction occurring within the last 10 years: No If all of the above answers are "NO", then may proceed with Cephalosporin use.   . Sulfa Antibiotics     UNSPECIFIED REACTION     OBJECTIVE: Vitals:   08/09/18 1503  BP: (!) 144/83  Pulse: (!) 105  Temp: 99.2 F (37.3 C)  Weight: 138 lb (62.6 kg)   Body mass index is 26.07 kg/m.   Physical Exam Constitutional:      Comments: She is very pleasant.  She is in no distress other than being slightly anxious.  HENT:     Mouth/Throat:     Pharynx: No oropharyngeal exudate.  Cardiovascular:     Rate and Rhythm: Normal rate and regular rhythm.     Heart sounds: No murmur.  Pulmonary:     Effort: Pulmonary effort is normal.     Breath sounds: Normal breath sounds. No rhonchi or rales.  Abdominal:     Palpations: Abdomen is soft.     Tenderness: There is no abdominal tenderness.  Musculoskeletal:     Comments: She has some swelling in her wrist and fingers bilaterally.  Psychiatric:        Mood and Affect: Mood normal.     Microbiology: No results found for this or any previous visit (from the past 240 hour(s)).  Michel Bickers, MD [facility] [phone] pager   [phone] cell 08/09/2018, 3:55 PM

## 2018-08-14 ENCOUNTER — Ambulatory Visit: Payer: Medicare Other | Admitting: Pharmacist

## 2018-08-14 ENCOUNTER — Telehealth: Payer: Self-pay | Admitting: Pharmacist

## 2018-08-14 NOTE — Telephone Encounter (Signed)
Patient was originally supposed to come in this afternoon and see me for medication review and counseling but called and wanted to do it over the phone and not get out due to COVID 19.   Patient was recently prescribed isoniazid 900 mg + rifapentine 900 mg weekly x 12 weeks for her latent TB infection. Discussed each medication with patient and how to take it.  Isoniazid must be taken on an empty stomach and rifapentine must be taken with food.  Explained this to patient and she states that she would take the 6 tablets of rifapentine with her lunch and then 2 hours later take the 3 isoniazid tablets. Encouraged her to continue the same schedule every week. Explained that she needs to separate isoniazid either one hour before or two hours after a meal. She will call me next Monday if she has issues remembering.    Also counseled on possible side effects such as nausea, vomiting, and diarrhea with the regimen especially on days she takes all of the medication.  Also cautioned on secretions turning orange/red while on rifapentine.  She states that she had already read about that in her drug information sheets she received from the pharmacy. She had some questions regarding her liver function and possible liver toxicity with the medications.  I discussed this with her and how this regimen is associated with a lower risk of liver toxicity and that we would be monitoring this throughout her 12 weeks. All other questions were answered and patient knows to call me with any in the future.

## 2018-08-14 NOTE — Telephone Encounter (Signed)
Much thanks.

## 2018-08-15 ENCOUNTER — Telehealth: Payer: Self-pay | Admitting: Rheumatology

## 2018-08-15 NOTE — Telephone Encounter (Signed)
Patient calling to discuss if she should keep appt for tomorrow, or reschedule. Please call to advise.

## 2018-08-15 NOTE — Telephone Encounter (Signed)
Patient advised she may keep her appointment for tomorrow or she may reschedule for another time. Patient has decided to reschedule her appointment. Patient has been rescheduled for 08/30/2018.

## 2018-08-16 ENCOUNTER — Ambulatory Visit: Payer: Medicare Other | Admitting: Rheumatology

## 2018-08-28 ENCOUNTER — Telehealth: Payer: Self-pay | Admitting: Rheumatology

## 2018-08-28 NOTE — Telephone Encounter (Signed)
Patient called requesting prescription refill of Prednisone to be sent to [facility] at [address] in [place].

## 2018-08-28 NOTE — Telephone Encounter (Signed)
Spoke with patient and advised her to contact the pharmacy as the prednisone prescription was sent to the pharmacy on 08/01/18 with an additional refill. Patient advised to call the office if she has any issues getting it refilled.

## 2018-08-30 ENCOUNTER — Other Ambulatory Visit: Payer: Self-pay | Admitting: Internal Medicine

## 2018-08-30 ENCOUNTER — Ambulatory Visit: Payer: Self-pay | Admitting: Rheumatology

## 2018-08-30 DIAGNOSIS — Z227 Latent tuberculosis: Secondary | ICD-10-CM

## 2018-09-04 ENCOUNTER — Telehealth: Payer: Self-pay | Admitting: Pharmacist

## 2018-09-04 NOTE — Telephone Encounter (Signed)
Patient called to ask if she needed to get a refill on her INH and rifapentine for her latent TB infection.  I explained to her that yes she needs to take it for a total of 12 weeks and to make sure she takes all 12 doses.  She will pick up her refill today. No issues with the medication right now.

## 2018-09-13 ENCOUNTER — Telehealth: Payer: Self-pay | Admitting: Behavioral Health

## 2018-09-13 NOTE — Telephone Encounter (Signed)
Patient called stating she has an upcoming appointment with Dr. Megan Salon 09/18/2018.  She states she has not been out of her home in 1 month due to Covid-19 pandemic and is wondering is she has to come to this appointment or can she come later date?  Informed her Dr Megan Salon would be made aware and will call her back with an answer.  Explained there is a possibility that she may need lab work but still wouldl check with Dr. Megan Salon. Pricilla Riffle RN

## 2018-09-13 NOTE — Telephone Encounter (Signed)
I can do an E visit with her on 09/18/2018.

## 2018-09-14 ENCOUNTER — Telehealth: Payer: Self-pay | Admitting: Hematology and Oncology

## 2018-09-14 NOTE — Telephone Encounter (Signed)
R/s appt per 4/16 sch message - per patient request.

## 2018-09-18 ENCOUNTER — Other Ambulatory Visit: Payer: Self-pay

## 2018-09-18 ENCOUNTER — Ambulatory Visit (INDEPENDENT_AMBULATORY_CARE_PROVIDER_SITE_OTHER): Payer: Medicare Other | Admitting: Internal Medicine

## 2018-09-18 ENCOUNTER — Ambulatory Visit: Payer: Medicare Other | Admitting: Hematology

## 2018-09-18 ENCOUNTER — Encounter: Payer: Self-pay | Admitting: Internal Medicine

## 2018-09-18 ENCOUNTER — Ambulatory Visit: Payer: Medicare Other

## 2018-09-18 ENCOUNTER — Other Ambulatory Visit: Payer: Medicare Other

## 2018-09-18 DIAGNOSIS — Z227 Latent tuberculosis: Secondary | ICD-10-CM | POA: Diagnosis not present

## 2018-09-18 NOTE — Progress Notes (Signed)
Virtual Visit via Telephone Note  I connected with Felicia Gonzalez on 09/18/18 at 11:15 AM EDT by telephone and verified that I am speaking with the correct person using two identifiers.   I discussed the limitations, risks, security and privacy concerns of performing an evaluation and management service by telephone and the availability of in person appointments. I also discussed with the patient that there may be a patient responsible charge related to this service. The patient expressed understanding and agreed to proceed.   History of Present Illness: I called and spoke with Felicia Gonzalez by phone today.  She has now completed 6 weeks of therapy for latent tuberculosis with isoniazid and rifapentine.  She has not had any problems tolerating her medication.  She has rheumatoid arthritis and left breast cancer.  She stopped her Orencia once her latent tuberculosis was diagnosed.  She was started on prednisone 5 mg daily.  She has not had any flare in her arthritis symptoms.  She has been very careful to shelter in place at home for the past month during the COVID-19 pandemic.   Observations/Objective:   Assessment and Plan: She is doing well and tolerating her isoniazid and rifapentine  Follow Up Instructions: Continue weekly isoniazid and rifapentine for 6 more weeks I will schedule a follow-up E-visit on 10/30/2018   I discussed the assessment and treatment plan with the patient. The patient was provided an opportunity to ask questions and all were answered. The patient agreed with the plan and demonstrated an understanding of the instructions.   The patient was advised to call back or seek an in-person evaluation if the symptoms worsen or if the condition fails to improve as anticipated.  I provided 14 minutes of non-face-to-face time during this encounter.   Michel Bickers, MD

## 2018-09-23 ENCOUNTER — Other Ambulatory Visit: Payer: Self-pay | Admitting: Rheumatology

## 2018-09-25 NOTE — Telephone Encounter (Signed)
Last Visit: 03/06/2018 Next Visit: 10/18/2018  Okay to refill per Dr. Estanislado Pandy.

## 2018-09-29 ENCOUNTER — Other Ambulatory Visit: Payer: Medicare Other

## 2018-09-29 NOTE — Progress Notes (Signed)
[name]  HEMATOLOGY ONCOLOGY CLINIC NOTE  Date of service: 10/02/18   Patient Care Team: Vernie Shanks, MD as PCP - General (Family Medicine)  Chief Complaint:  F/u for ER/PR positive Her neg Breast cancer  Diagnosis  Stage I (pT1b, pN0, Mx) left sided invasive ductal carcinoma of the breast. 1 cm in size with lymphovascular invasion grade 2/3, Ki-67 15% ER/PR positive HER-2/neu negative. One Sentinel lymph node biopsy negative No DCIS. Severe calcified benign lesions in left breast with PASH (pseudoangiomatous stromal hyperplasia).  Patient is status post left breast lumpectomy by Dr. Ninfa Linden on 07/09/2016.   S/p adjuvant  Left breast RT on 10/05/2016  INTERVAL HISTORY:  Ms Felicia Gonzalez is here for her scheduled follow-up for stage I left breast cancer. The patient's last visit with [place] was on 06/19/18. The pt reports that she is doing well overall.  In the interim, the pt has completed about 8 weeks of therapy for latent tuberculosis with Isoniazid and Rifapentine. She has been holding Orencia since this diagnosis and was started on '5mg'$  Prednisone daily.  The pt denies any fever or chills. She endorses good energy levels. She notes that she has been taking her Evista regularly and denies hot flashes or other concerns. The pt denies noticing any new breast changes or symptoms.  Lab results today (10/02/18) of CBC w/diff and CMP is as follows: all values are WNL except for RBC at 3.74, HGB at 10.5, HCT at 33.9, RDW at 16.2, Glucose at 110, Albumin at 3.4, Total Bilirubin at 0.2. 10/02/18 Ferritin is 226  On review of systems, pt reports good energy levels, stable weight, eating well, and denies fevers, chills, concerns for infections, breast changes, new breast symptoms, hot flashes, back pains, abdominal pains, and any other symptoms.   REVIEW OF SYSTEMS:    A 10+ POINT REVIEW OF SYSTEMS WAS OBTAINED including neurology, dermatology, psychiatry, cardiac, respiratory, lymph, extremities, GI,  GU, Musculoskeletal, constitutional, breasts, reproductive, HEENT.  All pertinent positives are noted in the HPI.  All others are negative.  . Past Medical History:  Diagnosis Date  . Anemia   . Arthritis    rheumatoid +CCP neg RF +ANA synovitis   . Asthma   . Cancer [facility])    Breast cancer  . Glaucoma    right eye  . Osteoporosis   . Personal history of radiation therapy 09/2016     Past Surgical History:  Procedure Laterality Date  . BREAST BIOPSY Left 05/14/2016   x2  . BREAST BIOPSY Right 05/14/2016  . BREAST LUMPECTOMY Left 07/09/2016  . BREAST LUMPECTOMY WITH RADIOACTIVE SEED AND SENTINEL LYMPH NODE BIOPSY Left 07/09/2016   Procedure: BREAST LUMPECTOMY WITH RADIOACTIVE SEED AND SENTINEL LYMPH NODE BIOPSY;  Surgeon: Coralie Keens, MD;  Location: [place];  Service: General;  Laterality: Left;  . NO PAST SURGERIES    . TOTAL HIP ARTHROPLASTY Left 10/01/2014   Procedure: TOTAL HIP ARTHROPLASTY;  Surgeon: Garald Balding, MD;  Location: [place];  Service: Orthopedics;  Laterality: Left;    Social History   Tobacco Use  . Smoking status: Never Smoker  . Smokeless tobacco: Never Used  Substance Use Topics  . Alcohol use: No  . Drug use: Never    ALLERGIES:  is allergic to penicillins and sulfa antibiotics.  MEDICATIONS:  Current Outpatient Medications  Medication Sig Dispense Refill  . acetaminophen (TYLENOL) 650 MG CR tablet Take 1,300 mg by mouth 2 (two) times daily as needed for pain. Take one hour  prior to infusion     . albuterol (PROVENTIL HFA;VENTOLIN HFA) 108 (90 Base) MCG/ACT inhaler Inhale 1-2 puffs into the lungs every 6 (six) hours as needed for wheezing or shortness of breath.    [name] amLODipine (NORVASC) 5 MG tablet Take 5 mg by mouth daily.     [name] aspirin EC 81 MG tablet Take 81 mg by mouth daily.    . calcium acetate (PHOSLO) 667 MG capsule Take by mouth 3 (three) times daily with meals.    . calcium gluconate 500 MG tablet Take 2 tablets by mouth daily.    .  Cholecalciferol (VITAMIN D3) 5000 units TABS Take 5,000 Units by mouth daily after breakfast.    . isoniazid (NYDRAZID) 300 MG tablet Take 3 tablets (900 mg total) by mouth once a week for 12 doses. Patients end date is 10/26/2018 36 tablet 0  . leflunomide (ARAVA) 20 MG tablet TAKE 1 TABLET BY MOUTH EVERY DAY 90 tablet 0  . Multiple Vitamin (MULTIVITAMIN WITH MINERALS) TABS tablet Take 1 tablet by mouth daily.    . Omega-3 Fatty Acids (OMEGA-3 FISH OIL) 1200 MG CAPS Take 1,200 mg by mouth 2 (two) times daily.    [name] ORENCIA 125 MG/ML SOSY INJECT 125 MG INTO THE SKIN ONCE A WEEK. 12 mL 0  . Polyethyl Glycol-Propyl Glycol (SYSTANE OP) Place 1 drop into the left eye at bedtime.    . predniSONE (DELTASONE) 5 MG tablet TAKE 1 TABLET BY MOUTH EVERY DAY WITH BREAKFAST 30 tablet 1  . raloxifene (EVISTA) 60 MG tablet Take 1 tablet (60 mg total) by mouth daily. 30 tablet 11  . Rifapentine 150 MG TABS Take 6 tablets (900 mg total) by mouth once a week. 24 each 2  . saccharomyces boulardii (FLORASTOR) 250 MG capsule Take 250 mg by mouth 2 (two) times daily.    . TRAVATAN Z 0.004 % SOLN ophthalmic solution Place 1 drop into the right eye at bedtime.    . TURMERIC PO Take 1 capsule by mouth daily after breakfast.    . vitamin B-12 (CYANOCOBALAMIN) 500 MCG tablet Take 500 mcg by mouth daily.     No current facility-administered medications for this visit.    Facility-Administered Medications Ordered in Other Visits  Medication Dose Route Frequency Provider Last Rate Last Dose  . denosumab (PROLIA) injection 60 mg  60 mg Subcutaneous Once [name], MD        PHYSICAL EXAMINATION: ECOG PERFORMANCE STATUS: 1 - Symptomatic but completely ambulatory  Vitals:   10/02/18 1506  BP: (!) 156/81  Pulse: (!) 105  Resp: 18  Temp: 99.4 F (37.4 C)  SpO2: 99%    Filed Weights   10/02/18 1506  Weight: 139 lb 9.6 oz (63.3 kg)   Body mass index is 26.38 kg/m.  GENERAL:alert, in no acute distress  and comfortable SKIN: no acute rashes, no significant lesions EYES: conjunctiva are pink and non-injected, sclera anicteric OROPHARYNX: MMM, no exudates, no oropharyngeal erythema or ulceration NECK: supple, no JVD LYMPH:  no palpable lymphadenopathy in the cervical, axillary or inguinal regions LUNGS: clear to auscultation b/l with normal respiratory effort HEART: regular rate & rhythm ABDOMEN:  normoactive bowel sounds , non tender, not distended. No palpable hepatosplenomegaly. Extremity: no pedal edema PSYCH: alert & oriented x 3 with fluent speech NEURO: no focal motor/sensory deficits    LABORATORY DATA:   I have reviewed the data as listed  . CBC Latest Ref Rng & Units 10/02/2018 06/19/2018  03/20/2018  WBC 4.0 - 10.5 K/uL 6.2 4.7 7.4  Hemoglobin 12.0 - 15.0 g/dL 10.5(L) 10.5(L) 11.0(L)  Hematocrit 36.0 - 46.0 % 33.9(L) 33.4(L) 35.0(L)  Platelets 150 - 400 K/uL 268 245 328   . CMP Latest Ref Rng & Units 10/02/2018 06/19/2018 03/20/2018  Glucose 70 - 99 mg/dL 110(H) 153(H) 130(H)  BUN 8 - 23 mg/dL _0 Creatinine 0.44 - 1.00 mg/dL 0.75 0.75 0.79  Sodium 135 - 145 mmol/L 141 138 138  Potassium 3.5 - 5.1 mmol/L 4.0 4.1 3.8  Chloride 98 - 111 mmol/L 103 102 100  CO2 22 - 32 mmol/L _1 Calcium 8.9 - 10.3 mg/dL 9.7 9.4 9.7  Total Protein 6.5 - 8.1 g/dL 7.4 7.0 7.2  Total Bilirubin 0.3 - 1.2 mg/dL 0.2(L) 0.3 0.3  Alkaline Phos 38 - 126 U/L 87 81 86  AST 15 - 41 U/L 32 27 28  ALT 0 - 44 U/L 40 _2 OH vit D - 54.7---> 49.2  . Lab Results  Component Value Date   IRON 81 04/18/2017   TIBC 313 04/18/2017   IRONPCTSAT 26 04/18/2017   (Iron and TIBC)  Lab Results  Component Value Date   FERRITIN 163 06/19/2018   [id number] [id number]        Microscopic Comment 1. BREAST, INVASIVE TUMOR Procedure: Seed localized lumpectomy. Laterality: Left breast. Tumor Size (gross measurement): 1.0 cm Histologic Type: Ductal Grade: II Tubular Differentiation: 3 Nuclear  Pleomorphism: 2 Mitotic Count: 1 Ductal Carcinoma in Situ (DCIS): Not identified Extent of Tumor: Confined to breast parenchyma Margins: Invasive carcinoma, distance from closest margin: Focally 0.1 cm to the anterior margin Regional Lymph Nodes: Number of Lymph Nodes Examined: 1 Number of Sentinel Lymph Nodes Examined: 1 Lymph Nodes with Macrometastases: 0 Lymph Nodes with Micrometastases: 0 Lymph Nodes with Isolated Tumor Cells: 0 Breast Prognostic Profile: [id number] Estrogen Receptor: 100%, strong Progesterone Receptor: 5%, strong Her2: No amplification was detected. The ratio was 1.27 1 of 3 FINAL for CAYCI, MCNABB B [id number]) Microscopic Comment(continued) Ki-67: 15% Pathologic Stage Classification (pTNM, AJCC 8th Edition): Primary Tumor (pT): pT1b Regional Lymph Nodes (pN): pN0 (JBK:ecj 07/12/2016) Enid Cutter MD Pathologist, Electronic Signature (Case signed 07/12/2016)     CLINICAL DATA:  Followup for probably benign right breast mass and probably benign left breast calcifications.  EXAM: 2D DIGITAL DIAGNOSTIC BILATERAL MAMMOGRAM WITH CAD AND ADJUNCT TOMO  BILATERAL BREAST ULTRASOUND  COMPARISON:  Previous exam(s).  ACR Breast Density Category c: The breast tissue is heterogeneously dense, which may obscure small masses.  FINDINGS: No suspicious masses or calcifications are seen in the right breast. The oval circumscribed mass in the upper-outer posterior right breast appears unchanged. Spot compression magnification views were performed over the inner left breast demonstrating unchanged 3 mm group of punctate calcifications. There is a mass with irregular margins in the lower inner left breast measuring approximately 7-8 mm.  Mammographic images were processed with CAD.  Physical examination of the left breast reveals a firm nodule at the 8 o'clock position.  Targeted ultrasound of the left breast was performed demonstrating an  irregular hypoechoic mass at 8 o'clock 2 cm from nipple measuring 0.8 x 0.6 x 0.7 cm. This corresponds well with the mass seen at mammography. No lymphadenopathy seen in the left axilla.  Targeted ultrasound of the right breast was performed demonstrating a mixed echogenicity mass at 10 o'clock 8 cm from nipple measuring 1.4 x 0.6 x 1.1  cm. Although this may represent an area of fat necrosis is indeterminate. No lymphadenopathy seen in the right axilla.  IMPRESSION: 1. Suspicious mass in the left breast at the 8 o'clock position.  2. Stable appearance of punctate calcifications in the lower inner left breast.  3. Indeterminate mass in the right breast at 10 o'clock 8 cm from the nipple which cannot be clearly characterized as an area of fat necrosis.  RECOMMENDATION: 1. Recommend ultrasound-guided biopsy of the mass in the left breast at the 8 o'clock position.  2. Recommend stereotactic guided biopsy of the calcifications in the lower inner left breast (given the suspicious mass seen in close proximity to the calcifications).  3. Recommend ultrasound-guided biopsy of the mass in the right breast at 10 o'clock position.  Biopsies are being scheduled for the patient.  I have discussed the findings and recommendations with the patient. Results were also provided in writing at the conclusion of the visit. If applicable, a reminder letter will be sent to the patient regarding the next appointment.  BI-RADS CATEGORY  4: Suspicious.   Electronically Signed   By: Everlean Alstrom M.D.   On: 05/12/2016 13:56   RADIOGRAPHIC STUDIES: I have personally reviewed the radiological images as listed and agreed with the findings in the report. No results found.  ASSESSMENT & PLAN:   78 y.o.  female with  #1 Stage I (pT1b, pN0, Mx) left sided invasive ductal carcinoma of the breast. 1 cm in size with lymphovascular invasion grade 2/3, Ki-67 15% ER/PR positive  HER-2/neu negative. One Sentinel lymph node biopsy negative No DCIS. Severe calcified benign lesions in left breast with PASH (pseudoangiomatous stromal hyperplasia).  Patient had left breast lumpectomy by Dr. Ninfa Linden on 07/09/2016.  She completed her adjuvant radiation therapy on 09/2016  Has been on Raloxifene since then for adjuvant endocrine therapy. (AI not considered due to severe osteoporosis with fractures)  05/26/18 MM revealed No mammographic evidence of malignancy in either breast. 2. Stable left breast posttreatment changes.  #2 Severe osteoporosis - multiple risk factors including chronic steroid therapy, age, rheumatoid arthritis. Her bone density study shows significant osteoporosis despite being on Fosamax for 2 years. Last vitamin D levels are adequate at 50 She has had an osteoporotic left hip fracture around May 2016.  #3 Rheumatoid arthritis was on Orencia per rheumatology - currently off due to LTBI status Plan -Continue follow-up with Dr. Estanislado Pandy from rheumatology for ongoing treatments.   #4 Anemia of chronic disease due to chronic inflammation from RA  Plan -no indication for additional IV Iron at this time -continue to optimize RA management.  #5 New +ve TB quantiferon test - currently on treatment for LTBI  PLAN:  -Discussed pt labwork today, 10/02/18; blood counts and chemistries are stable. No liver or kidney abnormalities. -10/02/18 Ferritin is pending -Last Vitamin D 25 hydroxy at 52.1 on 06/19/18 -Suspect that mild anemia is related to inflammation from RA. No current indication for blood transfusion. -The pt shows no clinical or lab progression/return of her breast cancer at this time. -Continue 66m Evista daily -Continue Prolia every 6 months -Continue 860maspirin and 5000 units Vitamin D replacement  -Proceed with bone density study on 11/27/18 -Recommend that the pt take Vitamin B6 to reduce risk or neuropathy related to Isoniazid, or take a  Vitamin B complex -Will see the pt back in 6 months   RTC with Dr KaIrene Limboith labs and Prolia injection in 6 months   The total time  spent in the appt was 25 minutes and more than 50% was on counseling and direct patient cares.    Sullivan Lone MD [place] AAHIVMS [facility] Hematology/Oncology Physician [facility]  (Office):       [phone] (Work cell):  [phone] (Fax):           [phone]  I, Baldwin [place], am acting as a scribe for Dr. Sullivan Lone.   .I have reviewed the above documentation for accuracy and completeness, and I agree with the above. [name] MD

## 2018-10-02 ENCOUNTER — Telehealth: Payer: Self-pay | Admitting: Hematology

## 2018-10-02 ENCOUNTER — Other Ambulatory Visit: Payer: Self-pay

## 2018-10-02 ENCOUNTER — Inpatient Hospital Stay: Payer: Medicare Other

## 2018-10-02 ENCOUNTER — Inpatient Hospital Stay: Payer: Medicare Other | Attending: Hematology

## 2018-10-02 ENCOUNTER — Inpatient Hospital Stay (HOSPITAL_BASED_OUTPATIENT_CLINIC_OR_DEPARTMENT_OTHER): Payer: Medicare Other | Admitting: Hematology

## 2018-10-02 VITALS — BP 156/81 | HR 105 | Temp 99.4°F | Resp 18 | Ht 61.0 in | Wt 139.6 lb

## 2018-10-02 DIAGNOSIS — Z79899 Other long term (current) drug therapy: Secondary | ICD-10-CM

## 2018-10-02 DIAGNOSIS — Z170 Estrogen receptor positive status [ER+]: Secondary | ICD-10-CM

## 2018-10-02 DIAGNOSIS — C50212 Malignant neoplasm of upper-inner quadrant of left female breast: Secondary | ICD-10-CM

## 2018-10-02 DIAGNOSIS — D638 Anemia in other chronic diseases classified elsewhere: Secondary | ICD-10-CM | POA: Diagnosis not present

## 2018-10-02 DIAGNOSIS — M069 Rheumatoid arthritis, unspecified: Secondary | ICD-10-CM | POA: Insufficient documentation

## 2018-10-02 DIAGNOSIS — M810 Age-related osteoporosis without current pathological fracture: Secondary | ICD-10-CM

## 2018-10-02 DIAGNOSIS — C50312 Malignant neoplasm of lower-inner quadrant of left female breast: Secondary | ICD-10-CM

## 2018-10-02 DIAGNOSIS — Z923 Personal history of irradiation: Secondary | ICD-10-CM | POA: Diagnosis not present

## 2018-10-02 DIAGNOSIS — Z7982 Long term (current) use of aspirin: Secondary | ICD-10-CM | POA: Diagnosis not present

## 2018-10-02 DIAGNOSIS — D509 Iron deficiency anemia, unspecified: Secondary | ICD-10-CM

## 2018-10-02 LAB — CBC WITH DIFFERENTIAL/PLATELET
Abs Immature Granulocytes: 0.02 10*3/uL (ref 0.00–0.07)
Basophils Absolute: 0.1 10*3/uL (ref 0.0–0.1)
Basophils Relative: 1 %
Eosinophils Absolute: 0.1 10*3/uL (ref 0.0–0.5)
Eosinophils Relative: 1 %
HCT: 33.9 % — ABNORMAL LOW (ref 36.0–46.0)
Hemoglobin: 10.5 g/dL — ABNORMAL LOW (ref 12.0–15.0)
Immature Granulocytes: 0 %
Lymphocytes Relative: 17 %
Lymphs Abs: 1 10*3/uL (ref 0.7–4.0)
MCH: 28.1 pg (ref 26.0–34.0)
MCHC: 31 g/dL (ref 30.0–36.0)
MCV: 90.6 fL (ref 80.0–100.0)
Monocytes Absolute: 0.5 10*3/uL (ref 0.1–1.0)
Monocytes Relative: 8 %
Neutro Abs: 4.5 10*3/uL (ref 1.7–7.7)
Neutrophils Relative %: 73 %
Platelets: 268 10*3/uL (ref 150–400)
RBC: 3.74 MIL/uL — ABNORMAL LOW (ref 3.87–5.11)
RDW: 16.2 % — ABNORMAL HIGH (ref 11.5–15.5)
WBC: 6.2 10*3/uL (ref 4.0–10.5)
nRBC: 0 % (ref 0.0–0.2)

## 2018-10-02 LAB — CMP (CANCER CENTER ONLY)
ALT: 40 U/L (ref 0–44)
AST: 32 U/L (ref 15–41)
Albumin: 3.4 g/dL — ABNORMAL LOW (ref 3.5–5.0)
Alkaline Phosphatase: 87 U/L (ref 38–126)
Anion gap: 11 (ref 5–15)
BUN: 9 mg/dL (ref 8–23)
CO2: 27 mmol/L (ref 22–32)
Calcium: 9.7 mg/dL (ref 8.9–10.3)
Chloride: 103 mmol/L (ref 98–111)
Creatinine: 0.75 mg/dL (ref 0.44–1.00)
GFR, Est AFR Am: >60 mL/min (ref >60)
GFR, Estimated: >60 mL/min (ref >60)
Glucose, Bld: 110 mg/dL — ABNORMAL HIGH (ref 70–99)
Potassium: 4 mmol/L (ref 3.5–5.1)
Sodium: 141 mmol/L (ref 135–145)
Total Bilirubin: 0.2 mg/dL — ABNORMAL LOW (ref 0.3–1.2)
Total Protein: 7.4 g/dL (ref 6.5–8.1)

## 2018-10-02 LAB — FERRITIN: Ferritin: 226 ng/mL (ref 11–307)

## 2018-10-02 MED ORDER — DENOSUMAB 60 MG/ML ~~LOC~~ SOLN
60.0000 mg | Freq: Once | SUBCUTANEOUS | Status: AC
  Administered 2018-10-02: 60 mg via SUBCUTANEOUS

## 2018-10-02 NOTE — Telephone Encounter (Signed)
Scheduled appt per 5/4 los.  A calendar will be mailed out.

## 2018-10-03 ENCOUNTER — Other Ambulatory Visit: Payer: Self-pay | Admitting: Rheumatology

## 2018-10-03 NOTE — Telephone Encounter (Signed)
Last Visit: 03/06/2018 Next Visit: 10/18/2018 Labs: 10/02/2018 stable   Okay to refill per Dr. Estanislado Pandy.

## 2018-10-04 ENCOUNTER — Telehealth: Payer: Self-pay | Admitting: *Deleted

## 2018-10-04 NOTE — Telephone Encounter (Signed)
Patient called, confused as she is out of rifapentine but still has isoniazid.  She understands to take these together, will go to [facility] to pick up the rifapentine. These are ready for pick up at [facility].  Her last dose of both medications will be 6/1, the same day she follows up with Dr Megan Salon.  She had labs draws by Dr Irene Limbo this week, would like Dr Megan Salon to be aware.  Landis Gandy, RN

## 2018-10-11 NOTE — Progress Notes (Deleted)
Office Visit Note  Patient: Felicia Gonzalez             Date of Birth: 1941-01-13           MRN: [id number]             PCP: Vernie Shanks, MD Referring: Vernie Shanks, MD Visit Date: 10/18/2018 Occupation: @GUAROCC @  Subjective:  No chief complaint on file.   History of Present Illness: Felicia Gonzalez is a 78 y.o. female ***   Activities of Daily Living:  Patient reports morning stiffness for *** {minute/hour:19697}.   Patient {ACTIONS;DENIES/REPORTS:21021675::"Denies"} nocturnal pain.  Difficulty dressing/grooming: {ACTIONS;DENIES/REPORTS:21021675::"Denies"} Difficulty climbing stairs: {ACTIONS;DENIES/REPORTS:21021675::"Denies"} Difficulty getting out of chair: {ACTIONS;DENIES/REPORTS:21021675::"Denies"} Difficulty using hands for taps, buttons, cutlery, and/or writing: {ACTIONS;DENIES/REPORTS:21021675::"Denies"}  No Rheumatology ROS completed.   PMFS History:  Patient Active Problem List   Diagnosis Date Noted  . Latent tuberculosis by blood test 08/09/2018  . Malignant neoplasm of lower-inner quadrant of left breast in female, estrogen receptor positive ([place]) 10/04/2016  . Osteoporosis 10/04/2016  . High risk medication use 06/08/2016  . Age-related osteoporosis without current pathological fracture 06/08/2016  . ANA positive 06/08/2016  . Moderate persistent asthma 06/08/2016  . Normocytic anemia 10/01/2014  . Rheumatoid arthritis of multiple sites without organ or system involvement with positive rheumatoid factor ([place]) 10/01/2014  . Subcapital fracture of left hip ([place]) 10/01/2014    Past Medical History:  Diagnosis Date  . Anemia   . Arthritis    rheumatoid +CCP neg RF +ANA synovitis   . Asthma   . Cancer [facility])    Breast cancer  . Glaucoma    right eye  . Osteoporosis   . Personal history of radiation therapy 09/2016    Family History  Problem Relation Age of Onset  . Hypertension Father    Past Surgical History:  Procedure Laterality Date  .  BREAST BIOPSY Left 05/14/2016   x2  . BREAST BIOPSY Right 05/14/2016  . BREAST LUMPECTOMY Left 07/09/2016  . BREAST LUMPECTOMY WITH RADIOACTIVE SEED AND SENTINEL LYMPH NODE BIOPSY Left 07/09/2016   Procedure: BREAST LUMPECTOMY WITH RADIOACTIVE SEED AND SENTINEL LYMPH NODE BIOPSY;  Surgeon: Coralie Keens, MD;  Location: [place];  Service: General;  Laterality: Left;  . NO PAST SURGERIES    . TOTAL HIP ARTHROPLASTY Left 10/01/2014   Procedure: TOTAL HIP ARTHROPLASTY;  Surgeon: Garald Balding, MD;  Location: [place];  Service: Orthopedics;  Laterality: Left;   Social History   Social History Narrative  . Not on file    There is no immunization history on file for this patient.   Objective: Vital Signs: There were no vitals taken for this visit.   Physical Exam   Musculoskeletal Exam: ***  CDAI Exam: CDAI Score: Not documented Patient Global Assessment: Not documented; Provider Global Assessment: Not documented Swollen: Not documented; Tender: Not documented Joint Exam   Not documented   There is currently no information documented on the homunculus. Go to the Rheumatology activity and complete the homunculus joint exam.  Investigation: No additional findings.  Imaging: No results found.  Recent Labs: Lab Results  Component Value Date   WBC 6.2 10/02/2018   HGB 10.5 (L) 10/02/2018   PLT 268 10/02/2018   NA 141 10/02/2018   K 4.0 10/02/2018   CL 103 10/02/2018   CO2 27 10/02/2018   GLUCOSE 110 (H) 10/02/2018   BUN 9 10/02/2018   CREATININE 0.75 10/02/2018   BILITOT 0.2 (L) 10/02/2018  ALKPHOS 87 10/02/2018   AST 32 10/02/2018   ALT 40 10/02/2018   PROT 7.4 10/02/2018   ALBUMIN 3.4 (L) 10/02/2018   CALCIUM 9.7 10/02/2018   GFRAA >60 10/02/2018   QFTBGOLD Negative 03/10/2016   QFTBGOLDPLUS POSITIVE (A) 07/21/2018    Speciality Comments: Orencia IV every 4 weeks  Procedures:  No procedures performed Allergies: Penicillins and Sulfa antibiotics    Assessment / Plan:     Visit Diagnoses: No diagnosis found.   Orders: No orders of the defined types were placed in this encounter.  No orders of the defined types were placed in this encounter.   Face-to-face time spent with patient was *** minutes. Greater than 50% of time was spent in counseling and coordination of care.  Follow-Up Instructions: No follow-ups on file.   Ofilia Neas, PA-C  Note - This record has been created using Dragon software.  Chart creation errors have been sought, but may not always  have been located. Such creation errors do not reflect on  the standard of medical care.

## 2018-10-17 NOTE — Progress Notes (Signed)
Office Visit Note  Patient: Felicia Gonzalez             Date of Birth: [birth year]           MRN: [id number]             PCP: Vernie Shanks, MD Referring: Vernie Shanks, MD Visit Date: 10/24/2018 Occupation: '@GUAROCC'$ @  Subjective:  Increased joint pain.    History of Present Illness: Felicia Gonzalez is a 78 y.o. female with history of seropositive rheumatoid arthritis.  She was taken off Orencia in February after her TB Gold became positive on July 21, 2018.  She has been off Orencia since then.  She has been taking Arava through this time.  She is on 5 mg p.o. daily for almost a month now.  She has been on INH and rifampin combination by ID.  She will be finishing her course next week.  She is having a flare currently with pain in multiple joints.  Activities of Daily Living:  Patient reports morning stiffness for all day hours.   Patient Reports nocturnal pain.  Difficulty dressing/grooming: Reports Difficulty climbing stairs: Reports Difficulty getting out of chair: Denies Difficulty using hands for taps, buttons, cutlery, and/or writing: Reports  Review of Systems  Constitutional: Positive for fatigue. Negative for night sweats, weight gain and weight loss.  HENT: Negative for mouth sores, trouble swallowing, trouble swallowing, mouth dryness and nose dryness.   Eyes: Positive for dryness. Negative for pain, redness and visual disturbance.  Respiratory: Negative for cough, shortness of breath and difficulty breathing.   Cardiovascular: Negative for chest pain, palpitations, hypertension, irregular heartbeat and swelling in legs/feet.  Gastrointestinal: Negative for blood in stool, constipation and diarrhea.  Endocrine: Negative for increased urination.  Genitourinary: Negative for vaginal dryness.  Musculoskeletal: Positive for arthralgias, joint pain, joint swelling and morning stiffness. Negative for myalgias, muscle weakness, muscle tenderness and myalgias.  Skin:  Negative for color change, rash, hair loss, skin tightness, ulcers and sensitivity to sunlight.  Allergic/Immunologic: Negative for susceptible to infections.  Neurological: Negative for dizziness, memory loss, night sweats and weakness.  Hematological: Negative for swollen glands.  Psychiatric/Behavioral: Negative for depressed mood and sleep disturbance. The patient is not nervous/anxious.     PMFS History:  Patient Active Problem List   Diagnosis Date Noted  . Latent tuberculosis by blood test 08/09/2018  . Malignant neoplasm of lower-inner quadrant of left breast in female, estrogen receptor positive ([place]) 10/04/2016  . Osteoporosis 10/04/2016  . High risk medication use 06/08/2016  . Age-related osteoporosis without current pathological fracture 06/08/2016  . ANA positive 06/08/2016  . Moderate persistent asthma 06/08/2016  . Normocytic anemia 10/01/2014  . Rheumatoid arthritis of multiple sites without organ or system involvement with positive rheumatoid factor ([place]) 10/01/2014  . Subcapital fracture of left hip ([place]) 10/01/2014    Past Medical History:  Diagnosis Date  . Anemia   . Arthritis    rheumatoid +CCP neg RF +ANA synovitis   . Asthma   . Cancer [facility])    Breast cancer  . Glaucoma    right eye  . Osteoporosis   . Personal history of radiation therapy 09/2016    Family History  Problem Relation Age of Onset  . Hypertension Father    Past Surgical History:  Procedure Laterality Date  . BREAST BIOPSY Left 05/14/2016   x2  . BREAST BIOPSY Right 05/14/2016  . BREAST LUMPECTOMY Left 07/09/2016  . BREAST LUMPECTOMY WITH RADIOACTIVE  SEED AND SENTINEL LYMPH NODE BIOPSY Left 07/09/2016   Procedure: BREAST LUMPECTOMY WITH RADIOACTIVE SEED AND SENTINEL LYMPH NODE BIOPSY;  Surgeon: Coralie Keens, MD;  Location: [facility];  Service: General;  Laterality: Left;  . NO PAST SURGERIES    . TOTAL HIP ARTHROPLASTY Left 10/01/2014   Procedure: TOTAL HIP ARTHROPLASTY;  Surgeon:  Garald Balding, MD;  Location: [place];  Service: Orthopedics;  Laterality: Left;   Social History   Social History Narrative  . Not on file    There is no immunization history on file for this patient.   Objective: Vital Signs: BP (!) 146/87 (BP Location: Left Arm, Patient Position: Sitting, Cuff Size: Normal)   Pulse 100   Resp 13   Ht '5\' 1"'$  (1.549 m)   Wt 140 lb (63.5 kg)   BMI 26.45 kg/m    Physical Exam Vitals signs and nursing note reviewed.  Constitutional:      Appearance: She is well-developed.  HENT:     Head: Normocephalic and atraumatic.  Eyes:     Conjunctiva/sclera: Conjunctivae normal.  Neck:     Musculoskeletal: Normal range of motion.  Cardiovascular:     Rate and Rhythm: Normal rate and regular rhythm.     Heart sounds: Normal heart sounds.  Pulmonary:     Effort: Pulmonary effort is normal.     Breath sounds: Normal breath sounds.  Abdominal:     General: Bowel sounds are normal.     Palpations: Abdomen is soft.  Lymphadenopathy:     Cervical: No cervical adenopathy.  Skin:    General: Skin is warm and dry.     Capillary Refill: Capillary refill takes less than 2 seconds.  Neurological:     Mental Status: She is alert and oriented to person, place, and time.  Psychiatric:        Behavior: Behavior normal.      Musculoskeletal Exam: C-spine was in good range of motion.  She had discomfort range of motion of bilateral shoulders.  She had some swelling in her left elbow joint and right wrist joint.  Synovitis in MCPs as described below.  She had warmth and swelling in her bilateral knee joints.  CDAI Exam: CDAI Score: 15.2  Patient Global Assessment: 5 (mm); Provider Global Assessment: 7 (mm) Swollen: 6 ; Tender: 8  Joint Exam      Right  Left  Glenohumeral   Tender   Tender  Elbow     Swollen Tender  Wrist  Swollen Tender     MCP 4  Swollen Tender     MCP 5  Swollen Tender     Knee  Swollen Tender  Swollen Tender     Investigation:  No additional findings.  Imaging: No results found.  Recent Labs: Lab Results  Component Value Date   WBC 6.2 10/02/2018   HGB 10.5 (L) 10/02/2018   PLT 268 10/02/2018   NA 141 10/02/2018   K 4.0 10/02/2018   CL 103 10/02/2018   CO2 27 10/02/2018   GLUCOSE 110 (H) 10/02/2018   BUN 9 10/02/2018   CREATININE 0.75 10/02/2018   BILITOT 0.2 (L) 10/02/2018   ALKPHOS 87 10/02/2018   AST 32 10/02/2018   ALT 40 10/02/2018   PROT 7.4 10/02/2018   ALBUMIN 3.4 (L) 10/02/2018   CALCIUM 9.7 10/02/2018   GFRAA >60 10/02/2018   QFTBGOLD Negative 03/10/2016   QFTBGOLDPLUS POSITIVE (A) 07/21/2018    Speciality Comments: Orencia IV every 4 weeks  Procedures:  No procedures performed Allergies: Penicillins and Sulfa antibiotics   Assessment / Plan:     Visit Diagnoses: Rheumatoid arthritis of multiple sites without organ or system involvement with positive rheumatoid factor (HCC) - Positive RF, positive anti-CCP, positive ANA, elevated ESR.  Patient has been off Ward for 3 months now.  She has been taking Arava.  She is having a flare with pain and swelling in multiple joints as described above.  High risk medication use - She is on Arava 20 mg daily.  Most recent CBC/CMP within normal limits except for anemia on 10/02/2018.  Maureen Chatters is on hold.  I have advised her to resume Orencia.  We will check labs in a month and then every 3 months.  I have also advised her to increase prednisone to 10 mg p.o. daily for few days until her swelling goes down and then she can go back to 5 mg p.o. daily dosing.  Positive QuantiFERON-TB Gold test-patient received INH and rifampin through ID.  She will be finishing the course next week.  ANA positive  Age-related osteoporosis without current pathological fracture-she is on Prolia by her oncologist.  Malignant neoplasm of upper-inner quadrant of left breast in female, estrogen receptor positive (HCC)-followed up by oncology.  History of asthma   History of anemia -chronic.  Orders: No orders of the defined types were placed in this encounter.  Meds ordered this encounter  Medications  . Abatacept (ORENCIA) 125 MG/ML SOSY    Sig: INJECT 125 MG INTO THE SKIN ONCE A WEEK.    Dispense:  12 mL    Refill:  0    Face-to-face time spent with patient was 35 minutes. Greater than 50% of time was spent in counseling and coordination of care.  Follow-Up Instructions: Return in about 3 months (around 01/24/2019) for Rheumatoid arthritis.   Bo Merino, MD  Note - This record has been created using Editor, commissioning.  Chart creation errors have been sought, but may not always  have been located. Such creation errors do not reflect on  the standard of medical care.

## 2018-10-18 ENCOUNTER — Ambulatory Visit: Payer: Self-pay | Admitting: Rheumatology

## 2018-10-24 ENCOUNTER — Other Ambulatory Visit: Payer: Self-pay | Admitting: Rheumatology

## 2018-10-24 ENCOUNTER — Other Ambulatory Visit: Payer: Self-pay

## 2018-10-24 ENCOUNTER — Ambulatory Visit (INDEPENDENT_AMBULATORY_CARE_PROVIDER_SITE_OTHER): Payer: Medicare Other | Admitting: Rheumatology

## 2018-10-24 ENCOUNTER — Encounter: Payer: Self-pay | Admitting: Rheumatology

## 2018-10-24 VITALS — BP 146/87 | HR 100 | Resp 13 | Ht 61.0 in | Wt 140.0 lb

## 2018-10-24 DIAGNOSIS — R768 Other specified abnormal immunological findings in serum: Secondary | ICD-10-CM | POA: Diagnosis not present

## 2018-10-24 DIAGNOSIS — M0579 Rheumatoid arthritis with rheumatoid factor of multiple sites without organ or systems involvement: Secondary | ICD-10-CM | POA: Diagnosis not present

## 2018-10-24 DIAGNOSIS — C50212 Malignant neoplasm of upper-inner quadrant of left female breast: Secondary | ICD-10-CM

## 2018-10-24 DIAGNOSIS — Z862 Personal history of diseases of the blood and blood-forming organs and certain disorders involving the immune mechanism: Secondary | ICD-10-CM

## 2018-10-24 DIAGNOSIS — Z170 Estrogen receptor positive status [ER+]: Secondary | ICD-10-CM

## 2018-10-24 DIAGNOSIS — R7612 Nonspecific reaction to cell mediated immunity measurement of gamma interferon antigen response without active tuberculosis: Secondary | ICD-10-CM | POA: Diagnosis not present

## 2018-10-24 DIAGNOSIS — M810 Age-related osteoporosis without current pathological fracture: Secondary | ICD-10-CM

## 2018-10-24 DIAGNOSIS — Z79899 Other long term (current) drug therapy: Secondary | ICD-10-CM | POA: Diagnosis not present

## 2018-10-24 DIAGNOSIS — Z8709 Personal history of other diseases of the respiratory system: Secondary | ICD-10-CM

## 2018-10-24 MED ORDER — ABATACEPT 125 MG/ML ~~LOC~~ SOSY: PREFILLED_SYRINGE | SUBCUTANEOUS | 0 refills | Status: DC

## 2018-10-24 NOTE — Patient Instructions (Signed)
Standing Labs We placed an order today for your standing lab work.    Please come back and get your standing labs in one month and then every 3 months  We have open lab daily Monday through Thursday from 8:30-12:30 PM and 1:30-4:30 PM and Friday from 8:30-12:30 PM and 1:30 -4:00 PM at the office of Dr. Bo Merino.   You may experience shorter wait times on Monday and Friday afternoons. The office is located at [address], [place], [place], [place] [address] No appointment is necessary.   Labs are drawn by [facility].  You may receive a bill from [facility] for your lab work.  If you wish to have your labs drawn at another location, please call the office 24 hours in advance to send orders.  If you have any questions regarding directions or hours of operation,  please call [phone].   Just as a reminder please drink plenty of water prior to coming for your lab work. Thanks!

## 2018-10-30 ENCOUNTER — Encounter: Payer: Self-pay | Admitting: Internal Medicine

## 2018-10-30 ENCOUNTER — Other Ambulatory Visit: Payer: Self-pay

## 2018-10-30 ENCOUNTER — Ambulatory Visit (INDEPENDENT_AMBULATORY_CARE_PROVIDER_SITE_OTHER): Payer: Medicare Other | Admitting: Internal Medicine

## 2018-10-30 DIAGNOSIS — Z227 Latent tuberculosis: Secondary | ICD-10-CM

## 2018-10-30 MED ORDER — RIFAPENTINE 150 MG PO TABS: 900.0000 mg | ORAL_TABLET | ORAL | 2 refills | Status: AC

## 2018-10-30 MED ORDER — ISONIAZID 300 MG PO TABS: 900.0000 mg | ORAL_TABLET | ORAL | 0 refills | Status: AC

## 2018-10-30 NOTE — Assessment & Plan Note (Signed)
She has completed treatment for latent tuberculosis.  She did not have any significant side effects of her medication.  She does not need any further blood work for this.  I did not see any need to repeat a chest x-ray.  She can follow-up here as needed.

## 2018-10-30 NOTE — Progress Notes (Signed)
[facility]  Patient Active Problem List   Diagnosis Date Noted  . Latent tuberculosis by blood test 08/09/2018    Priority: High  . Malignant neoplasm of lower-inner quadrant of left breast in female, estrogen receptor positive ([place]) 10/04/2016    Priority: Medium  . Rheumatoid arthritis of multiple sites without organ or system involvement with positive rheumatoid factor ([place]) 10/01/2014    Priority: Medium  . Osteoporosis 10/04/2016  . High risk medication use 06/08/2016  . Age-related osteoporosis without current pathological fracture 06/08/2016  . ANA positive 06/08/2016  . Moderate persistent asthma 06/08/2016  . Normocytic anemia 10/01/2014  . Subcapital fracture of left hip ([place]) 10/01/2014    Patient's Medications  New Prescriptions   No medications on file  Previous Medications   ABATACEPT (ORENCIA) 125 MG/ML SOSY    INJECT 125 MG INTO THE SKIN ONCE A WEEK.   ACETAMINOPHEN (TYLENOL) 650 MG CR TABLET    Take 1,300 mg by mouth 2 (two) times daily as needed for pain. Take one hour prior to infusion    ALBUTEROL (PROVENTIL HFA;VENTOLIN HFA) 108 (90 BASE) MCG/ACT INHALER    Inhale 1-2 puffs into the lungs every 6 (six) hours as needed for wheezing or shortness of breath.   AMLODIPINE (NORVASC) 5 MG TABLET    Take 5 mg by mouth daily.    ASPIRIN EC 81 MG TABLET    Take 81 mg by mouth daily.   CALCIUM ACETATE (PHOSLO) 667 MG CAPSULE    Take by mouth 3 (three) times daily with meals.   CALCIUM GLUCONATE 500 MG TABLET    Take 2 tablets by mouth daily.   CHOLECALCIFEROL (VITAMIN D3) 5000 UNITS TABS    Take 5,000 Units by mouth daily after breakfast.   LEFLUNOMIDE (ARAVA) 20 MG TABLET    TAKE 1 TABLET BY MOUTH EVERY DAY   MULTIPLE VITAMIN (MULTIVITAMIN WITH MINERALS) TABS TABLET    Take 1 tablet by mouth daily.   OMEGA-3 FATTY ACIDS (OMEGA-3 FISH OIL) 1200 MG CAPS    Take 1,200 mg by mouth 2 (two) times daily.   POLYETHYL GLYCOL-PROPYL GLYCOL  (SYSTANE OP)    1 drop at bedtime.    PREDNISONE (DELTASONE) 5 MG TABLET    TAKE 1 TABLET BY MOUTH EVERY DAY WITH BREAKFAST   RALOXIFENE (EVISTA) 60 MG TABLET    Take 1 tablet (60 mg total) by mouth daily.   SACCHAROMYCES BOULARDII (FLORASTOR) 250 MG CAPSULE    Take 250 mg by mouth 2 (two) times daily.   TRAVATAN Z 0.004 % SOLN OPHTHALMIC SOLUTION    Place 1 drop into the right eye at bedtime.   TURMERIC PO    Take 1 capsule by mouth daily after breakfast.   VITAMIN B-12 (CYANOCOBALAMIN) 500 MCG TABLET    Take 500 mcg by mouth daily.  Modified Medications   Modified Medication Previous Medication   ISONIAZID (NYDRAZID) 300 MG TABLET isoniazid (NYDRAZID) 300 MG tablet      Take 3 tablets (900 mg total) by mouth once a week for 1 day. Patients end date is 10/26/2018    Take 3 tablets (900 mg total) by mouth once a week for 12 doses. Patients end date is 10/26/2018   RIFAPENTINE 150 MG TABS Rifapentine 150 MG TABS      Take 6 tablets (900 mg total) by mouth once a week for 1 day.    Take 6 tablets (900  mg total) by mouth once a week.  Discontinued Medications   No medications on file    Subjective: Felicia Gonzalez is in for her routine follow-up visit.  Today she will complete her 12th and final dose of weekly isoniazid and rifapentine for latent tuberculosis.  She says that she is felt a little bit sleepy recently but otherwise feels like she has tolerated the medication well.  She did have a flare in her rheumatoid arthritis symptoms and had her prednisone increased on 10/24/2018.  She started back on Orencia yesterday.  She is feeling a little bit better.  Her liver enzymes were completely normal when she had lab work done on 10/02/2018.  Review of Systems: Review of Systems  Constitutional: Negative for chills, diaphoresis, fever and weight loss.  Respiratory: Negative for cough, sputum production and shortness of breath.   Cardiovascular: Negative for chest pain.    Past Medical History:   Diagnosis Date  . Anemia   . Arthritis    rheumatoid +CCP neg RF +ANA synovitis   . Asthma   . Cancer [facility])    Breast cancer  . Glaucoma    right eye  . Osteoporosis   . Personal history of radiation therapy 09/2016    Social History   Tobacco Use  . Smoking status: Never Smoker  . Smokeless tobacco: Never Used  Substance Use Topics  . Alcohol use: No  . Drug use: Never    Family History  Problem Relation Age of Onset  . Hypertension Father     Allergies  Allergen Reactions  . Penicillins Swelling     Has patient had a PCN reaction causing immediate rash, facial/tongue/throat swelling, SOB or lightheadedness with hypotension:#  #  #  YES  #  #  #  Has patient had a PCN reaction causing severe rash involving mucus membranes or skin necrosis:  #  #  #  UNKNOWN  #  #  #  Has patient had a PCN reaction that required hospitalization No Has patient had a PCN reaction occurring within the last 10 years: No If all of the above answers are "NO", then may proceed with Cephalosporin use.   . Sulfa Antibiotics     UNSPECIFIED REACTION     Objective: Vitals:   10/30/18 1015  BP: (!) 169/92  Pulse: 94  Temp: 98 F (36.7 C)  TempSrc: Oral  Weight: 142 lb (64.4 kg)   Body mass index is 26.83 kg/m.  Physical Exam Constitutional:      Comments: She is in good spirits.  Cardiovascular:     Rate and Rhythm: Normal rate and regular rhythm.     Heart sounds: No murmur.  Pulmonary:     Effort: Pulmonary effort is normal.     Breath sounds: Normal breath sounds.  Psychiatric:        Mood and Affect: Mood normal.     Lab Results CMP     Component Value Date/Time   NA 141 10/02/2018 1433   NA 137 04/18/2017 1302   K 4.0 10/02/2018 1433   K 3.7 04/18/2017 1302   CL 103 10/02/2018 1433   CO2 27 10/02/2018 1433   CO2 29 04/18/2017 1302   GLUCOSE 110 (H) 10/02/2018 1433   GLUCOSE 103 04/18/2017 1302   BUN 9 10/02/2018 1433   BUN 11.1 04/18/2017 1302   CREATININE  0.75 10/02/2018 1433   CREATININE 0.66 03/06/2018 1226   CREATININE 0.7 04/18/2017 1302   CALCIUM 9.7  10/02/2018 1433   CALCIUM 9.2 04/18/2017 1302   PROT 7.4 10/02/2018 1433   PROT 6.9 04/18/2017 1302   ALBUMIN 3.4 (L) 10/02/2018 1433   ALBUMIN 3.2 (L) 04/18/2017 1302   AST 32 10/02/2018 1433   AST 26 04/18/2017 1302   ALT 40 10/02/2018 1433   ALT 30 04/18/2017 1302   ALKPHOS 87 10/02/2018 1433   ALKPHOS 77 04/18/2017 1302   BILITOT 0.2 (L) 10/02/2018 1433   BILITOT 0.27 04/18/2017 1302   GFRNONAA >60 10/02/2018 1433   GFRNONAA 86 03/06/2018 1226   GFRAA >60 10/02/2018 1433   GFRAA 99 03/06/2018 1226     Problem List Items Addressed This Visit      High   Latent tuberculosis by blood test    She has completed treatment for latent tuberculosis.  She did not have any significant side effects of her medication.  She does not need any further blood work for this.  I did not see any need to repeat a chest x-ray.  She can follow-up here as needed.      Relevant Medications   isoniazid (NYDRAZID) 300 MG tablet   Rifapentine 150 MG TABS       Michel Bickers, MD [facility] [place] [phone] pager   [phone] cell 10/30/2018, 10:34 AM

## 2018-11-01 MED FILL — ORENCIA 125 MG/ML SYRINGE: 125 | 28 days supply | Qty: 4 | Fill #0

## 2018-11-20 ENCOUNTER — Telehealth: Payer: Self-pay | Admitting: *Deleted

## 2018-11-20 ENCOUNTER — Other Ambulatory Visit: Payer: Self-pay | Admitting: Rheumatology

## 2018-11-20 NOTE — Telephone Encounter (Signed)
Patient states she has taken Orencia for 3 doses now. Patient called requesting a refill on her Prednisone and advised prescription has already been sent to the pharmacy.

## 2018-11-20 NOTE — Telephone Encounter (Signed)
Last Visit: 10/24/18 Next Visit: 01/24/19  Okay to refill per Dr. Estanislado Pandy

## 2018-11-27 ENCOUNTER — Other Ambulatory Visit: Payer: Medicare Other

## 2018-11-30 MED FILL — ORENCIA 125 MG/ML SYRINGE: 125 | 28 days supply | Qty: 4 | Fill #1

## 2018-12-26 ENCOUNTER — Other Ambulatory Visit: Payer: Self-pay | Admitting: Rheumatology

## 2018-12-26 NOTE — Telephone Encounter (Signed)
Last Visit: 10/24/18 Next Visit: 01/24/19 Labs: 10/02/18 RBC 3.74, Hgb 10.5, 33.9, RDW 16.2, Glucose 110 albumin 3.4, Total bilirubin  Okay to refill per Dr. Estanislado Pandy

## 2018-12-28 MED FILL — ORENCIA 125 MG/ML SYRINGE: 125 | 28 days supply | Qty: 4 | Fill #2

## 2019-01-10 NOTE — Progress Notes (Signed)
Office Visit Note  Patient: Felicia Gonzalez             Date of Birth: 10-07-1940           MRN: [id number]             PCP: Vernie Shanks, MD Referring: Vernie Shanks, MD Visit Date: 01/24/2019 Occupation: '@GUAROCC'$ @  Subjective:  Medication management.   History of Present Illness: Felicia Gonzalez is a 78 y.o. female with history of seropositive rheumatoid arthritis, osteoarthritis and osteoporosis.  She was placed on low-dose prednisone after the last visit due to severe flare.  She has been on combination of Orencia weekly with Arava 20 mg p.o. daily.  She has been also taking prednisone 5 mg p.o. daily she has noticed improvement in her symptoms.  Currently she denies any joint pain or joint swelling.  She has chronic pain in her right shoulder joint which persist.  She also has history of osteoporosis for which she has been getting Prolia injections.  Her last injection was in May 2020.  Activities of Daily Living:  Patient reports morning stiffness for 0 minute.   Patient Denies nocturnal pain.  Difficulty dressing/grooming: Denies Difficulty climbing stairs: Denies Difficulty getting out of chair: Denies Difficulty using hands for taps, buttons, cutlery, and/or writing: Denies  Review of Systems  Constitutional: Negative for fatigue, night sweats, weight gain and weight loss.  HENT: Negative for mouth sores, trouble swallowing, trouble swallowing, mouth dryness and nose dryness.   Eyes: Negative for pain, redness, visual disturbance and dryness.  Respiratory: Positive for shortness of breath. Negative for cough and difficulty breathing.        History of asthma  Cardiovascular: Negative for chest pain, palpitations, hypertension, irregular heartbeat and swelling in legs/feet.  Gastrointestinal: Negative for blood in stool, constipation and diarrhea.  Endocrine: Negative for increased urination.  Genitourinary: Negative for vaginal dryness.  Musculoskeletal: Negative for  arthralgias, joint pain, joint swelling, myalgias, muscle weakness, morning stiffness, muscle tenderness and myalgias.  Skin: Negative for color change, rash, hair loss, skin tightness, ulcers and sensitivity to sunlight.  Allergic/Immunologic: Negative for susceptible to infections.  Neurological: Negative for dizziness, memory loss, night sweats and weakness.  Hematological: Negative for swollen glands.  Psychiatric/Behavioral: Negative for depressed mood and sleep disturbance. The patient is not nervous/anxious.     PMFS History:  Patient Active Problem List   Diagnosis Date Noted   Latent tuberculosis by blood test 08/09/2018   Malignant neoplasm of lower-inner quadrant of left breast in female, estrogen receptor positive ([place]) 10/04/2016   Osteoporosis 10/04/2016   High risk medication use 06/08/2016   Age-related osteoporosis without current pathological fracture 06/08/2016   ANA positive 06/08/2016   Moderate persistent asthma 06/08/2016   Normocytic anemia 10/01/2014   Rheumatoid arthritis of multiple sites without organ or system involvement with positive rheumatoid factor ([place]) 10/01/2014   Subcapital fracture of left hip ([place]) 10/01/2014    Past Medical History:  Diagnosis Date   Anemia    Arthritis    rheumatoid +CCP neg RF +ANA synovitis    Asthma    Cancer (HCC)    Breast cancer   Glaucoma    right eye   Osteoporosis    Personal history of radiation therapy 09/2016    Family History  Problem Relation Age of Onset   Hypertension Father    Past Surgical History:  Procedure Laterality Date   BREAST BIOPSY Left 05/14/2016   x2  BREAST BIOPSY Right 05/14/2016   BREAST LUMPECTOMY Left 07/09/2016   BREAST LUMPECTOMY WITH RADIOACTIVE SEED AND SENTINEL LYMPH NODE BIOPSY Left 07/09/2016   Procedure: BREAST LUMPECTOMY WITH RADIOACTIVE SEED AND SENTINEL LYMPH NODE BIOPSY;  Surgeon: Coralie Keens, MD;  Location: [facility];  Service: General;   Laterality: Left;   NO PAST SURGERIES     TOTAL HIP ARTHROPLASTY Left 10/01/2014   Procedure: TOTAL HIP ARTHROPLASTY;  Surgeon: Garald Balding, MD;  Location: [facility];  Service: Orthopedics;  Laterality: Left;   Social History   Social History Narrative   Not on file    There is no immunization history on file for this patient.   Objective: Vital Signs: BP 133/78 (BP Location: Left Arm, Patient Position: Sitting, Cuff Size: Normal)    Pulse 98    Resp 13    Ht '5\' 1"'$  (1.549 m)    Wt 145 lb 9.6 oz (66 kg)    BMI 27.51 kg/m    Physical Exam Vitals signs and nursing note reviewed.  Constitutional:      Appearance: She is well-developed.  HENT:     Head: Normocephalic and atraumatic.  Eyes:     Conjunctiva/sclera: Conjunctivae normal.  Neck:     Musculoskeletal: Normal range of motion.  Cardiovascular:     Rate and Rhythm: Normal rate and regular rhythm.     Heart sounds: Normal heart sounds.  Pulmonary:     Effort: Pulmonary effort is normal.     Breath sounds: Normal breath sounds.  Abdominal:     General: Bowel sounds are normal.     Palpations: Abdomen is soft.  Lymphadenopathy:     Cervical: No cervical adenopathy.  Skin:    General: Skin is warm and dry.     Capillary Refill: Capillary refill takes less than 2 seconds.  Neurological:     Mental Status: She is alert and oriented to person, place, and time.  Psychiatric:        Behavior: Behavior normal.      Musculoskeletal Exam: C-spine was good range of motion.  She has limited range of motion of her right shoulder joint.  Elbow joints and wrist joints with good range of motion.  She has DIP PIP and CMC thickening but no synovitis of her MCPs today.  Hip joints and knee joints in good range of motion with no effusion.  Ankle joints with good range of motion.  She has some osteoarthritic changes in her feet.  CDAI Exam: CDAI Score: 1.4  Patient Global: 2 mm; Provider Global: 2 mm Swollen: 0 ; Tender: 1  Joint  Exam      Right  Left  Glenohumeral   Tender       Investigation: No additional findings.  Imaging: No results found.  Recent Labs: Lab Results  Component Value Date   WBC 6.2 10/02/2018   HGB 10.5 (L) 10/02/2018   PLT 268 10/02/2018   NA 141 10/02/2018   K 4.0 10/02/2018   CL 103 10/02/2018   CO2 27 10/02/2018   GLUCOSE 110 (H) 10/02/2018   BUN 9 10/02/2018   CREATININE 0.75 10/02/2018   BILITOT 0.2 (L) 10/02/2018   ALKPHOS 87 10/02/2018   AST 32 10/02/2018   ALT 40 10/02/2018   PROT 7.4 10/02/2018   ALBUMIN 3.4 (L) 10/02/2018   CALCIUM 9.7 10/02/2018   GFRAA >60 10/02/2018   QFTBGOLD Negative 03/10/2016   QFTBGOLDPLUS POSITIVE (A) 07/21/2018   July 25, 2018 chest x-ray  was normal  Speciality Comments: Orencia IV every 4 weeks  Procedures:  No procedures performed Allergies: Penicillins and Sulfa antibiotics   Assessment / Plan:     Visit Diagnoses: Rheumatoid arthritis of multiple sites without organ or system involvement with positive rheumatoid factor (HCC) - Positive RF, positive anti-CCP, positive ANA, elevated ESR.She is clinically doing much better on combination of her Arava, Orencia and low-dose prednisone.  I detailed discussion regarding prednisone taper.  We will do a gradual taper over the next 6 weeks.  The plan is to taper prednisone 5 mg p.o. daily alternating with 2.5 mg for 2 weeks and then 2.5 mg every day for 2 weeks and then 2.5 p.o. q. every other day.  And then she will discontinue prednisone.  If she has a flare she will notify me.  High risk medication use -  Orencia 125 mg subq every 7 days (resumed May 2020), Arava 20 mg 1 tablet daily, and prednisone 5 mg 1 tablet daily. History of positive TB gold and treated for latent TB with isoniazid 900 mg + rifapentine 900 mg weekly x 12 weeks. Last chest x-ray showed no active cardiopulmonary disease on 07/26/2018 and will monitor yearly.  Last CBC/CMP within normal limits except for low  hemoglobin but stable on 10/02/2018.  Due for CBC/CMP today and will monitor every 3 months. Standing orders placed. Recommend annual influenza, Pneumovax 23, Prevnar 13, and Shingrix as indicated for immunosuppressant therapy Plan: CBC with Differential/Platelet, COMPLETE METABOLIC PANEL WITH GFR, CBC with Differential/Platelet, COMPLETE METABOLIC PANEL WITH GFR  Positive QuantiFERON-TB Gold test - patient received INH and rifampin through ID.  ANA positive  Age-related osteoporosis without current pathological fracture-She is Prolia 60 mg every 6 months managed by her oncologist along with vitamin D 5000 units daily. Her last injection was on 10/02/2018. She is also on Evista for breast cancer. She was previously treated with Fosamax for 2 years.  She fractured her hip in May 2016. Last DEXA on 09/27/2016 showed T score of -3.2 at [facility].  She is due for repeat DEXA and order placed by her oncologist.  Malignant neoplasm of upper-inner quadrant of left breast in female, estrogen receptor positive ([place]) - followed up by oncology.  History of anemia  History of asthma  Orders: Orders Placed This Encounter  Procedures   CBC with Differential/Platelet   COMPLETE METABOLIC PANEL WITH GFR   CBC with Differential/Platelet   COMPLETE METABOLIC PANEL WITH GFR   Meds ordered this encounter  Medications   Abatacept (ORENCIA) 125 MG/ML SOSY    Sig: INJECT 125 MG INTO THE SKIN ONCE A WEEK.    Dispense:  12 mL    Refill:  0      Follow-Up Instructions: Return in about 5 months (around 06/26/2019) for Rheumatoid arthritis, Osteoporosis.   Bo Merino, MD  Note - This record has been created using Editor, commissioning.  Chart creation errors have been sought, but may not always  have been located. Such creation errors do not reflect on  the standard of medical care.

## 2019-01-17 ENCOUNTER — Other Ambulatory Visit: Payer: Self-pay | Admitting: Rheumatology

## 2019-01-17 NOTE — Telephone Encounter (Signed)
Last Visit: 10/24/18 Next Visit: 01/24/19  Okay to refill per Dr. Estanislado Pandy

## 2019-01-23 ENCOUNTER — Other Ambulatory Visit: Payer: Self-pay | Admitting: Rheumatology

## 2019-01-24 ENCOUNTER — Encounter: Payer: Self-pay | Admitting: Rheumatology

## 2019-01-24 ENCOUNTER — Ambulatory Visit (INDEPENDENT_AMBULATORY_CARE_PROVIDER_SITE_OTHER): Payer: Medicare Other | Admitting: Rheumatology

## 2019-01-24 ENCOUNTER — Other Ambulatory Visit: Payer: Self-pay | Admitting: Rheumatology

## 2019-01-24 ENCOUNTER — Other Ambulatory Visit: Payer: Self-pay

## 2019-01-24 VITALS — BP 133/78 | HR 98 | Resp 13 | Ht 61.0 in | Wt 145.6 lb

## 2019-01-24 DIAGNOSIS — Z79899 Other long term (current) drug therapy: Secondary | ICD-10-CM | POA: Diagnosis not present

## 2019-01-24 DIAGNOSIS — Z170 Estrogen receptor positive status [ER+]: Secondary | ICD-10-CM

## 2019-01-24 DIAGNOSIS — M0579 Rheumatoid arthritis with rheumatoid factor of multiple sites without organ or systems involvement: Secondary | ICD-10-CM

## 2019-01-24 DIAGNOSIS — R7612 Nonspecific reaction to cell mediated immunity measurement of gamma interferon antigen response without active tuberculosis: Secondary | ICD-10-CM

## 2019-01-24 DIAGNOSIS — R768 Other specified abnormal immunological findings in serum: Secondary | ICD-10-CM | POA: Diagnosis not present

## 2019-01-24 DIAGNOSIS — Z862 Personal history of diseases of the blood and blood-forming organs and certain disorders involving the immune mechanism: Secondary | ICD-10-CM

## 2019-01-24 DIAGNOSIS — C50212 Malignant neoplasm of upper-inner quadrant of left female breast: Secondary | ICD-10-CM

## 2019-01-24 DIAGNOSIS — M810 Age-related osteoporosis without current pathological fracture: Secondary | ICD-10-CM

## 2019-01-24 DIAGNOSIS — Z8709 Personal history of other diseases of the respiratory system: Secondary | ICD-10-CM

## 2019-01-24 MED ORDER — ORENCIA 125 MG/ML ~~LOC~~ SOSY: PREFILLED_SYRINGE | SUBCUTANEOUS | 0 refills | Status: DC

## 2019-01-24 MED FILL — ORENCIA 125 MG/ML SYRINGE: 125 | 28 days supply | Qty: 4 | Fill #0

## 2019-01-24 NOTE — Patient Instructions (Signed)
Standing Labs We placed an order today for your standing lab work.    Please come back and get your standing labs in November and every 3 months  We have open lab daily Monday through Thursday from 8:30-12:30 PM and 1:30-4:30 PM and Friday from 8:30-12:30 PM and 1:30 -4:00 PM at the office of Dr. Bo Merino.   You may experience shorter wait times on Monday and Friday afternoons. The office is located at [address], [place], [place], [place] [address] No appointment is necessary.   Labs are drawn by [facility].  You may receive a bill from [facility] for your lab work.  If you wish to have your labs drawn at another location, please call the office 24 hours in advance to send orders.  If you have any questions regarding directions or hours of operation,  please call [phone].   Just as a reminder please drink plenty of water prior to coming for your lab work. Thanks!   Please reduce your prednisone dose to 5 mg alternating with 2.5 mg every other day for 2 weeks then reduce it to 2.5 mg daily for 2 weeks then 2.5 mg every other day for 2 weeks and then discontinue.

## 2019-01-25 LAB — COMPLETE METABOLIC PANEL WITH GFR
AG Ratio: 1.5 (calc) (ref 1.0–2.5)
ALT: 39 U/L — ABNORMAL HIGH (ref 6–29)
AST: 32 U/L (ref 10–35)
Albumin: 4 g/dL (ref 3.6–5.1)
Alkaline phosphatase (APISO): 68 U/L (ref 37–153)
BUN: 14 mg/dL (ref 7–25)
CO2: 28 mmol/L (ref 20–32)
Calcium: 9.6 mg/dL (ref 8.6–10.4)
Chloride: 101 mmol/L (ref 98–110)
Creat: 0.7 mg/dL (ref 0.60–0.93)
GFR, Est African American: 97 mL/min/{1.73_m2} (ref >=60)
GFR, Est Non African American: 84 mL/min/{1.73_m2} (ref >=60)
Globulin: 2.6 g/dL (calc) (ref 1.9–3.7)
Glucose, Bld: 103 mg/dL — ABNORMAL HIGH (ref 65–99)
Potassium: 4.7 mmol/L (ref 3.5–5.3)
Sodium: 138 mmol/L (ref 135–146)
Total Bilirubin: 0.3 mg/dL (ref 0.2–1.2)
Total Protein: 6.6 g/dL (ref 6.1–8.1)

## 2019-01-25 LAB — CBC WITH DIFFERENTIAL/PLATELET
Absolute Monocytes: 891 cells/uL (ref 200–950)
Basophils Absolute: 53 cells/uL (ref 0–200)
Basophils Relative: 0.8 %
Eosinophils Absolute: 119 cells/uL (ref 15–500)
Eosinophils Relative: 1.8 %
HCT: 32.9 % — ABNORMAL LOW (ref 35.0–45.0)
Hemoglobin: 10.6 g/dL — ABNORMAL LOW (ref 11.7–15.5)
Lymphs Abs: 1564 cells/uL (ref 850–3900)
MCH: 27.9 pg (ref 27.0–33.0)
MCHC: 32.2 g/dL (ref 32.0–36.0)
MCV: 86.6 fL (ref 80.0–100.0)
MPV: 10.1 fL (ref 7.5–12.5)
Monocytes Relative: 13.5 %
Neutro Abs: 3973 cells/uL (ref 1500–7800)
Neutrophils Relative %: 60.2 %
Platelets: 299 10*3/uL (ref 140–400)
RBC: 3.8 10*6/uL (ref 3.80–5.10)
RDW: 15.7 % — ABNORMAL HIGH (ref 11.0–15.0)
Total Lymphocyte: 23.7 %
WBC: 6.6 10*3/uL (ref 3.8–10.8)

## 2019-01-25 NOTE — Progress Notes (Signed)
Hb low, stable. ALT mildly elevated , stable.

## 2019-02-09 ENCOUNTER — Other Ambulatory Visit: Payer: Medicare Other

## 2019-02-22 MED FILL — ORENCIA 125 MG/ML SYRINGE: 125 | 28 days supply | Qty: 4 | Fill #1

## 2019-03-17 ENCOUNTER — Other Ambulatory Visit: Payer: Self-pay | Admitting: Rheumatology

## 2019-03-19 ENCOUNTER — Ambulatory Visit: Payer: Medicare Other

## 2019-03-19 NOTE — Telephone Encounter (Signed)
Last Visit: 01/24/19 Next Visit: 06/27/19  Okay to refill per Dr. Estanislado Pandy

## 2019-03-20 ENCOUNTER — Other Ambulatory Visit: Payer: Self-pay | Admitting: Rheumatology

## 2019-03-20 NOTE — Telephone Encounter (Signed)
Last Visit: 01/24/19 Next Visit: 06/27/19 Labs: 01/24/19 Hb low, stable. ALT mildly elevated , stable  Okay to refill per Dr. Estanislado Pandy

## 2019-03-26 MED FILL — ORENCIA 125 MG/ML SYRINGE: 125 | 28 days supply | Qty: 4 | Fill #2

## 2019-04-03 ENCOUNTER — Telehealth: Payer: Self-pay | Admitting: *Deleted

## 2019-04-03 NOTE — Telephone Encounter (Signed)
Needs to cancel appts for 11/2 and reschedule for Friday or next week. 11/4 Appts cancelled, schedule messages sent.

## 2019-04-04 ENCOUNTER — Ambulatory Visit: Payer: Medicare Other | Admitting: Hematology

## 2019-04-04 ENCOUNTER — Telehealth: Payer: Self-pay | Admitting: Hematology

## 2019-04-04 ENCOUNTER — Other Ambulatory Visit: Payer: Medicare Other

## 2019-04-04 ENCOUNTER — Ambulatory Visit: Payer: Medicare Other

## 2019-04-04 NOTE — Telephone Encounter (Signed)
R/s appt per 11/3 sch message - pt aware of new appt date and time   

## 2019-04-10 ENCOUNTER — Other Ambulatory Visit: Payer: Medicare Other

## 2019-04-11 ENCOUNTER — Inpatient Hospital Stay: Payer: Medicare Other | Attending: Hematology

## 2019-04-11 ENCOUNTER — Inpatient Hospital Stay (HOSPITAL_BASED_OUTPATIENT_CLINIC_OR_DEPARTMENT_OTHER): Payer: Medicare Other | Admitting: Hematology

## 2019-04-11 ENCOUNTER — Other Ambulatory Visit: Payer: Self-pay

## 2019-04-11 ENCOUNTER — Inpatient Hospital Stay: Payer: Medicare Other

## 2019-04-11 VITALS — BP 166/89 | HR 88 | Temp 98.3°F | Resp 20 | Ht 61.0 in | Wt 142.0 lb

## 2019-04-11 DIAGNOSIS — Z170 Estrogen receptor positive status [ER+]: Secondary | ICD-10-CM

## 2019-04-11 DIAGNOSIS — C50312 Malignant neoplasm of lower-inner quadrant of left female breast: Secondary | ICD-10-CM

## 2019-04-11 DIAGNOSIS — D638 Anemia in other chronic diseases classified elsewhere: Secondary | ICD-10-CM | POA: Diagnosis not present

## 2019-04-11 DIAGNOSIS — D509 Iron deficiency anemia, unspecified: Secondary | ICD-10-CM

## 2019-04-11 DIAGNOSIS — M810 Age-related osteoporosis without current pathological fracture: Secondary | ICD-10-CM

## 2019-04-11 DIAGNOSIS — M069 Rheumatoid arthritis, unspecified: Secondary | ICD-10-CM | POA: Diagnosis not present

## 2019-04-11 DIAGNOSIS — C50912 Malignant neoplasm of unspecified site of left female breast: Secondary | ICD-10-CM | POA: Insufficient documentation

## 2019-04-11 LAB — CBC WITH DIFFERENTIAL/PLATELET
Abs Immature Granulocytes: 0.01 10*3/uL (ref 0.00–0.07)
Basophils Absolute: 0.1 10*3/uL (ref 0.0–0.1)
Basophils Relative: 1 %
Eosinophils Absolute: 0.2 10*3/uL (ref 0.0–0.5)
Eosinophils Relative: 4 %
HCT: 33.1 % — ABNORMAL LOW (ref 36.0–46.0)
Hemoglobin: 10.5 g/dL — ABNORMAL LOW (ref 12.0–15.0)
Immature Granulocytes: 0 %
Lymphocytes Relative: 33 %
Lymphs Abs: 1.6 10*3/uL (ref 0.7–4.0)
MCH: 28.2 pg (ref 26.0–34.0)
MCHC: 31.7 g/dL (ref 30.0–36.0)
MCV: 89 fL (ref 80.0–100.0)
Monocytes Absolute: 0.7 10*3/uL (ref 0.1–1.0)
Monocytes Relative: 15 %
Neutro Abs: 2.3 10*3/uL (ref 1.7–7.7)
Neutrophils Relative %: 47 %
Platelets: 257 10*3/uL (ref 150–400)
RBC: 3.72 MIL/uL — ABNORMAL LOW (ref 3.87–5.11)
RDW: 15.5 % (ref 11.5–15.5)
WBC: 4.9 10*3/uL (ref 4.0–10.5)
nRBC: 0 % (ref 0.0–0.2)

## 2019-04-11 LAB — FERRITIN: Ferritin: 184 ng/mL (ref 11–307)

## 2019-04-11 LAB — CMP (CANCER CENTER ONLY)
ALT: 34 U/L (ref 0–44)
AST: 36 U/L (ref 15–41)
Albumin: 3.4 g/dL — ABNORMAL LOW (ref 3.5–5.0)
Alkaline Phosphatase: 85 U/L (ref 38–126)
Anion gap: 8 (ref 5–15)
BUN: 13 mg/dL (ref 8–23)
CO2: 30 mmol/L (ref 22–32)
Calcium: 9.3 mg/dL (ref 8.9–10.3)
Chloride: 100 mmol/L (ref 98–111)
Creatinine: 0.77 mg/dL (ref 0.44–1.00)
GFR, Est AFR Am: >60 mL/min (ref >60)
GFR, Estimated: >60 mL/min (ref >60)
Glucose, Bld: 90 mg/dL (ref 70–99)
Potassium: 3.7 mmol/L (ref 3.5–5.1)
Sodium: 138 mmol/L (ref 135–145)
Total Bilirubin: 0.2 mg/dL — ABNORMAL LOW (ref 0.3–1.2)
Total Protein: 6.9 g/dL (ref 6.5–8.1)

## 2019-04-11 LAB — VITAMIN D 25 HYDROXY (VIT D DEFICIENCY, FRACTURES): Vit D, 25-Hydroxy: 61.82 ng/mL (ref 30–100)

## 2019-04-11 MED ORDER — DENOSUMAB 60 MG/ML ~~LOC~~ SOSY
PREFILLED_SYRINGE | SUBCUTANEOUS | Status: AC
  Filled 2019-04-11: qty 1

## 2019-04-11 MED ORDER — DENOSUMAB 60 MG/ML ~~LOC~~ SOLN
60.0000 mg | Freq: Once | SUBCUTANEOUS | Status: AC
  Administered 2019-04-11: 60 mg via SUBCUTANEOUS

## 2019-04-11 NOTE — Patient Instructions (Signed)
Denosumab injection What is this medicine? DENOSUMAB (den oh sue mab) slows bone breakdown. Prolia is used to treat osteoporosis in women after menopause and in men, and in people who are taking corticosteroids for 6 months or more. Xgeva is used to treat a high calcium level due to cancer and to prevent bone fractures and other bone problems caused by multiple myeloma or cancer bone metastases. Xgeva is also used to treat giant cell tumor of the bone. This medicine may be used for other purposes; ask your health care provider or pharmacist if you have questions. COMMON BRAND NAME(S): Prolia, XGEVA What should I tell my health care provider before I take this medicine? They need to know if you have any of these conditions:  dental disease  having surgery or tooth extraction  infection  kidney disease  low levels of calcium or Vitamin D in the blood  malnutrition  on hemodialysis  skin conditions or sensitivity  thyroid or parathyroid disease  an unusual reaction to denosumab, other medicines, foods, dyes, or preservatives  pregnant or trying to get pregnant  breast-feeding How should I use this medicine? This medicine is for injection under the skin. It is given by a health care professional in a hospital or clinic setting. A special MedGuide will be given to you before each treatment. Be sure to read this information carefully each time. For Prolia, talk to your pediatrician regarding the use of this medicine in children. Special care may be needed. For Xgeva, talk to your pediatrician regarding the use of this medicine in children. While this drug may be prescribed for children as young as 13 years for selected conditions, precautions do apply. Overdosage: If you think you have taken too much of this medicine contact a poison control center or emergency room at once. NOTE: This medicine is only for you. Do not share this medicine with others. What if I miss a dose? It is  important not to miss your dose. Call your doctor or health care professional if you are unable to keep an appointment. What may interact with this medicine? Do not take this medicine with any of the following medications:  other medicines containing denosumab This medicine may also interact with the following medications:  medicines that lower your chance of fighting infection  steroid medicines like prednisone or cortisone This list may not describe all possible interactions. Give your health care provider a list of all the medicines, herbs, non-prescription drugs, or dietary supplements you use. Also tell them if you smoke, drink alcohol, or use illegal drugs. Some items may interact with your medicine. What should I watch for while using this medicine? Visit your doctor or health care professional for regular checks on your progress. Your doctor or health care professional may order blood tests and other tests to see how you are doing. Call your doctor or health care professional for advice if you get a fever, chills or sore throat, or other symptoms of a cold or flu. Do not treat yourself. This drug may decrease your body's ability to fight infection. Try to avoid being around people who are sick. You should make sure you get enough calcium and vitamin D while you are taking this medicine, unless your doctor tells you not to. Discuss the foods you eat and the vitamins you take with your health care professional. See your dentist regularly. Brush and floss your teeth as directed. Before you have any dental work done, tell your dentist you are   receiving this medicine. Do not become pregnant while taking this medicine or for 5 months after stopping it. Talk with your doctor or health care professional about your birth control options while taking this medicine. Women should inform their doctor if they wish to become pregnant or think they might be pregnant. There is a potential for serious side  effects to an unborn child. Talk to your health care professional or pharmacist for more information. What side effects may I notice from receiving this medicine? Side effects that you should report to your doctor or health care professional as soon as possible:  allergic reactions like skin rash, itching or hives, swelling of the face, lips, or tongue  bone pain  breathing problems  dizziness  jaw pain, especially after dental work  redness, blistering, peeling of the skin  signs and symptoms of infection like fever or chills; cough; sore throat; pain or trouble passing urine  signs of low calcium like fast heartbeat, muscle cramps or muscle pain; pain, tingling, numbness in the hands or feet; seizures  unusual bleeding or bruising  unusually weak or tired Side effects that usually do not require medical attention (report to your doctor or health care professional if they continue or are bothersome):  constipation  diarrhea  headache  joint pain  loss of appetite  muscle pain  runny nose  tiredness  upset stomach This list may not describe all possible side effects. Call your doctor for medical advice about side effects. You may report side effects to FDA at 1-800-FDA-1088. Where should I keep my medicine? This medicine is only given in a clinic, doctor's office, or other health care setting and will not be stored at home. NOTE: This sheet is a summary. It may not cover all possible information. If you have questions about this medicine, talk to your doctor, pharmacist, or health care provider.  2020 Elsevier/Gold Standard (2017-09-23 16:10:44)

## 2019-04-11 NOTE — Progress Notes (Signed)
[name]  HEMATOLOGY ONCOLOGY CLINIC NOTE  Date of service: 04/11/19   Patient Care Team: Vernie Shanks, MD as PCP - General (Family Medicine)  Chief Complaint:  F/u for ER/PR positive Her neg Breast cancer  Diagnosis  Stage I (pT1b, pN0, Mx) left sided invasive ductal carcinoma of the breast. 1 cm in size with lymphovascular invasion grade 2/3, Ki-67 15% ER/PR positive HER-2/neu negative. One Sentinel lymph node biopsy negative No DCIS. Severe calcified benign lesions in left breast with PASH (pseudoangiomatous stromal hyperplasia).  Patient is status post left breast lumpectomy by Dr. Ninfa Linden on 07/09/2016.   S/p adjuvant  Left breast RT on 10/05/2016  INTERVAL HISTORY:  Ms Estrella is here for her scheduled follow-up for stage I left breast cancer. The patient's last visit with [place] was on 10/02/2018. The pt reports that she is doing well overall.  The pt reports that her blood pressure is high today but she feels that this was an abnormal reading. Her blood pressures at home have been closer to the normal range. She has decreased her Prednisone dosage from 5 mg ot 2.5 mg. Pt completed INH in June and has been back on Orencia since then. Pt is doing well with the Orencia and does not have any complaints. Pt denies any issues taking Raloxifene. She has continued eating and sleeping well. She has continued to add lots of fresh fruits and veggies to her diet. Pt denies any new bone pain or breast symptoms. She has continued to stay in touch with her family via video chats and is not allowing people over her home as a safety precaution during the pandemic. Her doctor usually calls her for her annual mammogram, she had her last on 05/25/2018. Her next bone density study is scheduled for 04/20/2019. She has continued to take her Vitamin D supplements and receive her Prolia injections.   Lab results today (04/11/19) of CBC w/diff and CMP is as follows: all values are WNL except for RBC at 3.72, Hgb at  10.5, HCT at 33.1, Albumin at 3.4, Total Bilirubin at 0.2. 04/11/2019 Ferritin at 184 04/11/2019 Vitamin D 25-Hydroxy at 61.82   On review of systems, pt denies back pain, new bone pain, breast symptoms, leg swelling, swollen joints and any other symptoms.    REVIEW OF SYSTEMS:   A 10+ POINT REVIEW OF SYSTEMS WAS OBTAINED including neurology, dermatology, psychiatry, cardiac, respiratory, lymph, extremities, GI, GU, Musculoskeletal, constitutional, breasts, reproductive, HEENT.  All pertinent positives are noted in the HPI.  All others are negative.   . Past Medical History:  Diagnosis Date  . Anemia   . Arthritis    rheumatoid +CCP neg RF +ANA synovitis   . Asthma   . Cancer [facility])    Breast cancer  . Glaucoma    right eye  . Osteoporosis   . Personal history of radiation therapy 09/2016     Past Surgical History:  Procedure Laterality Date  . BREAST BIOPSY Left 05/14/2016   x2  . BREAST BIOPSY Right 05/14/2016  . BREAST LUMPECTOMY Left 07/09/2016  . BREAST LUMPECTOMY WITH RADIOACTIVE SEED AND SENTINEL LYMPH NODE BIOPSY Left 07/09/2016   Procedure: BREAST LUMPECTOMY WITH RADIOACTIVE SEED AND SENTINEL LYMPH NODE BIOPSY;  Surgeon: Coralie Keens, MD;  Location: [place];  Service: General;  Laterality: Left;  . NO PAST SURGERIES    . TOTAL HIP ARTHROPLASTY Left 10/01/2014   Procedure: TOTAL HIP ARTHROPLASTY;  Surgeon: Garald Balding, MD;  Location: [place];  Service:  Orthopedics;  Laterality: Left;    Social History   Tobacco Use  . Smoking status: Never Smoker  . Smokeless tobacco: Never Used  Substance Use Topics  . Alcohol use: No  . Drug use: Never    ALLERGIES:  is allergic to penicillins and sulfa antibiotics.  MEDICATIONS:  Current Outpatient Medications  Medication Sig Dispense Refill  . acetaminophen (TYLENOL) 650 MG CR tablet Take 1,300 mg by mouth as needed for pain.     [name] albuterol (PROVENTIL HFA;VENTOLIN HFA) 108 (90 Base) MCG/ACT inhaler Inhale 1-2 puffs  into the lungs every 6 (six) hours as needed for wheezing or shortness of breath.    [name] amLODipine (NORVASC) 5 MG tablet Take 5 mg by mouth daily.     [name] aspirin EC 81 MG tablet Take 81 mg by mouth daily.    . calcium gluconate 500 MG tablet Take 2 tablets by mouth daily.    . Cholecalciferol (VITAMIN D3) 5000 units TABS Take 5,000 Units by mouth daily after breakfast.    . latanoprost (XALATAN) 0.005 % ophthalmic solution INSTILL 1 DROP INTO BOTH EYES EVERY DAY IN THE EVENING    . leflunomide (ARAVA) 20 MG tablet TAKE 1 TABLET BY MOUTH EVERY DAY 90 tablet 0  . Multiple Vitamin (MULTIVITAMIN WITH MINERALS) TABS tablet Take 1 tablet by mouth daily.    . Omega-3 Fatty Acids (OMEGA-3 FISH OIL) 1200 MG CAPS Take 1,200 mg by mouth 2 (two) times daily.    [name] ORENCIA 125 MG/ML SOSY INJECT 125 MG INTO THE SKIN ONCE A WEEK. 12 mL 0  . Polyethyl Glycol-Propyl Glycol (SYSTANE OP) 1 drop as needed.     . predniSONE (DELTASONE) 5 MG tablet TAKE 1 TABLET BY MOUTH EVERY DAY WITH BREAKFAST 30 tablet 1  . Pyridoxine HCl (VITAMIN B-6 PO) Take by mouth daily.    . raloxifene (EVISTA) 60 MG tablet Take 1 tablet (60 mg total) by mouth daily. 30 tablet 11  . saccharomyces boulardii (FLORASTOR) 250 MG capsule Take 250 mg by mouth 2 (two) times daily.    . TURMERIC PO Take 1 capsule by mouth daily after breakfast.    . vitamin B-12 (CYANOCOBALAMIN) 500 MCG tablet Take 500 mcg by mouth daily.     No current facility-administered medications for this visit.     PHYSICAL EXAMINATION: ECOG PERFORMANCE STATUS: 1 - Symptomatic but completely ambulatory  Vitals:   04/11/19 1446 04/11/19 1453  BP: (!) 180/105 (!) 166/89  Pulse: 88   Resp: 20   Temp: 98.3 F (36.8 C)   SpO2: 100%     Filed Weights   04/11/19 1446  Weight: 142 lb (64.4 kg)   Body mass index is 26.83 kg/m.  GENERAL:alert, in no acute distress and comfortable SKIN: no acute rashes, no significant lesions EYES: conjunctiva are pink and  non-injected, sclera anicteric OROPHARYNX: MMM, no exudates, no oropharyngeal erythema or ulceration NECK: supple, no JVD LYMPH:  no palpable lymphadenopathy in the cervical, axillary or inguinal regions LUNGS: clear to auscultation b/l with normal respiratory effort HEART: regular rate & rhythm ABDOMEN:  normoactive bowel sounds , non tender, not distended. No palpable hepatosplenomegaly.  Extremity: no pedal edema PSYCH: alert & oriented x 3 with fluent speech NEURO: no focal motor/sensory deficits   LABORATORY DATA:   I have reviewed the data as listed  . CBC Latest Ref Rng & Units 04/11/2019 01/24/2019 10/02/2018  WBC 4.0 - 10.5 K/uL 4.9 6.6 6.2  Hemoglobin 12.0 - 15.0  g/dL 10.5(L) 10.6(L) 10.5(L)  Hematocrit 36.0 - 46.0 % 33.1(L) 32.9(L) 33.9(L)  Platelets 150 - 400 K/uL 257 299 268   . CMP Latest Ref Rng & Units 04/11/2019 01/24/2019 10/02/2018  Glucose 70 - 99 mg/dL 90 103(H) 110(H)  BUN 8 - 23 mg/dL '13 14 9  '$ Creatinine 0.44 - 1.00 mg/dL 0.77 0.70 0.75  Sodium 135 - 145 mmol/L 138 138 141  Potassium 3.5 - 5.1 mmol/L 3.7 4.7 4.0  Chloride 98 - 111 mmol/L 100 101 103  CO2 22 - 32 mmol/L '30 28 27  '$ Calcium 8.9 - 10.3 mg/dL 9.3 9.6 9.7  Total Protein 6.5 - 8.1 g/dL 6.9 6.6 7.4  Total Bilirubin 0.3 - 1.2 mg/dL 0.2(L) 0.3 0.2(L)  Alkaline Phos 38 - 126 U/L 85 - 87  AST 15 - 41 U/L 36 32 32  ALT 0 - 44 U/L 34 39(H) 40    25 OH vit D - 54.7---> 49.2  . Lab Results  Component Value Date   IRON 81 04/18/2017   TIBC 313 04/18/2017   IRONPCTSAT 26 04/18/2017   (Iron and TIBC)  Lab Results  Component Value Date   FERRITIN 184 04/11/2019   B12- 686        Microscopic Comment 1. BREAST, INVASIVE TUMOR Procedure: Seed localized lumpectomy. Laterality: Left breast. Tumor Size (gross measurement): 1.0 cm Histologic Type: Ductal Grade: II Tubular Differentiation: 3 Nuclear Pleomorphism: 2 Mitotic Count: 1 Ductal Carcinoma in Situ (DCIS): Not identified Extent of  Tumor: Confined to breast parenchyma Margins: Invasive carcinoma, distance from closest margin: Focally 0.1 cm to the anterior margin Regional Lymph Nodes: Number of Lymph Nodes Examined: 1 Number of Sentinel Lymph Nodes Examined: 1 Lymph Nodes with Macrometastases: 0 Lymph Nodes with Micrometastases: 0 Lymph Nodes with Isolated Tumor Cells: 0 Breast Prognostic Profile: [id number] Estrogen Receptor: 100%, strong Progesterone Receptor: 5%, strong Her2: No amplification was detected. The ratio was 1.27 1 of 3 FINAL for MAKAYLEN, THIEME B [phone]) Microscopic Comment(continued) Ki-67: 15% Pathologic Stage Classification (pTNM, AJCC 8th Edition): Primary Tumor (pT): pT1b Regional Lymph Nodes (pN): pN0 (JBK:ecj 07/12/2016) Enid Cutter MD Pathologist, Electronic Signature (Case signed 07/12/2016)     CLINICAL DATA:  Followup for probably benign right breast mass and probably benign left breast calcifications.  EXAM: 2D DIGITAL DIAGNOSTIC BILATERAL MAMMOGRAM WITH CAD AND ADJUNCT TOMO  BILATERAL BREAST ULTRASOUND  COMPARISON:  Previous exam(s).  ACR Breast Density Category c: The breast tissue is heterogeneously dense, which may obscure small masses.  FINDINGS: No suspicious masses or calcifications are seen in the right breast. The oval circumscribed mass in the upper-outer posterior right breast appears unchanged. Spot compression magnification views were performed over the inner left breast demonstrating unchanged 3 mm group of punctate calcifications. There is a mass with irregular margins in the lower inner left breast measuring approximately 7-8 mm.  Mammographic images were processed with CAD.  Physical examination of the left breast reveals a firm nodule at the 8 o'clock position.  Targeted ultrasound of the left breast was performed demonstrating an irregular hypoechoic mass at 8 o'clock 2 cm from nipple measuring 0.8 x 0.6 x 0.7 cm. This  corresponds well with the mass seen at mammography. No lymphadenopathy seen in the left axilla.  Targeted ultrasound of the right breast was performed demonstrating a mixed echogenicity mass at 10 o'clock 8 cm from nipple measuring 1.4 x 0.6 x 1.1 cm. Although this may represent an area of fat necrosis is indeterminate. No lymphadenopathy seen  in the right axilla.  IMPRESSION: 1. Suspicious mass in the left breast at the 8 o'clock position.  2. Stable appearance of punctate calcifications in the lower inner left breast.  3. Indeterminate mass in the right breast at 10 o'clock 8 cm from the nipple which cannot be clearly characterized as an area of fat necrosis.  RECOMMENDATION: 1. Recommend ultrasound-guided biopsy of the mass in the left breast at the 8 o'clock position.  2. Recommend stereotactic guided biopsy of the calcifications in the lower inner left breast (given the suspicious mass seen in close proximity to the calcifications).  3. Recommend ultrasound-guided biopsy of the mass in the right breast at 10 o'clock position.  Biopsies are being scheduled for the patient.  I have discussed the findings and recommendations with the patient. Results were also provided in writing at the conclusion of the visit. If applicable, a reminder letter will be sent to the patient regarding the next appointment.  BI-RADS CATEGORY  4: Suspicious.   Electronically Signed   By: Everlean Alstrom M.D.   On: 05/12/2016 13:56   RADIOGRAPHIC STUDIES: I have personally reviewed the radiological images as listed and agreed with the findings in the report. No results found.  ASSESSMENT & PLAN:   78 y.o.  female with  #1 Stage I (pT1b, pN0, Mx) left sided invasive ductal carcinoma of the breast. 1 cm in size with lymphovascular invasion grade 2/3, Ki-67 15% ER/PR positive HER-2/neu negative. One Sentinel lymph node biopsy negative No DCIS. Severe calcified benign  lesions in left breast with PASH (pseudoangiomatous stromal hyperplasia).  Patient had left breast lumpectomy by Dr. Ninfa Linden on 07/09/2016.  She completed her adjuvant radiation therapy on 09/2016  Has been on Raloxifene since then for adjuvant endocrine therapy. (AI not considered due to severe osteoporosis with fractures)  05/26/18 MM revealed No mammographic evidence of malignancy in either breast. 2. Stable left breast posttreatment changes.  #2 Severe osteoporosis - multiple risk factors including chronic steroid therapy, age, rheumatoid arthritis. Her bone density study shows significant osteoporosis despite being on Fosamax for 2 years. Last vitamin D levels are adequate at 49 She has had an osteoporotic left hip fracture around May 2016.  #3 Rheumatoid arthritis was on Orencia per rheumatology - currently off due to LTBI status Plan -Continue follow-up with Dr. Estanislado Pandy from rheumatology for ongoing treatments.   #4 Anemia of chronic disease due to chronic inflammation from RA  Plan -no indication for additional IV Iron at this time -continue to optimize RA management.  #5 New +ve TB quantiferon test - currently on treatment for LTBI  PLAN:  -Discussed pt labwork today, 04/11/19; blood counts look good, blood chemistries are stable -Discussed 04/11/2019 Ferritin at 184 -Discussed 04/11/2019 Vitamin D 25-Hydroxy at 61.82  -no clinical or lab evidence of breast cancer recurrence at this time. -Suspect that mild anemia is related to inflammation from RA and medications. No current indication for IV iron transfusion.  -The pt shows no clinical or lab progression/return of her breast cancer at this time.  -Continue Prolia every 6 months -Continue '81mg'$  aspirin and 5000 units Vitamin D replacement  -Recommend that the pt take Vitamin B6 or a Vitamin B complex -Bone Density Scan scheduled for 04/20/2019 -Mammogram in 4 weeks  -Will see the pt back in 6 months    FOLLOW UP:  DEXA scan as scheduled on 11/20 MMG in 4 weeks Plz schedule next 2 doses of Prolia q6 months RTC with Dr Irene Limbo with  labs with next dose of Prolia in 6 months  The total time spent in the appt was 25 minutes and more than 50% was on counseling and direct patient cares.  All of the patient's questions were answered with apparent satisfaction. The patient knows to call the clinic with any problems, questions or concerns.    Sullivan Lone MD [place] AAHIVMS [facility] Hematology/Oncology Physician [facility]  (Office):       [phone] (Work cell):  [phone] (Fax):           [phone]  I, Yevette Edwards, am acting as a scribe for Dr. Sullivan Lone.   .I have reviewed the above documentation for accuracy and completeness, and I agree with the above. [name] MD

## 2019-04-12 ENCOUNTER — Telehealth: Payer: Self-pay | Admitting: Hematology

## 2019-04-12 NOTE — Telephone Encounter (Signed)
Scheduled appt per 11/11 los.  Spoke with pt and she is aware of her appt date and time.,

## 2019-04-19 ENCOUNTER — Other Ambulatory Visit: Payer: Self-pay | Admitting: Hematology

## 2019-04-19 DIAGNOSIS — C50212 Malignant neoplasm of upper-inner quadrant of left female breast: Secondary | ICD-10-CM

## 2019-04-19 DIAGNOSIS — Z170 Estrogen receptor positive status [ER+]: Secondary | ICD-10-CM

## 2019-04-20 ENCOUNTER — Other Ambulatory Visit: Payer: Medicare Other

## 2019-04-23 ENCOUNTER — Other Ambulatory Visit: Payer: Self-pay | Admitting: Rheumatology

## 2019-04-23 NOTE — Telephone Encounter (Signed)
Last Visit: 01/24/2019 Next Visit: 06/27/2019 Labs: 04/11/2019 RBC 3.72, hemoglobin 10.5, HCT 33.1, albumin 3.4, total bilirubin 0.2. Chest xray: 07/25/2018 normal.  Okay to refill orencia?

## 2019-04-23 NOTE — Telephone Encounter (Signed)
ok 

## 2019-04-30 MED FILL — ORENCIA 125 MG/ML SYRINGE: 125 | 28 days supply | Qty: 4 | Fill #0

## 2019-05-07 ENCOUNTER — Other Ambulatory Visit: Payer: Self-pay | Admitting: Hematology

## 2019-05-15 ENCOUNTER — Telehealth: Payer: Self-pay | Admitting: Pharmacy Technician

## 2019-05-15 NOTE — Telephone Encounter (Signed)
Received notification from [facility] regarding a prior authorization for [facility]. Authorization has been APPROVED from 05/15/19 to 05/30/20.   Will send document to scan center.  Authorization # [id number] Phone # [phone]  3:40 PM Nichalas Coin Delice Lesch, CPhT

## 2019-05-17 ENCOUNTER — Other Ambulatory Visit: Payer: Self-pay | Admitting: Rheumatology

## 2019-05-17 NOTE — Telephone Encounter (Signed)
Last Visit: 01/24/2019 Next Visit: 06/27/2019  Okay to refill per Dr. Estanislado Pandy.

## 2019-05-29 MED FILL — ORENCIA 125 MG/ML SYRINGE: 125 | 28 days supply | Qty: 4 | Fill #1

## 2019-06-12 ENCOUNTER — Other Ambulatory Visit: Payer: Self-pay | Admitting: Rheumatology

## 2019-06-12 NOTE — Telephone Encounter (Signed)
Last Visit: 01/24/2019 Next Visit: 06/27/2019 Labs: 04/11/2019 RBC 3.72, hemoglobin 10.5, HCT 33.1, albumin 3.4, total bilirubin 0.2.  Okay to refill per Dr. Estanislado Pandy

## 2019-06-20 ENCOUNTER — Ambulatory Visit: Payer: Medicare Other | Attending: Internal Medicine

## 2019-06-20 DIAGNOSIS — Z23 Encounter for immunization: Secondary | ICD-10-CM

## 2019-06-20 NOTE — Progress Notes (Signed)
   Covid-19 Vaccination Clinic  Name:  Felicia Gonzalez    MRN: [id number] DOB: [birth year]  06/20/2019  [name] was observed post Covid-19 immunization for 15 minutes without incidence. She was provided with Vaccine Information Sheet and instruction to access the V-Safe system.   [name] was instructed to call 911 with any severe reactions post vaccine: [name] Difficulty breathing  . Swelling of your face and throat  . A fast heartbeat  . A bad rash all over your body  . Dizziness and weakness    Immunizations Administered    Name Date Dose VIS Date Route   Pfizer COVID-19 Vaccine 06/20/2019  1:08 PM 0.3 mL 05/11/2019 Intramuscular   Manufacturer: [place]   Lot: [id number]   [place]: [id number]

## 2019-06-26 MED FILL — ORENCIA 125 MG/ML SYRINGE: 125 | 28 days supply | Qty: 4 | Fill #2

## 2019-06-27 ENCOUNTER — Ambulatory Visit: Payer: Medicare Other | Admitting: Rheumatology

## 2019-07-11 ENCOUNTER — Other Ambulatory Visit: Payer: Medicare Other

## 2019-07-11 ENCOUNTER — Ambulatory Visit: Payer: Medicare Other | Attending: Internal Medicine

## 2019-07-11 ENCOUNTER — Ambulatory Visit: Payer: Medicare Other | Admitting: Rheumatology

## 2019-07-11 DIAGNOSIS — Z23 Encounter for immunization: Secondary | ICD-10-CM | POA: Insufficient documentation

## 2019-07-11 NOTE — Progress Notes (Signed)
   Covid-19 Vaccination Clinic  Name:  Felicia Gonzalez    MRN: [id number] DOB: Oct 16, 1940  07/11/2019  [name] was observed post Covid-19 immunization for 15 minutes without incidence. She was provided with Vaccine Information Sheet and instruction to access the V-Safe system.   [name] was instructed to call 911 with any severe reactions post vaccine: [name] Difficulty breathing  . Swelling of your face and throat  . A fast heartbeat  . A bad rash all over your body  . Dizziness and weakness    Immunizations Administered    Name Date Dose VIS Date Route   Pfizer COVID-19 Vaccine 07/11/2019 12:34 PM 0.3 mL 05/11/2019 Intramuscular   Manufacturer: [place]   Lot: [id number]   [place]: [id number]

## 2019-07-16 ENCOUNTER — Other Ambulatory Visit: Payer: Self-pay | Admitting: Family Medicine

## 2019-07-16 DIAGNOSIS — Z853 Personal history of malignant neoplasm of breast: Secondary | ICD-10-CM

## 2019-07-23 ENCOUNTER — Other Ambulatory Visit: Payer: Self-pay | Admitting: Rheumatology

## 2019-07-23 DIAGNOSIS — Z9225 Personal history of immunosupression therapy: Secondary | ICD-10-CM

## 2019-07-23 DIAGNOSIS — R7612 Nonspecific reaction to cell mediated immunity measurement of gamma interferon antigen response without active tuberculosis: Secondary | ICD-10-CM

## 2019-07-23 NOTE — Telephone Encounter (Signed)
Patient advised she is due to update her chest x-ray. Order placed.

## 2019-07-23 NOTE — Telephone Encounter (Signed)
She will require yearly CXR due to history of TB.  Please notify patient that she is due to update CXR and place future order.  Ok to refill 30 day supply of Orencia.

## 2019-07-23 NOTE — Telephone Encounter (Signed)
Last Visit:01/24/2019 Next Visit:08/01/2019 Labs: 04/11/19 RBC 3.72, hemoglobin 10.5, HCT 33.1, albumin 3.4, total bilirubin 0.2. Chest xray: 07/25/2018 normal.  Patient to update labs at her upcoming visit on 08/01/19  Okay to refill 30 day supply Orencia?

## 2019-07-25 ENCOUNTER — Other Ambulatory Visit: Payer: Self-pay

## 2019-07-25 ENCOUNTER — Ambulatory Visit (HOSPITAL_COMMUNITY)
Admission: RE | Admit: 2019-07-25 | Discharge: 2019-07-25 | Disposition: A | Discharge status: Home / Self Care | Payer: Medicare Other | Source: Ambulatory Visit | Attending: Rheumatology | Admitting: Rheumatology

## 2019-07-25 DIAGNOSIS — R7612 Nonspecific reaction to cell mediated immunity measurement of gamma interferon antigen response without active tuberculosis: Secondary | ICD-10-CM

## 2019-07-25 DIAGNOSIS — Z9225 Personal history of immunosupression therapy: Secondary | ICD-10-CM | POA: Insufficient documentation

## 2019-07-25 MED FILL — ORENCIA 125 MG/ML SYRINGE: 125 | 28 days supply | Qty: 4 | Fill #0

## 2019-07-26 ENCOUNTER — Other Ambulatory Visit: Payer: Self-pay | Admitting: Hematology

## 2019-07-26 DIAGNOSIS — Z853 Personal history of malignant neoplasm of breast: Secondary | ICD-10-CM

## 2019-07-30 ENCOUNTER — Ambulatory Visit
Admission: RE | Admit: 2019-07-30 | Discharge: 2019-07-30 | Disposition: A | Discharge status: Home / Self Care | Payer: Medicare Other | Source: Ambulatory Visit | Attending: Family Medicine | Admitting: Family Medicine

## 2019-07-30 ENCOUNTER — Ambulatory Visit
Admission: RE | Admit: 2019-07-30 | Discharge: 2019-07-30 | Disposition: A | Discharge status: Home / Self Care | Payer: Medicare Other | Source: Ambulatory Visit | Attending: Hematology | Admitting: Hematology

## 2019-07-30 ENCOUNTER — Other Ambulatory Visit: Payer: Self-pay | Admitting: Hematology

## 2019-07-30 ENCOUNTER — Other Ambulatory Visit: Payer: Self-pay

## 2019-07-30 DIAGNOSIS — N6459 Other signs and symptoms in breast: Secondary | ICD-10-CM

## 2019-07-30 DIAGNOSIS — Z853 Personal history of malignant neoplasm of breast: Secondary | ICD-10-CM

## 2019-07-30 NOTE — Progress Notes (Signed)
Office Visit Note  Patient: Felicia Gonzalez             Date of Birth: [birth year]           MRN: [id number]             PCP: Vernie Shanks, MD Referring: Vernie Shanks, MD Visit Date: 08/01/2019 Occupation: '@GUAROCC'$ @  Subjective:  Pain in bilateral arms.   History of Present Illness: Felicia Gonzalez is a 79 y.o. female with history of rheumatoid arthritis, osteoarthritis and osteoporosis.  She states she has tapered her prednisone from 5 mg to 2.5 mg p.o. daily.  She has been taking Orencia subcu weekly and Areva 20 mg p.o. daily.  She has been experiencing increased discomfort in her bilateral arms.  She states she has had lot of stress recently which has been causing increased pain and insomnia.  Activities of Daily Living:  Patient reports morning stiffness for 0 minutes.   Patient Reports nocturnal pain.  Difficulty dressing/grooming: Denies Difficulty climbing stairs: Denies Difficulty getting out of chair: Denies Difficulty using hands for taps, buttons, cutlery, and/or writing: Denies  Review of Systems  Constitutional: Positive for fatigue. Negative for night sweats, weight gain and weight loss.  HENT: Negative for mouth sores, trouble swallowing, trouble swallowing, mouth dryness and nose dryness.   Eyes: Negative for pain, redness, itching, visual disturbance and dryness.  Respiratory: Negative for cough, shortness of breath and difficulty breathing.   Cardiovascular: Negative for chest pain, palpitations, hypertension, irregular heartbeat and swelling in legs/feet.  Gastrointestinal: Positive for constipation. Negative for blood in stool and diarrhea.  Endocrine: Negative for increased urination.  Genitourinary: Negative for difficulty urinating, painful urination and vaginal dryness.  Musculoskeletal: Positive for arthralgias and joint pain. Negative for joint swelling, myalgias, muscle weakness, morning stiffness, muscle tenderness and myalgias.  Skin: Negative for  color change, rash, hair loss, skin tightness, ulcers and sensitivity to sunlight.  Allergic/Immunologic: Negative for susceptible to infections.  Neurological: Negative for dizziness, headaches, memory loss, night sweats and weakness.  Hematological: Negative for swollen glands.  Psychiatric/Behavioral: Negative for depressed mood, confusion and sleep disturbance. The patient is not nervous/anxious.     PMFS History:  Patient Active Problem List   Diagnosis Date Noted  . Latent tuberculosis by blood test 08/09/2018  . Malignant neoplasm of lower-inner quadrant of left breast in female, estrogen receptor positive ([place]) 10/04/2016  . Osteoporosis 10/04/2016  . High risk medication use 06/08/2016  . Age-related osteoporosis without current pathological fracture 06/08/2016  . ANA positive 06/08/2016  . Moderate persistent asthma 06/08/2016  . Normocytic anemia 10/01/2014  . Rheumatoid arthritis of multiple sites without organ or system involvement with positive rheumatoid factor ([place]) 10/01/2014  . Subcapital fracture of left hip ([place]) 10/01/2014    Past Medical History:  Diagnosis Date  . Anemia   . Arthritis    rheumatoid +CCP neg RF +ANA synovitis   . Asthma   . Breast cancer ([place]) 2018   Left Breast Cancer  . Cancer [facility])    Breast cancer  . Glaucoma    right eye  . Osteoporosis   . Personal history of radiation therapy 09/2016   Left Breast Cancer    Family History  Problem Relation Age of Onset  . Hypertension Father    Past Surgical History:  Procedure Laterality Date  . BREAST BIOPSY Left 05/14/2016   x2  . BREAST BIOPSY Right 05/14/2016  . BREAST LUMPECTOMY Left 07/09/2016  .  BREAST LUMPECTOMY WITH RADIOACTIVE SEED AND SENTINEL LYMPH NODE BIOPSY Left 07/09/2016   Procedure: BREAST LUMPECTOMY WITH RADIOACTIVE SEED AND SENTINEL LYMPH NODE BIOPSY;  Surgeon: Coralie Keens, MD;  Location: [facility];  Service: General;  Laterality: Left;  . NO PAST SURGERIES    . TOTAL  HIP ARTHROPLASTY Left 10/01/2014   Procedure: TOTAL HIP ARTHROPLASTY;  Surgeon: Garald Balding, MD;  Location: [facility];  Service: Orthopedics;  Laterality: Left;   Social History   Social History Narrative  . Not on file   Immunization History  Administered Date(s) Administered  . PFIZER SARS-COV-2 Vaccination 06/20/2019, 07/11/2019     Objective: Vital Signs: BP (!) 151/92 (BP Location: Right Arm, Patient Position: Sitting, Cuff Size: Normal)   Pulse (!) 101   Resp 13   Ht 5' 0.5" (1.537 m)   Wt 146 lb 9.6 oz (66.5 kg)   BMI 28.16 kg/m    Physical Exam Vitals and nursing note reviewed.  Constitutional:      Appearance: She is well-developed.  HENT:     Head: Normocephalic and atraumatic.  Eyes:     Conjunctiva/sclera: Conjunctivae normal.  Cardiovascular:     Rate and Rhythm: Normal rate and regular rhythm.     Heart sounds: Normal heart sounds.  Pulmonary:     Effort: Pulmonary effort is normal.     Breath sounds: Normal breath sounds.  Abdominal:     General: Bowel sounds are normal.     Palpations: Abdomen is soft.  Musculoskeletal:     Cervical back: Normal range of motion.  Lymphadenopathy:     Cervical: No cervical adenopathy.  Skin:    General: Skin is warm and dry.     Capillary Refill: Capillary refill takes less than 2 seconds.  Neurological:     Mental Status: She is alert and oriented to person, place, and time.  Psychiatric:        Behavior: Behavior normal.      Musculoskeletal Exam: C-spine good range of motion.  She is some thoracic kyphosis.  Shoulder joints, elbow joints, wrist joints, MCPs PIPs been good range of motion with no synovitis.  She has some mild contractures in her PIPs.  Hip joints, knee joints, ankles were in good range of motion with no synovitis.  CDAI Exam: CDAI Score: 0.4  Patient Global: 2 mm; Provider Global: 2 mm Swollen: 0 ; Tender: 0  Joint Exam 08/01/2019   No joint exam has been documented for this visit    There is currently no information documented on the homunculus. Go to the Rheumatology activity and complete the homunculus joint exam.  Investigation: No additional findings.  Imaging: DG Chest 2 View  Result Date: 07/26/2019 CLINICAL DATA:  Positive TB test. History of immunosuppression. Shortness of breath. EXAM: CHEST - 2 VIEW COMPARISON:  07/25/2018 FINDINGS: The heart size and pulmonary vascularity normal. Aortic atherosclerosis. Lungs are clear. No effusions. Previous left breast surgery. Chronic calcification deep to the right scapula, unchanged. This has been previously evaluated and biopsied in 2016. IMPRESSION: No acute disease. Electronically Signed   By: Lorriane Shire M.D.   On: 07/26/2019 10:37   US BREAST LTD UNI LEFT INC AXILLA  Addendum Date: 07/31/2019   ADDENDUM REPORT: 07/31/2019 11:18 ADDENDUM: Surgical consultation has been arranged with Dr. Nedra Hai at [facility] on August 20, 2019. Stacie Acres RN on 07/31/2019. Electronically Signed   By: Everlean Alstrom M.D.   On: 07/31/2019 11:18   Result Date:  07/31/2019 CLINICAL DATA:  79 year old female with history of left breast cancer post lumpectomy 07/09/2016. EXAM: DIGITAL DIAGNOSTIC BILATERAL MAMMOGRAM WITH CAD AND TOMO LEFT BREAST ULTRASOUND COMPARISON:  Previous exam(s). ACR Breast Density Category c: The breast tissue is heterogeneously dense, which may obscure small masses. FINDINGS: No suspicious masses or calcifications are seen in the right breast. Lumpectomy changes are again identified within the lower inner left breast which are overall stable in appearance, however there is skin thickening involving the left breast in the region of the lower areola, new/increased when compared to the prior exams. Mammographic images were processed with CAD. Physical examination of the left breast reveals puffiness/thickening involving the lower areola. Targeted ultrasound of the periareolar/areolar left breast was  performed. There is mild to moderate skin thickening most notable at the 6 o'clock areolar region with associated increased vascularity. No suspicious masses identified within the subjacent breast, only expected postsurgical change/scarring identified. IMPRESSION: 1. Stable appearance of the left breast lumpectomy site, however there is new/increased skin thickening involving the lower areolar region with associated increased vascularity. Findings could be related to edema in this location, however this is a new finding and surgical referral for further evaluation, possible skin punch biopsy and/or breast MRI is recommended. 2.  No mammographic evidence of malignancy in the right breast. RECOMMENDATION: Surgical consultation for further evaluation of the new/increased lower areola skin thickening. This will be arranged by the breast center nurse navigator. I have discussed the findings and recommendations with the patient. If applicable, a reminder letter will be sent to the patient regarding the next appointment. BI-RADS CATEGORY  4: Suspicious. Electronically Signed: By: Everlean Alstrom M.D. On: 07/30/2019 16:54   MM DIAG BREAST TOMO BILATERAL  Addendum Date: 07/31/2019   ADDENDUM REPORT: 07/31/2019 11:18 ADDENDUM: Surgical consultation has been arranged with Dr. Nedra Hai at [facility] on August 20, 2019. Stacie Acres RN on 07/31/2019. Electronically Signed   By: Everlean Alstrom M.D.   On: 07/31/2019 11:18   Result Date: 07/31/2019 CLINICAL DATA:  79 year old female with history of left breast cancer post lumpectomy 07/09/2016. EXAM: DIGITAL DIAGNOSTIC BILATERAL MAMMOGRAM WITH CAD AND TOMO LEFT BREAST ULTRASOUND COMPARISON:  Previous exam(s). ACR Breast Density Category c: The breast tissue is heterogeneously dense, which may obscure small masses. FINDINGS: No suspicious masses or calcifications are seen in the right breast. Lumpectomy changes are again identified within the lower inner left  breast which are overall stable in appearance, however there is skin thickening involving the left breast in the region of the lower areola, new/increased when compared to the prior exams. Mammographic images were processed with CAD. Physical examination of the left breast reveals puffiness/thickening involving the lower areola. Targeted ultrasound of the periareolar/areolar left breast was performed. There is mild to moderate skin thickening most notable at the 6 o'clock areolar region with associated increased vascularity. No suspicious masses identified within the subjacent breast, only expected postsurgical change/scarring identified. IMPRESSION: 1. Stable appearance of the left breast lumpectomy site, however there is new/increased skin thickening involving the lower areolar region with associated increased vascularity. Findings could be related to edema in this location, however this is a new finding and surgical referral for further evaluation, possible skin punch biopsy and/or breast MRI is recommended. 2.  No mammographic evidence of malignancy in the right breast. RECOMMENDATION: Surgical consultation for further evaluation of the new/increased lower areola skin thickening. This will be arranged by the breast center nurse navigator. I have discussed the findings and  recommendations with the patient. If applicable, a reminder letter will be sent to the patient regarding the next appointment. BI-RADS CATEGORY  4: Suspicious. Electronically Signed: By: Everlean Alstrom M.D. On: 07/30/2019 16:54    Recent Labs: Lab Results  Component Value Date   WBC 4.9 04/11/2019   HGB 10.5 (L) 04/11/2019   PLT 257 04/11/2019   NA 138 04/11/2019   K 3.7 04/11/2019   CL 100 04/11/2019   CO2 30 04/11/2019   GLUCOSE 90 04/11/2019   BUN 13 04/11/2019   CREATININE 0.77 04/11/2019   BILITOT 0.2 (L) 04/11/2019   ALKPHOS 85 04/11/2019   AST 36 04/11/2019   ALT 34 04/11/2019   PROT 6.9 04/11/2019   ALBUMIN 3.4  (L) 04/11/2019   CALCIUM 9.3 04/11/2019   GFRAA >60 04/11/2019   QFTBGOLD Negative 03/10/2016   QFTBGOLDPLUS POSITIVE (A) 07/21/2018    Speciality Comments: Orencia IV every 4 weeks  Procedures:  No procedures performed Allergies: Penicillins and Sulfa antibiotics   Assessment / Plan:     Visit Diagnoses: Rheumatoid arthritis of multiple sites without organ or system involvement with positive rheumatoid factor (HCC) - Positive RF, positive anti-CCP, positive ANA, elevated ESR.  Patient had no synovitis on examination.  She is doing very well on current combination.  She complains of bilateral arm pain which appears to be muscular.  I have advised her to try magnesium malate to 50 mg at bedtime.  High risk medication use - Orencia 125 mg subq every 7 days (resumed May 2020), Arava 20 mg 1 tablet daily, and prednisone 2.5 mg 1 tablet daily.  She states she is not ready to taper prednisone at this time.  I discussed the option of decreasing prednisone to every other day if tolerated.  History of positive TB gold and treated - Plan: CBC with Differential/Platelet, COMPLETE METABOLIC PANEL WITH GFR  Positive QuantiFERON-TB Gold test - treated for latent TB with isoniazid 900 mg + rifapentine 900 mg weekly x 12 weeks.  She had chest x-ray on February 2021 which was within normal limits.  ANA positive-she has no clinical features of any other autoimmune disease besides rheumatoid arthritis.  Age-related osteoporosis without current pathological fracture - Prolia 60 mg every 6 months managed by her oncologist. Evista for breast cancer. previously treated with Fosamax for 2 years.   Malignant neoplasm of upper-inner quadrant of left breast in female, estrogen receptor positive ([place]) - followed up by oncology.  Chronic constipation-she will try magnesium and modify diet rich in fiber.  History of asthma  History of anemia  Orders: Orders Placed This Encounter  Procedures  . CBC with  Differential/Platelet  . COMPLETE METABOLIC PANEL WITH GFR   No orders of the defined types were placed in this encounter.   Face-to-face time spent with patient was 30 minutes. Greater than 50% of time was spent in counseling and coordination of care.  Follow-Up Instructions: Return in about 5 months (around 01/01/2020) for Rheumatoid arthritis, Osteoarthritis, Osteoporosis.   Bo Merino, MD  Note - This record has been created using Editor, commissioning.  Chart creation errors have been sought, but may not always  have been located. Such creation errors do not reflect on  the standard of medical care.

## 2019-08-01 ENCOUNTER — Encounter: Payer: Self-pay | Admitting: Rheumatology

## 2019-08-01 ENCOUNTER — Other Ambulatory Visit: Payer: Self-pay

## 2019-08-01 ENCOUNTER — Ambulatory Visit (INDEPENDENT_AMBULATORY_CARE_PROVIDER_SITE_OTHER): Payer: Medicare Other | Admitting: Rheumatology

## 2019-08-01 VITALS — BP 151/92 | HR 101 | Resp 13 | Ht 60.5 in | Wt 146.6 lb

## 2019-08-01 DIAGNOSIS — K5909 Other constipation: Secondary | ICD-10-CM

## 2019-08-01 DIAGNOSIS — Z8709 Personal history of other diseases of the respiratory system: Secondary | ICD-10-CM

## 2019-08-01 DIAGNOSIS — Z170 Estrogen receptor positive status [ER+]: Secondary | ICD-10-CM

## 2019-08-01 DIAGNOSIS — M810 Age-related osteoporosis without current pathological fracture: Secondary | ICD-10-CM

## 2019-08-01 DIAGNOSIS — R7612 Nonspecific reaction to cell mediated immunity measurement of gamma interferon antigen response without active tuberculosis: Secondary | ICD-10-CM

## 2019-08-01 DIAGNOSIS — Z79899 Other long term (current) drug therapy: Secondary | ICD-10-CM

## 2019-08-01 DIAGNOSIS — R768 Other specified abnormal immunological findings in serum: Secondary | ICD-10-CM

## 2019-08-01 DIAGNOSIS — M0579 Rheumatoid arthritis with rheumatoid factor of multiple sites without organ or systems involvement: Secondary | ICD-10-CM | POA: Diagnosis not present

## 2019-08-01 DIAGNOSIS — C50212 Malignant neoplasm of upper-inner quadrant of left female breast: Secondary | ICD-10-CM

## 2019-08-01 DIAGNOSIS — Z862 Personal history of diseases of the blood and blood-forming organs and certain disorders involving the immune mechanism: Secondary | ICD-10-CM

## 2019-08-01 LAB — COMPLETE METABOLIC PANEL WITH GFR
AG Ratio: 1.4 (calc) (ref 1.0–2.5)
ALT: 25 U/L (ref 6–29)
AST: 26 U/L (ref 10–35)
Albumin: 4.1 g/dL (ref 3.6–5.1)
Alkaline phosphatase (APISO): 76 U/L (ref 37–153)
BUN: 11 mg/dL (ref 7–25)
CO2: 28 mmol/L (ref 20–32)
Calcium: 9.4 mg/dL (ref 8.6–10.4)
Chloride: 101 mmol/L (ref 98–110)
Creat: 0.65 mg/dL (ref 0.60–0.93)
GFR, Est African American: 99 mL/min/{1.73_m2} (ref >=60)
GFR, Est Non African American: 86 mL/min/{1.73_m2} (ref >=60)
Globulin: 2.9 g/dL (calc) (ref 1.9–3.7)
Glucose, Bld: 88 mg/dL (ref 65–99)
Potassium: 4 mmol/L (ref 3.5–5.3)
Sodium: 140 mmol/L (ref 135–146)
Total Bilirubin: 0.3 mg/dL (ref 0.2–1.2)
Total Protein: 7 g/dL (ref 6.1–8.1)

## 2019-08-01 LAB — CBC WITH DIFFERENTIAL/PLATELET
Absolute Monocytes: 774 cells/uL (ref 200–950)
Basophils Absolute: 72 cells/uL (ref 0–200)
Basophils Relative: 1.2 %
Eosinophils Absolute: 180 cells/uL (ref 15–500)
Eosinophils Relative: 3 %
HCT: 33.3 % — ABNORMAL LOW (ref 35.0–45.0)
Hemoglobin: 11 g/dL — ABNORMAL LOW (ref 11.7–15.5)
Lymphs Abs: 1860 cells/uL (ref 850–3900)
MCH: 28.4 pg (ref 27.0–33.0)
MCHC: 33 g/dL (ref 32.0–36.0)
MCV: 86 fL (ref 80.0–100.0)
MPV: 9.9 fL (ref 7.5–12.5)
Monocytes Relative: 12.9 %
Neutro Abs: 3114 cells/uL (ref 1500–7800)
Neutrophils Relative %: 51.9 %
Platelets: 327 10*3/uL (ref 140–400)
RBC: 3.87 10*6/uL (ref 3.80–5.10)
RDW: 14.6 % (ref 11.0–15.0)
Total Lymphocyte: 31 %
WBC: 6 10*3/uL (ref 3.8–10.8)

## 2019-08-01 NOTE — Patient Instructions (Signed)
Standing Labs We placed an order today for your standing lab work.    Please come back and get your standing labs in June and every 3 months.   We have open lab daily Monday through Thursday from 8:30-12:30 PM and 1:30-4:30 PM and Friday from 8:30-12:30 PM and 1:30-4:00 PM at the office of Dr. Rondell Frick.   You may experience shorter wait times on Monday and Friday afternoons. The office is located at [address], [address], [place], [place] [address] No appointment is necessary.   Labs are drawn by [facility].  You may receive a bill from [facility] for your lab work.  If you wish to have your labs drawn at another location, please call the office 24 hours in advance to send orders.  If you have any questions regarding directions or hours of operation,  please call [phone].   Just as a reminder please drink plenty of water prior to coming for your lab work. Thanks!  

## 2019-08-20 ENCOUNTER — Other Ambulatory Visit: Payer: Self-pay | Admitting: Surgery

## 2019-08-20 ENCOUNTER — Other Ambulatory Visit: Payer: Self-pay | Admitting: Rheumatology

## 2019-08-20 NOTE — Telephone Encounter (Signed)
Last Visit: 08/01/19 Next Visit: 01/02/20 Labs: 08/01/19 Hgb and Hct are improving. Rest of CBC WNL. CMP WNL. Chest X-ray 07/25/19 WNL  Current Dose per office note on 08/01/19: Orencia 125 mg subq every 7 days   Okay to refill per Dr. Estanislado Pandy

## 2019-08-22 MED FILL — ORENCIA 125 MG/ML SYRINGE: 125 | 28 days supply | Qty: 4 | Fill #0

## 2019-09-18 MED FILL — ORENCIA 125 MG/ML SYRINGE: 125 | 28 days supply | Qty: 4 | Fill #1

## 2019-09-26 ENCOUNTER — Other Ambulatory Visit: Payer: Self-pay

## 2019-09-26 ENCOUNTER — Ambulatory Visit
Admission: RE | Admit: 2019-09-26 | Discharge: 2019-09-26 | Disposition: A | Discharge status: Home / Self Care | Payer: Medicare Other | Source: Ambulatory Visit | Attending: Hematology | Admitting: Hematology

## 2019-09-26 DIAGNOSIS — Z170 Estrogen receptor positive status [ER+]: Secondary | ICD-10-CM

## 2019-09-26 DIAGNOSIS — C50212 Malignant neoplasm of upper-inner quadrant of left female breast: Secondary | ICD-10-CM

## 2019-09-28 ENCOUNTER — Telehealth: Payer: Self-pay | Admitting: Rheumatology

## 2019-09-28 MED ORDER — PREDNISONE 5 MG PO TABS: 2.5000 mg | ORAL_TABLET | Freq: Every day | ORAL | 0 refills | Status: DC

## 2019-09-28 NOTE — Telephone Encounter (Signed)
Patient calling because 2 weeks ago she was having lots of pain. Prednisone was decreased, and patient is better. Patient has 3 days left of Prednisone. Patient request what to do next. Patient also wants to know if she can get Syngrex vaccine? Please call to advise.

## 2019-09-28 NOTE — Telephone Encounter (Addendum)
patient was taking prednisone 2.5mg  daily until 09/12/2019, patient then started taking prednisone 5mg  daily due to pain/inflammtion. Patient was doing well on prednisone 2.5mg  daily prior to 09/12/2019. Patient would like to know if she should continue on prednisone 5mg  daily or decrease back to 2.5mg ?  Patient also questioned the shringrix vaccine, I advised patient we do recommend the shringrix and that is the one she should receive because it is not live. Patient verbalized understanding.

## 2019-09-28 NOTE — Telephone Encounter (Signed)
Advised patient If she has no longer experiencing increased joint pain and inflammation she can try reducing the dose of prednisone back to 2.5 mg daily. Patient verbalized understanding and requested a refill to be sent in. Prescription sent to [facility] on [address], per patient's request.

## 2019-09-28 NOTE — Telephone Encounter (Signed)
If she has no longer experiencing increased joint pain and inflammation she can try reducing the dose of prednisone back to 2.5 mg daily.

## 2019-10-10 ENCOUNTER — Other Ambulatory Visit: Payer: Self-pay | Admitting: *Deleted

## 2019-10-10 ENCOUNTER — Inpatient Hospital Stay (HOSPITAL_BASED_OUTPATIENT_CLINIC_OR_DEPARTMENT_OTHER): Payer: Medicare Other | Admitting: Hematology

## 2019-10-10 ENCOUNTER — Inpatient Hospital Stay: Payer: Medicare Other | Attending: Hematology

## 2019-10-10 ENCOUNTER — Other Ambulatory Visit: Payer: Self-pay

## 2019-10-10 ENCOUNTER — Inpatient Hospital Stay: Payer: Medicare Other

## 2019-10-10 VITALS — BP 164/85 | HR 92 | Temp 98.7°F | Resp 18 | Wt 146.6 lb

## 2019-10-10 DIAGNOSIS — C50312 Malignant neoplasm of lower-inner quadrant of left female breast: Secondary | ICD-10-CM

## 2019-10-10 DIAGNOSIS — D638 Anemia in other chronic diseases classified elsewhere: Secondary | ICD-10-CM | POA: Insufficient documentation

## 2019-10-10 DIAGNOSIS — D509 Iron deficiency anemia, unspecified: Secondary | ICD-10-CM

## 2019-10-10 DIAGNOSIS — Z170 Estrogen receptor positive status [ER+]: Secondary | ICD-10-CM

## 2019-10-10 DIAGNOSIS — M069 Rheumatoid arthritis, unspecified: Secondary | ICD-10-CM | POA: Diagnosis not present

## 2019-10-10 DIAGNOSIS — M810 Age-related osteoporosis without current pathological fracture: Secondary | ICD-10-CM

## 2019-10-10 DIAGNOSIS — Z853 Personal history of malignant neoplasm of breast: Secondary | ICD-10-CM | POA: Insufficient documentation

## 2019-10-10 LAB — CMP (CANCER CENTER ONLY)
ALT: 36 U/L (ref 0–44)
AST: 33 U/L (ref 15–41)
Albumin: 3.3 g/dL — ABNORMAL LOW (ref 3.5–5.0)
Alkaline Phosphatase: 77 U/L (ref 38–126)
Anion gap: 9 (ref 5–15)
BUN: 13 mg/dL (ref 8–23)
CO2: 29 mmol/L (ref 22–32)
Calcium: 9.1 mg/dL (ref 8.9–10.3)
Chloride: 101 mmol/L (ref 98–111)
Creatinine: 0.76 mg/dL (ref 0.44–1.00)
GFR, Est AFR Am: >60 mL/min (ref >60)
GFR, Estimated: >60 mL/min (ref >60)
Glucose, Bld: 96 mg/dL (ref 70–99)
Potassium: 4 mmol/L (ref 3.5–5.1)
Sodium: 139 mmol/L (ref 135–145)
Total Bilirubin: 0.3 mg/dL (ref 0.3–1.2)
Total Protein: 6.4 g/dL — ABNORMAL LOW (ref 6.5–8.1)

## 2019-10-10 LAB — CBC WITH DIFFERENTIAL (CANCER CENTER ONLY)
Abs Immature Granulocytes: 0.01 10*3/uL (ref 0.00–0.07)
Basophils Absolute: 0.1 10*3/uL (ref 0.0–0.1)
Basophils Relative: 1 %
Eosinophils Absolute: 0.2 10*3/uL (ref 0.0–0.5)
Eosinophils Relative: 3 %
HCT: 32.8 % — ABNORMAL LOW (ref 36.0–46.0)
Hemoglobin: 10.3 g/dL — ABNORMAL LOW (ref 12.0–15.0)
Immature Granulocytes: 0 %
Lymphocytes Relative: 27 %
Lymphs Abs: 1.7 10*3/uL (ref 0.7–4.0)
MCH: 27.9 pg (ref 26.0–34.0)
MCHC: 31.4 g/dL (ref 30.0–36.0)
MCV: 88.9 fL (ref 80.0–100.0)
Monocytes Absolute: 0.8 10*3/uL (ref 0.1–1.0)
Monocytes Relative: 13 %
Neutro Abs: 3.5 10*3/uL (ref 1.7–7.7)
Neutrophils Relative %: 56 %
Platelet Count: 256 10*3/uL (ref 150–400)
RBC: 3.69 MIL/uL — ABNORMAL LOW (ref 3.87–5.11)
RDW: 16.3 % — ABNORMAL HIGH (ref 11.5–15.5)
WBC Count: 6.3 10*3/uL (ref 4.0–10.5)
nRBC: 0 % (ref 0.0–0.2)

## 2019-10-10 LAB — IRON AND TIBC
Iron: 113 ug/dL (ref 41–142)
Saturation Ratios: 38 % (ref 21–57)
TIBC: 296 ug/dL (ref 236–444)
UIBC: 183 ug/dL (ref 120–384)

## 2019-10-10 LAB — FERRITIN: Ferritin: 139 ng/mL (ref 11–307)

## 2019-10-10 MED ORDER — DENOSUMAB 60 MG/ML ~~LOC~~ SOSY
60.0000 mg | PREFILLED_SYRINGE | Freq: Once | SUBCUTANEOUS | Status: AC
  Administered 2019-10-10: 60 mg via SUBCUTANEOUS

## 2019-10-10 MED ORDER — DENOSUMAB 60 MG/ML ~~LOC~~ SOSY
PREFILLED_SYRINGE | SUBCUTANEOUS | Status: AC
  Filled 2019-10-10: qty 1

## 2019-10-10 NOTE — Patient Instructions (Signed)
Denosumab injection What is this medicine? DENOSUMAB (den oh sue mab) slows bone breakdown. Prolia is used to treat osteoporosis in women after menopause and in men, and in people who are taking corticosteroids for 6 months or more. Xgeva is used to treat a high calcium level due to cancer and to prevent bone fractures and other bone problems caused by multiple myeloma or cancer bone metastases. Xgeva is also used to treat giant cell tumor of the bone. This medicine may be used for other purposes; ask your health care provider or pharmacist if you have questions. COMMON BRAND NAME(S): Prolia, XGEVA What should I tell my health care provider before I take this medicine? They need to know if you have any of these conditions:  dental disease  having surgery or tooth extraction  infection  kidney disease  low levels of calcium or Vitamin D in the blood  malnutrition  on hemodialysis  skin conditions or sensitivity  thyroid or parathyroid disease  an unusual reaction to denosumab, other medicines, foods, dyes, or preservatives  pregnant or trying to get pregnant  breast-feeding How should I use this medicine? This medicine is for injection under the skin. It is given by a health care professional in a hospital or clinic setting. A special MedGuide will be given to you before each treatment. Be sure to read this information carefully each time. For Prolia, talk to your pediatrician regarding the use of this medicine in children. Special care may be needed. For Xgeva, talk to your pediatrician regarding the use of this medicine in children. While this drug may be prescribed for children as young as 13 years for selected conditions, precautions do apply. Overdosage: If you think you have taken too much of this medicine contact a poison control center or emergency room at once. NOTE: This medicine is only for you. Do not share this medicine with others. What if I miss a dose? It is  important not to miss your dose. Call your doctor or health care professional if you are unable to keep an appointment. What may interact with this medicine? Do not take this medicine with any of the following medications:  other medicines containing denosumab This medicine may also interact with the following medications:  medicines that lower your chance of fighting infection  steroid medicines like prednisone or cortisone This list may not describe all possible interactions. Give your health care provider a list of all the medicines, herbs, non-prescription drugs, or dietary supplements you use. Also tell them if you smoke, drink alcohol, or use illegal drugs. Some items may interact with your medicine. What should I watch for while using this medicine? Visit your doctor or health care professional for regular checks on your progress. Your doctor or health care professional may order blood tests and other tests to see how you are doing. Call your doctor or health care professional for advice if you get a fever, chills or sore throat, or other symptoms of a cold or flu. Do not treat yourself. This drug may decrease your body's ability to fight infection. Try to avoid being around people who are sick. You should make sure you get enough calcium and vitamin D while you are taking this medicine, unless your doctor tells you not to. Discuss the foods you eat and the vitamins you take with your health care professional. See your dentist regularly. Brush and floss your teeth as directed. Before you have any dental work done, tell your dentist you are   receiving this medicine. Do not become pregnant while taking this medicine or for 5 months after stopping it. Talk with your doctor or health care professional about your birth control options while taking this medicine. Women should inform their doctor if they wish to become pregnant or think they might be pregnant. There is a potential for serious side  effects to an unborn child. Talk to your health care professional or pharmacist for more information. What side effects may I notice from receiving this medicine? Side effects that you should report to your doctor or health care professional as soon as possible:  allergic reactions like skin rash, itching or hives, swelling of the face, lips, or tongue  bone pain  breathing problems  dizziness  jaw pain, especially after dental work  redness, blistering, peeling of the skin  signs and symptoms of infection like fever or chills; cough; sore throat; pain or trouble passing urine  signs of low calcium like fast heartbeat, muscle cramps or muscle pain; pain, tingling, numbness in the hands or feet; seizures  unusual bleeding or bruising  unusually weak or tired Side effects that usually do not require medical attention (report to your doctor or health care professional if they continue or are bothersome):  constipation  diarrhea  headache  joint pain  loss of appetite  muscle pain  runny nose  tiredness  upset stomach This list may not describe all possible side effects. Call your doctor for medical advice about side effects. You may report side effects to FDA at 1-800-FDA-1088. Where should I keep my medicine? This medicine is only given in a clinic, doctor's office, or other health care setting and will not be stored at home. NOTE: This sheet is a summary. It may not cover all possible information. If you have questions about this medicine, talk to your doctor, pharmacist, or health care provider.  2020 Elsevier/Gold Standard (2017-09-23 16:10:44)

## 2019-10-10 NOTE — Progress Notes (Signed)
Felicia Kitchen  HEMATOLOGY ONCOLOGY CLINIC NOTE  Date of service: 10/10/19   Patient Care Team: Vernie Shanks, MD as PCP - General (Family Medicine)  Chief Complaint:  F/u for ER/PR positive Her neg Breast cancer  Diagnosis  Stage I (pT1b, pN0, Mx) left sided invasive ductal carcinoma of the breast. 1 cm in size with lymphovascular invasion grade 2/3, Ki-67 15% ER/PR positive HER-2/neu negative. One Sentinel lymph node biopsy negative No DCIS. Severe calcified benign lesions in left breast with PASH (pseudoangiomatous stromal hyperplasia).  Patient is status post left breast lumpectomy by Dr. Ninfa Linden on 07/09/2016.   S/p adjuvant  Left breast RT on 10/05/2016  INTERVAL HISTORY:  [name] is here for her scheduled follow-up for stage I left breast cancer. The patient's last visit with [place] was on 04/11/2019. The pt reports that she is doing well overall.  The pt reports that she has been having significant pain in her hands for the last two weeks. Pt experiences pain most often when cleaning, exercising, or doing other activity. She is currently taking 2.5 mg of Prednisone per day and using 125 mg/ml Orencia injection weekly. Dr. Estanislado Pandy feels that her RA is well-controlled at this time and thinks that her ongoing joint pain may be caused by osteoarthritis. Pt has tried many topical creams for her RA, many of which have caused symptoms or have not improved her pain enough. She has been using 1-2 Tylenol at a time to help control her pain.  She had a mammogram on 03/01 and saw Dr. Ninfa Linden to discuss her results afterward.  Of note since the patient's last visit, pt has had MM Breast ([id number]) completed on 07/30/2019 with results revealing "1. Stable appearance of the left breast lumpectomy site, however there is new/increased skin thickening involving the lower areolar region with associated increased vascularity. Findings could be related to edema in this location, however this is a new  finding and surgical referral for further evaluation, possible skin punch biopsy and/or breast MRI is recommended. 2.  No mammographic evidence of malignancy in the right breast."   Pt has had Bone Density Study ([id number]) completed on 09/26/2019 with results revealing "The BMD measured at Femur Neck is 0.649 g/cm2 with a T-score of 2.8. This patient is considered OSTEOPOROTIC according to [facility]) criteria."  Lab results today (10/10/19) of CBC w/diff and CMP is as follows: all values are WNL except for RBC at 3.69, Hgb at 10.3, HCT at 32.8, RDW at 16.3, Total Protein at 6.4, Albumin at 3.3. 10/10/2019 Iron Panel is as follows: Iron at 113, TIBC at 296, Sat Ratios at 38, UIBC at 183 10/10/2019 Ferritin at 139  On review of systems, pt reports joint pain and denies breast pain/discomfort, breast changes, abdominal pain, leg swelling and any other symptoms.    REVIEW OF SYSTEMS:   A 10+ POINT REVIEW OF SYSTEMS WAS OBTAINED including neurology, dermatology, psychiatry, cardiac, respiratory, lymph, extremities, GI, GU, Musculoskeletal, constitutional, breasts, reproductive, HEENT.  All pertinent positives are noted in the HPI.  All others are negative.  . Past Medical History:  Diagnosis Date  . Anemia   . Arthritis    rheumatoid +CCP neg RF +ANA synovitis   . Asthma   . Breast cancer ([place]) 2018   Left Breast Cancer  . Cancer [facility])    Breast cancer  . Glaucoma    right eye  . Osteoporosis   . Personal history of radiation therapy 09/2016   Left Breast  Cancer     Past Surgical History:  Procedure Laterality Date  . BREAST BIOPSY Left 05/14/2016   x2  . BREAST BIOPSY Right 05/14/2016  . BREAST LUMPECTOMY Left 07/09/2016  . BREAST LUMPECTOMY WITH RADIOACTIVE SEED AND SENTINEL LYMPH NODE BIOPSY Left 07/09/2016   Procedure: BREAST LUMPECTOMY WITH RADIOACTIVE SEED AND SENTINEL LYMPH NODE BIOPSY;  Surgeon: Coralie Keens, MD;  Location: [place];  Service: General;   Laterality: Left;  . NO PAST SURGERIES    . TOTAL HIP ARTHROPLASTY Left 10/01/2014   Procedure: TOTAL HIP ARTHROPLASTY;  Surgeon: Garald Balding, MD;  Location: [place];  Service: Orthopedics;  Laterality: Left;    Social History   Tobacco Use  . Smoking status: Never Smoker  . Smokeless tobacco: Never Used  Substance Use Topics  . Alcohol use: No  . Drug use: Never    ALLERGIES:  is allergic to penicillins and sulfa antibiotics.  MEDICATIONS:  Current Outpatient Medications  Medication Sig Dispense Refill  . acetaminophen (TYLENOL) 650 MG CR tablet Take 1,300 mg by mouth as needed for pain.     Felicia Kitchen albuterol (PROVENTIL HFA;VENTOLIN HFA) 108 (90 Base) MCG/ACT inhaler Inhale 1-2 puffs into the lungs every 6 (six) hours as needed for wheezing or shortness of breath.    Felicia Kitchen amLODipine (NORVASC) 5 MG tablet Take 5 mg by mouth daily.     Felicia Kitchen aspirin EC 81 MG tablet Take 81 mg by mouth daily.    . calcium gluconate 500 MG tablet Take 2 tablets by mouth daily.    . Cholecalciferol (VITAMIN D3) 5000 units TABS Take 5,000 Units by mouth daily after breakfast.    . latanoprost (XALATAN) 0.005 % ophthalmic solution INSTILL 1 DROP INTO BOTH EYES EVERY DAY IN THE EVENING    . leflunomide (ARAVA) 20 MG tablet TAKE 1 TABLET BY MOUTH EVERY DAY 90 tablet 0  . Multiple Vitamin (MULTIVITAMIN WITH MINERALS) TABS tablet Take 1 tablet by mouth daily.    . Omega-3 Fatty Acids (OMEGA-3 FISH OIL) 1200 MG CAPS Take 1,200 mg by mouth 2 (two) times daily.    Felicia Kitchen ORENCIA 125 MG/ML SOSY INJECT 125 MG INTO THE SKIN ONCE A WEEK. 12 mL 0  . Polyethyl Glycol-Propyl Glycol (SYSTANE OP) 1 drop as needed.     . predniSONE (DELTASONE) 5 MG tablet Take 0.5 tablets (2.5 mg total) by mouth daily with breakfast. 45 tablet 0  . Pyridoxine HCl (VITAMIN B-6 PO) Take by mouth daily.    . raloxifene (EVISTA) 60 MG tablet TAKE 1 TABLET BY MOUTH EVERY DAY 90 tablet 3  . saccharomyces boulardii (FLORASTOR) 250 MG capsule Take 250 mg by  mouth 2 (two) times daily.    . TURMERIC PO Take 1 capsule by mouth daily after breakfast.    . vitamin B-12 (CYANOCOBALAMIN) 500 MCG tablet Take 500 mcg by mouth daily.     No current facility-administered medications for this visit.    PHYSICAL EXAMINATION: ECOG PERFORMANCE STATUS: 1 - Symptomatic but completely ambulatory  Vitals:   10/10/19 1427  BP: (!) 164/85  Pulse: 92  Resp: 18  Temp: 98.7 F (37.1 C)  SpO2: 97%    Filed Weights   10/10/19 1427  Weight: 146 lb 9.6 oz (66.5 kg)   Body mass index is 28.16 kg/m.  GENERAL:alert, in no acute distress and comfortable SKIN: no acute rashes, no significant lesions EYES: conjunctiva are pink and non-injected, sclera anicteric OROPHARYNX: MMM, no exudates, no oropharyngeal erythema or ulceration  NECK: supple, no JVD LYMPH:  no palpable lymphadenopathy in the cervical, axillary or inguinal regions LUNGS: clear to auscultation b/l with normal respiratory effort HEART: regular rate & rhythm ABDOMEN:  normoactive bowel sounds , non tender, not distended. No palpable hepatosplenomegaly.  Extremity: no pedal edema PSYCH: alert & oriented x 3 with fluent speech NEURO: no focal motor/sensory deficits   LABORATORY DATA:   I have reviewed the data as listed  . CBC Latest Ref Rng & Units 10/10/2019 08/01/2019 04/11/2019  WBC 4.0 - 10.5 K/uL 6.3 6.0 4.9  Hemoglobin 12.0 - 15.0 g/dL 10.3(L) 11.0(L) 10.5(L)  Hematocrit 36.0 - 46.0 % 32.8(L) 33.3(L) 33.1(L)  Platelets 150 - 400 K/uL 256 327 257   . CMP Latest Ref Rng & Units 10/10/2019 08/01/2019 04/11/2019  Glucose 70 - 99 mg/dL 96 88 90  BUN 8 - 23 mg/dL '13 11 13  '$ Creatinine 0.44 - 1.00 mg/dL 0.76 0.65 0.77  Sodium 135 - 145 mmol/L 139 140 138  Potassium 3.5 - 5.1 mmol/L 4.0 4.0 3.7  Chloride 98 - 111 mmol/L 101 101 100  CO2 22 - 32 mmol/L '29 28 30  '$ Calcium 8.9 - 10.3 mg/dL 9.1 9.4 9.3  Total Protein 6.5 - 8.1 g/dL 6.4(L) 7.0 6.9  Total Bilirubin 0.3 - 1.2 mg/dL 0.3 0.3  0.2(L)  Alkaline Phos 38 - 126 U/L 77 - 85  AST 15 - 41 U/L 33 26 36  ALT 0 - 44 U/L 36 25 34    25 OH vit D - 54.7---> 49.2  . Lab Results  Component Value Date   IRON 113 10/10/2019   TIBC 296 10/10/2019   IRONPCTSAT 38 10/10/2019   (Iron and TIBC)  Lab Results  Component Value Date   FERRITIN 139 10/10/2019   B12- 686        Microscopic Comment 1. BREAST, INVASIVE TUMOR Procedure: Seed localized lumpectomy. Laterality: Left breast. Tumor Size (gross measurement): 1.0 cm Histologic Type: Ductal Grade: II Tubular Differentiation: 3 Nuclear Pleomorphism: 2 Mitotic Count: 1 Ductal Carcinoma in Situ (DCIS): Not identified Extent of Tumor: Confined to breast parenchyma Margins: Invasive carcinoma, distance from closest margin: Focally 0.1 cm to the anterior margin Regional Lymph Nodes: Number of Lymph Nodes Examined: 1 Number of Sentinel Lymph Nodes Examined: 1 Lymph Nodes with Macrometastases: 0 Lymph Nodes with Micrometastases: 0 Lymph Nodes with Isolated Tumor Cells: 0 Breast Prognostic Profile: [id number] Estrogen Receptor: 100%, strong Progesterone Receptor: 5%, strong Her2: No amplification was detected. The ratio was 1.27 1 of 3 FINAL for DANAYE, SOBH B [phone]) Microscopic Comment(continued) Ki-67: 15% Pathologic Stage Classification (pTNM, AJCC 8th Edition): Primary Tumor (pT): pT1b Regional Lymph Nodes (pN): pN0 (JBK:ecj 07/12/2016) Enid Cutter MD Pathologist, Electronic Signature (Case signed 07/12/2016)     CLINICAL DATA:  Followup for probably benign right breast mass and probably benign left breast calcifications.  EXAM: 2D DIGITAL DIAGNOSTIC BILATERAL MAMMOGRAM WITH CAD AND ADJUNCT TOMO  BILATERAL BREAST ULTRASOUND  COMPARISON:  Previous exam(s).  ACR Breast Density Category c: The breast tissue is heterogeneously dense, which may obscure small masses.  FINDINGS: No suspicious masses or calcifications are seen in  the right breast. The oval circumscribed mass in the upper-outer posterior right breast appears unchanged. Spot compression magnification views were performed over the inner left breast demonstrating unchanged 3 mm group of punctate calcifications. There is a mass with irregular margins in the lower inner left breast measuring approximately 7-8 mm.  Mammographic images were processed with CAD.  Physical  examination of the left breast reveals a firm nodule at the 8 o'clock position.  Targeted ultrasound of the left breast was performed demonstrating an irregular hypoechoic mass at 8 o'clock 2 cm from nipple measuring 0.8 x 0.6 x 0.7 cm. This corresponds well with the mass seen at mammography. No lymphadenopathy seen in the left axilla.  Targeted ultrasound of the right breast was performed demonstrating a mixed echogenicity mass at 10 o'clock 8 cm from nipple measuring 1.4 x 0.6 x 1.1 cm. Although this may represent an area of fat necrosis is indeterminate. No lymphadenopathy seen in the right axilla.  IMPRESSION: 1. Suspicious mass in the left breast at the 8 o'clock position.  2. Stable appearance of punctate calcifications in the lower inner left breast.  3. Indeterminate mass in the right breast at 10 o'clock 8 cm from the nipple which cannot be clearly characterized as an area of fat necrosis.  RECOMMENDATION: 1. Recommend ultrasound-guided biopsy of the mass in the left breast at the 8 o'clock position.  2. Recommend stereotactic guided biopsy of the calcifications in the lower inner left breast (given the suspicious mass seen in close proximity to the calcifications).  3. Recommend ultrasound-guided biopsy of the mass in the right breast at 10 o'clock position.  Biopsies are being scheduled for the patient.  I have discussed the findings and recommendations with the patient. Results were also provided in writing at the conclusion of the visit. If  applicable, a reminder letter will be sent to the patient regarding the next appointment.  BI-RADS CATEGORY  4: Suspicious.   Electronically Signed   By: Everlean Alstrom M.D.   On: 05/12/2016 13:56   RADIOGRAPHIC STUDIES: I have personally reviewed the radiological images as listed and agreed with the findings in the report. DG BONE DENSITY (DXA)  Result Date: 09/26/2019 EXAM: DUAL X-RAY ABSORPTIOMETRY (DXA) FOR BONE MINERAL DENSITY IMPRESSION: Referring Physician:  [name] Your patient completed a BMD test using Lunar IDXA DXA system ( analysis version: 16 ) manufactured by [employer]. Technologist: WLS PATIENT: Name: Felicia Gonzalez, Felicia Gonzalez Patient ID: [id number] Birth Date: 1940-09-07 Height: 60.0 in. Sex: Female Measured: 09/26/2019 Weight: 143.8 lbs. Indications: Advanced Age, Albuterol, Breast Cancer History, Estrogen Deficient, History of Fracture (Adult) (V15.51), History of Osteoporosis, Left hip replaced, Long term steroid use, Low Calcium Intake (269.3), Osteoporosis (733), Postmenopausal, Prednisone, Rheumatoid Arthritis (714.0) Fractures: Left Hip Treatments: Evista, Prolia, Vitamin D (E933.5) ASSESSMENT: The BMD measured at Femur Neck is 0.649 g/cm2 with a T-score of -2.8. This patient is considered OSTEOPOROTIC according to [facility]) criteria. The scan quality is limited by patient body habitus. Left femur was excluded due to surgical hardware. Site Region Measured Date Measured Age YA T-score BMD Significant CHANGE AP Spine    L1-L4  09[age]26/2019    77.7         -2.7    0.859 g/cm2 * AP Spine    L1-L4  09/27/2016    74.7         -3.2    0.798 g/cm2 Right Femur Neck   09[age]26/2019    77.7         -2.8    0.649 g/cm2 Right Femur Neck   09/27/2016    74.7         -3.1    0.614 g/cm2 World Health Organization [facility]) criteria for post-menopausal, Caucasian Women: Normal       T-score at or above -1 SD Osteopenia  T-score between -1 and -2.5 SD Osteoporosis T-score  at or below -2.5 SD RECOMMENDATION: 1. All patients should optimize calcium and vitamin D intake. 2. Consider FDA approved medical therapies in postmenopausal women and men aged 79 years and older, based on the following: a. A hip or vertebral (clinical or morphometric) fracture b. T-score < or = -2.5 at the femoral neck or spine after appropriate evaluation to exclude secondary causes c. Low bone mass (T-score between -1.0 and -2.5 at the femoral neck or spine) and a 10 year probability of a hip fracture > or = 3% or a 10 year probability of a major osteoporosis-related fracture > or = 20% based on the [place]-adapted WHO algorithm d. Clinician judgment and/or patient preferences may indicate treatment for people with 10-year fracture probabilities above or below these levels FOLLOW-UP: Patients with diagnosis of osteoporosis or at high risk for fracture should have regular bone mineral density tests. For patients eligible for Medicare, routine testing is allowed once every 2 years. The testing frequency can be increased to one year for patients who have rapidly progressing disease, those who are receiving or discontinuing medical therapy to restore bone mass, or have additional risk factors. I have reviewed this report and agree with the above findings. Mark A. Thornton Papas, M.D. [facility] Radiology Electronically Signed   By: Lavonia Dana M.D.   On: 09/26/2019 20:09    ASSESSMENT & PLAN:   79 y.o.  female with  #1 Stage I (pT1b, pN0, Mx) left sided invasive ductal carcinoma of the breast. 1 cm in size with lymphovascular invasion grade 2/3, Ki-67 15% ER/PR positive HER-2/neu negative. One Sentinel lymph node biopsy negative No DCIS. Severe calcified benign lesions in left breast with PASH (pseudoangiomatous stromal hyperplasia).  Patient had left breast lumpectomy by Dr. Ninfa Linden on 07/09/2016.  She completed her adjuvant radiation therapy on 09/2016  Has been on Raloxifene since then for adjuvant endocrine  therapy. (AI not considered due to severe osteoporosis with fractures)  05/26/18 MM revealed No mammographic evidence of malignancy in either breast. 2. Stable left breast posttreatment changes.  #2 Severe osteoporosis - multiple risk factors including chronic steroid therapy, age, rheumatoid arthritis. Her bone density study shows significant osteoporosis despite being on Fosamax for 2 years. Last vitamin D levels are adequate at 49 She has had an osteoporotic left hip fracture around May 2016.  #3 Rheumatoid arthritis was on Orencia per rheumatology - currently off due to LTBI status Plan -Continue follow-up with Dr. Estanislado Pandy from rheumatology for ongoing treatments.   #4 Anemia of chronic disease due to chronic inflammation from RA  Plan -no indication for additional IV Iron at this time -continue to optimize RA management.  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 Stable appearance of the left breast lumpectomy site, however there is new/increased skin thickening involving the lower areolar region with associated increased vascularity. Findings could be related to edema in this location, however this is a new finding and surgical referral for further evaluation, possible skin punch biopsy and/or breast MRI is recommended. 2.  No mammographic evidence of malignancy in the right breast."  -Discussed 09/26/2019 Bone Density Study ([id number]) which revealed "The BMD measured at Femur Neck is 0.649 g/cm2 with a T-score  of 2.8. This patient is considered OSTEOPOROTIC according to [facility]) criteria." -The pt shows no clinical or lab progression/return of her breast cancer at this time. Will continue watchful  observation.  -Advised pt that her baseline BP should be read first thing in the morning, prior to food or activity -No indication for IV Iron at this time, as Ferritin >100.  -Advised pt that her RA and Orencia are likely contributing to her anemia -Will continue Prolia every 6 months and Vitamin D optimization for bone density improvement -Recommend pt begin Vitamin B12 daily -Continue '81mg'$  aspirin  -Will see the pt back in 6 months with labs   FOLLOW UP: RTC with Dr. Irene Limbo for labs, MD visit, and Prolia injection in 6 months    The total time spent in the appt was 30 minutes and more than 50% was on counseling and direct patient cares.  All of the patient's questions were answered with apparent satisfaction. The patient knows to call the clinic with any problems, questions or concerns.    Sullivan Lone MD [place] AAHIVMS [facility] Hematology/Oncology Physician [employer]  (Office):       [phone] (Work cell):  [phone] (Fax):           [phone]  I, Yevette Edwards, am acting as a scribe for Dr. Sullivan Lone.   .I have reviewed the above documentation for accuracy and completeness, and I agree with the above. [name] MD

## 2019-10-16 MED FILL — ORENCIA 125 MG/ML SYRINGE: 125 | 28 days supply | Qty: 4 | Fill #2

## 2019-11-12 ENCOUNTER — Other Ambulatory Visit: Payer: Self-pay | Admitting: Rheumatology

## 2019-11-12 NOTE — Telephone Encounter (Signed)
Last Visit: 08/01/19 Next Visit: 01/02/20 Labs:5/12/2021Total protein 6.4, Albumin 3.3, RBC 3.69, Hgb 10.3, Hct 32.8, RDW 16.3 Chest X-ray 07/25/19 WNL  Current Dose per office note on 08/01/19: Orencia 125 mg subq every 7 days   Okay to refill Orencia?

## 2019-11-12 NOTE — Telephone Encounter (Signed)
Ok to refill Orencia 125 mg sq injections once weekly.

## 2019-11-14 MED FILL — ORENCIA 125 MG/ML SYRINGE: 125 | 28 days supply | Qty: 4 | Fill #0

## 2019-11-18 ENCOUNTER — Other Ambulatory Visit: Payer: Self-pay | Admitting: Rheumatology

## 2019-11-19 NOTE — Telephone Encounter (Signed)
Last Visit: 08/01/19 Next Visit: 01/02/20 Labs:5/12/2021Total protein 6.4, Albumin 3.3, RBC 3.69, Hgb 10.3, Hct 32.8, RDW 16.3  Current Dose per office note on 08/01/2019: Arava 20 mg 1 tablet daily  DX: Rheumatoid arthritis  Okay to refill Arava?

## 2019-12-11 ENCOUNTER — Other Ambulatory Visit: Payer: Self-pay | Admitting: Physician Assistant

## 2019-12-11 NOTE — Telephone Encounter (Signed)
Last Visit: 08/01/19 Next Visit: 01/02/20 Labs:5/12/2021Total protein 6.4, Albumin 3.3, RBC 3.69, Hgb 10.3, Hct 32.8, RDW 16.3 Chest X-ray 07/25/19 WNL  Current Dose per office note on 08/01/19:Orencia 125 mg subq every 7 days  Okay to refill Orencia?

## 2019-12-12 MED FILL — ORENCIA 125 MG/ML SYRINGE: 125 | 28 days supply | Qty: 4 | Fill #0

## 2019-12-14 ENCOUNTER — Other Ambulatory Visit: Payer: Self-pay | Admitting: Rheumatology

## 2019-12-14 ENCOUNTER — Other Ambulatory Visit: Payer: Self-pay | Admitting: *Deleted

## 2019-12-14 MED ORDER — PREDNISONE 5 MG PO TABS: 5.0000 mg | ORAL_TABLET | Freq: Every day | ORAL | 0 refills | Status: DC

## 2019-12-14 MED ORDER — LEFLUNOMIDE 20 MG PO TABS: 20.0000 mg | ORAL_TABLET | Freq: Every day | ORAL | 0 refills | Status: DC

## 2019-12-14 NOTE — Progress Notes (Signed)
Patient called and requested refill on Arava.  She is also having a flare of rheumatoid arthritis and requested refill on prednisone.  Prescription for prednisone 5 mg p.o. daily total 30 tablets were sent. Bo Merino, MD

## 2019-12-14 NOTE — Telephone Encounter (Signed)
Dr. Estanislado Pandy received call from patient stating her husband is in the hospital and is not doing well. Patient states she is having an RA flare due to the stress. Per Dr. Estanislado Pandy send prescription to pharmacy for Prednisone 5 mg by mouth daily # 30 no refills.

## 2019-12-19 NOTE — Progress Notes (Signed)
Office Visit Note  Patient: Felicia Gonzalez             Date of Birth: 20-Dec-1940           MRN: [id number]             PCP: Vernie Shanks, MD Referring: Vernie Shanks, MD Visit Date: 01/02/2020 Occupation: '@GUAROCC'$ @  Subjective:  Medication management   History of Present Illness: Felicia Gonzalez is a 79 y.o. female with history of rheumatoid arthritis and osteoarthritis.  She states her arthritis is quite well controlled.  She has been under a lot of stress since her husband was in intensive care unit and now he is in hospice.  She denies having rheumatoid arthritis flare although she has been taking prednisone 5 mg p.o. daily.  She is planning to reduce it to 2.5 mg p.o. daily soon.  She has been getting [place] on a regular basis.  She states when she had COVID-19 vaccination she stopped both medications and then resume them.  Activities of Daily Living:  Patient reports morning stiffness for 0 none.   Patient Denies nocturnal pain.  Difficulty dressing/grooming: Denies Difficulty climbing stairs: Denies Difficulty getting out of chair: Denies Difficulty using hands for taps, buttons, cutlery, and/or writing: Denies  Review of Systems  Constitutional: Negative for fatigue.  HENT: Negative for mouth dryness.   Eyes: Negative for dryness.  Respiratory: Negative for shortness of breath.   Cardiovascular: Negative for swelling in legs/feet.  Gastrointestinal: Positive for constipation.  Endocrine: Positive for cold intolerance, excessive thirst and increased urination.  Genitourinary: Negative for difficulty urinating.  Musculoskeletal: Negative for arthralgias and joint pain.  Skin: Negative for rash.  Allergic/Immunologic: Negative for susceptible to infections.  Neurological: Negative for weakness.  Hematological: Negative for bruising/bleeding tendency.  Psychiatric/Behavioral: Negative for sleep disturbance.    PMFS History:  Patient Active Problem List    Diagnosis Date Noted  . Latent tuberculosis by blood test 08/09/2018  . Malignant neoplasm of lower-inner quadrant of left breast in female, estrogen receptor positive ([place]) 10/04/2016  . Osteoporosis 10/04/2016  . High risk medication use 06/08/2016  . Age-related osteoporosis without current pathological fracture 06/08/2016  . ANA positive 06/08/2016  . Moderate persistent asthma 06/08/2016  . Normocytic anemia 10/01/2014  . Rheumatoid arthritis of multiple sites without organ or system involvement with positive rheumatoid factor ([place]) 10/01/2014  . Subcapital fracture of left hip ([place]) 10/01/2014    Past Medical History:  Diagnosis Date  . Anemia   . Arthritis    rheumatoid +CCP neg RF +ANA synovitis   . Asthma   . Breast cancer ([place]) 2018   Left Breast Cancer  . Cancer [facility])    Breast cancer  . Glaucoma    right eye  . Osteoporosis   . Personal history of radiation therapy 09/2016   Left Breast Cancer    Family History  Problem Relation Age of Onset  . Hypertension Father    Past Surgical History:  Procedure Laterality Date  . BREAST BIOPSY Left 05/14/2016   x2  . BREAST BIOPSY Right 05/14/2016  . BREAST LUMPECTOMY Left 07/09/2016  . BREAST LUMPECTOMY WITH RADIOACTIVE SEED AND SENTINEL LYMPH NODE BIOPSY Left 07/09/2016   Procedure: BREAST LUMPECTOMY WITH RADIOACTIVE SEED AND SENTINEL LYMPH NODE BIOPSY;  Surgeon: Coralie Keens, MD;  Location: [facility];  Service: General;  Laterality: Left;  . NO PAST SURGERIES    . TOTAL HIP ARTHROPLASTY Left 10/01/2014  Procedure: TOTAL HIP ARTHROPLASTY;  Surgeon: Garald Balding, MD;  Location: [facility];  Service: Orthopedics;  Laterality: Left;   Social History   Social History Narrative  . Not on file   Immunization History  Administered Date(s) Administered  . PFIZER SARS-COV-2 Vaccination 06/20/2019, 07/11/2019     Objective: Vital Signs: BP (!) 153/80 (BP Location: Left Arm, Patient Position: Sitting, Cuff Size: Normal)    Pulse (!) 107   Resp 18   Ht 5' (1.524 m)   Wt 139 lb 12.8 oz (63.4 kg)   BMI 27.30 kg/m    Physical Exam Vitals and nursing note reviewed.  Constitutional:      Appearance: She is well-developed.  HENT:     Head: Normocephalic and atraumatic.  Eyes:     Conjunctiva/sclera: Conjunctivae normal.  Cardiovascular:     Rate and Rhythm: Normal rate and regular rhythm.     Heart sounds: Normal heart sounds.  Pulmonary:     Effort: Pulmonary effort is normal.     Breath sounds: Normal breath sounds.  Abdominal:     General: Bowel sounds are normal.     Palpations: Abdomen is soft.  Musculoskeletal:     Cervical back: Normal range of motion.  Lymphadenopathy:     Cervical: No cervical adenopathy.  Skin:    General: Skin is warm and dry.     Capillary Refill: Capillary refill takes less than 2 seconds.  Neurological:     Mental Status: She is alert and oriented to person, place, and time.  Psychiatric:        Behavior: Behavior normal.      Musculoskeletal Exam: C-spine was in good range of motion.  Shoulder joints, and elbow joints with good range of motion.  She is some limitation range of motion of her wrist joints.  She has thickening of PIP and DIP joints.  No synovitis was noted.  Hip joints, knee joints, ankles with good range of motion.  She had no tenderness across MTPs.  CDAI Exam: CDAI Score: 0.2  Patient Global: 1 mm; Provider Global: 1 mm Swollen: 0 ; Tender: 0  Joint Exam 01/02/2020   No joint exam has been documented for this visit   There is currently no information documented on the homunculus. Go to the Rheumatology activity and complete the homunculus joint exam.  Investigation: No additional findings.  Imaging: XR Foot 2 Views Left  Result Date: 01/02/2020 First MTP, PIP and DIP narrowing was noted.  No erosive changes were noted.  No intertarsal or tibiotalar joint space narrowing was noted.  Juxta-articular osteopenia was noted. Impression: These  findings are consistent with rheumatoid arthritis and osteoarthritis overlap.  XR Foot 2 Views Right  Result Date: 01/02/2020 Juxta-articular osteopenia was noted.  First MTP, PIP and DIP narrowing was noted.  Possible erosion was noted over the fifth proximal phalanx.  Dorsal spurring was noted.  No tibiotalar or subtalar joint space narrowing was noted.  Inferior calcaneal spur was noted. Impression: These findings are consistent with rheumatoid arthritis and osteoarthritis overlap.  XR Hand 2 View Left  Result Date: 01/02/2020 Juxta-articular osteopenia was noted.  PIP and DIP narrowing was noted.  Mild MCP narrowing was noted.  Intercarpal and radiocarpal joint space narrowing was noted.  No erosive changes were noted. Impression: These findings are consistent with rheumatoid arthritis and osteoarthritis overlap.  XR Hand 2 View Right  Result Date: 01/02/2020 Juxta-articular osteopenia was noted.  Narrowing of all MCP joints was noted.  Intercarpal and radiocarpal joint space narrowing was noted.  Cystic changes were noted in the carpal bones.  Erosive change was noted over the ulnar styloid.  CMC PIP and DIP narrowing was noted. Impression: These findings are consistent with rheumatoid arthritis and osteoarthritis overlap.   Recent Labs: Lab Results  Component Value Date   WBC 6.3 10/10/2019   HGB 10.3 (L) 10/10/2019   PLT 256 10/10/2019   NA 139 10/10/2019   K 4.0 10/10/2019   CL 101 10/10/2019   CO2 29 10/10/2019   GLUCOSE 96 10/10/2019   BUN 13 10/10/2019   CREATININE 0.76 10/10/2019   BILITOT 0.3 10/10/2019   ALKPHOS 77 10/10/2019   AST 33 10/10/2019   ALT 36 10/10/2019   PROT 6.4 (L) 10/10/2019   ALBUMIN 3.3 (L) 10/10/2019   CALCIUM 9.1 10/10/2019   GFRAA >60 10/10/2019   QFTBGOLD Negative 03/10/2016   QFTBGOLDPLUS POSITIVE (A) 07/21/2018    Speciality Comments: Orencia IV every 4 weeks  Procedures:  No procedures performed Allergies: Penicillins and Sulfa  antibiotics   Assessment / Plan:     Visit Diagnoses: Rheumatoid arthritis of multiple sites without organ or system involvement with positive rheumatoid factor (HCC) - Positive RF, positive anti-CCP, positive ANA, elevated ESR. -She had no synovitis on examination today.  I obtained x-rays to monitor for disease progression.  I do not have any previous x-rays for comparison today.  Juxta-articular osteopenia and joint space narrowing was noticed as described above.  Plan: XR Hand 2 View Right, XR Hand 2 View Left, XR Foot 2 Views Right, XR Foot 2 Views Left  High risk medication use - Orencia 125 mg subq every 7 days (resumed May 2020), Arava 20 mg 1 tablet daily, and prednisone 2.5 mg 1 tablet daily.   - Plan: CBC with Differential/Platelet, COMPLETE METABOLIC PANEL WITH GFR today and every 3 months.    Positive QuantiFERON-TB Gold test - treated for latent TB with isoniazid 900 mg + rifapentine 900 mg weekly x 12 weeks.  She had chest x-ray on February 2021 which was within normal limits.  ANA positive - No clinical features of autoimmune disease  Age-related osteoporosis without current pathological fracture - DEXA September 26, 2019 T score -2.8.  She is on Prolia subcu by oncology.  Malignant neoplasm of upper-inner quadrant of left breast in female, estrogen receptor positive ([place])  History of asthma  History of anemia  Orders: Orders Placed This Encounter  Procedures  . XR Hand 2 View Right  . XR Hand 2 View Left  . XR Foot 2 Views Right  . XR Foot 2 Views Left  . CBC with Differential/Platelet  . COMPLETE METABOLIC PANEL WITH GFR   No orders of the defined types were placed in this encounter.     Follow-Up Instructions: Return for Rheumatoid arthritis, Osteoarthritis.   Bo Merino, MD  Note - This record has been created using Editor, commissioning.  Chart creation errors have been sought, but may not always  have been located. Such creation errors do not reflect on    the standard of medical care.

## 2019-12-22 ENCOUNTER — Other Ambulatory Visit: Payer: Self-pay | Admitting: Physician Assistant

## 2019-12-24 NOTE — Telephone Encounter (Signed)
Last Visit: 08/01/19 Next Visit: 01/02/20  Current Dose per office note on 08/01/19:prednisone 2.5 mg 1 tablet daily  Okay to refill per Dr. Estanislado Pandy

## 2020-01-02 ENCOUNTER — Ambulatory Visit: Payer: Self-pay

## 2020-01-02 ENCOUNTER — Other Ambulatory Visit: Payer: Self-pay

## 2020-01-02 ENCOUNTER — Ambulatory Visit (INDEPENDENT_AMBULATORY_CARE_PROVIDER_SITE_OTHER): Payer: Medicare Other | Admitting: Rheumatology

## 2020-01-02 ENCOUNTER — Encounter: Payer: Self-pay | Admitting: Rheumatology

## 2020-01-02 VITALS — BP 153/80 | HR 107 | Resp 18 | Ht 60.0 in | Wt 139.8 lb

## 2020-01-02 DIAGNOSIS — M79642 Pain in left hand: Secondary | ICD-10-CM

## 2020-01-02 DIAGNOSIS — M79641 Pain in right hand: Secondary | ICD-10-CM

## 2020-01-02 DIAGNOSIS — M79672 Pain in left foot: Secondary | ICD-10-CM | POA: Diagnosis not present

## 2020-01-02 DIAGNOSIS — C50212 Malignant neoplasm of upper-inner quadrant of left female breast: Secondary | ICD-10-CM

## 2020-01-02 DIAGNOSIS — R7612 Nonspecific reaction to cell mediated immunity measurement of gamma interferon antigen response without active tuberculosis: Secondary | ICD-10-CM

## 2020-01-02 DIAGNOSIS — Z170 Estrogen receptor positive status [ER+]: Secondary | ICD-10-CM

## 2020-01-02 DIAGNOSIS — M810 Age-related osteoporosis without current pathological fracture: Secondary | ICD-10-CM

## 2020-01-02 DIAGNOSIS — Z79899 Other long term (current) drug therapy: Secondary | ICD-10-CM | POA: Diagnosis not present

## 2020-01-02 DIAGNOSIS — M0579 Rheumatoid arthritis with rheumatoid factor of multiple sites without organ or systems involvement: Secondary | ICD-10-CM | POA: Diagnosis not present

## 2020-01-02 DIAGNOSIS — Z8709 Personal history of other diseases of the respiratory system: Secondary | ICD-10-CM

## 2020-01-02 DIAGNOSIS — R768 Other specified abnormal immunological findings in serum: Secondary | ICD-10-CM

## 2020-01-02 DIAGNOSIS — Z862 Personal history of diseases of the blood and blood-forming organs and certain disorders involving the immune mechanism: Secondary | ICD-10-CM

## 2020-01-02 DIAGNOSIS — M79671 Pain in right foot: Secondary | ICD-10-CM | POA: Diagnosis not present

## 2020-01-02 NOTE — Patient Instructions (Addendum)
    COVID-19 vaccine recommendations:   COVID-19 vaccine is recommended for everyone (unless you are allergic to a vaccine component), even if you are on a medication that suppresses your immune system.   If you are on Methotrexate, Cellcept (mycophenolate), Rinvoq, Morrie Sheldon, and Olumiant- hold the medication for 1 week after each vaccine. Hold Methotrexate for 2 weeks after the single dose COVID-19 vaccine.   If you are on Orencia subcutaneous injection - hold medication one week prior to and one week after the first COVID-19 vaccine dose (only).   If you are on Orencia IV infusions- time vaccination administration so that the first COVID-19 vaccination will occur four weeks after the infusion and postpone the subsequent infusion by one week.   If you are on Cyclophosphamide or Rituxan infusions please contact your doctor prior to receiving the COVID-19 vaccine.   Do not take Tylenol or ant anti-inflammatory medications (NSAIDs) 24 hours prior to the COVID-19 vaccination.   There is no direct evidence about the efficacy of the COVID-19 vaccine in individuals who are on medications that suppress the immune system.   Even if you are fully vaccinated, and you are on any medications that suppress your immune system, please continue to wear a mask, maintain at least six feet social distance and practice hand hygiene.   If you develop a COVID-19 infection, please contact your PCP or our office to determine if you need antibody infusion.  We anticipate that a booster vaccine will be available soon for immunosuppressed individuals. Please cal our office before receiving your booster dose to make adjustments to your medication regimen.   Standing Labs We placed an order today for your standing lab work.   Please have your standing labs drawn in November and every 3 months  If possible, please have your labs drawn 2 weeks prior to your appointment so that the provider can discuss your results at  your appointment.  We have open lab daily Monday through Thursday from 8:30-12:30 PM and 1:30-4:30 PM and Friday from 8:30-12:30 PM and 1:30-4:00 PM at the office of Dr. Bo Merino, [facility].   Please be advised, patients with office appointments requiring lab work will take precedents over walk-in lab work.  If possible, please come for your lab work on Monday and Friday afternoons, as you may experience shorter wait times. The office is located at [address] [place], [place], [place] [address] No appointment is necessary.   Labs are drawn by [facility]. Please bring your co-pay at the time of your lab draw.  You may receive a bill from [facility] for your lab work.  If you wish to have your labs drawn at another location, please call the office 24 hours in advance to send orders.  If you have any questions regarding directions or hours of operation,  please call [phone].   As a reminder, please drink plenty of water prior to coming for your lab work. Thanks!

## 2020-01-03 LAB — CBC WITH DIFFERENTIAL/PLATELET
Absolute Monocytes: 707 cells/uL (ref 200–950)
Basophils Absolute: 50 cells/uL (ref 0–200)
Basophils Relative: 0.8 %
Eosinophils Absolute: 99 cells/uL (ref 15–500)
Eosinophils Relative: 1.6 %
HCT: 34.6 % — ABNORMAL LOW (ref 35.0–45.0)
Hemoglobin: 11.1 g/dL — ABNORMAL LOW (ref 11.7–15.5)
Lymphs Abs: 1259 cells/uL (ref 850–3900)
MCH: 27.8 pg (ref 27.0–33.0)
MCHC: 32.1 g/dL (ref 32.0–36.0)
MCV: 86.7 fL (ref 80.0–100.0)
MPV: 10.8 fL (ref 7.5–12.5)
Monocytes Relative: 11.4 %
Neutro Abs: 4086 cells/uL (ref 1500–7800)
Neutrophils Relative %: 65.9 %
Platelets: 273 10*3/uL (ref 140–400)
RBC: 3.99 10*6/uL (ref 3.80–5.10)
RDW: 15.2 % — ABNORMAL HIGH (ref 11.0–15.0)
Total Lymphocyte: 20.3 %
WBC: 6.2 10*3/uL (ref 3.8–10.8)

## 2020-01-03 LAB — COMPLETE METABOLIC PANEL WITH GFR
AG Ratio: 1.5 (calc) (ref 1.0–2.5)
ALT: 21 U/L (ref 6–29)
AST: 24 U/L (ref 10–35)
Albumin: 3.9 g/dL (ref 3.6–5.1)
Alkaline phosphatase (APISO): 65 U/L (ref 37–153)
BUN: 12 mg/dL (ref 7–25)
CO2: 31 mmol/L (ref 20–32)
Calcium: 9.3 mg/dL (ref 8.6–10.4)
Chloride: 101 mmol/L (ref 98–110)
Creat: 0.67 mg/dL (ref 0.60–0.93)
GFR, Est African American: 98 mL/min/{1.73_m2} (ref >=60)
GFR, Est Non African American: 84 mL/min/{1.73_m2} (ref >=60)
Globulin: 2.6 g/dL (calc) (ref 1.9–3.7)
Glucose, Bld: 93 mg/dL (ref 65–99)
Potassium: 4 mmol/L (ref 3.5–5.3)
Sodium: 137 mmol/L (ref 135–146)
Total Bilirubin: 0.3 mg/dL (ref 0.2–1.2)
Total Protein: 6.5 g/dL (ref 6.1–8.1)

## 2020-01-09 ENCOUNTER — Other Ambulatory Visit: Payer: Self-pay | Admitting: Rheumatology

## 2020-01-09 MED FILL — ORENCIA 125 MG/ML SYRINGE: 125 | 28 days supply | Qty: 4 | Fill #0

## 2020-01-25 ENCOUNTER — Other Ambulatory Visit: Payer: Self-pay | Admitting: Rheumatology

## 2020-01-25 MED ORDER — PREDNISONE 2.5 MG PO TABS: 2.5000 mg | ORAL_TABLET | Freq: Every day | ORAL | 0 refills | Status: DC

## 2020-01-25 NOTE — Telephone Encounter (Signed)
Patient left a voicemail stating she needs a prescription of Prednisone and requested the nurse call her back ASAP.

## 2020-01-25 NOTE — Telephone Encounter (Signed)
Patient states her husband passed away almost 2 weeks ago. Patient states she is on Prednisone 5 mg. Patient states she would like to taper to Prednisone 2.5 mg. Please advise.

## 2020-02-05 ENCOUNTER — Other Ambulatory Visit: Payer: Self-pay | Admitting: Rheumatology

## 2020-02-14 MED FILL — ORENCIA 125 MG/ML SYRINGE: 125 | 28 days supply | Qty: 4 | Fill #0

## 2020-02-17 ENCOUNTER — Other Ambulatory Visit: Payer: Self-pay | Admitting: Rheumatology

## 2020-02-18 NOTE — Telephone Encounter (Signed)
Last Visit: 01/02/2020 Next Visit: 06/11/2020  Current Dose per office note on 01/02/2020: prednisone 2.5 mg 1 tablet daily.  Okay to refill per Dr. Estanislado Pandy

## 2020-03-14 ENCOUNTER — Other Ambulatory Visit: Payer: Self-pay | Admitting: Rheumatology

## 2020-03-14 MED ORDER — PREDNISONE 2.5 MG PO TABS: 2.5000 mg | ORAL_TABLET | Freq: Every day | ORAL | 0 refills | Status: DC

## 2020-03-14 NOTE — Telephone Encounter (Signed)
Last Visit: 01/02/2020 Next Visit: 06/11/2020  Current Dose per office note on 01/02/2020: prednisone 2.5 mg 1 tablet daily.  Okay to refill per Dr. Estanislado Pandy

## 2020-03-14 NOTE — Telephone Encounter (Signed)
Last Visit: 01/02/2020 Next Visit: 06/11/2020 Labs: 01/02/2020 Hgb and Hct are mildly low but improving. Rest of CBC WNL. CMP WNL.  Chest X-ray 07/25/2019   Current Dose per office note on 01/02/2020: Orencia 125 mg subq every 7 days   Okay to refill Orencia?

## 2020-03-14 NOTE — Addendum Note (Signed)
Addended by: Carole Binning on: 03/14/2020 11:50 AM   Modules accepted: Orders

## 2020-03-17 ENCOUNTER — Other Ambulatory Visit: Payer: Self-pay | Admitting: Physician Assistant

## 2020-03-18 MED FILL — ORENCIA 125 MG/ML SYRINGE: 125 | 28 days supply | Qty: 4 | Fill #0

## 2020-04-07 ENCOUNTER — Other Ambulatory Visit: Payer: Self-pay

## 2020-04-07 MED ORDER — PREDNISONE 5 MG PO TABS: 5.0000 mg | ORAL_TABLET | Freq: Every day | ORAL | 0 refills | Status: DC

## 2020-04-07 NOTE — Telephone Encounter (Signed)
Patient states she is having increasing all over body pain. Patient states she was advised she has a pinched nerve and sciatica from another physician after she had x-rays. Patient states she is also wearing a patch to help with the back discomfort. Patient states about 1 week ago she increased her Prednisone from 2.5 mg to 5 mg. Patient states that has helped with her pain. Patient states she is also taking Tylenol. Patient would like to know if you will increase her Prednisone to 5 mg. Please advise.

## 2020-04-07 NOTE — Telephone Encounter (Signed)
Patient called stating she started taking 2 tablets of 2.5 mg because her "entire body has been aching."  Patient states after one week of taking 1 tablet of 2.5 mg she was not getting any pain relief.  Patient requested a return call to discuss getting a new prescription.

## 2020-04-07 NOTE — Telephone Encounter (Signed)
Discussed with Dr. Estanislado Pandy.  She is ok with the patient increasing her daily dose of prednisone to 5 mg daily.

## 2020-04-10 MED FILL — ORENCIA 125 MG/ML SYRINGE: 125 | 28 days supply | Qty: 4 | Fill #1

## 2020-04-11 ENCOUNTER — Other Ambulatory Visit: Payer: Medicare Other

## 2020-04-11 ENCOUNTER — Ambulatory Visit: Payer: Medicare Other

## 2020-04-11 ENCOUNTER — Other Ambulatory Visit: Payer: Self-pay | Admitting: *Deleted

## 2020-04-11 ENCOUNTER — Ambulatory Visit: Payer: Medicare Other | Admitting: Hematology

## 2020-04-11 DIAGNOSIS — Z170 Estrogen receptor positive status [ER+]: Secondary | ICD-10-CM

## 2020-04-11 DIAGNOSIS — C50312 Malignant neoplasm of lower-inner quadrant of left female breast: Secondary | ICD-10-CM

## 2020-04-11 DIAGNOSIS — D509 Iron deficiency anemia, unspecified: Secondary | ICD-10-CM

## 2020-04-12 ENCOUNTER — Other Ambulatory Visit: Payer: Self-pay | Admitting: Rheumatology

## 2020-04-14 ENCOUNTER — Inpatient Hospital Stay: Payer: Medicare Other

## 2020-04-14 ENCOUNTER — Inpatient Hospital Stay: Payer: Medicare Other | Attending: Hematology

## 2020-04-14 ENCOUNTER — Inpatient Hospital Stay (HOSPITAL_BASED_OUTPATIENT_CLINIC_OR_DEPARTMENT_OTHER): Payer: Medicare Other | Admitting: Hematology

## 2020-04-14 ENCOUNTER — Other Ambulatory Visit: Payer: Self-pay

## 2020-04-14 VITALS — BP 146/98 | HR 106 | Temp 97.3°F | Resp 18 | Ht 60.0 in | Wt 142.0 lb

## 2020-04-14 DIAGNOSIS — C50512 Malignant neoplasm of lower-outer quadrant of left female breast: Secondary | ICD-10-CM | POA: Diagnosis not present

## 2020-04-14 DIAGNOSIS — C50312 Malignant neoplasm of lower-inner quadrant of left female breast: Secondary | ICD-10-CM | POA: Diagnosis not present

## 2020-04-14 DIAGNOSIS — D509 Iron deficiency anemia, unspecified: Secondary | ICD-10-CM

## 2020-04-14 DIAGNOSIS — Z170 Estrogen receptor positive status [ER+]: Secondary | ICD-10-CM

## 2020-04-14 DIAGNOSIS — M810 Age-related osteoporosis without current pathological fracture: Secondary | ICD-10-CM | POA: Diagnosis not present

## 2020-04-14 LAB — CBC WITH DIFFERENTIAL (CANCER CENTER ONLY)
Abs Immature Granulocytes: 0.01 10*3/uL (ref 0.00–0.07)
Basophils Absolute: 0.1 10*3/uL (ref 0.0–0.1)
Basophils Relative: 1 %
Eosinophils Absolute: 0.1 10*3/uL (ref 0.0–0.5)
Eosinophils Relative: 1 %
HCT: 33.9 % — ABNORMAL LOW (ref 36.0–46.0)
Hemoglobin: 10.6 g/dL — ABNORMAL LOW (ref 12.0–15.0)
Immature Granulocytes: 0 %
Lymphocytes Relative: 25 %
Lymphs Abs: 1.7 10*3/uL (ref 0.7–4.0)
MCH: 28 pg (ref 26.0–34.0)
MCHC: 31.3 g/dL (ref 30.0–36.0)
MCV: 89.4 fL (ref 80.0–100.0)
Monocytes Absolute: 0.9 10*3/uL (ref 0.1–1.0)
Monocytes Relative: 14 %
Neutro Abs: 3.9 10*3/uL (ref 1.7–7.7)
Neutrophils Relative %: 59 %
Platelet Count: 279 10*3/uL (ref 150–400)
RBC: 3.79 MIL/uL — ABNORMAL LOW (ref 3.87–5.11)
RDW: 15.9 % — ABNORMAL HIGH (ref 11.5–15.5)
WBC Count: 6.7 10*3/uL (ref 4.0–10.5)
nRBC: 0 % (ref 0.0–0.2)

## 2020-04-14 LAB — CMP (CANCER CENTER ONLY)
ALT: 34 U/L (ref 0–44)
AST: 30 U/L (ref 15–41)
Albumin: 3.4 g/dL — ABNORMAL LOW (ref 3.5–5.0)
Alkaline Phosphatase: 77 U/L (ref 38–126)
Anion gap: 8 (ref 5–15)
BUN: 14 mg/dL (ref 8–23)
CO2: 29 mmol/L (ref 22–32)
Calcium: 9.2 mg/dL (ref 8.9–10.3)
Chloride: 102 mmol/L (ref 98–111)
Creatinine: 0.75 mg/dL (ref 0.44–1.00)
GFR, Estimated: >60 mL/min (ref >60)
Glucose, Bld: 97 mg/dL (ref 70–99)
Potassium: 4 mmol/L (ref 3.5–5.1)
Sodium: 139 mmol/L (ref 135–145)
Total Bilirubin: 0.4 mg/dL (ref 0.3–1.2)
Total Protein: 7 g/dL (ref 6.5–8.1)

## 2020-04-14 LAB — FERRITIN: Ferritin: 132 ng/mL (ref 11–307)

## 2020-04-14 LAB — IRON AND TIBC
Iron: 99 ug/dL (ref 41–142)
Saturation Ratios: 31 % (ref 21–57)
TIBC: 320 ug/dL (ref 236–444)
UIBC: 221 ug/dL (ref 120–384)

## 2020-04-14 MED ORDER — DENOSUMAB 60 MG/ML ~~LOC~~ SOSY
60.0000 mg | PREFILLED_SYRINGE | Freq: Once | SUBCUTANEOUS | Status: AC
  Administered 2020-04-14: 60 mg via SUBCUTANEOUS

## 2020-04-14 MED ORDER — DENOSUMAB 60 MG/ML ~~LOC~~ SOSY
PREFILLED_SYRINGE | SUBCUTANEOUS | Status: AC
  Filled 2020-04-14: qty 1

## 2020-04-14 NOTE — Patient Instructions (Signed)
Denosumab injection What is this medicine? DENOSUMAB (den oh sue mab) slows bone breakdown. Prolia is used to treat osteoporosis in women after menopause and in men, and in people who are taking corticosteroids for 6 months or more. Xgeva is used to treat a high calcium level due to cancer and to prevent bone fractures and other bone problems caused by multiple myeloma or cancer bone metastases. Xgeva is also used to treat giant cell tumor of the bone. This medicine may be used for other purposes; ask your health care provider or pharmacist if you have questions. COMMON BRAND NAME(S): Prolia, XGEVA What should I tell my health care provider before I take this medicine? They need to know if you have any of these conditions:  dental disease  having surgery or tooth extraction  infection  kidney disease  low levels of calcium or Vitamin D in the blood  malnutrition  on hemodialysis  skin conditions or sensitivity  thyroid or parathyroid disease  an unusual reaction to denosumab, other medicines, foods, dyes, or preservatives  pregnant or trying to get pregnant  breast-feeding How should I use this medicine? This medicine is for injection under the skin. It is given by a health care professional in a hospital or clinic setting. A special MedGuide will be given to you before each treatment. Be sure to read this information carefully each time. For Prolia, talk to your pediatrician regarding the use of this medicine in children. Special care may be needed. For Xgeva, talk to your pediatrician regarding the use of this medicine in children. While this drug may be prescribed for children as young as 13 years for selected conditions, precautions do apply. Overdosage: If you think you have taken too much of this medicine contact a poison control center or emergency room at once. NOTE: This medicine is only for you. Do not share this medicine with others. What if I miss a dose? It is  important not to miss your dose. Call your doctor or health care professional if you are unable to keep an appointment. What may interact with this medicine? Do not take this medicine with any of the following medications:  other medicines containing denosumab This medicine may also interact with the following medications:  medicines that lower your chance of fighting infection  steroid medicines like prednisone or cortisone This list may not describe all possible interactions. Give your health care provider a list of all the medicines, herbs, non-prescription drugs, or dietary supplements you use. Also tell them if you smoke, drink alcohol, or use illegal drugs. Some items may interact with your medicine. What should I watch for while using this medicine? Visit your doctor or health care professional for regular checks on your progress. Your doctor or health care professional may order blood tests and other tests to see how you are doing. Call your doctor or health care professional for advice if you get a fever, chills or sore throat, or other symptoms of a cold or flu. Do not treat yourself. This drug may decrease your body's ability to fight infection. Try to avoid being around people who are sick. You should make sure you get enough calcium and vitamin D while you are taking this medicine, unless your doctor tells you not to. Discuss the foods you eat and the vitamins you take with your health care professional. See your dentist regularly. Brush and floss your teeth as directed. Before you have any dental work done, tell your dentist you are   receiving this medicine. Do not become pregnant while taking this medicine or for 5 months after stopping it. Talk with your doctor or health care professional about your birth control options while taking this medicine. Women should inform their doctor if they wish to become pregnant or think they might be pregnant. There is a potential for serious side  effects to an unborn child. Talk to your health care professional or pharmacist for more information. What side effects may I notice from receiving this medicine? Side effects that you should report to your doctor or health care professional as soon as possible:  allergic reactions like skin rash, itching or hives, swelling of the face, lips, or tongue  bone pain  breathing problems  dizziness  jaw pain, especially after dental work  redness, blistering, peeling of the skin  signs and symptoms of infection like fever or chills; cough; sore throat; pain or trouble passing urine  signs of low calcium like fast heartbeat, muscle cramps or muscle pain; pain, tingling, numbness in the hands or feet; seizures  unusual bleeding or bruising  unusually weak or tired Side effects that usually do not require medical attention (report to your doctor or health care professional if they continue or are bothersome):  constipation  diarrhea  headache  joint pain  loss of appetite  muscle pain  runny nose  tiredness  upset stomach This list may not describe all possible side effects. Call your doctor for medical advice about side effects. You may report side effects to FDA at 1-800-FDA-1088. Where should I keep my medicine? This medicine is only given in a clinic, doctor's office, or other health care setting and will not be stored at home. NOTE: This sheet is a summary. It may not cover all possible information. If you have questions about this medicine, talk to your doctor, pharmacist, or health care provider.  2020 Elsevier/Gold Standard (2017-09-23 16:10:44)

## 2020-04-14 NOTE — Progress Notes (Signed)
HEMATOLOGY ONCOLOGY CLINIC NOTE  Date of service: 04/14/20   Patient Care Team: Vernie Shanks, MD as PCP - General (Family Medicine)  Chief Complaint:  F/u for ER/PR positive Her neg Breast cancer  Diagnosis  Stage I (pT1b, pN0, Mx) left sided invasive ductal carcinoma of the breast. 1 cm in size with lymphovascular invasion grade 2/3, Ki-67 15% ER/PR positive HER-2/neu negative. One Sentinel lymph node biopsy negative No DCIS. Severe calcified benign lesions in left breast with PASH (pseudoangiomatous stromal hyperplasia).  Patient is status post left breast lumpectomy by Dr. Ninfa Linden on 07/09/2016.   S/p adjuvant  Left breast RT on 10/05/2016  INTERVAL HISTORY:  Felicia Gonzalez is here for her scheduled follow-up for stage I left breast cancer. The patient's last visit with [place] was on 10/10/2019. The pt reports that she is doing well overall.  The pt reports she is experiencing some grief after the recent passing of her husband. She is planning on spending time with her other family members and seems to have a good support system. Pt saw her PCP for pain evaluation and the pt was told that she has sciatica. She was recommended to use a TENS unit, but has not started using it yet. Pt has no issues taking Evista at this time. She was concerned about excessive bleeding when taking a daily baby ASA so she discontinued. She has continued 5 mg Prenisone, [place], and [place] as prescribed.  Lab results today (04/14/20) of CBC w/diff and CMP is as follows: all values are WNL except for RBC at 3.79, Hgb at 10.6, HCT at 33.9, RDW at 15.9, Albumin at 3.4. 04/14/2020 Ferritin at 132 04/14/2020 Iron Panel is as follows: all values are WNL.  On review of systems, pt denies breast lumps, breast discomfort, abdominal pain, leg swelling and any other symptoms.   REVIEW OF SYSTEMS:   A 10+ POINT REVIEW OF SYSTEMS WAS OBTAINED including neurology, dermatology, psychiatry, cardiac, respiratory, lymph,  extremities, GI, GU, Musculoskeletal, constitutional, breasts, reproductive, HEENT.  All pertinent positives are noted in the HPI.  All others are negative.  . Past Medical History:  Diagnosis Date  . Anemia   . Arthritis    rheumatoid +CCP neg RF +ANA synovitis   . Asthma   . Breast cancer ([place]) 2018   Left Breast Cancer  . Cancer [facility])    Breast cancer  . Glaucoma    right eye  . Osteoporosis   . Personal history of radiation therapy 09/2016   Left Breast Cancer     Past Surgical History:  Procedure Laterality Date  . BREAST BIOPSY Left 05/14/2016   x2  . BREAST BIOPSY Right 05/14/2016  . BREAST LUMPECTOMY Left 07/09/2016  . BREAST LUMPECTOMY WITH RADIOACTIVE SEED AND SENTINEL LYMPH NODE BIOPSY Left 07/09/2016   Procedure: BREAST LUMPECTOMY WITH RADIOACTIVE SEED AND SENTINEL LYMPH NODE BIOPSY;  Surgeon: Coralie Keens, MD;  Location: [facility];  Service: General;  Laterality: Left;  . NO PAST SURGERIES    . TOTAL HIP ARTHROPLASTY Left 10/01/2014   Procedure: TOTAL HIP ARTHROPLASTY;  Surgeon: Garald Balding, MD;  Location: [facility];  Service: Orthopedics;  Laterality: Left;    Social History   Tobacco Use  . Smoking status: Never Smoker  . Smokeless tobacco: Never Used  Vaping Use  . Vaping Use: Never used  Substance Use Topics  . Alcohol use: No  . Drug use: Never    ALLERGIES:  is allergic to penicillins and sulfa antibiotics.  MEDICATIONS:  Current Outpatient Medications  Medication Sig Dispense Refill  . acetaminophen (TYLENOL) 650 MG CR tablet Take 1,300 mg by mouth as needed for pain.     [name] albuterol (PROVENTIL HFA;VENTOLIN HFA) 108 (90 Base) MCG/ACT inhaler Inhale 1-2 puffs into the lungs every 6 (six) hours as needed for wheezing or shortness of breath.    [name] amLODipine (NORVASC) 5 MG tablet Take 5 mg by mouth daily.     [name] aspirin EC 81 MG tablet Take 81 mg by mouth as needed.     . calcium gluconate 500 MG tablet Take 2 tablets by mouth daily.    .  Cholecalciferol (VITAMIN D3) 5000 units TABS Take 5,000 Units by mouth daily after breakfast.    . latanoprost (XALATAN) 0.005 % ophthalmic solution INSTILL 1 DROP INTO BOTH EYES EVERY DAY IN THE EVENING    . leflunomide (ARAVA) 20 MG tablet Take 1 tablet (20 mg total) by mouth daily. 90 tablet 0  . Multiple Vitamin (MULTIVITAMIN WITH MINERALS) TABS tablet Take 1 tablet by mouth daily.    . Omega-3 Fatty Acids (OMEGA-3 FISH OIL) 1200 MG CAPS Take 1,200 mg by mouth 2 (two) times daily.    [name] ORENCIA 125 MG/ML SOSY INJECT 125 MG INTO THE SKIN ONCE A WEEK. 12 mL 0  . Polyethyl Glycol-Propyl Glycol (SYSTANE OP) 1 drop as needed.     . predniSONE (DELTASONE) 2.5 MG tablet Take 1 tablet (2.5 mg total) by mouth daily with breakfast. 30 tablet 0  . predniSONE (DELTASONE) 5 MG tablet Take 1 tablet (5 mg total) by mouth daily with breakfast. 90 tablet 0  . Pyridoxine HCl (VITAMIN B-6 PO) Take by mouth daily.    . raloxifene (EVISTA) 60 MG tablet TAKE 1 TABLET BY MOUTH EVERY DAY 90 tablet 3  . saccharomyces boulardii (FLORASTOR) 250 MG capsule Take 250 mg by mouth 2 (two) times daily. (Patient not taking: Reported on 01/02/2020)    . TURMERIC PO Take 1 capsule by mouth daily after breakfast.    . vitamin B-12 (CYANOCOBALAMIN) 500 MCG tablet Take 500 mcg by mouth daily.     No current facility-administered medications for this visit.   Facility-Administered Medications Ordered in Other Visits  Medication Dose Route Frequency Provider Last Rate Last Admin  . denosumab (PROLIA) injection 60 mg  60 mg Subcutaneous Once [name], MD        PHYSICAL EXAMINATION: ECOG PERFORMANCE STATUS: 1 - Symptomatic but completely ambulatory  Vitals:   04/14/20 1442  BP: (!) 146/98  Pulse: (!) 106  Resp: 18  Temp: (!) 97.3 F (36.3 C)  SpO2: 99%    Filed Weights   04/14/20 1442  Weight: 142 lb (64.4 kg)   Body mass index is 27.73 kg/m.   GENERAL:alert, in no acute distress and comfortable SKIN:  no acute rashes, no significant lesions EYES: conjunctiva are pink and non-injected, sclera anicteric OROPHARYNX: MMM, no exudates, no oropharyngeal erythema or ulceration NECK: supple, no JVD LYMPH:  no palpable lymphadenopathy in the cervical, axillary or inguinal regions LUNGS: clear to auscultation b/l with normal respiratory effort HEART: regular rate & rhythm ABDOMEN:  normoactive bowel sounds , non tender, not distended. No palpable hepatosplenomegaly.  Extremity: no pedal edema PSYCH: alert & oriented x 3 with fluent speech NEURO: no focal motor/sensory deficits   LABORATORY DATA:   I have reviewed the data as listed  . CBC Latest Ref Rng & Units 04/14/2020 01/02/2020 10/10/2019  WBC 4.0 -  10.5 K/uL 6.7 6.2 6.3  Hemoglobin 12.0 - 15.0 g/dL 10.6(L) 11.1(L) 10.3(L)  Hematocrit 36 - 46 % 33.9(L) 34.6(L) 32.8(L)  Platelets 150 - 400 K/uL 279 273 256   . CMP Latest Ref Rng & Units 04/14/2020 01/02/2020 10/10/2019  Glucose 70 - 99 mg/dL 97 93 96  BUN 8 - 23 mg/dL $Remove'14 12 13  'DmWfSVZ$ Creatinine 0.44 - 1.00 mg/dL 0.75 0.67 0.76  Sodium 135 - 145 mmol/L 139 137 139  Potassium 3.5 - 5.1 mmol/L 4.0 4.0 4.0  Chloride 98 - 111 mmol/L 102 101 101  CO2 22 - 32 mmol/L $RemoveB'29 31 29  'ONRjQvdN$ Calcium 8.9 - 10.3 mg/dL 9.2 9.3 9.1  Total Protein 6.5 - 8.1 g/dL 7.0 6.5 6.4(L)  Total Bilirubin 0.3 - 1.2 mg/dL 0.4 0.3 0.3  Alkaline Phos 38 - 126 U/L 77 - 77  AST 15 - 41 U/L 30 24 33  ALT 0 - 44 U/L 34 21 36    25 OH vit D - 54.7---> 49.2  . Lab Results  Component Value Date   IRON 99 04/14/2020   TIBC 320 04/14/2020   IRONPCTSAT 31 04/14/2020   (Iron and TIBC)  Lab Results  Component Value Date   FERRITIN 132 04/14/2020   [id number]        Microscopic Comment 1. BREAST, INVASIVE TUMOR Procedure: Seed localized lumpectomy. Laterality: Left breast. Tumor Size (gross measurement): 1.0 cm Histologic Type: Ductal Grade: II Tubular Differentiation: 3 Nuclear Pleomorphism: 2 Mitotic Count:  1 Ductal Carcinoma in Situ (DCIS): Not identified Extent of Tumor: Confined to breast parenchyma Margins: Invasive carcinoma, distance from closest margin: Focally 0.1 cm to the anterior margin Regional Lymph Nodes: Number of Lymph Nodes Examined: 1 Number of Sentinel Lymph Nodes Examined: 1 Lymph Nodes with Macrometastases: 0 Lymph Nodes with Micrometastases: 0 Lymph Nodes with Isolated Tumor Cells: 0 Breast Prognostic Profile: [id number] Estrogen Receptor: 100%, strong Progesterone Receptor: 5%, strong Her2: No amplification was detected. The ratio was 1.27 1 of 3 FINAL for Felicia Gonzalez, Felicia Gonzalez B [phone]) Microscopic Comment(continued) Ki-67: 15% Pathologic Stage Classification (pTNM, AJCC 8th Edition): Primary Tumor (pT): pT1b Regional Lymph Nodes (pN): pN0 (JBK:ecj 07/12/2016) Enid Cutter MD Pathologist, Electronic Signature (Case signed 07/12/2016)     CLINICAL DATA:  Followup for probably benign right breast mass and probably benign left breast calcifications.  EXAM: 2D DIGITAL DIAGNOSTIC BILATERAL MAMMOGRAM WITH CAD AND ADJUNCT TOMO  BILATERAL BREAST ULTRASOUND  COMPARISON:  Previous exam(s).  ACR Breast Density Category c: The breast tissue is heterogeneously dense, which may obscure small masses.  FINDINGS: No suspicious masses or calcifications are seen in the right breast. The oval circumscribed mass in the upper-outer posterior right breast appears unchanged. Spot compression magnification views were performed over the inner left breast demonstrating unchanged 3 mm group of punctate calcifications. There is a mass with irregular margins in the lower inner left breast measuring approximately 7-8 mm.  Mammographic images were processed with CAD.  Physical examination of the left breast reveals a firm nodule at the 8 o'clock position.  Targeted ultrasound of the left breast was performed demonstrating an irregular hypoechoic mass at 8 o'clock 2  cm from nipple measuring 0.8 x 0.6 x 0.7 cm. This corresponds well with the mass seen at mammography. No lymphadenopathy seen in the left axilla.  Targeted ultrasound of the right breast was performed demonstrating a mixed echogenicity mass at 10 o'clock 8 cm from nipple measuring 1.4 x 0.6 x 1.1 cm. Although this may represent  an area of fat necrosis is indeterminate. No lymphadenopathy seen in the right axilla.  IMPRESSION: 1. Suspicious mass in the left breast at the 8 o'clock position.  2. Stable appearance of punctate calcifications in the lower inner left breast.  3. Indeterminate mass in the right breast at 10 o'clock 8 cm from the nipple which cannot be clearly characterized as an area of fat necrosis.  RECOMMENDATION: 1. Recommend ultrasound-guided biopsy of the mass in the left breast at the 8 o'clock position.  2. Recommend stereotactic guided biopsy of the calcifications in the lower inner left breast (given the suspicious mass seen in close proximity to the calcifications).  3. Recommend ultrasound-guided biopsy of the mass in the right breast at 10 o'clock position.  Biopsies are being scheduled for the patient.  I have discussed the findings and recommendations with the patient. Results were also provided in writing at the conclusion of the visit. If applicable, a reminder letter will be sent to the patient regarding the next appointment.  BI-RADS CATEGORY  4: Suspicious.   Electronically Signed   By: Everlean Alstrom M.D.   On: 05/12/2016 13:56   RADIOGRAPHIC STUDIES: I have personally reviewed the radiological images as listed and agreed with the findings in the report. No results found.  ASSESSMENT & PLAN:   79 y.o.  female with  #1 Stage I (pT1b, pN0, Mx) left sided invasive ductal carcinoma of the breast. 1 cm in size with lymphovascular invasion grade 2/3, Ki-67 15% ER/PR positive HER-2/neu negative. One Sentinel lymph node  biopsy negative No DCIS. Severe calcified benign lesions in left breast with PASH (pseudoangiomatous stromal hyperplasia).  Patient had left breast lumpectomy by Dr. Ninfa Linden on 07/09/2016.  She completed her adjuvant radiation therapy on 09/2016  Has been on Raloxifene since then for adjuvant endocrine therapy. (AI not considered due to severe osteoporosis with fractures)  05/26/18 MM revealed No mammographic evidence of malignancy in either breast. 2. Stable left breast posttreatment changes.  #2 Severe osteoporosis - multiple risk factors including chronic steroid therapy, age, rheumatoid arthritis. Her bone density study shows significant osteoporosis despite being on Fosamax for 2 years. Last vitamin D levels are adequate at 50 She has had an osteoporotic left hip fracture around May 2016.  #3 Rheumatoid arthritis was on Orencia per rheumatology - currently off due to LTBI status Plan -Continue follow-up with Dr. Estanislado Pandy from rheumatology for ongoing treatments.   #4 Anemia of chronic disease due to chronic inflammation from RA  Plan -no indication for additional IV Iron at this time -continue to optimize RA management.  #5 New +ve TB quantiferon test - currently on treatment for LTBI  PLAN:  -Discussed pt labwork today, 04/14/20; Hgb holding stable, WBC & PLT are nml, chemistries look good, Sat ratios & Ferritin are WNL. -No lab or clinical evidence of Breast Cancer recurrence at this time. Will continue watchful observation.  -No prohibitive toxicities from continuing 60 mg Evista daily at this time. -Will continue Prolia every 6 months and Vitamin D optimization for bone density improvement.  -Recommend pt continue daily baby ASA with food to reduce risk of blod clots. -No indication for IV Iron at this time, as Ferritin >100. -Follow-up for Mammogram in 03/22 -Will see back in 6 months with labs    FOLLOW UP: MMG in 07/2020 RTC with Dr. Irene Limbo for labs, MD visit, and  Prolia injection in 6 months    The total time spent in the appt was 35 minutes  and more than 50% was on counseling and direct patient cares.  All of the patient's questions were answered with apparent satisfaction. The patient knows to call the clinic with any problems, questions or concerns.    Sullivan Lone MD [place] AAHIVMS [facility] Hematology/Oncology Physician [facility]  (Office):       [phone] (Work cell):  [phone] (Fax):           [phone]  I, Yevette Edwards, am acting as a scribe for Dr. Sullivan Lone.   .I have reviewed the above documentation for accuracy and completeness, and I agree with the above. [name] MD

## 2020-05-07 MED FILL — ORENCIA 125 MG/ML SYRINGE: 125 | 28 days supply | Qty: 4 | Fill #2

## 2020-05-16 ENCOUNTER — Other Ambulatory Visit: Payer: Self-pay | Admitting: Hematology

## 2020-05-16 NOTE — Telephone Encounter (Signed)
Please see refill request.

## 2020-05-28 NOTE — Progress Notes (Signed)
Office Visit Note  Patient: Felicia Gonzalez             Date of Birth: 10-05-1940           MRN: [id number]             PCP: Vernie Shanks, MD Referring: Vernie Shanks, MD Visit Date: 06/11/2020 Occupation: _0 @  Subjective:  Right shoulder and lower back pain.   History of Present Illness: Felicia Gonzalez is a 79 y.o. female history of rheumatoid arthritis and osteoarthritis.  She states she has been taking [place] on a regular basis.  She had a flare in [place].  At the time she started taking prednisone 10 mg p.o. daily which she recently dropped to 5 mg p.o. daily.  She states she has been experiencing lower back pain for which she was seen by Dr. Jacelyn Grip.  He did x-rays and advised her that she may have some sciatica symptoms.  He also advised her to get a TENS unit which she could not use.  She has been experiencing pain and discomfort in her right shoulder and difficulty lifting it.  None of the other joints are painful or swollen.  She has been having some discomfort in her left shoulder joint.  Activities of Daily Living:  Patient reports morning stiffness for 0 minutes.    Patient Denies nocturnal pain.  Difficulty dressing/grooming: Reports Difficulty climbing stairs: Denies Difficulty getting out of chair: Denies Difficulty using hands for taps, buttons, cutlery, and/or writing: Denies  Review of Systems  Constitutional: Negative for fatigue.  HENT: Negative for mouth sores, mouth dryness and nose dryness.   Eyes: Negative for pain, itching and dryness.  Respiratory: Negative for shortness of breath and difficulty breathing.   Cardiovascular: Negative for chest pain and palpitations.  Gastrointestinal: Negative for blood in stool, constipation and diarrhea.  Endocrine: Negative for increased urination.  Genitourinary: Negative for difficulty urinating.  Musculoskeletal: Positive for arthralgias, joint pain, myalgias, muscle tenderness and myalgias. Negative  for joint swelling and morning stiffness.  Skin: Negative for color change, rash and redness.  Allergic/Immunologic: Negative for susceptible to infections.  Neurological: Positive for numbness. Negative for dizziness, headaches, memory loss and weakness.  Hematological: Positive for bruising/bleeding tendency.  Psychiatric/Behavioral: Negative for confusion and sleep disturbance.    PMFS History:  Patient Active Problem List   Diagnosis Date Noted  . Latent tuberculosis by blood test 08/09/2018  . Malignant neoplasm of lower-inner quadrant of left breast in female, estrogen receptor positive ([place]) 10/04/2016  . Osteoporosis 10/04/2016  . High risk medication use 06/08/2016  . Age-related osteoporosis without current pathological fracture 06/08/2016  . ANA positive 06/08/2016  . Moderate persistent asthma 06/08/2016  . Normocytic anemia 10/01/2014  . Rheumatoid arthritis of multiple sites without organ or system involvement with positive rheumatoid factor ([place]) 10/01/2014  . Subcapital fracture of left hip ([place]) 10/01/2014    Past Medical History:  Diagnosis Date  . Anemia   . Arthritis    rheumatoid +CCP neg RF +ANA synovitis   . Asthma   . Breast cancer ([place]) 2018   Left Breast Cancer  . Cancer [facility])    Breast cancer  . Glaucoma    right eye  . Osteoporosis   . Personal history of radiation therapy 09/2016   Left Breast Cancer    Family History  Problem Relation Age of Onset  . Hypertension Father    Past Surgical History:  Procedure Laterality Date  .  BREAST BIOPSY Left 05/14/2016   x2  . BREAST BIOPSY Right 05/14/2016  . BREAST LUMPECTOMY Left 07/09/2016  . BREAST LUMPECTOMY WITH RADIOACTIVE SEED AND SENTINEL LYMPH NODE BIOPSY Left 07/09/2016   Procedure: BREAST LUMPECTOMY WITH RADIOACTIVE SEED AND SENTINEL LYMPH NODE BIOPSY;  Surgeon: Douglas Blackman, MD;  Location[facility];  Service: General;  Laterality: Left;  . NO PAST SURGERIES    . TOTAL HIP ARTHROPLASTY  Left 10/01/2014   Procedure: TOTAL HIP ARTHROPLASTY;  Surgeon: Peter W Whitfield, MD;  Location: [facility];  Service: Orthopedics;  Laterality: Left;   Social History   Social History Narrative  . Not on file   Immunization History  Administered Date(s) Administered  . PFIZER SARS-COV-2 Vaccination 06/20/2019, 07/11/2019, 02/18/2020     Objective: Vital Signs: BP (!) 147/87 (BP Location: Left Arm, Patient Position: Sitting, Cuff Size: Normal)   Pulse 94   Resp 15   Ht 5' (1.524 m)   Wt 141 lb (64 kg)   BMI 27.54 kg/m    Physical Exam Vitals and nursing note reviewed.  Constitutional:      Appearance: She is well-developed and well-nourished.  HENT:     Head: Normocephalic and atraumatic.  Eyes:     Extraocular Movements: EOM normal.     Conjunctiva/sclera: Conjunctivae normal.  Cardiovascular:     Rate and Rhythm: Normal rate and regular rhythm.     Pulses: Intact distal pulses.     Heart sounds: Normal heart sounds.  Pulmonary:     Effort: Pulmonary effort is normal.     Breath sounds: Normal breath sounds.  Abdominal:     General: Bowel sounds are normal.     Palpations: Abdomen is soft.  Musculoskeletal:     Cervical back: Normal range of motion.  Lymphadenopathy:     Cervical: No cervical adenopathy.  Skin:    General: Skin is warm and dry.     Capillary Refill: Capillary refill takes less than 2 seconds.  Neurological:     Mental Status: She is alert and oriented to person, place, and time.  Psychiatric:        Mood and Affect: Mood and affect normal.        Behavior: Behavior normal.      Musculoskeletal Exam: C-spine was in good range of motion.  She had discomfort range of motion of her right shoulder joint.  Elbow joints and wrist joints with good range of motion.  She had PIP and DIP thickening with no synovitis.  Hip joints with good range of motion.  She has some discomfort range of motion of her left hip joint.  Knee joints with good range of motion with  no synovitis.  Some tenderness over ankles or MTPs.  CDAI Exam: CDAI Score: 1.4  Patient Global: 2 mm; Provider Global: 2 mm Swollen: 0 ; Tender: 1  Joint Exam 06/11/2020      Right  Left  Glenohumeral   Tender        Investigation: No additional findings.  Imaging: No results found.  Recent Labs: Lab Results  Component Value Date   WBC 6.7 04/14/2020   HGB 10.6 (L) 04/14/2020   PLT 279 04/14/2020   NA 139 04/14/2020   K 4.0 04/14/2020   CL 102 04/14/2020   CO2 29 04/14/2020   GLUCOSE 97 04/14/2020   BUN 14 04/14/2020   CREATININE 0.75 04/14/2020   BILITOT 0.4 04/14/2020   ALKPHOS 77 04/14/2020   AST 30 04/14/2020     ALT 34 04/14/2020   PROT 7.0 04/14/2020   ALBUMIN 3.4 (L) 04/14/2020   CALCIUM 9.2 04/14/2020   GFRAA 98 01/02/2020   QFTBGOLD Negative 03/10/2016   QFTBGOLDPLUS POSITIVE (A) 07/21/2018    Speciality Comments: Orencia IV every 4 weeks  Procedures:  No procedures performed Allergies: Penicillins and Sulfa antibiotics   Assessment / Plan:     Visit Diagnoses: Rheumatoid arthritis of multiple sites without organ or system involvement with positive rheumatoid factor (HCC) - Positive RF, positive anti-CCP, positive ANA, elevated ESR.  She has been doing well on Orencia and Arava combination.  She is a still on prednisone 5 mg p.o. daily.  She had no synovitis on examination.  I have advised her to try tapering prednisone by 5 mg alternating with 2.5 mg if possible.  High risk medication use - Orencia 125 mg subq every 7 days (resumed May 2020), Arava 20 mg 1 tablet daily, and prednisone 5 mg 1 tablet daily. - Plan: DG Chest 2 View is stable in February.  Her labs are stable.  She will get labs in February and every 3 months to monitor for drug toxicity.  She is fully vaccinated against COVID-19 and also received a booster.  Tendinopathy of right shoulder-she has been having discomfort in her right shoulder joint.  She had tendinopathy in the past.  I will  refer her to physical therapy.  Pain in left hip-she had left hip surgery in the past due to femur fracture.  She seen Dr. Whitfield in the past.  She will make follow-up appointment with them.  Chronic midline low back pain with bilateral sciatica-she complains of lower back pain and radiculopathy to her bilateral gluteal region.  She states she had x-rays done by Dr. Wang.  I will refer her to physical therapy per her request.  Positive QuantiFERON-TB Gold test - Plan: DG Chest 2 View due in February 2022.  ANA positive - No clinical features of autoimmune disease  Age-related osteoporosis without current pathological fracture - DEXA September 26, 2019 T score -2.8.  She is on Prolia subcu by oncology.  Malignant neoplasm of upper-inner quadrant of left breast in female, estrogen receptor positive (HCC)  History of asthma  History of anemia  Orders: Orders Placed This Encounter  Procedures  . DG Chest 2 View  . Ambulatory referral to Orthopedic Surgery  . Ambulatory referral to Physical Therapy   No orders of the defined types were placed in this encounter.    Follow-Up Instructions: Return in about 4 months (around 10/09/2020) for Rheumatoid arthritis, Osteoarthritis.   Shaili Deveshwar, MD  Note - This record has been created using Dragon software.  Chart creation errors have been sought, but may not always  have been located. Such creation errors do not reflect on  the standard of medical care. 

## 2020-06-04 ENCOUNTER — Other Ambulatory Visit: Payer: Self-pay | Admitting: *Deleted

## 2020-06-04 MED ORDER — LEFLUNOMIDE 20 MG PO TABS
20.0000 mg | ORAL_TABLET | Freq: Every day | ORAL | 0 refills | Status: DC
Start: 2020-06-04 — End: 2020-08-27

## 2020-06-04 NOTE — Telephone Encounter (Signed)
Patient contacted the office requesting a refill on Arava to be sent to [facility] on [address].   Last Visit: 01/02/2020 Next Visit: 06/11/2020 Labs: 04/14/2020 Albumin 3.4, RBC 3.79, Hgb 10.6, Hct 33.9, RDW 15.9  Current dose per office note 01/02/2020: Arava 20 mg 1 tablet daily DX: Rheumatoid arthritis of multiple sites without organ or system involvement with positive rheumatoid factor   Okay to refill Arava?

## 2020-06-05 ENCOUNTER — Other Ambulatory Visit: Payer: Self-pay | Admitting: Physician Assistant

## 2020-06-05 NOTE — Telephone Encounter (Signed)
Last Visit: 01/02/2020 Next Visit: 06/11/2020 Labs: 04/14/2020, RBC 3.79, Hemoglobin 10.6, HCT 33.9, RDW 15.9, Albumin 3.4 TB Gold: chest x-ray, 07/25/2019, No acute disease.  Current Dose per office note 01/02/2020, Orencia 125 mg subq every 7 days (resumed May 2020)  DX: Rheumatoid arthritis of multiple sites without organ or system involvement with positive rheumatoid factor   Okay to refill Orencia?

## 2020-06-09 MED FILL — ORENCIA 125 MG/ML SYRINGE: 125 | 28 days supply | Qty: 4 | Fill #0

## 2020-06-11 ENCOUNTER — Encounter: Payer: Self-pay | Admitting: Rheumatology

## 2020-06-11 ENCOUNTER — Other Ambulatory Visit: Payer: Self-pay

## 2020-06-11 ENCOUNTER — Ambulatory Visit (INDEPENDENT_AMBULATORY_CARE_PROVIDER_SITE_OTHER): Payer: Medicare Other | Admitting: Rheumatology

## 2020-06-11 VITALS — BP 147/87 | HR 94 | Resp 15 | Ht 60.0 in | Wt 141.0 lb

## 2020-06-11 DIAGNOSIS — Z8709 Personal history of other diseases of the respiratory system: Secondary | ICD-10-CM

## 2020-06-11 DIAGNOSIS — C50212 Malignant neoplasm of upper-inner quadrant of left female breast: Secondary | ICD-10-CM

## 2020-06-11 DIAGNOSIS — M0579 Rheumatoid arthritis with rheumatoid factor of multiple sites without organ or systems involvement: Secondary | ICD-10-CM

## 2020-06-11 DIAGNOSIS — M67911 Unspecified disorder of synovium and tendon, right shoulder: Secondary | ICD-10-CM | POA: Diagnosis not present

## 2020-06-11 DIAGNOSIS — M5441 Lumbago with sciatica, right side: Secondary | ICD-10-CM | POA: Diagnosis not present

## 2020-06-11 DIAGNOSIS — M25552 Pain in left hip: Secondary | ICD-10-CM

## 2020-06-11 DIAGNOSIS — M5442 Lumbago with sciatica, left side: Secondary | ICD-10-CM

## 2020-06-11 DIAGNOSIS — G8929 Other chronic pain: Secondary | ICD-10-CM

## 2020-06-11 DIAGNOSIS — R768 Other specified abnormal immunological findings in serum: Secondary | ICD-10-CM | POA: Diagnosis not present

## 2020-06-11 DIAGNOSIS — R7612 Nonspecific reaction to cell mediated immunity measurement of gamma interferon antigen response without active tuberculosis: Secondary | ICD-10-CM

## 2020-06-11 DIAGNOSIS — Z862 Personal history of diseases of the blood and blood-forming organs and certain disorders involving the immune mechanism: Secondary | ICD-10-CM | POA: Diagnosis not present

## 2020-06-11 DIAGNOSIS — Z79899 Other long term (current) drug therapy: Secondary | ICD-10-CM | POA: Diagnosis not present

## 2020-06-11 DIAGNOSIS — M810 Age-related osteoporosis without current pathological fracture: Secondary | ICD-10-CM

## 2020-06-11 DIAGNOSIS — Z170 Estrogen receptor positive status [ER+]: Secondary | ICD-10-CM

## 2020-06-11 DIAGNOSIS — R7689 Other specified abnormal immunological findings in serum: Secondary | ICD-10-CM

## 2020-06-11 NOTE — Patient Instructions (Signed)
Standing Labs We placed an order today for your standing lab work.   Please have your standing labs drawn in February and every 3 months    If possible, please have your labs drawn 2 weeks prior to your appointment so that the provider can discuss your results at your appointment.  We have open lab daily Monday through Thursday from 8:30-12:30 PM and 1:30-4:30 PM and Friday from 8:30-12:30 PM and 1:30-4:00 PM at the office of Dr. Akyah Lagrange, [facility].   Please be advised, patients with office appointments requiring lab work will take precedents over walk-in lab work.  If possible, please come for your lab work on Monday and Friday afternoons, as you may experience shorter wait times. The office is located at [address], [address], [place], [place] [address] No appointment is necessary.   Labs are drawn by [facility]. Please bring your co-pay at the time of your lab draw.  You may receive a bill from [facility] for your lab work.  If you wish to have your labs drawn at another location, please call the office 24 hours in advance to send orders.  If you have any questions regarding directions or hours of operation,  please call [phone].   As a reminder, please drink plenty of water prior to coming for your lab work. Thanks!   

## 2020-06-18 ENCOUNTER — Ambulatory Visit: Payer: Medicare Other | Admitting: Orthopaedic Surgery

## 2020-06-23 DIAGNOSIS — M79601 Pain in right arm: Secondary | ICD-10-CM | POA: Insufficient documentation

## 2020-06-23 DIAGNOSIS — M7541 Impingement syndrome of right shoulder: Secondary | ICD-10-CM | POA: Insufficient documentation

## 2020-06-27 DIAGNOSIS — M79601 Pain in right arm: Secondary | ICD-10-CM | POA: Diagnosis not present

## 2020-06-27 DIAGNOSIS — M7541 Impingement syndrome of right shoulder: Secondary | ICD-10-CM | POA: Diagnosis not present

## 2020-06-30 ENCOUNTER — Other Ambulatory Visit: Payer: Self-pay | Admitting: Rheumatology

## 2020-06-30 NOTE — Telephone Encounter (Signed)
Last Visit: 06/11/2020 Next Visit: 10/08/2020  Current Dose per office note on 06/11/2020, prednisone 5 mg 1 tablet daily Dx:  Rheumatoid arthritis of multiple sites without organ or system involvement with positive rheumatoid factor   Okay to refill Prednisone?

## 2020-07-01 DIAGNOSIS — M7541 Impingement syndrome of right shoulder: Secondary | ICD-10-CM | POA: Diagnosis not present

## 2020-07-01 DIAGNOSIS — M79601 Pain in right arm: Secondary | ICD-10-CM | POA: Diagnosis not present

## 2020-07-02 ENCOUNTER — Ambulatory Visit (INDEPENDENT_AMBULATORY_CARE_PROVIDER_SITE_OTHER): Payer: Medicare Other | Admitting: Orthopaedic Surgery

## 2020-07-02 ENCOUNTER — Ambulatory Visit: Payer: Self-pay

## 2020-07-02 ENCOUNTER — Encounter: Payer: Self-pay | Admitting: Orthopaedic Surgery

## 2020-07-02 ENCOUNTER — Other Ambulatory Visit: Payer: Self-pay

## 2020-07-02 VITALS — Ht 60.0 in | Wt 141.0 lb

## 2020-07-02 DIAGNOSIS — M545 Low back pain, unspecified: Secondary | ICD-10-CM | POA: Insufficient documentation

## 2020-07-02 DIAGNOSIS — G8929 Other chronic pain: Secondary | ICD-10-CM

## 2020-07-02 NOTE — Progress Notes (Signed)
Office Visit Note   Patient: Felicia Gonzalez           Date of Birth: 05-19-1941           MRN: [id number] Visit Date: 07/02/2020              Requested by: Vernie Shanks, MD [place],   [id number] PCP: Vernie Shanks, MD   Assessment & Plan: Visit Diagnoses:  1. Chronic left-sided low back pain, unspecified whether sciatica present   2. Chronic bilateral low back pain without sciatica     Plan: [name] been experiencing low back pain particularly in the morning with some referred pain into bilateral buttock and the posterior aspect of both thighs.  She is not had any numbness or tingling.  X-rays demonstrate degenerative changes at L4-5 and L5-S1 without acute change.  She is being followed by Dr. Estanislado Pandy for rheumatoid arthritis.  She suggested a course of physical therapy for her back and she has had 3 sessions.  She cannot tell a difference as yet.  I think her present symptoms are related to the arthritis of her lumbar spine.  There is no radiculopathy or neurologic deficit.  I think the therapy is is ideal for the moment.  I like to check her back in 4 to 6 weeks if there is no improvement consider an MRI scan  Follow-Up Instructions: Return in about 6 weeks (around 08/13/2020).   Orders:  Orders Placed This Encounter  Procedures  . XR Lumbar Spine 2-3 Views   No orders of the defined types were placed in this encounter.     Procedures: No procedures performed   Clinical Data: No additional findings.   Subjective: Chief Complaint  Patient presents with  . Left Hip - Pain  Patient presents today for lower back pain. She said that it has been hurting for a few weeks. She saw Dr.Deveswhar and was referred to Dr.Christl Fessenden. She states that she has rheumatoid arthritis. She is currently in PT and taking prednisone. She said that the pain radiates down both legs posteriorly to the level of her calfs. She had x-rays with Dr.Wong in November of 2021 but  we cannot see those in our system.  Pain is predominately in the morning when she first gets out of bed.  She does not have any particular problem when she stands or walks for any distance.  No evidence of radiculopathy or dedication.  Had a prior left total hip replacement 2016 and doing well  HPI  Review of Systems   Objective: Vital Signs: Ht 5' (1.524 m)   Wt 141 lb (64 kg)   BMI 27.54 kg/m   Physical Exam Constitutional:      Appearance: She is well-developed and well-nourished.  HENT:     Mouth/Throat:     Mouth: Oropharynx is clear and moist.  Eyes:     Extraocular Movements: EOM normal.     Pupils: Pupils are equal, round, and reactive to light.  Pulmonary:     Effort: Pulmonary effort is normal.  Skin:    General: Skin is warm and dry.  Neurological:     Mental Status: She is alert and oriented to person, place, and time.  Psychiatric:        Mood and Affect: Mood and affect normal.        Behavior: Behavior normal.     Ortho Exam awake alert and oriented x3.  Comfortable sitting.  No  pain with range of motion of either hip.  Straight leg raise negative.  Reflexes were symmetrical.  Motor strength similar both sides.  Normal sensation.  Very minimal percussible tenderness of the lumbosacral junction.  No pain over either SI joint  Specialty Comments:  No specialty comments available.  Imaging: XR Lumbar Spine 2-3 Views  Result Date: 07/02/2020 Films of the lumbar spine obtained with 2 projections.  There is a left lumbar scoliosis.  Diffuse bony demineralization.  Very minimal anterior listhesis of L4 on 5 on the lateral film and very minimal calcification of the abdominal aorta.  No obvious compression fracture.  Diffuse facet sclerosis particularly at L4-5 and L5-S1 disc spaces appear to be relatively well-maintained    PMFS History: Patient Active Problem List   Diagnosis Date Noted  . Low back pain 07/02/2020  . Latent tuberculosis by blood test  08/09/2018  . Malignant neoplasm of lower-inner quadrant of left breast in female, estrogen receptor positive ([place]) 10/04/2016  . Osteoporosis 10/04/2016  . High risk medication use 06/08/2016  . Age-related osteoporosis without current pathological fracture 06/08/2016  . ANA positive 06/08/2016  . Moderate persistent asthma 06/08/2016  . Normocytic anemia 10/01/2014  . Rheumatoid arthritis of multiple sites without organ or system involvement with positive rheumatoid factor ([place]) 10/01/2014  . Subcapital fracture of left hip ([place]) 10/01/2014   Past Medical History:  Diagnosis Date  . Anemia   . Arthritis    rheumatoid +CCP neg RF +ANA synovitis   . Asthma   . Breast cancer ([place]) 2018   Left Breast Cancer  . Cancer [facility])    Breast cancer  . Glaucoma    right eye  . Osteoporosis   . Personal history of radiation therapy 09/2016   Left Breast Cancer    Family History  Problem Relation Age of Onset  . Hypertension Father     Past Surgical History:  Procedure Laterality Date  . BREAST BIOPSY Left 05/14/2016   x2  . BREAST BIOPSY Right 05/14/2016  . BREAST LUMPECTOMY Left 07/09/2016  . BREAST LUMPECTOMY WITH RADIOACTIVE SEED AND SENTINEL LYMPH NODE BIOPSY Left 07/09/2016   Procedure: BREAST LUMPECTOMY WITH RADIOACTIVE SEED AND SENTINEL LYMPH NODE BIOPSY;  Surgeon: Coralie Keens, MD;  Location: [facility];  Service: General;  Laterality: Left;  . NO PAST SURGERIES    . TOTAL HIP ARTHROPLASTY Left 10/01/2014   Procedure: TOTAL HIP ARTHROPLASTY;  Surgeon: Garald Balding, MD;  Location: [facility];  Service: Orthopedics;  Laterality: Left;   Social History   Occupational History  . Not on file  Tobacco Use  . Smoking status: Never Smoker  . Smokeless tobacco: Never Used  Vaping Use  . Vaping Use: Never used  Substance and Sexual Activity  . Alcohol use: No  . Drug use: Never  . Sexual activity: Not Currently

## 2020-07-07 DIAGNOSIS — M7541 Impingement syndrome of right shoulder: Secondary | ICD-10-CM | POA: Diagnosis not present

## 2020-07-07 DIAGNOSIS — M79601 Pain in right arm: Secondary | ICD-10-CM | POA: Diagnosis not present

## 2020-07-08 ENCOUNTER — Telehealth: Payer: Self-pay | Admitting: Rheumatology

## 2020-07-08 MED FILL — ORENCIA 125 MG/ML SYRINGE: 125 | 28 days supply | Qty: 4 | Fill #1

## 2020-07-08 NOTE — Telephone Encounter (Signed)
FYI :Patient calling to let you know she is going for her xray to [facility] on Friday 07/11/2020. She will come in for a lab draw here, next week.

## 2020-07-09 ENCOUNTER — Other Ambulatory Visit: Payer: Self-pay

## 2020-07-09 NOTE — Telephone Encounter (Signed)
Patient called requesting prescription refill of Leflunomide to be sent to [facility] at [address].

## 2020-07-09 NOTE — Telephone Encounter (Signed)
Seth Bake called patient, too soon to refill

## 2020-07-11 ENCOUNTER — Ambulatory Visit (HOSPITAL_COMMUNITY)
Admission: RE | Admit: 2020-07-11 | Discharge: 2020-07-11 | Disposition: A | Discharge status: Home / Self Care | Payer: Medicare Other | Source: Ambulatory Visit | Attending: Rheumatology | Admitting: Rheumatology

## 2020-07-11 ENCOUNTER — Other Ambulatory Visit: Payer: Self-pay

## 2020-07-11 DIAGNOSIS — I700 Atherosclerosis of aorta: Secondary | ICD-10-CM | POA: Diagnosis not present

## 2020-07-11 DIAGNOSIS — R7612 Nonspecific reaction to cell mediated immunity measurement of gamma interferon antigen response without active tuberculosis: Secondary | ICD-10-CM | POA: Diagnosis present

## 2020-07-11 DIAGNOSIS — R0602 Shortness of breath: Secondary | ICD-10-CM | POA: Diagnosis not present

## 2020-07-11 DIAGNOSIS — Z79899 Other long term (current) drug therapy: Secondary | ICD-10-CM

## 2020-07-14 DIAGNOSIS — M79601 Pain in right arm: Secondary | ICD-10-CM | POA: Diagnosis not present

## 2020-07-14 DIAGNOSIS — M7541 Impingement syndrome of right shoulder: Secondary | ICD-10-CM | POA: Diagnosis not present

## 2020-07-16 ENCOUNTER — Other Ambulatory Visit: Payer: Self-pay | Admitting: *Deleted

## 2020-07-16 DIAGNOSIS — Z79899 Other long term (current) drug therapy: Secondary | ICD-10-CM | POA: Diagnosis not present

## 2020-07-17 ENCOUNTER — Telehealth: Payer: Self-pay

## 2020-07-17 LAB — COMPLETE METABOLIC PANEL WITH GFR
AG Ratio: 1.5 (calc) (ref 1.0–2.5)
ALT: 23 U/L (ref 6–29)
AST: 24 U/L (ref 10–35)
Albumin: 3.8 g/dL (ref 3.6–5.1)
Alkaline phosphatase (APISO): 60 U/L (ref 37–153)
BUN: 12 mg/dL (ref 7–25)
CO2: 31 mmol/L (ref 20–32)
Calcium: 9.2 mg/dL (ref 8.6–10.4)
Chloride: 101 mmol/L (ref 98–110)
Creat: 0.69 mg/dL (ref 0.60–0.93)
GFR, Est African American: 97 mL/min/{1.73_m2} (ref >=60)
GFR, Est Non African American: 83 mL/min/{1.73_m2} (ref >=60)
Globulin: 2.5 g/dL (calc) (ref 1.9–3.7)
Glucose, Bld: 87 mg/dL (ref 65–99)
Potassium: 4.5 mmol/L (ref 3.5–5.3)
Sodium: 140 mmol/L (ref 135–146)
Total Bilirubin: 0.3 mg/dL (ref 0.2–1.2)
Total Protein: 6.3 g/dL (ref 6.1–8.1)

## 2020-07-17 LAB — CBC WITH DIFFERENTIAL/PLATELET
Absolute Monocytes: 878 cells/uL (ref 200–950)
Basophils Absolute: 86 cells/uL (ref 0–200)
Basophils Relative: 1.2 %
Eosinophils Absolute: 108 cells/uL (ref 15–500)
Eosinophils Relative: 1.5 %
HCT: 34.5 % — ABNORMAL LOW (ref 35.0–45.0)
Hemoglobin: 11.4 g/dL — ABNORMAL LOW (ref 11.7–15.5)
Lymphs Abs: 2110 cells/uL (ref 850–3900)
MCH: 29.3 pg (ref 27.0–33.0)
MCHC: 33 g/dL (ref 32.0–36.0)
MCV: 88.7 fL (ref 80.0–100.0)
MPV: 10.4 fL (ref 7.5–12.5)
Monocytes Relative: 12.2 %
Neutro Abs: 4018 cells/uL (ref 1500–7800)
Neutrophils Relative %: 55.8 %
Platelets: 306 10*3/uL (ref 140–400)
RBC: 3.89 10*6/uL (ref 3.80–5.10)
RDW: 14.6 % (ref 11.0–15.0)
Total Lymphocyte: 29.3 %
WBC: 7.2 10*3/uL (ref 3.8–10.8)

## 2020-07-17 NOTE — Telephone Encounter (Signed)
Spoke with patient and reviewed lab work with her. Mailed a copy at her request.

## 2020-07-17 NOTE — Telephone Encounter (Signed)
Patient left a vociemail stating she was returning Andrea's call regarding her labwork results.

## 2020-07-17 NOTE — Progress Notes (Signed)
Anemia noted which is stable.  CMP is normal.

## 2020-07-23 DIAGNOSIS — M7541 Impingement syndrome of right shoulder: Secondary | ICD-10-CM | POA: Diagnosis not present

## 2020-07-23 DIAGNOSIS — M79601 Pain in right arm: Secondary | ICD-10-CM | POA: Diagnosis not present

## 2020-07-25 DIAGNOSIS — M79601 Pain in right arm: Secondary | ICD-10-CM | POA: Diagnosis not present

## 2020-07-25 DIAGNOSIS — M7541 Impingement syndrome of right shoulder: Secondary | ICD-10-CM | POA: Diagnosis not present

## 2020-07-29 DIAGNOSIS — M79601 Pain in right arm: Secondary | ICD-10-CM | POA: Diagnosis not present

## 2020-07-29 DIAGNOSIS — M7541 Impingement syndrome of right shoulder: Secondary | ICD-10-CM | POA: Diagnosis not present

## 2020-08-01 DIAGNOSIS — E7800 Pure hypercholesterolemia, unspecified: Secondary | ICD-10-CM | POA: Diagnosis not present

## 2020-08-01 DIAGNOSIS — M5442 Lumbago with sciatica, left side: Secondary | ICD-10-CM | POA: Diagnosis not present

## 2020-08-01 DIAGNOSIS — M069 Rheumatoid arthritis, unspecified: Secondary | ICD-10-CM | POA: Diagnosis not present

## 2020-08-01 DIAGNOSIS — J4540 Moderate persistent asthma, uncomplicated: Secondary | ICD-10-CM | POA: Diagnosis not present

## 2020-08-01 DIAGNOSIS — I10 Essential (primary) hypertension: Secondary | ICD-10-CM | POA: Diagnosis not present

## 2020-08-01 DIAGNOSIS — Z853 Personal history of malignant neoplasm of breast: Secondary | ICD-10-CM | POA: Diagnosis not present

## 2020-08-01 DIAGNOSIS — D649 Anemia, unspecified: Secondary | ICD-10-CM | POA: Diagnosis not present

## 2020-08-01 DIAGNOSIS — M5441 Lumbago with sciatica, right side: Secondary | ICD-10-CM | POA: Diagnosis not present

## 2020-08-01 DIAGNOSIS — M810 Age-related osteoporosis without current pathological fracture: Secondary | ICD-10-CM | POA: Diagnosis not present

## 2020-08-04 DIAGNOSIS — M7541 Impingement syndrome of right shoulder: Secondary | ICD-10-CM | POA: Diagnosis not present

## 2020-08-04 DIAGNOSIS — M79601 Pain in right arm: Secondary | ICD-10-CM | POA: Diagnosis not present

## 2020-08-04 MED FILL — ORENCIA 125 MG/ML SYRINGE: 125 | 28 days supply | Qty: 4 | Fill #2

## 2020-08-22 ENCOUNTER — Other Ambulatory Visit (HOSPITAL_COMMUNITY): Payer: Self-pay

## 2020-08-22 DIAGNOSIS — M069 Rheumatoid arthritis, unspecified: Secondary | ICD-10-CM | POA: Diagnosis not present

## 2020-08-22 DIAGNOSIS — E7800 Pure hypercholesterolemia, unspecified: Secondary | ICD-10-CM | POA: Diagnosis not present

## 2020-08-22 DIAGNOSIS — M810 Age-related osteoporosis without current pathological fracture: Secondary | ICD-10-CM | POA: Diagnosis not present

## 2020-08-22 DIAGNOSIS — M7541 Impingement syndrome of right shoulder: Secondary | ICD-10-CM | POA: Diagnosis not present

## 2020-08-22 DIAGNOSIS — I10 Essential (primary) hypertension: Secondary | ICD-10-CM | POA: Diagnosis not present

## 2020-08-22 DIAGNOSIS — M05769 Rheumatoid arthritis with rheumatoid factor of unspecified knee without organ or systems involvement: Secondary | ICD-10-CM | POA: Diagnosis not present

## 2020-08-22 DIAGNOSIS — M79601 Pain in right arm: Secondary | ICD-10-CM | POA: Diagnosis not present

## 2020-08-22 DIAGNOSIS — D649 Anemia, unspecified: Secondary | ICD-10-CM | POA: Diagnosis not present

## 2020-08-22 DIAGNOSIS — J4540 Moderate persistent asthma, uncomplicated: Secondary | ICD-10-CM | POA: Diagnosis not present

## 2020-08-25 ENCOUNTER — Other Ambulatory Visit: Payer: Self-pay | Admitting: Physician Assistant

## 2020-08-25 DIAGNOSIS — M79601 Pain in right arm: Secondary | ICD-10-CM | POA: Diagnosis not present

## 2020-08-25 DIAGNOSIS — M7541 Impingement syndrome of right shoulder: Secondary | ICD-10-CM | POA: Diagnosis not present

## 2020-08-25 NOTE — Telephone Encounter (Signed)
Next Visit: 10/01/2020  Last Visit: 06/11/2020  Last Fill: 06/05/2020  DX: Rheumatoid arthritis of multiple sites without organ or system involvement with positive rheumatoid factor   Current Dose per office note 06/11/2020, Orencia 125 mg subq every 7 days  Labs: 07/16/2020, Anemia noted which is stable. CMP is normal.  TB Gold: Chest x-ray 07/12/2020, No acute cardiopulmonary disease.  Okay to refill Orencia?

## 2020-08-27 ENCOUNTER — Other Ambulatory Visit: Payer: Self-pay

## 2020-08-27 DIAGNOSIS — M7541 Impingement syndrome of right shoulder: Secondary | ICD-10-CM | POA: Diagnosis not present

## 2020-08-27 DIAGNOSIS — M79601 Pain in right arm: Secondary | ICD-10-CM | POA: Diagnosis not present

## 2020-08-27 MED ORDER — LEFLUNOMIDE 20 MG PO TABS: 20.0000 mg | ORAL_TABLET | Freq: Every day | ORAL | 0 refills | Status: DC

## 2020-08-27 MED ORDER — PREDNISONE 5 MG PO TABS: ORAL_TABLET | ORAL | 0 refills | Status: DC

## 2020-08-27 MED FILL — ORENCIA 125 MG/ML SYRINGE: 125 | 2814 days supply | Qty: 4 | Fill #0

## 2020-08-27 NOTE — Telephone Encounter (Signed)
Patient left a voicemail requesting prescription refills of Arava and Prednisone.  Patient states she is going out of town and requesting to pick up the prescriptions today, 08/27/20.

## 2020-08-27 NOTE — Telephone Encounter (Signed)
Next Visit: 10/01/2020  Last Visit: 06/11/2020  Last Fill: Arava 06/04/2020, Prednisone 06/30/2020  DX: Rheumatoid arthritis of multiple sites without organ or system involvement with positive rheumatoid factor   Current Dose per office note 06/11/2020, Arava 20 mg 1 tablet daily, and prednisone 5 mg 1 tablet daily  Labs: 07/16/2020, Anemia noted which is stable. CMP is normal.  Okay to refill Arava and Prednisone?

## 2020-09-10 DIAGNOSIS — D0510 Intraductal carcinoma in situ of unspecified breast: Secondary | ICD-10-CM | POA: Diagnosis not present

## 2020-09-10 DIAGNOSIS — E7800 Pure hypercholesterolemia, unspecified: Secondary | ICD-10-CM | POA: Diagnosis not present

## 2020-09-10 DIAGNOSIS — Z853 Personal history of malignant neoplasm of breast: Secondary | ICD-10-CM | POA: Diagnosis not present

## 2020-09-10 DIAGNOSIS — M069 Rheumatoid arthritis, unspecified: Secondary | ICD-10-CM | POA: Diagnosis not present

## 2020-09-10 DIAGNOSIS — M05769 Rheumatoid arthritis with rheumatoid factor of unspecified knee without organ or systems involvement: Secondary | ICD-10-CM | POA: Diagnosis not present

## 2020-09-10 DIAGNOSIS — D649 Anemia, unspecified: Secondary | ICD-10-CM | POA: Diagnosis not present

## 2020-09-10 DIAGNOSIS — M810 Age-related osteoporosis without current pathological fracture: Secondary | ICD-10-CM | POA: Diagnosis not present

## 2020-09-10 DIAGNOSIS — I10 Essential (primary) hypertension: Secondary | ICD-10-CM | POA: Diagnosis not present

## 2020-09-10 DIAGNOSIS — J4520 Mild intermittent asthma, uncomplicated: Secondary | ICD-10-CM | POA: Diagnosis not present

## 2020-09-10 DIAGNOSIS — J4540 Moderate persistent asthma, uncomplicated: Secondary | ICD-10-CM | POA: Diagnosis not present

## 2020-09-16 ENCOUNTER — Other Ambulatory Visit: Payer: Self-pay | Admitting: Rheumatology

## 2020-09-16 ENCOUNTER — Other Ambulatory Visit: Payer: Self-pay | Admitting: Physician Assistant

## 2020-09-17 ENCOUNTER — Other Ambulatory Visit: Payer: Self-pay | Admitting: *Deleted

## 2020-09-17 ENCOUNTER — Telehealth: Payer: Self-pay | Admitting: Rheumatology

## 2020-09-17 DIAGNOSIS — G8929 Other chronic pain: Secondary | ICD-10-CM

## 2020-09-17 DIAGNOSIS — M67911 Unspecified disorder of synovium and tendon, right shoulder: Secondary | ICD-10-CM

## 2020-09-17 NOTE — Progress Notes (Signed)
Office Visit Note  Patient: Felicia Gonzalez             Date of Birth: Jan 10, 1941           MRN: [id number]             PCP: Kathyrn Lass, MD Referring: Vernie Shanks, MD Visit Date: 10/01/2020 Occupation: @GUAROCC @  Subjective:  Pain in multiple joint.   History of Present Illness: SHALEKA Gonzalez is a 80 y.o. female with the history of rheumatoid arthritis, osteoarthritis and osteoporosis.  She states that she had to come off Orencia due to the COVID-19 booster.  She was having increased pain.  She has been taking prednisone 5 mg p.o. daily.  She is unable to taper prednisone below 5 mg due to increased pain and discomfort.  She describes discomfort mostly over her CMC joints and PIP and DIP joints.  She denies any joint swelling.  She states her right shoulder joint has limited range of motion.  She was going to physical therapy but she had to interrupt therapy due to her travels.  She has been getting Prolia injections through oncology for osteoporosis.  Activities of Daily Living:  Patient reports morning stiffness for all day.    Patient Reports nocturnal pain.  Difficulty dressing/grooming: Reports Difficulty climbing stairs: Denies Difficulty getting out of chair: Denies Difficulty using hands for taps, buttons, cutlery, and/or writing: Reports  Review of Systems  Constitutional: Positive for fatigue. Negative for night sweats, weight gain and weight loss.  HENT: Negative for mouth sores, trouble swallowing, trouble swallowing, mouth dryness and nose dryness.   Eyes: Negative for pain, redness, itching, visual disturbance and dryness.  Respiratory: Negative for cough, shortness of breath and difficulty breathing.   Cardiovascular: Negative for chest pain, palpitations, hypertension, irregular heartbeat and swelling in legs/feet.  Gastrointestinal: Negative for blood in stool, constipation and diarrhea.  Endocrine: Negative for increased urination.  Genitourinary: Negative for  difficulty urinating and vaginal dryness.  Musculoskeletal: Positive for arthralgias, joint pain, myalgias, morning stiffness, muscle tenderness and myalgias. Negative for joint swelling and muscle weakness.  Skin: Negative for color change, rash, hair loss, redness, skin tightness, ulcers and sensitivity to sunlight.  Allergic/Immunologic: Negative for susceptible to infections.  Neurological: Negative for dizziness, numbness, headaches, memory loss, night sweats and weakness.  Hematological: Negative for bruising/bleeding tendency and swollen glands.  Psychiatric/Behavioral: Negative for depressed mood, confusion and sleep disturbance. The patient is not nervous/anxious.     PMFS History:  Patient Active Problem List   Diagnosis Date Noted  . Low back pain 07/02/2020  . Latent tuberculosis by blood test 08/09/2018  . Malignant neoplasm of lower-inner quadrant of left breast in female, estrogen receptor positive ([place]) 10/04/2016  . Osteoporosis 10/04/2016  . High risk medication use 06/08/2016  . Age-related osteoporosis without current pathological fracture 06/08/2016  . ANA positive 06/08/2016  . Moderate persistent asthma 06/08/2016  . Normocytic anemia 10/01/2014  . Rheumatoid arthritis of multiple sites without organ or system involvement with positive rheumatoid factor ([place]) 10/01/2014  . Subcapital fracture of left hip ([place]) 10/01/2014    Past Medical History:  Diagnosis Date  . Anemia   . Arthritis    rheumatoid +CCP neg RF +ANA synovitis   . Asthma   . Breast cancer ([place]) 2018   Left Breast Cancer  . Cancer [facility])    Breast cancer  . Glaucoma    right eye  . Osteoporosis   . Personal history of  radiation therapy 09/2016   Left Breast Cancer    Family History  Problem Relation Age of Onset  . Hypertension Father    Past Surgical History:  Procedure Laterality Date  . BREAST BIOPSY Left 05/14/2016   x2  . BREAST BIOPSY Right 05/14/2016  . BREAST LUMPECTOMY Left  07/09/2016  . BREAST LUMPECTOMY WITH RADIOACTIVE SEED AND SENTINEL LYMPH NODE BIOPSY Left 07/09/2016   Procedure: BREAST LUMPECTOMY WITH RADIOACTIVE SEED AND SENTINEL LYMPH NODE BIOPSY;  Surgeon: Abigail Miyamoto, MD;  Location: [facility];  Service: General;  Laterality: Left;  . NO PAST SURGERIES    . TOTAL HIP ARTHROPLASTY Left 10/01/2014   Procedure: TOTAL HIP ARTHROPLASTY;  Surgeon: Valeria Batman, MD;  Location: [facility];  Service: Orthopedics;  Laterality: Left;   Social History   Social History Narrative  . Not on file   Immunization History  Administered Date(s) Administered  . PFIZER(Purple Top)SARS-COV-2 Vaccination 06/20/2019, 07/11/2019, 02/18/2020, 09/23/2020     Objective: Vital Signs: BP 124/78 (BP Location: Left Arm, Patient Position: Sitting, Cuff Size: Normal)   Pulse (!) 108   Resp 14   Ht 4\' 9"  (1.448 m)   Wt 142 lb (64.4 kg)   BMI 30.73 kg/m    Physical Exam Vitals and nursing note reviewed.  Constitutional:      Appearance: She is well-developed.  HENT:     Head: Normocephalic and atraumatic.  Eyes:     Conjunctiva/sclera: Conjunctivae normal.  Cardiovascular:     Rate and Rhythm: Normal rate and regular rhythm.     Heart sounds: Normal heart sounds.  Pulmonary:     Effort: Pulmonary effort is normal.     Breath sounds: Normal breath sounds.  Abdominal:     General: Bowel sounds are normal.     Palpations: Abdomen is soft.  Musculoskeletal:     Cervical back: Normal range of motion.  Lymphadenopathy:     Cervical: No cervical adenopathy.  Skin:    General: Skin is warm and dry.     Capillary Refill: Capillary refill takes less than 2 seconds.  Neurological:     Mental Status: She is alert and oriented to person, place, and time.  Psychiatric:        Behavior: Behavior normal.      Musculoskeletal Exam: C-spine was in good range of motion.  She has some discomfort and limitation of abduction of her right shoulder joint about 160 degrees.  She has  some discomfort with internal rotation.  Elbow joints, wrist joints, MCPs were in good range of motion with no synovitis.  She has bilateral CMC, PIP and DIP thickening.  Hip joints and knee joints with good range of motion with no synovitis.  She had no tenderness over ankles or MTP joints.  CDAI Exam: CDAI Score: 0.3  Patient Global: 2 mm; Provider Global: 1 mm Swollen: 0 ; Tender: 0  Joint Exam 10/01/2020   No joint exam has been documented for this visit   There is currently no information documented on the homunculus. Go to the Rheumatology activity and complete the homunculus joint exam.  Investigation: No additional findings.  Imaging: No results found.  Recent Labs: Lab Results  Component Value Date   WBC 7.2 07/16/2020   HGB 11.4 (L) 07/16/2020   PLT 306 07/16/2020   NA 140 07/16/2020   K 4.5 07/16/2020   CL 101 07/16/2020   CO2 31 07/16/2020   GLUCOSE 87 07/16/2020   BUN 12 07/16/2020  CREATININE 0.69 07/16/2020   BILITOT 0.3 07/16/2020   ALKPHOS 77 04/14/2020   AST 24 07/16/2020   ALT 23 07/16/2020   PROT 6.3 07/16/2020   ALBUMIN 3.4 (L) 04/14/2020   CALCIUM 9.2 07/16/2020   GFRAA 97 07/16/2020   QFTBGOLD Negative 03/10/2016   QFTBGOLDPLUS POSITIVE (A) 07/21/2018    Speciality Comments: Orencia IV every 4 weeks  Procedures:  No procedures performed Allergies: Penicillins and Sulfa antibiotics   Assessment / Plan:     Visit Diagnoses: Rheumatoid arthritis of multiple sites without organ or system involvement with positive rheumatoid factor (H[facility]) - Positive RF, positive anti-CCP, positive ANA, elevated ESR.  She has no synovitis on examination today.  High risk medication use - Orencia 125 mg subq every 7 days (resumed May 2020), Arava 20 mg 1 tablet daily, and prednisone 5 mg 1 tablet daily.  Patient had labs at her PCPs office.  We will get the labs from there.  If her labs are normal then she will get labs in August and every 3 months to monitor for  drug toxicity.  We had detailed discussion regarding tapering her prednisone.  She is hesitant to taper prednisone.  Have advised her to try 5 mg alternating with 2.5 mg p.o. every other day.  Current chronic use of systemic steroids-patient states that she was told that her hemoglobin A1c is elevated and her cholesterol is higher.  I have discouraged chronic use of steroids.  Dietary modifications were discussed at length.  Need for regular exercise was emphasized.  Handout was placed in the AVS.  Increased risk of heart disease with rheumatoid arthritis was also discussed.  Tendinopathy of right shoulder-she will continue with physical therapy and stretching exercises.  Chronic midline low back pain with bilateral sciatica - She states she had x-rays done by Dr. Mina Marble.  Positive QuantiFERON-TB Gold test - Chest x-ray July 02, 2020 did not show any active disease.  ANA positive - No clinical features of autoimmune disease  Age-related osteoporosis without current pathological fracture - DEXA September 26, 2019 T score -2.8.  She is on Prolia subcu by oncology.  Malignant neoplasm of upper-inner quadrant of left breast in female, estrogen receptor positive ([place])  History of anemia  History of asthma  Orders: No orders of the defined types were placed in this encounter.  No orders of the defined types were placed in this encounter.    Follow-Up Instructions: Return in about 4 months (around 02/01/2021) for Rheumatoid arthritis, Osteoarthritis, Osteoporosis.   Bo Merino, MD  Note - This record has been created using Editor, commissioning.  Chart creation errors have been sought, but may not always  have been located. Such creation errors do not reflect on  the standard of medical care.

## 2020-09-17 NOTE — Telephone Encounter (Signed)
Patient calling in reference to : #1.Patient would like  to extend PT, and will need order. #2. Patient would like to talk to you about getting the second booster. Please call to discuss.

## 2020-09-17 NOTE — Telephone Encounter (Signed)
Ok to provide order to extend physical therapy.    We do recommend proceeding with the 2nd booster dose.  Please advise the patient to hold orencia for 1 week after receiving the booster dose.  She should also avoid tylenol and NSAIDs 24 hours prior to the vaccine.  Ok to continue on arava as prescribed.

## 2020-09-17 NOTE — Telephone Encounter (Signed)
I called patient, patient verbalized understanding, referral for PT at [facility] placed

## 2020-09-26 ENCOUNTER — Other Ambulatory Visit: Payer: Self-pay | Admitting: Family Medicine

## 2020-09-26 DIAGNOSIS — J4520 Mild intermittent asthma, uncomplicated: Secondary | ICD-10-CM | POA: Diagnosis not present

## 2020-09-26 DIAGNOSIS — R739 Hyperglycemia, unspecified: Secondary | ICD-10-CM | POA: Diagnosis not present

## 2020-09-26 DIAGNOSIS — I10 Essential (primary) hypertension: Secondary | ICD-10-CM | POA: Diagnosis not present

## 2020-09-26 DIAGNOSIS — Z0000 Encounter for general adult medical examination without abnormal findings: Secondary | ICD-10-CM | POA: Diagnosis not present

## 2020-09-26 DIAGNOSIS — Z1231 Encounter for screening mammogram for malignant neoplasm of breast: Secondary | ICD-10-CM

## 2020-09-29 ENCOUNTER — Other Ambulatory Visit (HOSPITAL_COMMUNITY): Payer: Self-pay

## 2020-10-01 ENCOUNTER — Other Ambulatory Visit (HOSPITAL_COMMUNITY): Payer: Self-pay

## 2020-10-01 ENCOUNTER — Ambulatory Visit (INDEPENDENT_AMBULATORY_CARE_PROVIDER_SITE_OTHER): Payer: Medicare Other | Admitting: Rheumatology

## 2020-10-01 ENCOUNTER — Encounter: Payer: Self-pay | Admitting: Rheumatology

## 2020-10-01 ENCOUNTER — Other Ambulatory Visit: Payer: Self-pay

## 2020-10-01 VITALS — BP 124/78 | HR 108 | Resp 14 | Ht <= 58 in | Wt 142.0 lb

## 2020-10-01 DIAGNOSIS — M5442 Lumbago with sciatica, left side: Secondary | ICD-10-CM | POA: Diagnosis not present

## 2020-10-01 DIAGNOSIS — Z7952 Long term (current) use of systemic steroids: Secondary | ICD-10-CM

## 2020-10-01 DIAGNOSIS — M5441 Lumbago with sciatica, right side: Secondary | ICD-10-CM

## 2020-10-01 DIAGNOSIS — R768 Other specified abnormal immunological findings in serum: Secondary | ICD-10-CM | POA: Diagnosis not present

## 2020-10-01 DIAGNOSIS — Z862 Personal history of diseases of the blood and blood-forming organs and certain disorders involving the immune mechanism: Secondary | ICD-10-CM

## 2020-10-01 DIAGNOSIS — M67911 Unspecified disorder of synovium and tendon, right shoulder: Secondary | ICD-10-CM | POA: Diagnosis not present

## 2020-10-01 DIAGNOSIS — Z79899 Other long term (current) drug therapy: Secondary | ICD-10-CM | POA: Diagnosis not present

## 2020-10-01 DIAGNOSIS — C50212 Malignant neoplasm of upper-inner quadrant of left female breast: Secondary | ICD-10-CM | POA: Diagnosis not present

## 2020-10-01 DIAGNOSIS — M0579 Rheumatoid arthritis with rheumatoid factor of multiple sites without organ or systems involvement: Secondary | ICD-10-CM

## 2020-10-01 DIAGNOSIS — Z170 Estrogen receptor positive status [ER+]: Secondary | ICD-10-CM

## 2020-10-01 DIAGNOSIS — M25552 Pain in left hip: Secondary | ICD-10-CM

## 2020-10-01 DIAGNOSIS — G8929 Other chronic pain: Secondary | ICD-10-CM

## 2020-10-01 DIAGNOSIS — M810 Age-related osteoporosis without current pathological fracture: Secondary | ICD-10-CM | POA: Diagnosis not present

## 2020-10-01 DIAGNOSIS — R7612 Nonspecific reaction to cell mediated immunity measurement of gamma interferon antigen response without active tuberculosis: Secondary | ICD-10-CM

## 2020-10-01 DIAGNOSIS — Z8709 Personal history of other diseases of the respiratory system: Secondary | ICD-10-CM

## 2020-10-01 NOTE — Patient Instructions (Signed)
Standing Labs We placed an order today for your standing lab work.   Please have your standing labs drawn in July and every 3 months  If possible, please have your labs drawn 2 weeks prior to your appointment so that the provider can discuss your results at your appointment.  We have open lab daily Monday through Thursday from 1:30-4:30 PM and Friday from 1:30-4:00 PM at the office of Dr. Bo Merino, [facility].   Please be advised, all patients with office appointments requiring lab work will take precedents over walk-in lab work.  If possible, please come for your lab work on Monday and Friday afternoons, as you may experience shorter wait times. The office is located at [address] [place], [place], [place] [address] No appointment is necessary.   Labs are drawn by [facility]. Please bring your co-pay at the time of your lab draw.  You may receive a bill from [facility] for your lab work.  If you wish to have your labs drawn at another location, please call the office 24 hours in advance to send orders.  If you have any questions regarding directions or hours of operation,  please call [phone].   As a reminder, please drink plenty of water prior to coming for your lab work. Thanks!  Vaccines You are taking a medication(s) that can suppress your immune system.  The following immunizations are recommended: . Flu annually . Covid-19  . Pneumonia (Pneumovax 23 and Prevnar 13 spaced at least 1 year apart) . Shingrix (after age 1)  Please check with your PCP to make sure you are up to date.  Heart Disease Prevention   Your inflammatory disease increases your risk of heart disease which includes heart attack, stroke, atrial fibrillation (irregular heartbeats), high blood pressure, heart failure and atherosclerosis (plaque in the arteries).  It is important to reduce your risk by:   . Keep blood pressure, cholesterol, and blood sugar at healthy levels   . Smoking  Cessation   . Maintain a healthy weight  o BMI 20-25   . Eat a healthy diet  o Plenty of fresh fruit, vegetables, and whole grains  o Limit saturated fats, foods high in sodium, and added sugars  o DASH and Mediterranean diet   . Increase physical activity  o Recommend moderate physically activity for 150 minutes per week/ 30 minutes a day for five days a week These can be broken up into three separate ten-minute sessions during the day.   . Reduce Stress  . Meditation, slow breathing exercises, yoga, coloring books  . Dental visits twice a year

## 2020-10-03 ENCOUNTER — Telehealth: Payer: Self-pay

## 2020-10-03 ENCOUNTER — Other Ambulatory Visit (HOSPITAL_COMMUNITY): Payer: Self-pay

## 2020-10-03 DIAGNOSIS — Z79899 Other long term (current) drug therapy: Secondary | ICD-10-CM

## 2020-10-03 MED FILL — Abatacept Subcutaneous Soln Prefilled Syringe 125 MG/ML: SUBCUTANEOUS | 28 days supply | Qty: 4 | Fill #0 | Status: AC

## 2020-10-03 NOTE — Telephone Encounter (Signed)
Received labs via fax from [facility].   Date: 09/26/2020  CMP, A1c and lipid panel  CMP WNL  A1c 6.1 Cholesterol 230 Calc LDL 130 Non- HDL 152  Labs reviewed by Hazel Sams, PA-C. Document sent to the scan center.   We are in need of a CBC. I advised patient and she will come to the office on Monday to have that drawn. Order has been placed. Provided patient with lab hours.

## 2020-10-06 ENCOUNTER — Other Ambulatory Visit: Payer: Self-pay

## 2020-10-06 DIAGNOSIS — I10 Essential (primary) hypertension: Secondary | ICD-10-CM | POA: Diagnosis not present

## 2020-10-06 DIAGNOSIS — J4520 Mild intermittent asthma, uncomplicated: Secondary | ICD-10-CM | POA: Diagnosis not present

## 2020-10-06 DIAGNOSIS — Z79899 Other long term (current) drug therapy: Secondary | ICD-10-CM | POA: Diagnosis not present

## 2020-10-06 DIAGNOSIS — M069 Rheumatoid arthritis, unspecified: Secondary | ICD-10-CM | POA: Diagnosis not present

## 2020-10-06 DIAGNOSIS — J4540 Moderate persistent asthma, uncomplicated: Secondary | ICD-10-CM | POA: Diagnosis not present

## 2020-10-06 DIAGNOSIS — D649 Anemia, unspecified: Secondary | ICD-10-CM | POA: Diagnosis not present

## 2020-10-06 DIAGNOSIS — E7800 Pure hypercholesterolemia, unspecified: Secondary | ICD-10-CM | POA: Diagnosis not present

## 2020-10-06 DIAGNOSIS — M810 Age-related osteoporosis without current pathological fracture: Secondary | ICD-10-CM | POA: Diagnosis not present

## 2020-10-06 DIAGNOSIS — M05769 Rheumatoid arthritis with rheumatoid factor of unspecified knee without organ or systems involvement: Secondary | ICD-10-CM | POA: Diagnosis not present

## 2020-10-06 LAB — CBC WITH DIFFERENTIAL/PLATELET
Absolute Monocytes: 839 cells/uL (ref 200–950)
Basophils Absolute: 78 cells/uL (ref 0–200)
Basophils Relative: 1.2 %
Eosinophils Absolute: 163 cells/uL (ref 15–500)
Eosinophils Relative: 2.5 %
HCT: 33.2 % — ABNORMAL LOW (ref 35.0–45.0)
Hemoglobin: 10.6 g/dL — ABNORMAL LOW (ref 11.7–15.5)
Lymphs Abs: 1983 cells/uL (ref 850–3900)
MCH: 28.2 pg (ref 27.0–33.0)
MCHC: 31.9 g/dL — ABNORMAL LOW (ref 32.0–36.0)
MCV: 88.3 fL (ref 80.0–100.0)
MPV: 10.7 fL (ref 7.5–12.5)
Monocytes Relative: 12.9 %
Neutro Abs: 3439 cells/uL (ref 1500–7800)
Neutrophils Relative %: 52.9 %
Platelets: 297 10*3/uL (ref 140–400)
RBC: 3.76 10*6/uL — ABNORMAL LOW (ref 3.80–5.10)
RDW: 14.3 % (ref 11.0–15.0)
Total Lymphocyte: 30.5 %
WBC: 6.5 10*3/uL (ref 3.8–10.8)

## 2020-10-07 ENCOUNTER — Other Ambulatory Visit (HOSPITAL_COMMUNITY): Payer: Self-pay

## 2020-10-07 NOTE — Progress Notes (Signed)
RBC count, hgb, and hct are low and have trended back down.  Please clarify if the patient has been taking a multivitamin with iron?

## 2020-10-07 NOTE — Progress Notes (Signed)
Please forward lab work to PCP.

## 2020-10-08 ENCOUNTER — Ambulatory Visit: Payer: Medicare Other | Admitting: Rheumatology

## 2020-10-08 DIAGNOSIS — M545 Low back pain, unspecified: Secondary | ICD-10-CM | POA: Diagnosis not present

## 2020-10-08 DIAGNOSIS — M25511 Pain in right shoulder: Secondary | ICD-10-CM | POA: Diagnosis not present

## 2020-10-10 ENCOUNTER — Other Ambulatory Visit: Payer: Self-pay | Admitting: *Deleted

## 2020-10-10 DIAGNOSIS — Z170 Estrogen receptor positive status [ER+]: Secondary | ICD-10-CM

## 2020-10-11 NOTE — Progress Notes (Signed)
HEMATOLOGY ONCOLOGY CLINIC NOTE  Date of service: 10/13/20   Patient Care Team: Sigmund Hazel, MD as PCP - General (Family Medicine)  Chief Complaint:  F/u for ER/PR positive Her neg Breast cancer  Diagnosis  Stage I (pT1b, pN0, Mx) left sided invasive ductal carcinoma of the breast. 1 cm in size with lymphovascular invasion grade 2/3, Ki-67 15% ER/PR positive HER-2/neu negative. One Sentinel lymph node biopsy negative No DCIS. Severe calcified benign lesions in left breast with PASH (pseudoangiomatous stromal hyperplasia).  Patient is status post left breast lumpectomy by Dr. Magnus Ivan on 07/09/2016.   S/p adjuvant  Left breast RT on 10/05/2016  INTERVAL HISTORY:  Ms Kreitz is here for her scheduled follow-up for stage I left breast cancer. The patient's last visit with [place] was on 04/14/2020. The pt reports that she is doing well overall.  The pt reports that she had a physical visit with her PCP and they noted some concerns over her cholesterol being elevated. The pt notes that she will be controlling this with diet and food intake. The pt notes her A1C is now at 6. The pt is still on 2.5 and 5 mg alternating daily Prednisone. The pt notes that she is still on Orencia. The pt notes that things are very stable with her RA. The pt notes she has decreased her salt and olive oil intake. She has started eating more avocados and beet roots. The pt notes that her mammogram is for July 1. The pt notes she is going to PT. The pt is currently taking B6 and B12. The pt is also taking 2 extra strength Tylenol daily.  Lab results today 10/13/2020 of CBC w/diff and CMP is as follows: all values are WNL except for RBC of 3.61, Hgb of 10.2, HCT of 32.3, Albumin of 3.3. 10/13/2020 Iron of 92, Sat Ratio of 29. 10/13/2020 Ferritin of 113.  On review of systems, pt reports osteoartritis and denies acute breast symptoms, bleeding issues, new lumps/bumps, back pain, and any other symptoms.  REVIEW OF  SYSTEMS:   10 Point review of Systems was done is negative except as noted above. . Past Medical History:  Diagnosis Date  . Anemia   . Arthritis    rheumatoid +CCP neg RF +ANA synovitis   . Asthma   . Breast cancer (HCC) 2018   Left Breast Cancer  . Cancer [facility])    Breast cancer  . Glaucoma    right eye  . Osteoporosis   . Personal history of radiation therapy 09/2016   Left Breast Cancer     Past Surgical History:  Procedure Laterality Date  . BREAST BIOPSY Left 05/14/2016   x2  . BREAST BIOPSY Right 05/14/2016  . BREAST LUMPECTOMY Left 07/09/2016  . BREAST LUMPECTOMY WITH RADIOACTIVE SEED AND SENTINEL LYMPH NODE BIOPSY Left 07/09/2016   Procedure: BREAST LUMPECTOMY WITH RADIOACTIVE SEED AND SENTINEL LYMPH NODE BIOPSY;  Surgeon: Abigail Miyamoto, MD;  Location: [facility] OR;  Service: General;  Laterality: Left;  . NO PAST SURGERIES    . TOTAL HIP ARTHROPLASTY Left 10/01/2014   Procedure: TOTAL HIP ARTHROPLASTY;  Surgeon: Valeria Batman, MD;  Location: [facility] OR;  Service: Orthopedics;  Laterality: Left;    Social History   Tobacco Use  . Smoking status: Never Smoker  . Smokeless tobacco: Never Used  Vaping Use  . Vaping Use: Never used  Substance Use Topics  . Alcohol use: No  . Drug use: Never    ALLERGIES:  is  allergic to penicillins and sulfa antibiotics.  MEDICATIONS:  Current Outpatient Medications  Medication Sig Dispense Refill  . Abatacept 125 MG/ML SOSY INJECT 125 MG INTO THE SKIN ONCE A WEEK. 4 mL 2  . acetaminophen (TYLENOL) 650 MG CR tablet Take 1,300 mg by mouth as needed for pain.     [name] albuterol (PROVENTIL HFA;VENTOLIN HFA) 108 (90 Base) MCG/ACT inhaler Inhale 1-2 puffs into the lungs every 6 (six) hours as needed for wheezing or shortness of breath.    [name] amLODipine (NORVASC) 5 MG tablet Take 5 mg by mouth daily.     [name] aspirin EC 81 MG tablet Take 81 mg by mouth daily.    . Cholecalciferol (VITAMIN D3) 5000 units TABS Take 5,000 Units by mouth daily after  breakfast.    . latanoprost (XALATAN) 0.005 % ophthalmic solution INSTILL 1 DROP INTO BOTH EYES EVERY DAY IN THE EVENING    . leflunomide (ARAVA) 20 MG tablet Take 1 tablet (20 mg total) by mouth daily. 90 tablet 0  . Multiple Vitamin (MULTIVITAMIN WITH MINERALS) TABS tablet Take 1 tablet by mouth daily.    . Omega-3 Fatty Acids (OMEGA-3 FISH OIL) 1200 MG CAPS Take 1,200 mg by mouth 2 (two) times daily.    . predniSONE (DELTASONE) 5 MG tablet TAKE 1 TABLET BY MOUTH EVERY DAY WITH BREAKFAST 90 tablet 0  . Pyridoxine HCl (VITAMIN B-6 PO) Take by mouth daily.    . raloxifene (EVISTA) 60 MG tablet TAKE 1 TABLET BY MOUTH EVERY DAY 90 tablet 3  . TURMERIC PO Take 1 capsule by mouth daily after breakfast.    . vitamin B-12 (CYANOCOBALAMIN) 500 MCG tablet Take 500 mcg by mouth daily.     Current Facility-Administered Medications  Medication Dose Route Frequency Provider Last Rate Last Admin  . denosumab (PROLIA) injection 60 mg  60 mg Subcutaneous Once [name], MD        PHYSICAL EXAMINATION: ECOG PERFORMANCE STATUS: 1 - Symptomatic but completely ambulatory  Vitals:   10/13/20 1111  BP: (!) 151/90  Pulse: 93  Resp: 14  Temp: (!) 96.3 F (35.7 C)  SpO2: 98%    Filed Weights   10/13/20 1111  Weight: 140 lb (63.5 kg)   Body mass index is 30.3 kg/m.    GENERAL:alert, in no acute distress and comfortable SKIN: no acute rashes, no significant lesions EYES: conjunctiva are pink and non-injected, sclera anicteric OROPHARYNX: MMM, no exudates, no oropharyngeal erythema or ulceration NECK: supple, no JVD LYMPH:  no palpable lymphadenopathy in the cervical, axillary or inguinal regions LUNGS: clear to auscultation b/l with normal respiratory effort HEART: regular rate & rhythm ABDOMEN:  normoactive bowel sounds , non tender, not distended. Extremity: no pedal edema PSYCH: alert & oriented x 3 with fluent speech NEURO: no focal motor/sensory deficits    LABORATORY DATA:    I have reviewed the data as listed  . CBC Latest Ref Rng & Units 10/13/2020 10/06/2020 07/16/2020  WBC 4.0 - 10.5 K/uL 5.7 6.5 7.2  Hemoglobin 12.0 - 15.0 g/dL 10.2(L) 10.6(L) 11.4(L)  Hematocrit 36.0 - 46.0 % 32.3(L) 33.2(L) 34.5(L)  Platelets 150 - 400 K/uL 263 297 306   . CMP Latest Ref Rng & Units 10/13/2020 07/16/2020 04/14/2020  Glucose 70 - 99 mg/dL 89 87 97  BUN 8 - 23 mg/dL $Remove'13 12 14  'ENfWmZA$ Creatinine 0.44 - 1.00 mg/dL 0.73 0.69 0.75  Sodium 135 - 145 mmol/L 139 140 139  Potassium 3.5 - 5.1 mmol/L  4.1 4.5 4.0  Chloride 98 - 111 mmol/L 104 101 102  CO2 22 - 32 mmol/L $RemoveB'29 31 29  'SBsIKZSg$ Calcium 8.9 - 10.3 mg/dL 9.3 9.2 9.2  Total Protein 6.5 - 8.1 g/dL 6.7 6.3 7.0  Total Bilirubin 0.3 - 1.2 mg/dL 0.3 0.3 0.4  Alkaline Phos 38 - 126 U/L 65 - 77  AST 15 - 41 U/L 32 24 30  ALT 0 - 44 U/L 26 23 34    25 OH vit D - 54.7---> 49.2  . Lab Results  Component Value Date   IRON 92 10/13/2020   TIBC 318 10/13/2020   IRONPCTSAT 29 10/13/2020   (Iron and TIBC)  Lab Results  Component Value Date   FERRITIN 113 10/13/2020   [id number]        Microscopic Comment 1. BREAST, INVASIVE TUMOR Procedure: Seed localized lumpectomy. Laterality: Left breast. Tumor Size (gross measurement): 1.0 cm Histologic Type: Ductal Grade: II Tubular Differentiation: 3 Nuclear Pleomorphism: 2 Mitotic Count: 1 Ductal Carcinoma in Situ (DCIS): Not identified Extent of Tumor: Confined to breast parenchyma Margins: Invasive carcinoma, distance from closest margin: Focally 0.1 cm to the anterior margin Regional Lymph Nodes: Number of Lymph Nodes Examined: 1 Number of Sentinel Lymph Nodes Examined: 1 Lymph Nodes with Macrometastases: 0 Lymph Nodes with Micrometastases: 0 Lymph Nodes with Isolated Tumor Cells: 0 Breast Prognostic Profile: [id number] Estrogen Receptor: 100%, strong Progesterone Receptor: 5%, strong Her2: No amplification was detected. The ratio was 1.27 1 of 3 FINAL for HOLLYANN, PABLO  B [phone]) Microscopic Comment(continued) Ki-67: 15% Pathologic Stage Classification (pTNM, AJCC 8th Edition): Primary Tumor (pT): pT1b Regional Lymph Nodes (pN): pN0 (JBK:ecj 07/12/2016) Enid Cutter MD Pathologist, Electronic Signature (Case signed 07/12/2016)     CLINICAL DATA:  Followup for probably benign right breast mass and probably benign left breast calcifications.  EXAM: 2D DIGITAL DIAGNOSTIC BILATERAL MAMMOGRAM WITH CAD AND ADJUNCT TOMO  BILATERAL BREAST ULTRASOUND  COMPARISON:  Previous exam(s).  ACR Breast Density Category c: The breast tissue is heterogeneously dense, which may obscure small masses.  FINDINGS: No suspicious masses or calcifications are seen in the right breast. The oval circumscribed mass in the upper-outer posterior right breast appears unchanged. Spot compression magnification views were performed over the inner left breast demonstrating unchanged 3 mm group of punctate calcifications. There is a mass with irregular margins in the lower inner left breast measuring approximately 7-8 mm.  Mammographic images were processed with CAD.  Physical examination of the left breast reveals a firm nodule at the 8 o'clock position.  Targeted ultrasound of the left breast was performed demonstrating an irregular hypoechoic mass at 8 o'clock 2 cm from nipple measuring 0.8 x 0.6 x 0.7 cm. This corresponds well with the mass seen at mammography. No lymphadenopathy seen in the left axilla.  Targeted ultrasound of the right breast was performed demonstrating a mixed echogenicity mass at 10 o'clock 8 cm from nipple measuring 1.4 x 0.6 x 1.1 cm. Although this may represent an area of fat necrosis is indeterminate. No lymphadenopathy seen in the right axilla.  IMPRESSION: 1. Suspicious mass in the left breast at the 8 o'clock position.  2. Stable appearance of punctate calcifications in the lower inner left breast.  3. Indeterminate  mass in the right breast at 10 o'clock 8 cm from the nipple which cannot be clearly characterized as an area of fat necrosis.  RECOMMENDATION: 1. Recommend ultrasound-guided biopsy of the mass in the left breast at the 8 o'clock position.  2. Recommend stereotactic guided biopsy of the calcifications in the lower inner left breast (given the suspicious mass seen in close proximity to the calcifications).  3. Recommend ultrasound-guided biopsy of the mass in the right breast at 10 o'clock position.  Biopsies are being scheduled for the patient.  I have discussed the findings and recommendations with the patient. Results were also provided in writing at the conclusion of the visit. If applicable, a reminder letter will be sent to the patient regarding the next appointment.  BI-RADS CATEGORY  4: Suspicious.   Electronically Signed   By: Everlean Alstrom M.D.   On: 05/12/2016 13:56   RADIOGRAPHIC STUDIES: I have personally reviewed the radiological images as listed and agreed with the findings in the report. No results found.  ASSESSMENT & PLAN:   80 y.o.  female with  #1 Stage I (pT1b, pN0, Mx) left sided invasive ductal carcinoma of the breast. 1 cm in size with lymphovascular invasion grade 2/3, Ki-67 15% ER/PR positive HER-2/neu negative. One Sentinel lymph node biopsy negative No DCIS. Severe calcified benign lesions in left breast with PASH (pseudoangiomatous stromal hyperplasia).  Patient had left breast lumpectomy by Dr. Ninfa Linden on 07/09/2016.  She completed her adjuvant radiation therapy on 09/2016  Has been on Raloxifene since then for adjuvant endocrine therapy. (AI not considered due to severe osteoporosis with fractures)  05/26/18 MM revealed No mammographic evidence of malignancy in either breast. 2. Stable left breast posttreatment changes.  #2 Severe osteoporosis - multiple risk factors including chronic steroid therapy, age, rheumatoid  arthritis. Her bone density study shows significant osteoporosis despite being on Fosamax for 2 years. Last vitamin D levels are adequate at 49 She has had an osteoporotic left hip fracture around May 2016.  #3 Rheumatoid arthritis was on Orencia per rheumatology - currently off due to LTBI status Plan -Continue follow-up with Dr. Estanislado Pandy from rheumatology for ongoing treatments.   #4 Anemia of chronic disease due to chronic inflammation from RA  Plan -no indication for additional IV Iron at this time -continue to optimize RA management.  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 Iron Sat ratio stable, Ferritin stable. -Advised pt she is now more than 4 years removed from surgery (February 2018).  -Recommended pt not worry over fat percentage of milk that she uses in tea, as the changes will be minimal. The more natural the milk, the less processed it is. -No lab or clinical evidence of Breast Cancer recurrence at this time. Will continue watchful observation.  -No prohibitive toxicities from continuing 60 mg Evista daily at this time. -Will continue Prolia every 6 months and Vitamin D optimization for bone density improvement.  -Recommend pt continue daily baby ASA with food to reduce risk of blod clots. -Advised pt we will provide IV iron as needed for Ferritin <100. Will wait at this time due to Ferritin at 113. Nor primary driver of anemia. -Recommended pt start Vitamin B-Complex daily. -Will see back in 6 months with labs.   FOLLOW UP: Please schedule next 4 doses of Prolia every 6 months Return to clinic with Dr. Irene Limbo with labs in 6 months with next dose of Prolia   The total time spent in the appt was 30 minutes and more than 50% was on counseling and direct patient cares.  All of the patient's questions were answered with apparent satisfaction. The patient knows  to call the clinic with any problems, questions or concerns.    Sullivan Lone MD  AAHIVMS [facility] Hematology/Oncology Physician [facility]  (Office):       [phone] (Work cell):  [phone] (Fax):           [phone]  I, Reinaldo Raddle, am acting as scribe for Dr. Sullivan Lone, MD.    .I have reviewed the above documentation for accuracy and completeness, and I agree with the above. [name] MD

## 2020-10-13 ENCOUNTER — Inpatient Hospital Stay (HOSPITAL_BASED_OUTPATIENT_CLINIC_OR_DEPARTMENT_OTHER): Payer: Medicare Other | Admitting: Hematology

## 2020-10-13 ENCOUNTER — Inpatient Hospital Stay: Payer: Medicare Other | Attending: Hematology

## 2020-10-13 ENCOUNTER — Other Ambulatory Visit: Payer: Self-pay

## 2020-10-13 VITALS — BP 151/90 | HR 93 | Temp 96.3°F | Resp 14 | Ht <= 58 in | Wt 140.0 lb

## 2020-10-13 DIAGNOSIS — C50312 Malignant neoplasm of lower-inner quadrant of left female breast: Secondary | ICD-10-CM

## 2020-10-13 DIAGNOSIS — Z170 Estrogen receptor positive status [ER+]: Secondary | ICD-10-CM | POA: Diagnosis not present

## 2020-10-13 DIAGNOSIS — M069 Rheumatoid arthritis, unspecified: Secondary | ICD-10-CM | POA: Diagnosis not present

## 2020-10-13 DIAGNOSIS — D638 Anemia in other chronic diseases classified elsewhere: Secondary | ICD-10-CM | POA: Insufficient documentation

## 2020-10-13 DIAGNOSIS — M810 Age-related osteoporosis without current pathological fracture: Secondary | ICD-10-CM | POA: Insufficient documentation

## 2020-10-13 DIAGNOSIS — Z227 Latent tuberculosis: Secondary | ICD-10-CM | POA: Insufficient documentation

## 2020-10-13 DIAGNOSIS — Z1211 Encounter for screening for malignant neoplasm of colon: Secondary | ICD-10-CM | POA: Diagnosis not present

## 2020-10-13 DIAGNOSIS — Z853 Personal history of malignant neoplasm of breast: Secondary | ICD-10-CM | POA: Diagnosis not present

## 2020-10-13 LAB — CBC WITH DIFFERENTIAL (CANCER CENTER ONLY)
Abs Immature Granulocytes: 0.01 10*3/uL (ref 0.00–0.07)
Basophils Absolute: 0.1 10*3/uL (ref 0.0–0.1)
Basophils Relative: 1 %
Eosinophils Absolute: 0.2 10*3/uL (ref 0.0–0.5)
Eosinophils Relative: 3 %
HCT: 32.3 % — ABNORMAL LOW (ref 36.0–46.0)
Hemoglobin: 10.2 g/dL — ABNORMAL LOW (ref 12.0–15.0)
Immature Granulocytes: 0 %
Lymphocytes Relative: 24 %
Lymphs Abs: 1.4 10*3/uL (ref 0.7–4.0)
MCH: 28.3 pg (ref 26.0–34.0)
MCHC: 31.6 g/dL (ref 30.0–36.0)
MCV: 89.5 fL (ref 80.0–100.0)
Monocytes Absolute: 0.8 10*3/uL (ref 0.1–1.0)
Monocytes Relative: 14 %
Neutro Abs: 3.3 10*3/uL (ref 1.7–7.7)
Neutrophils Relative %: 58 %
Platelet Count: 263 10*3/uL (ref 150–400)
RBC: 3.61 MIL/uL — ABNORMAL LOW (ref 3.87–5.11)
RDW: 15.2 % (ref 11.5–15.5)
WBC Count: 5.7 10*3/uL (ref 4.0–10.5)
nRBC: 0 % (ref 0.0–0.2)

## 2020-10-13 LAB — CMP (CANCER CENTER ONLY)
ALT: 26 U/L (ref 0–44)
AST: 32 U/L (ref 15–41)
Albumin: 3.3 g/dL — ABNORMAL LOW (ref 3.5–5.0)
Alkaline Phosphatase: 65 U/L (ref 38–126)
Anion gap: 6 (ref 5–15)
BUN: 13 mg/dL (ref 8–23)
CO2: 29 mmol/L (ref 22–32)
Calcium: 9.3 mg/dL (ref 8.9–10.3)
Chloride: 104 mmol/L (ref 98–111)
Creatinine: 0.73 mg/dL (ref 0.44–1.00)
GFR, Estimated: >60 mL/min (ref >60)
Glucose, Bld: 89 mg/dL (ref 70–99)
Potassium: 4.1 mmol/L (ref 3.5–5.1)
Sodium: 139 mmol/L (ref 135–145)
Total Bilirubin: 0.3 mg/dL (ref 0.3–1.2)
Total Protein: 6.7 g/dL (ref 6.5–8.1)

## 2020-10-13 LAB — IRON AND TIBC
Iron: 92 ug/dL (ref 41–142)
Saturation Ratios: 29 % (ref 21–57)
TIBC: 318 ug/dL (ref 236–444)
UIBC: 225 ug/dL (ref 120–384)

## 2020-10-13 LAB — FERRITIN: Ferritin: 113 ng/mL (ref 11–307)

## 2020-10-13 MED ORDER — DENOSUMAB 60 MG/ML ~~LOC~~ SOSY
60.0000 mg | PREFILLED_SYRINGE | Freq: Once | SUBCUTANEOUS | Status: AC
  Administered 2020-10-13: 60 mg via SUBCUTANEOUS

## 2020-10-13 MED ORDER — DENOSUMAB 60 MG/ML ~~LOC~~ SOSY
PREFILLED_SYRINGE | SUBCUTANEOUS | Status: AC
  Filled 2020-10-13: qty 1

## 2020-10-29 ENCOUNTER — Other Ambulatory Visit (HOSPITAL_COMMUNITY): Payer: Self-pay

## 2020-11-03 ENCOUNTER — Other Ambulatory Visit (HOSPITAL_COMMUNITY): Payer: Self-pay

## 2020-11-04 ENCOUNTER — Other Ambulatory Visit (HOSPITAL_COMMUNITY): Payer: Self-pay

## 2020-11-04 MED FILL — Abatacept Subcutaneous Soln Prefilled Syringe 125 MG/ML: SUBCUTANEOUS | 28 days supply | Qty: 4 | Fill #1 | Status: AC

## 2020-11-10 DIAGNOSIS — H401131 Primary open-angle glaucoma, bilateral, mild stage: Secondary | ICD-10-CM | POA: Diagnosis not present

## 2020-11-12 DIAGNOSIS — M25511 Pain in right shoulder: Secondary | ICD-10-CM | POA: Diagnosis not present

## 2020-11-12 DIAGNOSIS — M545 Low back pain, unspecified: Secondary | ICD-10-CM | POA: Diagnosis not present

## 2020-11-14 DIAGNOSIS — M545 Low back pain, unspecified: Secondary | ICD-10-CM | POA: Diagnosis not present

## 2020-11-14 DIAGNOSIS — M25511 Pain in right shoulder: Secondary | ICD-10-CM | POA: Diagnosis not present

## 2020-11-17 DIAGNOSIS — H401131 Primary open-angle glaucoma, bilateral, mild stage: Secondary | ICD-10-CM | POA: Diagnosis not present

## 2020-11-21 DIAGNOSIS — M069 Rheumatoid arthritis, unspecified: Secondary | ICD-10-CM | POA: Diagnosis not present

## 2020-11-21 DIAGNOSIS — M810 Age-related osteoporosis without current pathological fracture: Secondary | ICD-10-CM | POA: Diagnosis not present

## 2020-11-21 DIAGNOSIS — J4520 Mild intermittent asthma, uncomplicated: Secondary | ICD-10-CM | POA: Diagnosis not present

## 2020-11-21 DIAGNOSIS — Z853 Personal history of malignant neoplasm of breast: Secondary | ICD-10-CM | POA: Diagnosis not present

## 2020-11-21 DIAGNOSIS — I10 Essential (primary) hypertension: Secondary | ICD-10-CM | POA: Diagnosis not present

## 2020-11-24 DIAGNOSIS — M545 Low back pain, unspecified: Secondary | ICD-10-CM | POA: Diagnosis not present

## 2020-11-24 DIAGNOSIS — M25511 Pain in right shoulder: Secondary | ICD-10-CM | POA: Diagnosis not present

## 2020-11-25 ENCOUNTER — Other Ambulatory Visit (HOSPITAL_COMMUNITY): Payer: Self-pay

## 2020-11-27 ENCOUNTER — Other Ambulatory Visit: Payer: Self-pay | Admitting: Physician Assistant

## 2020-11-27 ENCOUNTER — Other Ambulatory Visit (HOSPITAL_COMMUNITY): Payer: Self-pay

## 2020-11-27 MED ORDER — ORENCIA 125 MG/ML ~~LOC~~ SOSY
125.0000 mg | PREFILLED_SYRINGE | SUBCUTANEOUS | 2 refills | Status: DC
  Filled 2020-11-27: qty 4, 28d supply, fill #0
  Filled 2021-01-01: qty 4, 28d supply, fill #1
  Filled 2021-01-29: qty 4, 28d supply, fill #2

## 2020-11-27 NOTE — Telephone Encounter (Signed)
Next Visit: 02/04/2021  Last Visit: 10/01/2020  Last Fill: 08/25/2020  IE:PPIRJJOACZ arthritis of multiple sites without organ or system involvement with positive rheumatoid factor  Current Dose per office note 10/01/2020: Orencia 125 mg subq every 7 days  Labs: 09/26/2020 CMP WNL A1c 6.1  Yearly chest x-ray 07/14/2020 No acute cardiopulmonary disease.  Okay to refill Orencia?

## 2020-11-28 ENCOUNTER — Ambulatory Visit: Payer: Medicare Other

## 2020-12-02 DIAGNOSIS — M25511 Pain in right shoulder: Secondary | ICD-10-CM | POA: Diagnosis not present

## 2020-12-02 DIAGNOSIS — M545 Low back pain, unspecified: Secondary | ICD-10-CM | POA: Diagnosis not present

## 2020-12-03 ENCOUNTER — Other Ambulatory Visit (HOSPITAL_COMMUNITY): Payer: Self-pay

## 2020-12-04 DIAGNOSIS — M545 Low back pain, unspecified: Secondary | ICD-10-CM | POA: Diagnosis not present

## 2020-12-04 DIAGNOSIS — M25511 Pain in right shoulder: Secondary | ICD-10-CM | POA: Diagnosis not present

## 2020-12-15 ENCOUNTER — Other Ambulatory Visit: Payer: Self-pay | Admitting: Physician Assistant

## 2020-12-15 NOTE — Telephone Encounter (Signed)
Next Visit: 02/04/2021  Last Visit: 10/01/2020  Last Fill: 08/27/2020  Dx: Rheumatoid arthritis of multiple sites without organ or system involvement with positive rheumatoid factor  Current Dose per office note on 10/01/2020: prednisone 5 mg 1 tablet daily.    Per protocol, okay to refill per Dr. Estanislado Pandy

## 2020-12-17 ENCOUNTER — Other Ambulatory Visit: Payer: Self-pay

## 2020-12-17 MED ORDER — LEFLUNOMIDE 20 MG PO TABS: 20.0000 mg | ORAL_TABLET | Freq: Every day | ORAL | 0 refills | Status: DC

## 2020-12-17 NOTE — Telephone Encounter (Signed)
Patient left a voicemail requesting prescription refill of Arava to be snt to [facility] at [address].  Patient states she is out of medication.

## 2020-12-17 NOTE — Telephone Encounter (Signed)
Next Visit: 02/04/2021  Last Visit: 10/01/2020  Last Fill: 08/27/2020  DX: Rheumatoid arthritis of multiple sites without organ or system involvement with positive rheumatoid factor  Current Dose per office note 10/01/2020: Arava 20 mg 1 tablet daily  Labs: 10/13/2020 Albumin 3.3, RBC 3.61, Hgb 10.2, Hct 32.2  Okay to refill Arava?

## 2020-12-22 DIAGNOSIS — M25511 Pain in right shoulder: Secondary | ICD-10-CM | POA: Diagnosis not present

## 2020-12-22 DIAGNOSIS — M545 Low back pain, unspecified: Secondary | ICD-10-CM | POA: Diagnosis not present

## 2020-12-29 DIAGNOSIS — M545 Low back pain, unspecified: Secondary | ICD-10-CM | POA: Diagnosis not present

## 2020-12-29 DIAGNOSIS — M25511 Pain in right shoulder: Secondary | ICD-10-CM | POA: Diagnosis not present

## 2020-12-30 ENCOUNTER — Other Ambulatory Visit (HOSPITAL_COMMUNITY): Payer: Self-pay

## 2021-01-01 ENCOUNTER — Other Ambulatory Visit (HOSPITAL_COMMUNITY): Payer: Self-pay

## 2021-01-06 ENCOUNTER — Other Ambulatory Visit (HOSPITAL_COMMUNITY): Payer: Self-pay

## 2021-01-09 ENCOUNTER — Other Ambulatory Visit (HOSPITAL_BASED_OUTPATIENT_CLINIC_OR_DEPARTMENT_OTHER): Payer: Self-pay

## 2021-01-12 ENCOUNTER — Telehealth: Payer: Self-pay | Admitting: Rheumatology

## 2021-01-12 NOTE — Telephone Encounter (Signed)
Patient called this evening.  She states her right shoulder pain is worse.  She did not notice any improvement from physical therapy.  She would like evaluation of her right shoulder.  Please a schedule an appointment on Wednesday afternoon.  She is on Orencia.  We will obtain x-ray of her right shoulder joint and also will get labs at the follow-up visit. Bo Merino, MD

## 2021-01-13 ENCOUNTER — Encounter: Payer: Self-pay | Admitting: Hematology

## 2021-01-13 NOTE — Progress Notes (Signed)
Office Visit Note  Patient: Felicia Gonzalez             Date of Birth: [birth year]           MRN: [id number]             PCP: Kathyrn Lass, MD Referring: Kathyrn Lass, MD Visit Date: 01/14/2021 Occupation: @GUAROCC @  Subjective:  Right shoulder pain.   History of Present Illness: Felicia Gonzalez is a 80 y.o. female with a history of rheumatoid arthritis, osteoarthritis and degenerative disc disease.  She states that she continues to have pain and discomfort in her right shoulder joint.  She tried physical therapy without any relief.  She could going for physical therapy as she did not notice much improvement.  None of the other joints are swelling.  She has been taking Orencia injections every 7 days and Areva 20 mg p.o. daily.  She is still on prednisone 5 mg p.o. daily.  None of the other joints are swollen or painful.  She states she was evaluated by Dr. Durward Fortes for lower back pain and had x-rays.  She was diagnosed with disc disease and sciatica.  She continues to have some discomfort in her left hip and left lower lumbar region.  Activities of Daily Living:  Patient reports morning stiffness for 0 minutes.   Patient Denies nocturnal pain.  Difficulty dressing/grooming: Reports Difficulty climbing stairs: Denies Difficulty getting out of chair: Denies Difficulty using hands for taps, buttons, cutlery, and/or writing: Reports  Review of Systems  Constitutional:  Positive for fatigue.  HENT:  Negative for mouth sores, mouth dryness and nose dryness.   Eyes:  Negative for pain, itching, visual disturbance and dryness.  Respiratory:  Negative for cough, hemoptysis, shortness of breath and difficulty breathing.   Cardiovascular:  Negative for chest pain, palpitations and swelling in legs/feet.  Gastrointestinal:  Positive for constipation. Negative for abdominal pain, blood in stool and diarrhea.  Endocrine: Negative for increased urination.  Genitourinary:  Negative for painful  urination.  Musculoskeletal:  Positive for joint pain, joint pain, myalgias, morning stiffness and myalgias. Negative for joint swelling, muscle weakness and muscle tenderness.  Skin:  Negative for color change, rash and redness.  Allergic/Immunologic: Negative for susceptible to infections.  Neurological:  Negative for dizziness, numbness, headaches, memory loss and weakness.  Hematological:  Negative for swollen glands.  Psychiatric/Behavioral:  Negative for confusion and sleep disturbance.    PMFS History:  Patient Active Problem List   Diagnosis Date Noted   Low back pain 07/02/2020   Latent tuberculosis by blood test 08/09/2018   Malignant neoplasm of lower-inner quadrant of left breast in female, estrogen receptor positive ([place]) 10/04/2016   Osteoporosis 10/04/2016   High risk medication use 06/08/2016   Age-related osteoporosis without current pathological fracture 06/08/2016   ANA positive 06/08/2016   Moderate persistent asthma 06/08/2016   Normocytic anemia 10/01/2014   Rheumatoid arthritis of multiple sites without organ or system involvement with positive rheumatoid factor ([place]) 10/01/2014   Subcapital fracture of left hip ([place]) 10/01/2014    Past Medical History:  Diagnosis Date   Anemia    Arthritis    rheumatoid +CCP neg RF +ANA synovitis    Asthma    Breast cancer ([place]) 2018   Left Breast Cancer   Cancer ([place])    Breast cancer   Glaucoma    right eye   Osteoporosis    Personal history of radiation therapy 09/2016   Left Breast Cancer  Family History  Problem Relation Age of Onset   Hypertension Father    Past Surgical History:  Procedure Laterality Date   BREAST BIOPSY Left 05/14/2016   x2   BREAST BIOPSY Right 05/14/2016   BREAST LUMPECTOMY Left 07/09/2016   BREAST LUMPECTOMY WITH RADIOACTIVE SEED AND SENTINEL LYMPH NODE BIOPSY Left 07/09/2016   Procedure: BREAST LUMPECTOMY WITH RADIOACTIVE SEED AND SENTINEL LYMPH NODE BIOPSY;  Surgeon: Coralie Keens, MD;  Location: [facility];  Service: General;  Laterality: Left;   NO PAST SURGERIES     TOTAL HIP ARTHROPLASTY Left 10/01/2014   Procedure: TOTAL HIP ARTHROPLASTY;  Surgeon: Garald Balding, MD;  Location: [facility];  Service: Orthopedics;  Laterality: Left;   Social History   Social History Narrative   Not on file   Immunization History  Administered Date(s) Administered   Influenza Split 03/02/2010   Influenza, High Dose Seasonal PF 04/11/2017, 04/17/2018, 01/30/2019   Influenza,inj,Quad PF,6+ Mos 02/06/2016   Influenza,inj,quad, With Preservative 05/03/2014   Influenza-Unspecified 02/09/2013   PFIZER Comirnaty(Gray Top)Covid-19 Tri-Sucrose Vaccine 09/23/2020   PFIZER(Purple Top)SARS-COV-2 Vaccination 06/20/2019, 07/11/2019, 02/06/2020, 02/18/2020, 09/23/2020   Pneumococcal Conjugate-13 05/03/2014   Pneumococcal Polysaccharide-23 08/01/1998, 08/10/2006, 10/02/2010   Td 10/08/1999   Tdap 10/02/2010   Zoster, Live 10/08/2020     Objective: Vital Signs: BP (!) 142/87 (BP Location: Left Arm, Patient Position: Sitting, Cuff Size: Normal)   Pulse (!) 102   Ht 5' (1.524 m)   Wt 137 lb (62.1 kg)   BMI 26.76 kg/m    Physical Exam Vitals and nursing note reviewed.  Constitutional:      Appearance: She is well-developed.  HENT:     Head: Normocephalic and atraumatic.  Eyes:     Conjunctiva/sclera: Conjunctivae normal.  Cardiovascular:     Rate and Rhythm: Normal rate and regular rhythm.     Heart sounds: Normal heart sounds.  Pulmonary:     Effort: Pulmonary effort is normal.     Breath sounds: Normal breath sounds.  Abdominal:     General: Bowel sounds are normal.     Palpations: Abdomen is soft.  Musculoskeletal:     Cervical back: Normal range of motion.  Lymphadenopathy:     Cervical: No cervical adenopathy.  Skin:    General: Skin is warm and dry.     Capillary Refill: Capillary refill takes less than 2 seconds.  Neurological:     Mental Status: She is alert  and oriented to person, place, and time.  Psychiatric:        Behavior: Behavior normal.     Musculoskeletal Exam: C-spine was in good range of motion.  Right shoulder joint abduction was limited to about 100 degrees.  Left shoulder joint abduction was 160 degrees without much discomfort.  Elbow joints were in good range of motion.  She had no synovitis of her wrist joints or MCPs.  Mild PIP and DIP prominence was noted.  She had limited range of motion of her left hip joint which is replaced.  Right hip joint with good range of motion.  Knee joints in good range of motion without any warmth swelling or effusion.  There was no tenderness over ankles or MTPs.  CDAI Exam: CDAI Score: 1.8  Patient Global: 4 mm; Provider Global: 4 mm Swollen: 0 ; Tender: 1  Joint Exam 01/14/2021      Right  Left  Glenohumeral   Tender        Investigation: No additional findings.  Imaging:  No results found.  Recent Labs: Lab Results  Component Value Date   WBC 5.7 10/13/2020   HGB 10.2 (L) 10/13/2020   PLT 263 10/13/2020   NA 139 10/13/2020   K 4.1 10/13/2020   CL 104 10/13/2020   CO2 29 10/13/2020   GLUCOSE 89 10/13/2020   BUN 13 10/13/2020   CREATININE 0.73 10/13/2020   BILITOT 0.3 10/13/2020   ALKPHOS 65 10/13/2020   AST 32 10/13/2020   ALT 26 10/13/2020   PROT 6.7 10/13/2020   ALBUMIN 3.3 (L) 10/13/2020   CALCIUM 9.3 10/13/2020   GFRAA 97 07/16/2020   QFTBGOLD Negative 03/10/2016   QFTBGOLDPLUS POSITIVE (A) 07/21/2018    Speciality Comments: Orencia IV every 4 weeks  Procedures:  No procedures performed Allergies: Penicillins and Sulfa antibiotics   Assessment / Plan:     Visit Diagnoses: Rheumatoid arthritis of multiple sites without organ or system involvement with positive rheumatoid factor (HCC) - Positive RF, positive anti-CCP, positive ANA, elevated ESR.  She continues to have pain and discomfort in right shoulder joint.  None of the other joints are swollen.  She states  she is unable to taper prednisone below 5 mg due to asthma.  She has been taking [place] on a regular basis.  She had no synovitis on my examination today.  High risk medication use - Orencia 125 mg subq every 7 days (resumed May 2020), Arava 20 mg 1 tablet daily, and prednisone 5 mg 1 tablet daily. - Plan: CBC with Differential/Platelet, COMPLETE METABOLIC PANEL WITH GFR today and then every 3 months to monitor for drug toxicity.  Chest x-ray PA and lateral was in February 2022.  He is planning to get COVID-19 booster.  Instructions regarding the immunization was placed in the AVS.  She is advised to hold Orencia and leflunomide in case she develops an infection and resume after infection resolves.  Chronic right shoulder pain -she has been having off-and-on discomfort in her shoulder joint.  She states she has had persistent pain in her right shoulder joint since the pinning of this year.  She wanted physical therapy referral which was made in January.  She went through physical therapy but just stopped going there as she was experiencing increased pain.  Today she comes with increased pain and discomfort in her right shoulder joint.  Plan: XR Shoulder Right, x-ray of the shoulder joint showed severe glenohumeral joint space narrowing most likely due to rheumatoid arthritis.  I detailed discussion with the patient.  She has ongoing pain and limited range of motion.  She may benefit from total shoulder replacement.  I will make a referral to orthopedics.  Ambulatory referral to Orthopedics  Current chronic use of systemic steroids-she is unable to taper prednisone.  She has been on prednisone for many years.  She was initially taking it for asthma and then for rheumatoid arthritis.  She states that her asthma gets worse when she gets off prednisone.  DDD (degenerative disc disease), lumbar - She states she had x-rays done by Dr. Durward Fortes recently which I reviewed.  X-rays were consistent with  degenerative disc disease and facet joint arthropathy.  She continues to have some lower back pain.  She has tried physical therapy for lower back.  She also brought a TENS unit with her today as she wanted to learn how to use it.  Positive QuantiFERON-TB Gold test - Chest x-ray July 02, 2020 did not show any active disease.  ANA positive -  No clinical features of autoimmune disease  Age-related osteoporosis without current pathological fracture - DEXA September 26, 2019 T score -2.8.  She is on Prolia subcu by oncology.  Malignant neoplasm of upper-inner quadrant of left breast in female, estrogen receptor positive ([place])  History of anemia-she is followed by Dr. Irene Limbo.  History of asthma  Orders: Orders Placed This Encounter  Procedures   XR Shoulder Right   CBC with Differential/Platelet   COMPLETE METABOLIC PANEL WITH GFR   Ambulatory referral to Orthopedics   No orders of the defined types were placed in this encounter.  .  Follow-Up Instructions: Return in about 5 months (around 06/16/2021) for Rheumatoid arthritis.   Bo Merino, MD  Note - This record has been created using Editor, commissioning.  Chart creation errors have been sought, but may not always  have been located. Such creation errors do not reflect on  the standard of medical care.

## 2021-01-14 ENCOUNTER — Encounter: Payer: Self-pay | Admitting: Rheumatology

## 2021-01-14 ENCOUNTER — Other Ambulatory Visit: Payer: Self-pay

## 2021-01-14 ENCOUNTER — Ambulatory Visit: Payer: Self-pay

## 2021-01-14 ENCOUNTER — Ambulatory Visit: Payer: Medicare Other

## 2021-01-14 ENCOUNTER — Ambulatory Visit (INDEPENDENT_AMBULATORY_CARE_PROVIDER_SITE_OTHER): Payer: Medicare Other | Admitting: Rheumatology

## 2021-01-14 VITALS — BP 142/87 | HR 102 | Ht 60.0 in | Wt 137.0 lb

## 2021-01-14 DIAGNOSIS — G8929 Other chronic pain: Secondary | ICD-10-CM

## 2021-01-14 DIAGNOSIS — M25511 Pain in right shoulder: Secondary | ICD-10-CM

## 2021-01-14 DIAGNOSIS — C50212 Malignant neoplasm of upper-inner quadrant of left female breast: Secondary | ICD-10-CM | POA: Diagnosis not present

## 2021-01-14 DIAGNOSIS — R7612 Nonspecific reaction to cell mediated immunity measurement of gamma interferon antigen response without active tuberculosis: Secondary | ICD-10-CM

## 2021-01-14 DIAGNOSIS — R768 Other specified abnormal immunological findings in serum: Secondary | ICD-10-CM | POA: Diagnosis not present

## 2021-01-14 DIAGNOSIS — Z7952 Long term (current) use of systemic steroids: Secondary | ICD-10-CM

## 2021-01-14 DIAGNOSIS — Z79899 Other long term (current) drug therapy: Secondary | ICD-10-CM

## 2021-01-14 DIAGNOSIS — M0579 Rheumatoid arthritis with rheumatoid factor of multiple sites without organ or systems involvement: Secondary | ICD-10-CM

## 2021-01-14 DIAGNOSIS — Z170 Estrogen receptor positive status [ER+]: Secondary | ICD-10-CM

## 2021-01-14 DIAGNOSIS — Z8709 Personal history of other diseases of the respiratory system: Secondary | ICD-10-CM | POA: Diagnosis not present

## 2021-01-14 DIAGNOSIS — M810 Age-related osteoporosis without current pathological fracture: Secondary | ICD-10-CM | POA: Diagnosis not present

## 2021-01-14 DIAGNOSIS — M5136 Other intervertebral disc degeneration, lumbar region: Secondary | ICD-10-CM | POA: Diagnosis not present

## 2021-01-14 DIAGNOSIS — Z862 Personal history of diseases of the blood and blood-forming organs and certain disorders involving the immune mechanism: Secondary | ICD-10-CM

## 2021-01-14 DIAGNOSIS — M67911 Unspecified disorder of synovium and tendon, right shoulder: Secondary | ICD-10-CM

## 2021-01-14 LAB — CBC WITH DIFFERENTIAL/PLATELET
Absolute Monocytes: 803 cells/uL (ref 200–950)
Basophils Absolute: 73 cells/uL (ref 0–200)
Basophils Relative: 1 %
Eosinophils Absolute: 131 cells/uL (ref 15–500)
Eosinophils Relative: 1.8 %
HCT: 36 % (ref 35.0–45.0)
Hemoglobin: 11.3 g/dL — ABNORMAL LOW (ref 11.7–15.5)
Lymphs Abs: 1978 cells/uL (ref 850–3900)
MCH: 27.6 pg (ref 27.0–33.0)
MCHC: 31.4 g/dL — ABNORMAL LOW (ref 32.0–36.0)
MCV: 87.8 fL (ref 80.0–100.0)
MPV: 10.2 fL (ref 7.5–12.5)
Monocytes Relative: 11 %
Neutro Abs: 4314 cells/uL (ref 1500–7800)
Neutrophils Relative %: 59.1 %
Platelets: 362 10*3/uL (ref 140–400)
RBC: 4.1 10*6/uL (ref 3.80–5.10)
RDW: 14.6 % (ref 11.0–15.0)
Total Lymphocyte: 27.1 %
WBC: 7.3 10*3/uL (ref 3.8–10.8)

## 2021-01-14 LAB — COMPLETE METABOLIC PANEL WITH GFR
AG Ratio: 1.6 (calc) (ref 1.0–2.5)
ALT: 26 U/L (ref 6–29)
AST: 29 U/L (ref 10–35)
Albumin: 4.2 g/dL (ref 3.6–5.1)
Alkaline phosphatase (APISO): 70 U/L (ref 37–153)
BUN: 15 mg/dL (ref 7–25)
CO2: 31 mmol/L (ref 20–32)
Calcium: 9.4 mg/dL (ref 8.6–10.4)
Chloride: 101 mmol/L (ref 98–110)
Creat: 0.74 mg/dL (ref 0.60–1.00)
Globulin: 2.6 g/dL (calc) (ref 1.9–3.7)
Glucose, Bld: 93 mg/dL (ref 65–99)
Potassium: 5.1 mmol/L (ref 3.5–5.3)
Sodium: 137 mmol/L (ref 135–146)
Total Bilirubin: 0.3 mg/dL (ref 0.2–1.2)
Total Protein: 6.8 g/dL (ref 6.1–8.1)
eGFR: 82 mL/min/{1.73_m2} (ref >=60)

## 2021-01-14 NOTE — Patient Instructions (Addendum)
Standing Labs We placed an order today for your standing lab work.   Please have your standing labs drawn in November and every 3 months  If possible, please have your labs drawn 2 weeks prior to your appointment so that the provider can discuss your results at your appointment.  Please note that you may see your imaging and lab results in [facility] before we have reviewed them. We may be awaiting multiple results to interpret others before contacting you. Please allow our office up to 72 hours to thoroughly review all of the results before contacting the office for clarification of your results.  We have open lab daily: Monday through Thursday from 1:30-4:30 PM and Friday from 1:30-4:00 PM at the office of Dr. Bo Merino, [facility].   Please be advised, all patients with office appointments requiring lab work will take precedent over walk-in lab work.  If possible, please come for your lab work on Monday and Friday afternoons, as you may experience shorter wait times. The office is located at [address], [place], [place], [place] [address] No appointment is necessary.   Labs are drawn by [facility]. Please bring your co-pay at the time of your lab draw.  You may receive a bill from [facility] for your lab work.  If you wish to have your labs drawn at another location, please call the office 24 hours in advance to send orders.  If you have any questions regarding directions or hours of operation,  please call [phone].   As a reminder, please drink plenty of water prior to coming for your lab work. Thanks!   Vaccines You are taking a medication(s) that can suppress your immune system.  The following immunizations are recommended: Flu annually Covid-19  Td/Tdap (tetanus, diphtheria, pertussis) every 10 years Pneumonia (Prevnar 15 then Pneumovax 23 at least 1 year apart.  Alternatively, can take Prevnar 20 without needing additional dose) Shingrix (after age 28): 2  doses from 4 weeks to 6 months apart  Please check with your PCP to make sure you are up to date.   COVID-19 vaccine recommendations:   COVID-19 vaccine is recommended for everyone (unless you are allergic to a vaccine component), even if you are on a medication that suppresses your immune system.   If you are on Methotrexate, Cellcept (mycophenolate), Rinvoq, Morrie Sheldon, and Olumiant- hold the medication for 1 week after each vaccine. Hold Methotrexate for 2 weeks after the single dose COVID-19 vaccine.   If you are on Orencia subcutaneous injection - hold medication one week prior to and one week after the first COVID-19 vaccine dose (only).   If you are on Orencia IV infusions- time vaccination administration so that the first COVID-19 vaccination will occur four weeks after the infusion and postpone the subsequent infusion by one week.   If you are on Cyclophosphamide or Rituxan infusions please contact your doctor prior to receiving the COVID-19 vaccine.   Do not take Tylenol or any anti-inflammatory medications (NSAIDs) 24 hours prior to the COVID-19 vaccination.   There is no direct evidence about the efficacy of the COVID-19 vaccine in individuals who are on medications that suppress the immune system.   Even if you are fully vaccinated, and you are on any medications that suppress your immune system, please continue to wear a mask, maintain at least six feet social distance and practice hand hygiene.   If you develop a COVID-19 infection, please contact your PCP or our office to determine if  you need monoclonal antibody infusion.  The booster vaccine is now available for immunocompromised patients.   Please see the following web sites for updated information.   [email or web address]-Patient-[email or web address]  If you test POSITIVE for COVID19 and have MILD to MODERATE symptoms: First, call your PCP if you would like to receive COVID19  treatment AND Hold your medications during the infection and for at least 1 week after your symptoms have resolved: Injectable medication (Benlysta, Cimzia, Cosentyx, Enbrel, Humira, Orencia, Remicade, Simponi, Stelara, Taltz, Tremfya) Methotrexate Leflunomide (Arava) Azathioprine Mycophenolate (Cellcept) Roma Kayser, or Rinvoq Otezla If you take Actemra or Kevzara, you DO NOT need to hold these for COVID19 infection.  If you test POSITIVE for COVID19 and have NO symptoms: First, call your PCP if you would like to receive COVID19 treatment AND Hold your medications for at least 10 days after the day that you tested positive Injectable medication (Benlysta, Cimzia, Cosentyx, Enbrel, Humira, Orencia, Remicade, Simponi, Stelara, Taltz, Tremfya) Methotrexate Leflunomide (Arava) Azathioprine Mycophenolate (Cellcept) Roma Kayser, or Rinvoq Otezla If you take Actemra or Kevzara, you DO NOT need to hold these for COVID19 infection.  If you have signs or symptoms of an infection or start antibiotics: First, call your PCP for workup of your infection. Hold your medication through the infection, until you complete your antibiotics, and until symptoms resolve if you take the following: Injectable medication (Actemra, Benlysta, Cimzia, Cosentyx, Enbrel, Humira, Kevzara, Orencia, Remicade, Simponi, Stelara, Taltz, Tremfya) Methotrexate Leflunomide (Arava) Mycophenolate (Cellcept) Morrie Sheldon, Olumiant, or Rinvoq   Heart Disease Prevention   Your inflammatory disease increases your risk of heart disease which includes heart attack, stroke, atrial fibrillation (irregular heartbeats), high blood pressure, heart failure and atherosclerosis (plaque in the arteries).  It is important to reduce your risk by:   Keep blood pressure, cholesterol, and blood sugar at healthy levels   Smoking Cessation   Maintain a healthy weight  BMI 20-25   Eat a healthy diet  Plenty of fresh fruit,  vegetables, and whole grains  Limit saturated fats, foods high in sodium, and added sugars  DASH and Mediterranean diet   Increase physical activity  Recommend moderate physically activity for 150 minutes per week/ 30 minutes a day for five days a week These can be broken up into three separate ten-minute sessions during the day.   Reduce Stress  Meditation, slow breathing exercises, yoga, coloring books  Dental visits twice a year

## 2021-01-15 NOTE — Progress Notes (Signed)
Hemoglobin is better at 11.3, CMP is normal.

## 2021-01-26 ENCOUNTER — Ambulatory Visit
Admission: RE | Admit: 2021-01-26 | Discharge: 2021-01-26 | Disposition: A | Discharge status: Home / Self Care | Payer: Medicare Other | Source: Ambulatory Visit | Attending: Family Medicine | Admitting: Family Medicine

## 2021-01-26 ENCOUNTER — Other Ambulatory Visit: Payer: Self-pay

## 2021-01-26 DIAGNOSIS — Z1231 Encounter for screening mammogram for malignant neoplasm of breast: Secondary | ICD-10-CM | POA: Diagnosis not present

## 2021-01-29 ENCOUNTER — Other Ambulatory Visit (HOSPITAL_COMMUNITY): Payer: Self-pay

## 2021-01-30 DIAGNOSIS — R739 Hyperglycemia, unspecified: Secondary | ICD-10-CM | POA: Diagnosis not present

## 2021-01-30 DIAGNOSIS — E7800 Pure hypercholesterolemia, unspecified: Secondary | ICD-10-CM | POA: Diagnosis not present

## 2021-02-01 DIAGNOSIS — M79604 Pain in right leg: Secondary | ICD-10-CM | POA: Diagnosis not present

## 2021-02-01 DIAGNOSIS — S022XXA Fracture of nasal bones, initial encounter for closed fracture: Secondary | ICD-10-CM | POA: Diagnosis not present

## 2021-02-01 DIAGNOSIS — R0781 Pleurodynia: Secondary | ICD-10-CM | POA: Diagnosis not present

## 2021-02-01 DIAGNOSIS — W010XXA Fall on same level from slipping, tripping and stumbling without subsequent striking against object, initial encounter: Secondary | ICD-10-CM | POA: Diagnosis not present

## 2021-02-03 ENCOUNTER — Telehealth: Payer: Self-pay | Admitting: Rheumatology

## 2021-02-03 NOTE — Telephone Encounter (Signed)
Patient left a message, stating she is on an antibiotic right now. Patient needs to know what the instructions for her Maureen Chatters are due to this. Please call to advise.

## 2021-02-03 NOTE — Telephone Encounter (Signed)
Attempted to contact the patient and left message for patient to call the office.  

## 2021-02-04 ENCOUNTER — Other Ambulatory Visit (HOSPITAL_COMMUNITY): Payer: Self-pay

## 2021-02-04 ENCOUNTER — Ambulatory Visit: Payer: Medicare Other | Admitting: Rheumatology

## 2021-02-04 NOTE — Telephone Encounter (Signed)
She should ask her dentist about the infection and if there is no infection then she may get IV Orencia and restart Arava.

## 2021-02-04 NOTE — Telephone Encounter (Signed)
Attempted to contact the patient and left message for patient to call the office.  

## 2021-02-04 NOTE — Telephone Encounter (Signed)
Patient advised she should ask her dentist about infection and if there is no infection then she may restart Orencia and restart [place]. Patient advised to hold her medication until she has been cleared that there is no infection. Patient expressed understanding.

## 2021-02-04 NOTE — Telephone Encounter (Signed)
Patient states she feel on her walkway last week. Patient states she was taken to the dentist because she broke her teeth. Patient states she was given an antibiotic. Patient states she would like to know if she can take her [place] because she has no infection.  Patient will be getting her shingles vaccine and her Covid booster next week. Please advise.

## 2021-02-17 ENCOUNTER — Telehealth: Payer: Self-pay | Admitting: *Deleted

## 2021-02-17 DIAGNOSIS — Z7184 Encounter for health counseling related to travel: Secondary | ICD-10-CM | POA: Diagnosis not present

## 2021-02-17 NOTE — Telephone Encounter (Signed)
Patient states she spoke to Dr. Estanislado Pandy regarding restarting her Orencia injection.

## 2021-02-17 NOTE — Telephone Encounter (Signed)
Patient contacted the office requesting a call back regarding Orencia. Attempted to contact the patient and left message for patient to call the office.

## 2021-02-25 ENCOUNTER — Other Ambulatory Visit: Payer: Self-pay

## 2021-02-25 ENCOUNTER — Other Ambulatory Visit: Payer: Self-pay | Admitting: Physician Assistant

## 2021-02-25 ENCOUNTER — Other Ambulatory Visit: Payer: Self-pay | Admitting: *Deleted

## 2021-02-25 ENCOUNTER — Other Ambulatory Visit (HOSPITAL_COMMUNITY): Payer: Self-pay

## 2021-02-25 ENCOUNTER — Telehealth: Payer: Self-pay | Admitting: Pharmacy Technician

## 2021-02-25 DIAGNOSIS — C50312 Malignant neoplasm of lower-inner quadrant of left female breast: Secondary | ICD-10-CM

## 2021-02-25 DIAGNOSIS — Z170 Estrogen receptor positive status [ER+]: Secondary | ICD-10-CM

## 2021-02-25 MED ORDER — LEFLUNOMIDE 20 MG PO TABS: 20.0000 mg | ORAL_TABLET | Freq: Every day | ORAL | 0 refills | Status: DC

## 2021-02-25 MED ORDER — PREDNISONE 5 MG PO TABS: 5.0000 mg | ORAL_TABLET | Freq: Every day | ORAL | 0 refills | Status: DC

## 2021-02-25 MED ORDER — RALOXIFENE HCL 60 MG PO TABS: 60.0000 mg | ORAL_TABLET | Freq: Every day | ORAL | 3 refills | Status: DC

## 2021-02-25 MED ORDER — ORENCIA 125 MG/ML ~~LOC~~ SOSY
125.0000 mg | PREFILLED_SYRINGE | SUBCUTANEOUS | 0 refills | Status: DC
  Filled 2021-02-25: qty 12, 84d supply, fill #0
  Filled 2021-02-26: qty 4, 28d supply, fill #0
  Filled 2021-02-26: qty 4, 28d supply, fill #1
  Filled 2021-02-26: qty 4, 28d supply, fill #2

## 2021-02-25 NOTE — Telephone Encounter (Signed)
Patient contact the office stating she will be leaving the country next week [place] 2 months. Patient requesting refills on Arava and Prednisone.   Next Visit: 05/13/2021   Last Visit: 01/14/2021   Last Fill: 12/15/2020   VF:[id number] arthritis of multiple sites without organ or system involvement with positive rheumatoid factor   Current Dose per office note on 01/14/2021: Arava 20 mg 1 tablet daily, and prednisone 5 mg 1 tablet daily   Labs: 01/14/2021 Hemoglobin is better at 11.3, CMP is normal.  Okay to refill Prednisone and Arava?

## 2021-02-25 NOTE — Telephone Encounter (Signed)
Patient wanted 3 month supply due to going to [place]. Insurance is rejecting. Called pharmacy helpdesk, they state PA is required before override can be placed.  Submitted a Prior Authorization request to [facility] for [facility] via CoverMyMeds. Will update once we receive a response.   [name] - [id number]

## 2021-02-25 NOTE — Telephone Encounter (Signed)
Next Visit: 05/13/2021  Last Visit: 01/14/2021  Last Fill: 11/27/2020  VK:[id number] arthritis of multiple sites without organ or system involvement with positive rheumatoid factor  Current Dose per office note on 01/14/2021: Orencia 125 mg subq every 7 days (resumed May 2020),   Labs: 01/14/2021 Hemoglobin is better at 11.3, CMP is normal.  Yearly chest x-ray: 07/11/2020 No acute cardiopulmonary disease.  Okay to refill orencia?

## 2021-02-26 ENCOUNTER — Other Ambulatory Visit (HOSPITAL_COMMUNITY): Payer: Self-pay

## 2021-02-26 ENCOUNTER — Telehealth: Payer: Self-pay | Admitting: Hematology

## 2021-02-26 NOTE — Telephone Encounter (Signed)
R/s appt per sch msg. Called and left msg. Mailed printout

## 2021-02-26 NOTE — Telephone Encounter (Signed)
Received notification from [facility] regarding a prior authorization for [facility]. Authorization has been APPROVED from 05/31/20 to 05/31/21 (request that was placed yesterday was cancelled)  Authorization # [id number] Phone # [phone]  Attempted to run 84 day supply claim again, but rejecting for max 30 day supply.   Knox Saliva, PharmD, MPH, BCPS Clinical Pharmacist (Rheumatology and Pulmonology)

## 2021-02-26 NOTE — Telephone Encounter (Signed)
Pharmacy was able to process all 3 fills.

## 2021-02-26 NOTE — Telephone Encounter (Signed)
Called [employer] PA dept, they conferenced in the Pharmacy help desk who states patient is eligible for a 1-time vacation override. But 3 separate claims of 28 day supply must be processed to allow them to override.  Reached out to [facility] to advise.  Pharmacy [phone]

## 2021-03-02 ENCOUNTER — Other Ambulatory Visit (HOSPITAL_COMMUNITY): Payer: Self-pay

## 2021-03-04 ENCOUNTER — Other Ambulatory Visit (HOSPITAL_COMMUNITY): Payer: Self-pay

## 2021-04-13 ENCOUNTER — Ambulatory Visit: Payer: Medicare Other

## 2021-04-13 ENCOUNTER — Other Ambulatory Visit: Payer: Medicare Other

## 2021-04-13 ENCOUNTER — Ambulatory Visit: Payer: Medicare Other | Admitting: Hematology

## 2021-04-30 NOTE — Progress Notes (Deleted)
Office Visit Note  Patient: Felicia Gonzalez             Date of Birth: 12-27-1940           MRN: [id number]             PCP: Kathyrn Lass, MD Referring: Kathyrn Lass, MD Visit Date: 05/13/2021 Occupation: @GUAROCC @  Subjective:  No chief complaint on file.   History of Present Illness: Felicia Gonzalez is a 80 y.o. female ***   Activities of Daily Living:  Patient reports morning stiffness for *** {minute/hour:19697}.   Patient {ACTIONS;DENIES/REPORTS:21021675::"Denies"} nocturnal pain.  Difficulty dressing/grooming: {ACTIONS;DENIES/REPORTS:21021675::"Denies"} Difficulty climbing stairs: {ACTIONS;DENIES/REPORTS:21021675::"Denies"} Difficulty getting out of chair: {ACTIONS;DENIES/REPORTS:21021675::"Denies"} Difficulty using hands for taps, buttons, cutlery, and/or writing: {ACTIONS;DENIES/REPORTS:21021675::"Denies"}  No Rheumatology ROS completed.   PMFS History:  Patient Active Problem List   Diagnosis Date Noted   Low back pain 07/02/2020   Latent tuberculosis by blood test 08/09/2018   Malignant neoplasm of lower-inner quadrant of left breast in female, estrogen receptor positive ([place]) 10/04/2016   Osteoporosis 10/04/2016   High risk medication use 06/08/2016   Age-related osteoporosis without current pathological fracture 06/08/2016   ANA positive 06/08/2016   Moderate persistent asthma 06/08/2016   Normocytic anemia 10/01/2014   Rheumatoid arthritis of multiple sites without organ or system involvement with positive rheumatoid factor ([place]) 10/01/2014   Subcapital fracture of left hip ([place]) 10/01/2014    Past Medical History:  Diagnosis Date   Anemia    Arthritis    rheumatoid +CCP neg RF +ANA synovitis    Asthma    Breast cancer ([place]) 2018   Left Breast Cancer   Cancer ([place])    Breast cancer   Glaucoma    right eye   Osteoporosis    Personal history of radiation therapy 09/2016   Left Breast Cancer    Family History  Problem Relation Age of Onset    Hypertension Father    Past Surgical History:  Procedure Laterality Date   BREAST BIOPSY Left 05/14/2016   x2   BREAST BIOPSY Right 05/14/2016   BREAST LUMPECTOMY Left 07/09/2016   BREAST LUMPECTOMY WITH RADIOACTIVE SEED AND SENTINEL LYMPH NODE BIOPSY Left 07/09/2016   Procedure: BREAST LUMPECTOMY WITH RADIOACTIVE SEED AND SENTINEL LYMPH NODE BIOPSY;  Surgeon: Coralie Keens, MD;  Location: [facility];  Service: General;  Laterality: Left;   NO PAST SURGERIES     TOTAL HIP ARTHROPLASTY Left 10/01/2014   Procedure: TOTAL HIP ARTHROPLASTY;  Surgeon: Garald Balding, MD;  Location: [facility];  Service: Orthopedics;  Laterality: Left;   Social History   Social History Narrative   Not on file   Immunization History  Administered Date(s) Administered   Influenza Split 03/02/2010   Influenza, High Dose Seasonal PF 04/11/2017, 04/17/2018, 01/30/2019   Influenza,inj,Quad PF,6+ Mos 02/06/2016   Influenza,inj,quad, With Preservative 05/03/2014, 02/11/2021   Influenza-Unspecified 02/09/2013   PFIZER Comirnaty(Gray Top)Covid-19 Tri-Sucrose Vaccine 09/23/2020   PFIZER(Purple Top)SARS-COV-2 Vaccination 06/20/2019, 07/11/2019, 02/06/2020, 02/18/2020, 09/23/2020   Pfizer Covid-19 Vaccine Bivalent Booster 52yrs & up 02/11/2021   Pneumococcal Conjugate-13 05/03/2014   Pneumococcal Polysaccharide-23 08/01/1998, 08/10/2006, 10/02/2010   Td 10/08/1999   Tdap 10/02/2010   Zoster, Live 10/08/2020     Objective: Vital Signs: There were no vitals taken for this visit.   Physical Exam   Musculoskeletal Exam: ***  CDAI Exam: CDAI Score: -- Patient Global: --; Provider Global: -- Swollen: --; Tender: -- Joint Exam 05/13/2021   No joint exam has been  documented for this visit   There is currently no information documented on the homunculus. Go to the Rheumatology activity and complete the homunculus joint exam.  Investigation: No additional findings.  Imaging: No results found.  Recent Labs: Lab  Results  Component Value Date   WBC 7.3 01/14/2021   HGB 11.3 (L) 01/14/2021   PLT 362 01/14/2021   NA 137 01/14/2021   K 5.1 01/14/2021   CL 101 01/14/2021   CO2 31 01/14/2021   GLUCOSE 93 01/14/2021   BUN 15 01/14/2021   CREATININE 0.74 01/14/2021   BILITOT 0.3 01/14/2021   ALKPHOS 65 10/13/2020   AST 29 01/14/2021   ALT 26 01/14/2021   PROT 6.8 01/14/2021   ALBUMIN 3.3 (L) 10/13/2020   CALCIUM 9.4 01/14/2021   GFRAA 97 07/16/2020   QFTBGOLD Negative 03/10/2016   QFTBGOLDPLUS POSITIVE (A) 07/21/2018    Speciality Comments: Orencia IV every 4 weeks  Procedures:  No procedures performed Allergies: Penicillins and Sulfa antibiotics   Assessment / Plan:     Visit Diagnoses: No diagnosis found.  Orders: No orders of the defined types were placed in this encounter.  No orders of the defined types were placed in this encounter.   Face-to-face time spent with patient was *** minutes. Greater than 50% of time was spent in counseling and coordination of care.  Follow-Up Instructions: No follow-ups on file.   Earnestine Mealing, CMA  Note - This record has been created using Editor, commissioning.  Chart creation errors have been sought, but may not always  have been located. Such creation errors do not reflect on  the standard of medical care.

## 2021-05-11 ENCOUNTER — Other Ambulatory Visit: Payer: Self-pay

## 2021-05-11 DIAGNOSIS — C50312 Malignant neoplasm of lower-inner quadrant of left female breast: Secondary | ICD-10-CM

## 2021-05-11 DIAGNOSIS — Z170 Estrogen receptor positive status [ER+]: Secondary | ICD-10-CM

## 2021-05-11 DIAGNOSIS — D509 Iron deficiency anemia, unspecified: Secondary | ICD-10-CM

## 2021-05-12 ENCOUNTER — Inpatient Hospital Stay: Payer: Medicare Other | Attending: Family Medicine

## 2021-05-12 ENCOUNTER — Inpatient Hospital Stay: Payer: Medicare Other | Admitting: Hematology

## 2021-05-12 ENCOUNTER — Inpatient Hospital Stay: Payer: Medicare Other

## 2021-05-12 ENCOUNTER — Telehealth: Payer: Self-pay | Admitting: Hematology

## 2021-05-12 NOTE — Telephone Encounter (Signed)
Scheduled per sch msg. Called and left msg. Mailed printout  

## 2021-05-13 ENCOUNTER — Ambulatory Visit: Payer: Medicare Other | Admitting: Rheumatology

## 2021-05-13 ENCOUNTER — Other Ambulatory Visit (HOSPITAL_COMMUNITY): Payer: Self-pay

## 2021-05-13 DIAGNOSIS — M0579 Rheumatoid arthritis with rheumatoid factor of multiple sites without organ or systems involvement: Secondary | ICD-10-CM

## 2021-05-13 DIAGNOSIS — M810 Age-related osteoporosis without current pathological fracture: Secondary | ICD-10-CM

## 2021-05-13 DIAGNOSIS — Z862 Personal history of diseases of the blood and blood-forming organs and certain disorders involving the immune mechanism: Secondary | ICD-10-CM

## 2021-05-13 DIAGNOSIS — Z79899 Other long term (current) drug therapy: Secondary | ICD-10-CM

## 2021-05-13 DIAGNOSIS — R768 Other specified abnormal immunological findings in serum: Secondary | ICD-10-CM

## 2021-05-13 DIAGNOSIS — Z7952 Long term (current) use of systemic steroids: Secondary | ICD-10-CM

## 2021-05-13 DIAGNOSIS — R7612 Nonspecific reaction to cell mediated immunity measurement of gamma interferon antigen response without active tuberculosis: Secondary | ICD-10-CM

## 2021-05-13 DIAGNOSIS — Z8709 Personal history of other diseases of the respiratory system: Secondary | ICD-10-CM

## 2021-05-13 DIAGNOSIS — M5136 Other intervertebral disc degeneration, lumbar region: Secondary | ICD-10-CM

## 2021-05-13 DIAGNOSIS — G8929 Other chronic pain: Secondary | ICD-10-CM

## 2021-05-13 DIAGNOSIS — C50212 Malignant neoplasm of upper-inner quadrant of left female breast: Secondary | ICD-10-CM

## 2021-05-14 ENCOUNTER — Telehealth: Payer: Self-pay | Admitting: Pharmacy Technician

## 2021-05-14 NOTE — Telephone Encounter (Signed)
Submitted a Prior Authorization request to [facility] for [employer] via CoverMyMeds. Will update once we receive a response.   (KeyFrancesco Runner) - [id number]

## 2021-05-14 NOTE — Telephone Encounter (Signed)
Received notification from [facility] regarding a prior authorization for [facility]. Authorization has been APPROVED from 05/31/21 to 05/30/22.    Authorization # [id number]

## 2021-05-24 ENCOUNTER — Other Ambulatory Visit: Payer: Self-pay | Admitting: Physician Assistant

## 2021-05-27 ENCOUNTER — Other Ambulatory Visit (HOSPITAL_COMMUNITY): Payer: Self-pay

## 2021-05-29 ENCOUNTER — Other Ambulatory Visit (HOSPITAL_COMMUNITY): Payer: Self-pay

## 2021-05-29 ENCOUNTER — Other Ambulatory Visit: Payer: Self-pay | Admitting: Physician Assistant

## 2021-05-29 NOTE — Telephone Encounter (Signed)
Next Visit: due 04/2021, sent message to the front desk to schedule.   Last Visit: 01/14/2021  Last Fill: 02/25/2021  DX: Rheumatoid arthritis of multiple sites without organ or system involvement with positive rheumatoid factor  Current Dose per office note on 01/14/2021: Orencia 125 mg subq every 7 days  Labs: 01/14/2021 Hemoglobin is better at 11.3, CMP is normal.  Chest x-ray: 07/12/2020 No acute cardiopulmonary disease.

## 2021-06-01 ENCOUNTER — Other Ambulatory Visit (HOSPITAL_COMMUNITY): Payer: Self-pay

## 2021-06-03 ENCOUNTER — Telehealth: Payer: Self-pay | Admitting: Rheumatology

## 2021-06-03 NOTE — Telephone Encounter (Signed)
Patient left a voicemail stating she had spoken with someone regarding labs because they are due soon. Patient states she has questions and requests a return call.

## 2021-06-03 NOTE — Telephone Encounter (Signed)
Returned call to the patient. Patient states she is not in [place]. Patient states she will be able to get labs at the hospital. Patient is also interested in having the chest x-ray as well. Patient's states she will call to get the fax number and call back to provide so we can fax the orders.

## 2021-06-04 ENCOUNTER — Other Ambulatory Visit: Payer: Self-pay | Admitting: *Deleted

## 2021-06-04 DIAGNOSIS — R7612 Nonspecific reaction to cell mediated immunity measurement of gamma interferon antigen response without active tuberculosis: Secondary | ICD-10-CM

## 2021-06-04 DIAGNOSIS — Z79899 Other long term (current) drug therapy: Secondary | ICD-10-CM

## 2021-06-08 ENCOUNTER — Inpatient Hospital Stay: Payer: Medicare Other | Admitting: Hematology

## 2021-06-08 ENCOUNTER — Inpatient Hospital Stay: Payer: Medicare Other

## 2021-06-08 DIAGNOSIS — R918 Other nonspecific abnormal finding of lung field: Secondary | ICD-10-CM | POA: Diagnosis not present

## 2021-06-08 DIAGNOSIS — Z79899 Other long term (current) drug therapy: Secondary | ICD-10-CM | POA: Diagnosis not present

## 2021-06-09 ENCOUNTER — Telehealth: Payer: Self-pay | Admitting: Hematology

## 2021-06-09 NOTE — Telephone Encounter (Signed)
Sch per 1/9 inbasket, left msg

## 2021-06-11 ENCOUNTER — Telehealth: Payer: Self-pay | Admitting: *Deleted

## 2021-06-11 NOTE — Telephone Encounter (Signed)
Patient left message to ask if we received her lab results and chest x-rays. Patient advised we have not received the results and she will need to contact the lab and the radiology department where she had them completed and have them send the results. Patient advised we will need results prior to sending the [place]. Patient states her last injection of Orencia was 06/03/2021.

## 2021-06-16 ENCOUNTER — Other Ambulatory Visit (HOSPITAL_COMMUNITY): Payer: Self-pay

## 2021-06-16 ENCOUNTER — Telehealth: Payer: Self-pay | Admitting: *Deleted

## 2021-06-16 ENCOUNTER — Other Ambulatory Visit: Payer: Self-pay | Admitting: *Deleted

## 2021-06-16 MED ORDER — ORENCIA 125 MG/ML ~~LOC~~ SOSY
125.0000 mg | PREFILLED_SYRINGE | SUBCUTANEOUS | 0 refills | Status: DC
  Filled 2021-06-16: qty 4, 28d supply, fill #0
  Filled 2021-07-10: qty 4, 28d supply, fill #1
  Filled 2021-08-05: qty 4, 28d supply, fill #2

## 2021-06-16 NOTE — Telephone Encounter (Signed)
Next Visit: Due December 2022. Message sent to the front to schedule patient   Last Visit: 01/14/2021  Last Fill: 02/25/2021  PF:[id number] arthritis of multiple sites without organ or system involvement with positive rheumatoid factor   Current Dose per office note 01/14/2021: Orencia 125 mg subq every 7 days   Labs: 06/08/2021 Hgb 11.1, Hct 33.5, RDW 15.6, Absolute Monocytes 1038  Chest X-ray: 07/11/2020 No acute cardiopulmonary disease.  Okay to refill Orencia?

## 2021-06-16 NOTE — Telephone Encounter (Signed)
Labs received from:[facility]  Drawn on:06/08/2021  Reviewed by:Dr. Abel Presto   Labs drawn:CBC/CMP  Results: Hgb 11.1    Hct 33.5    RDW 15.6    Absolute Monocytes 1038  Patient on Orencia 125 mg/mL weekly and Arava 20 mg po daily.

## 2021-06-17 ENCOUNTER — Other Ambulatory Visit: Payer: Self-pay

## 2021-06-17 MED ORDER — LEFLUNOMIDE 20 MG PO TABS: 20.0000 mg | ORAL_TABLET | Freq: Every day | ORAL | 0 refills | Status: DC

## 2021-06-17 NOTE — Telephone Encounter (Signed)
Patient called requesting prescription refill of Arava to be sent to [facility] [place], [place]  [address].

## 2021-06-17 NOTE — Telephone Encounter (Signed)
Next Visit: Due December 2022. Message sent to the front to schedule patient    Last Visit: 01/14/2021   Last Fill: 02/25/2021   WT:[id number] arthritis of multiple sites without organ or system involvement with positive rheumatoid factor    Current Dose per office note 01/14/2021: Arava 20 mg 1 tablet daily  Labs: 06/08/2021 Hgb 11.1, Hct 33.5, RDW 15.6, Absolute Monocytes 1038  Okay to refill Arava?

## 2021-06-25 ENCOUNTER — Telehealth: Payer: Self-pay | Admitting: *Deleted

## 2021-06-25 MED ORDER — PREDNISONE 5 MG PO TABS: 5.0000 mg | ORAL_TABLET | Freq: Every day | ORAL | 0 refills | Status: DC

## 2021-06-25 NOTE — Telephone Encounter (Signed)
Patient advised we received her chest x-ray results. Patient advised the results showed no active disease in chest and calcified mass which was noted on previous chest x-rays. Patient has had work up.   Patient requesting refill on Prednisone.  Next Visit: 08/25/2021  Last Visit: 01/14/2021  Last Fill: 02/25/2021  Dx: Rheumatoid arthritis of multiple sites without organ or system involvement with positive rheumatoid factor   Current Dose per office note on 01/14/2021: prednisone 5 mg 1 tablet daily  Okay to refill Prednisone?

## 2021-06-29 ENCOUNTER — Other Ambulatory Visit: Payer: Medicare Other

## 2021-06-29 ENCOUNTER — Ambulatory Visit: Payer: Medicare Other | Admitting: Hematology

## 2021-07-06 ENCOUNTER — Other Ambulatory Visit (HOSPITAL_COMMUNITY): Payer: Self-pay

## 2021-07-08 ENCOUNTER — Other Ambulatory Visit (HOSPITAL_COMMUNITY): Payer: Self-pay

## 2021-07-10 ENCOUNTER — Other Ambulatory Visit (HOSPITAL_COMMUNITY): Payer: Self-pay

## 2021-07-13 ENCOUNTER — Other Ambulatory Visit (HOSPITAL_COMMUNITY): Payer: Self-pay

## 2021-07-20 ENCOUNTER — Other Ambulatory Visit (HOSPITAL_COMMUNITY): Payer: Self-pay

## 2021-08-05 ENCOUNTER — Other Ambulatory Visit (HOSPITAL_COMMUNITY): Payer: Self-pay

## 2021-08-06 NOTE — Progress Notes (Unsigned)
Office Visit Note  Patient: Felicia Gonzalez             Date of Birth: [birth year]           MRN: [id number]             PCP: Kathyrn Lass, MD Referring: Kathyrn Lass, MD Visit Date: 08/20/2021 Occupation: '@GUAROCC'$ @  Subjective:  No chief complaint on file.   History of Present Illness: Felicia Gonzalez is a 81 y.o. female ***   Activities of Daily Living:  Patient reports morning stiffness for *** {minute/hour:19697}.   Patient {ACTIONS;DENIES/REPORTS:21021675::"Denies"} nocturnal pain.  Difficulty dressing/grooming: {ACTIONS;DENIES/REPORTS:21021675::"Denies"} Difficulty climbing stairs: {ACTIONS;DENIES/REPORTS:21021675::"Denies"} Difficulty getting out of chair: {ACTIONS;DENIES/REPORTS:21021675::"Denies"} Difficulty using hands for taps, buttons, cutlery, and/or writing: {ACTIONS;DENIES/REPORTS:21021675::"Denies"}  No Rheumatology ROS completed.   PMFS History:  Patient Active Problem List   Diagnosis Date Noted   Low back pain 07/02/2020   Latent tuberculosis by blood test 08/09/2018   Malignant neoplasm of lower-inner quadrant of left breast in female, estrogen receptor positive ([place]) 10/04/2016   Osteoporosis 10/04/2016   High risk medication use 06/08/2016   Age-related osteoporosis without current pathological fracture 06/08/2016   ANA positive 06/08/2016   Moderate persistent asthma 06/08/2016   Normocytic anemia 10/01/2014   Rheumatoid arthritis of multiple sites without organ or system involvement with positive rheumatoid factor ([place]) 10/01/2014   Subcapital fracture of left hip ([place]) 10/01/2014    Past Medical History:  Diagnosis Date   Anemia    Arthritis    rheumatoid +CCP neg RF +ANA synovitis    Asthma    Breast cancer ([place]) 2018   Left Breast Cancer   Cancer ([place])    Breast cancer   Glaucoma    right eye   Osteoporosis    Personal history of radiation therapy 09/2016   Left Breast Cancer    Family History  Problem Relation Age of Onset    Hypertension Father    Past Surgical History:  Procedure Laterality Date   BREAST BIOPSY Left 05/14/2016   x2   BREAST BIOPSY Right 05/14/2016   BREAST LUMPECTOMY Left 07/09/2016   BREAST LUMPECTOMY WITH RADIOACTIVE SEED AND SENTINEL LYMPH NODE BIOPSY Left 07/09/2016   Procedure: BREAST LUMPECTOMY WITH RADIOACTIVE SEED AND SENTINEL LYMPH NODE BIOPSY;  Surgeon: Coralie Keens, MD;  Location: [facility];  Service: General;  Laterality: Left;   NO PAST SURGERIES     TOTAL HIP ARTHROPLASTY Left 10/01/2014   Procedure: TOTAL HIP ARTHROPLASTY;  Surgeon: Garald Balding, MD;  Location: [facility];  Service: Orthopedics;  Laterality: Left;   Social History   Social History Narrative   Not on file   Immunization History  Administered Date(s) Administered   Influenza Split 03/02/2010   Influenza, High Dose Seasonal PF 04/11/2017, 04/17/2018, 01/30/2019   Influenza,inj,Quad PF,6+ Mos 02/06/2016   Influenza,inj,quad, With Preservative 05/03/2014, 02/11/2021   Influenza-Unspecified 02/09/2013   PFIZER Comirnaty(Gray Top)Covid-19 Tri-Sucrose Vaccine 09/23/2020   PFIZER(Purple Top)SARS-COV-2 Vaccination 06/20/2019, 07/11/2019, 02/06/2020, 02/18/2020, 09/23/2020   Pfizer Covid-19 Vaccine Bivalent Booster 32yr & up 02/11/2021   Pneumococcal Conjugate-13 05/03/2014   Pneumococcal Polysaccharide-23 08/01/1998, 08/10/2006, 10/02/2010   Td 10/08/1999   Tdap 10/02/2010   Zoster, Live 10/08/2020     Objective: Vital Signs: There were no vitals taken for this visit.   Physical Exam   Musculoskeletal Exam: ***  CDAI Exam: CDAI Score: -- Patient Global: --; Provider Global: -- Swollen: --; Tender: -- Joint Exam 08/20/2021   No joint exam has been  documented for this visit   There is currently no information documented on the homunculus. Go to the Rheumatology activity and complete the homunculus joint exam.  Investigation: No additional findings.  Imaging: No results found.  Recent Labs: Lab  Results  Component Value Date   WBC 7.3 01/14/2021   HGB 11.3 (L) 01/14/2021   PLT 362 01/14/2021   NA 137 01/14/2021   K 5.1 01/14/2021   CL 101 01/14/2021   CO2 31 01/14/2021   GLUCOSE 93 01/14/2021   BUN 15 01/14/2021   CREATININE 0.74 01/14/2021   BILITOT 0.3 01/14/2021   ALKPHOS 65 10/13/2020   AST 29 01/14/2021   ALT 26 01/14/2021   PROT 6.8 01/14/2021   ALBUMIN 3.3 (L) 10/13/2020   CALCIUM 9.4 01/14/2021   GFRAA 97 07/16/2020   QFTBGOLD Negative 03/10/2016   QFTBGOLDPLUS POSITIVE (A) 07/21/2018    Speciality Comments: Orencia IV every 4 weeks  Procedures:  No procedures performed Allergies: Penicillins and Sulfa antibiotics   Assessment / Plan:     Visit Diagnoses: Rheumatoid arthritis of multiple sites without organ or system involvement with positive rheumatoid factor (HCC)  High risk medication use  Positive QuantiFERON-TB Gold test  Chronic right shoulder pain  Current chronic use of systemic steroids  DDD (degenerative disc disease), lumbar  ANA positive  Age-related osteoporosis without current pathological fracture  Malignant neoplasm of upper-inner quadrant of left breast in female, estrogen receptor positive (HCC)  History of anemia  History of asthma  Orders: No orders of the defined types were placed in this encounter.  No orders of the defined types were placed in this encounter.   Face-to-face time spent with patient was *** minutes. Greater than 50% of time was spent in counseling and coordination of care.  Follow-Up Instructions: No follow-ups on file.   Ofilia Neas, PA-C  Note - This record has been created using Dragon software.  Chart creation errors have been sought, but may not always  have been located. Such creation errors do not reflect on  the standard of medical care.

## 2021-08-11 ENCOUNTER — Other Ambulatory Visit (HOSPITAL_COMMUNITY): Payer: Self-pay

## 2021-08-20 ENCOUNTER — Other Ambulatory Visit: Payer: Self-pay

## 2021-08-20 ENCOUNTER — Encounter: Payer: Self-pay | Admitting: Rheumatology

## 2021-08-20 ENCOUNTER — Ambulatory Visit (INDEPENDENT_AMBULATORY_CARE_PROVIDER_SITE_OTHER): Payer: Medicare Other | Admitting: Rheumatology

## 2021-08-20 ENCOUNTER — Ambulatory Visit (INDEPENDENT_AMBULATORY_CARE_PROVIDER_SITE_OTHER): Payer: Medicare Other

## 2021-08-20 VITALS — BP 134/81 | HR 102 | Ht 60.0 in | Wt 129.2 lb

## 2021-08-20 DIAGNOSIS — Z7952 Long term (current) use of systemic steroids: Secondary | ICD-10-CM | POA: Diagnosis not present

## 2021-08-20 DIAGNOSIS — M810 Age-related osteoporosis without current pathological fracture: Secondary | ICD-10-CM | POA: Diagnosis not present

## 2021-08-20 DIAGNOSIS — Z862 Personal history of diseases of the blood and blood-forming organs and certain disorders involving the immune mechanism: Secondary | ICD-10-CM | POA: Diagnosis not present

## 2021-08-20 DIAGNOSIS — Z79899 Other long term (current) drug therapy: Secondary | ICD-10-CM | POA: Diagnosis not present

## 2021-08-20 DIAGNOSIS — M0579 Rheumatoid arthritis with rheumatoid factor of multiple sites without organ or systems involvement: Secondary | ICD-10-CM | POA: Diagnosis not present

## 2021-08-20 DIAGNOSIS — M7502 Adhesive capsulitis of left shoulder: Secondary | ICD-10-CM | POA: Diagnosis not present

## 2021-08-20 DIAGNOSIS — Z8709 Personal history of other diseases of the respiratory system: Secondary | ICD-10-CM

## 2021-08-20 DIAGNOSIS — R768 Other specified abnormal immunological findings in serum: Secondary | ICD-10-CM | POA: Diagnosis not present

## 2021-08-20 DIAGNOSIS — R7612 Nonspecific reaction to cell mediated immunity measurement of gamma interferon antigen response without active tuberculosis: Secondary | ICD-10-CM

## 2021-08-20 DIAGNOSIS — M7501 Adhesive capsulitis of right shoulder: Secondary | ICD-10-CM | POA: Diagnosis not present

## 2021-08-20 DIAGNOSIS — C50212 Malignant neoplasm of upper-inner quadrant of left female breast: Secondary | ICD-10-CM

## 2021-08-20 DIAGNOSIS — M5136 Other intervertebral disc degeneration, lumbar region: Secondary | ICD-10-CM

## 2021-08-20 DIAGNOSIS — G8929 Other chronic pain: Secondary | ICD-10-CM

## 2021-08-20 DIAGNOSIS — Z170 Estrogen receptor positive status [ER+]: Secondary | ICD-10-CM

## 2021-08-20 DIAGNOSIS — M25511 Pain in right shoulder: Secondary | ICD-10-CM | POA: Diagnosis not present

## 2021-08-20 DIAGNOSIS — R5381 Other malaise: Secondary | ICD-10-CM

## 2021-08-20 DIAGNOSIS — Z9181 History of falling: Secondary | ICD-10-CM

## 2021-08-20 MED ORDER — LIDOCAINE HCL 1 % IJ SOLN
1.5000 mL | INTRAMUSCULAR | Status: AC | PRN
  Administered 2021-08-20: 1.5 mL

## 2021-08-20 MED ORDER — TRIAMCINOLONE ACETONIDE 40 MG/ML IJ SUSP
40.0000 mg | INTRAMUSCULAR | Status: AC | PRN
  Administered 2021-08-20: 40 mg via INTRA_ARTICULAR

## 2021-08-20 NOTE — Patient Instructions (Addendum)
Please call your PCP to schedule a follow up for evaluation of headaches and dizziness and home health PT.  ? ? ?Standing Labs ?We placed an order today for your standing lab work.  ? ?Please have your standing labs drawn in April and every 3 months ? ?If possible, please have your labs drawn 2 weeks prior to your appointment so that the provider can discuss your results at your appointment. ? ?Please note that you may see your imaging and lab results in [facility] before we have reviewed them. ?We may be awaiting multiple results to interpret others before contacting you. ?Please allow our office up to 72 hours to thoroughly review all of the results before contacting the office for clarification of your results. ? ?We have open lab daily: ?Monday through Thursday from 1:30-4:30 PM and Friday from 1:30-4:00 PM ?at the office of Dr. Bo Merino, [facility].   ?Please be advised, all patients with office appointments requiring lab work will take precedent over walk-in lab work.  ?If possible, please come for your lab work on Monday and Friday afternoons, as you may experience shorter wait times. ?The office is located at [address], [place], [place], [place] [address] ?No appointment is necessary.   ?Labs are drawn by [facility]. Please bring your co-pay at the time of your lab draw.  You may receive a bill from [facility] for your lab work. ? ?Please note if you are on Hydroxychloroquine and and an order has been placed for a Hydroxychloroquine level, you will need to have it drawn 4 hours or more after your last dose. ? ?If you wish to have your labs drawn at another location, please call the office 24 hours in advance to send orders. ? ?If you have any questions regarding directions or hours of operation,  ?please call [phone].   ?As a reminder, please drink plenty of water prior to coming for your lab work. Thanks!  ? ?Vaccines ?You are taking a medication(s) that can suppress your immune system.   The following immunizations are recommended: ?Flu annually ?Covid-19  ?Td/Tdap (tetanus, diphtheria, pertussis) every 10 years ?Pneumonia (Prevnar 15 then Pneumovax 23 at least 1 year apart.  Alternatively, can take Prevnar 20 without needing additional dose) ?Shingrix: 2 doses from 4 weeks to 6 months apart ? ?Please check with your PCP to make sure you are up to date.  ? ?If you have signs or symptoms of an infection or start antibiotics: ?First, call your PCP for workup of your infection. ?Hold your medication through the infection, until you complete your antibiotics, and until symptoms resolve if you take the following: ?Injectable medication (Actemra, Benlysta, Cimzia, Cosentyx, Enbrel, Humira, Kevzara, Orencia, Remicade, Simponi, [place], [place], [place]) ?Methotrexate ?Leflunomide Jolee Ewing) ?Mycophenolate (Cellcept) ?Roma Kayser, or Rinvoq  ?Shoulder Exercises ?Ask your health care provider which exercises are safe for you. Do exercises exactly as told by your health care provider and adjust them as directed. It is normal to feel mild stretching, pulling, tightness, or discomfort as you do these exercises. Stop right away if you feel sudden pain or your pain gets worse. Do not begin these exercises until told by your health care provider. ?Stretching exercises ?External rotation and abduction ?This exercise is sometimes called corner stretch. This exercise rotates your arm outward (external rotation) and moves your arm out from your body (abduction). ?Stand in a doorway with one of your feet slightly in front of the other. This is called a staggered stance. If  you cannot reach your forearms to the door frame, stand facing a corner of a room. ?Choose one of the following positions as told by your health care provider: ?Place your hands and forearms on the door frame above your head. ?Place your hands and forearms on the door frame at the height of your head. ?Place your hands on the door frame at the  height of your elbows. ?Slowly move your weight onto your front foot until you feel a stretch across your chest and in the front of your shoulders. Keep your head and chest upright and keep your abdominal muscles tight. ?Hold for __________ seconds. ?To release the stretch, shift your weight to your back foot. ?Repeat __________ times. Complete this exercise __________ times a day. ?Extension, standing ?Stand and hold a broomstick, a cane, or a similar object behind your back. ?Your hands should be a little wider than shoulder width apart. ?Your palms should face away from your back. ?Keeping your elbows straight and your shoulder muscles relaxed, move the stick away from your body until you feel a stretch in your shoulders (extension). ?Avoid shrugging your shoulders while you move the stick. Keep your shoulder blades tucked down toward the middle of your back. ?Hold for __________ seconds. ?Slowly return to the starting position. ?Repeat __________ times. Complete this exercise __________ times a day. ?Range-of-motion exercises ?Pendulum ? ?Stand near a wall or a surface that you can hold onto for balance. ?Bend at the waist and let your left / right arm hang straight down. Use your other arm to support you. Keep your back straight and do not lock your knees. ?Relax your left / right arm and shoulder muscles, and move your hips and your trunk so your left / right arm swings freely. Your arm should swing because of the motion of your body, not because you are using your arm or shoulder muscles. ?Keep moving your hips and trunk so your arm swings in the following directions, as told by your health care provider: ?Side to side. ?Forward and backward. ?In clockwise and counterclockwise circles. ?Continue each motion for __________ seconds, or for as long as told by your health care provider. ?Slowly return to the starting position. ?Repeat __________ times. Complete this exercise __________ times a day. ?Shoulder  flexion, standing ? ?Stand and hold a broomstick, a cane, or a similar object. Place your hands a little more than shoulder width apart on the object. Your left / right hand should be palm up, and your other hand should be palm down. ?Keep your elbow straight and your shoulder muscles relaxed. Push the stick up with your healthy arm to raise your left / right arm in front of your body, and then over your head until you feel a stretch in your shoulder (flexion). ?Avoid shrugging your shoulder while you raise your arm. Keep your shoulder blade tucked down toward the middle of your back. ?Hold for __________ seconds. ?Slowly return to the starting position. ?Repeat __________ times. Complete this exercise __________ times a day. ?Shoulder abduction, standing ?Stand and hold a broomstick, a cane, or a similar object. Place your hands a little more than shoulder width apart on the object. Your left / right hand should be palm up, and your other hand should be palm down. ?Keep your elbow straight and your shoulder muscles relaxed. Push the object across your body toward your left / right side. Raise your left / right arm to the side of your body (abduction) until you  feel a stretch in your shoulder. ?Do not raise your arm above shoulder height unless your health care provider tells you to do that. ?If directed, raise your arm over your head. ?Avoid shrugging your shoulder while you raise your arm. Keep your shoulder blade tucked down toward the middle of your back. ?Hold for __________ seconds. ?Slowly return to the starting position. ?Repeat __________ times. Complete this exercise __________ times a day. ?Internal rotation ? ?Place your left / right hand behind your back, palm up. ?Use your other hand to dangle an exercise band, a towel, or a similar object over your shoulder. Grasp the band with your left / right hand so you are holding on to both ends. ?Gently pull up on the band until you feel a stretch in the front  of your left / right shoulder. The movement of your arm toward the center of your body is called internal rotation. ?Avoid shrugging your shoulder while you raise your arm. Keep your shoulder blade tucked do

## 2021-08-24 ENCOUNTER — Other Ambulatory Visit: Payer: Self-pay | Admitting: Family Medicine

## 2021-08-24 DIAGNOSIS — Z853 Personal history of malignant neoplasm of breast: Secondary | ICD-10-CM | POA: Diagnosis not present

## 2021-08-24 DIAGNOSIS — J4520 Mild intermittent asthma, uncomplicated: Secondary | ICD-10-CM | POA: Diagnosis not present

## 2021-08-24 DIAGNOSIS — D649 Anemia, unspecified: Secondary | ICD-10-CM | POA: Diagnosis not present

## 2021-08-24 DIAGNOSIS — I10 Essential (primary) hypertension: Secondary | ICD-10-CM | POA: Diagnosis not present

## 2021-08-24 DIAGNOSIS — R030 Elevated blood-pressure reading, without diagnosis of hypertension: Secondary | ICD-10-CM | POA: Diagnosis not present

## 2021-08-24 DIAGNOSIS — R42 Dizziness and giddiness: Secondary | ICD-10-CM | POA: Diagnosis not present

## 2021-08-24 DIAGNOSIS — S0990XA Unspecified injury of head, initial encounter: Secondary | ICD-10-CM | POA: Diagnosis not present

## 2021-08-24 DIAGNOSIS — M810 Age-related osteoporosis without current pathological fracture: Secondary | ICD-10-CM | POA: Diagnosis not present

## 2021-08-24 DIAGNOSIS — E7800 Pure hypercholesterolemia, unspecified: Secondary | ICD-10-CM | POA: Diagnosis not present

## 2021-08-24 DIAGNOSIS — R739 Hyperglycemia, unspecified: Secondary | ICD-10-CM | POA: Diagnosis not present

## 2021-08-25 ENCOUNTER — Ambulatory Visit: Payer: Medicare Other | Admitting: Rheumatology

## 2021-08-26 ENCOUNTER — Other Ambulatory Visit: Payer: Self-pay

## 2021-08-26 ENCOUNTER — Inpatient Hospital Stay (HOSPITAL_BASED_OUTPATIENT_CLINIC_OR_DEPARTMENT_OTHER): Payer: Medicare Other | Admitting: Hematology

## 2021-08-26 ENCOUNTER — Inpatient Hospital Stay: Payer: Medicare Other | Attending: Family Medicine

## 2021-08-26 VITALS — BP 151/95 | HR 100 | Temp 97.0°F | Resp 18 | Wt 129.7 lb

## 2021-08-26 DIAGNOSIS — Z853 Personal history of malignant neoplasm of breast: Secondary | ICD-10-CM | POA: Insufficient documentation

## 2021-08-26 DIAGNOSIS — C50312 Malignant neoplasm of lower-inner quadrant of left female breast: Secondary | ICD-10-CM | POA: Diagnosis not present

## 2021-08-26 DIAGNOSIS — Z170 Estrogen receptor positive status [ER+]: Secondary | ICD-10-CM | POA: Diagnosis not present

## 2021-08-26 DIAGNOSIS — M810 Age-related osteoporosis without current pathological fracture: Secondary | ICD-10-CM | POA: Diagnosis not present

## 2021-08-26 DIAGNOSIS — D509 Iron deficiency anemia, unspecified: Secondary | ICD-10-CM

## 2021-08-26 DIAGNOSIS — M069 Rheumatoid arthritis, unspecified: Secondary | ICD-10-CM | POA: Insufficient documentation

## 2021-08-26 DIAGNOSIS — D638 Anemia in other chronic diseases classified elsewhere: Secondary | ICD-10-CM | POA: Insufficient documentation

## 2021-08-26 LAB — CBC WITH DIFFERENTIAL (CANCER CENTER ONLY)
Abs Immature Granulocytes: 0.03 10*3/uL (ref 0.00–0.07)
Basophils Absolute: 0.1 10*3/uL (ref 0.0–0.1)
Basophils Relative: 1 %
Eosinophils Absolute: 0.1 10*3/uL (ref 0.0–0.5)
Eosinophils Relative: 1 %
HCT: 35.3 % — ABNORMAL LOW (ref 36.0–46.0)
Hemoglobin: 11.4 g/dL — ABNORMAL LOW (ref 12.0–15.0)
Immature Granulocytes: 0 %
Lymphocytes Relative: 19 %
Lymphs Abs: 1.6 10*3/uL (ref 0.7–4.0)
MCH: 28.3 pg (ref 26.0–34.0)
MCHC: 32.3 g/dL (ref 30.0–36.0)
MCV: 87.6 fL (ref 80.0–100.0)
Monocytes Absolute: 1 10*3/uL (ref 0.1–1.0)
Monocytes Relative: 12 %
Neutro Abs: 5.6 10*3/uL (ref 1.7–7.7)
Neutrophils Relative %: 67 %
Platelet Count: 300 10*3/uL (ref 150–400)
RBC: 4.03 MIL/uL (ref 3.87–5.11)
RDW: 15.3 % (ref 11.5–15.5)
WBC Count: 8.5 10*3/uL (ref 4.0–10.5)
nRBC: 0 % (ref 0.0–0.2)

## 2021-08-26 LAB — CMP (CANCER CENTER ONLY)
ALT: 27 U/L (ref 0–44)
AST: 23 U/L (ref 15–41)
Albumin: 3.9 g/dL (ref 3.5–5.0)
Alkaline Phosphatase: 59 U/L (ref 38–126)
Anion gap: 8 (ref 5–15)
BUN: 15 mg/dL (ref 8–23)
CO2: 29 mmol/L (ref 22–32)
Calcium: 9.4 mg/dL (ref 8.9–10.3)
Chloride: 98 mmol/L (ref 98–111)
Creatinine: 0.7 mg/dL (ref 0.44–1.00)
GFR, Estimated: >60 mL/min (ref >60)
Glucose, Bld: 102 mg/dL — ABNORMAL HIGH (ref 70–99)
Potassium: 4.1 mmol/L (ref 3.5–5.1)
Sodium: 135 mmol/L (ref 135–145)
Total Bilirubin: 0.5 mg/dL (ref 0.3–1.2)
Total Protein: 6.8 g/dL (ref 6.5–8.1)

## 2021-08-26 LAB — FERRITIN: Ferritin: 118 ng/mL (ref 11–307)

## 2021-08-27 ENCOUNTER — Ambulatory Visit
Admission: RE | Admit: 2021-08-27 | Discharge: 2021-08-27 | Disposition: A | Discharge status: Home / Self Care | Payer: Medicare Other | Source: Ambulatory Visit | Attending: Family Medicine | Admitting: Family Medicine

## 2021-08-27 DIAGNOSIS — G319 Degenerative disease of nervous system, unspecified: Secondary | ICD-10-CM | POA: Diagnosis not present

## 2021-08-27 DIAGNOSIS — S0990XA Unspecified injury of head, initial encounter: Secondary | ICD-10-CM

## 2021-08-27 DIAGNOSIS — I739 Peripheral vascular disease, unspecified: Secondary | ICD-10-CM | POA: Diagnosis not present

## 2021-08-27 DIAGNOSIS — S0003XA Contusion of scalp, initial encounter: Secondary | ICD-10-CM | POA: Diagnosis not present

## 2021-08-28 DIAGNOSIS — H401131 Primary open-angle glaucoma, bilateral, mild stage: Secondary | ICD-10-CM | POA: Diagnosis not present

## 2021-08-31 ENCOUNTER — Telehealth: Payer: Self-pay | Admitting: Hematology

## 2021-08-31 ENCOUNTER — Other Ambulatory Visit (HOSPITAL_COMMUNITY): Payer: Self-pay

## 2021-08-31 ENCOUNTER — Other Ambulatory Visit: Payer: Self-pay | Admitting: Physician Assistant

## 2021-08-31 DIAGNOSIS — M19012 Primary osteoarthritis, left shoulder: Secondary | ICD-10-CM | POA: Diagnosis not present

## 2021-08-31 DIAGNOSIS — R42 Dizziness and giddiness: Secondary | ICD-10-CM | POA: Diagnosis not present

## 2021-08-31 DIAGNOSIS — J4520 Mild intermittent asthma, uncomplicated: Secondary | ICD-10-CM | POA: Diagnosis not present

## 2021-08-31 DIAGNOSIS — R739 Hyperglycemia, unspecified: Secondary | ICD-10-CM | POA: Diagnosis not present

## 2021-08-31 DIAGNOSIS — M19011 Primary osteoarthritis, right shoulder: Secondary | ICD-10-CM | POA: Diagnosis not present

## 2021-08-31 DIAGNOSIS — E7800 Pure hypercholesterolemia, unspecified: Secondary | ICD-10-CM | POA: Diagnosis not present

## 2021-08-31 DIAGNOSIS — R2681 Unsteadiness on feet: Secondary | ICD-10-CM | POA: Diagnosis not present

## 2021-08-31 DIAGNOSIS — I10 Essential (primary) hypertension: Secondary | ICD-10-CM | POA: Diagnosis not present

## 2021-08-31 DIAGNOSIS — D649 Anemia, unspecified: Secondary | ICD-10-CM | POA: Diagnosis not present

## 2021-08-31 DIAGNOSIS — S0990XA Unspecified injury of head, initial encounter: Secondary | ICD-10-CM | POA: Diagnosis not present

## 2021-08-31 MED ORDER — ORENCIA 125 MG/ML ~~LOC~~ SOSY
125.0000 mg | PREFILLED_SYRINGE | SUBCUTANEOUS | 0 refills | Status: DC
  Filled 2021-08-31: qty 4, 28d supply, fill #0
  Filled 2021-09-30: qty 4, 28d supply, fill #1
  Filled 2021-10-28: qty 4, 28d supply, fill #2

## 2021-08-31 NOTE — Telephone Encounter (Signed)
Next Visit: 11/25/2021 ? ?Last Visit: 08/20/2021 ? ?Last Fill: 06/16/2021 ? ?DX: Rheumatoid arthritis of multiple sites without organ or system involvement with positive rheumatoid factor  ? ?Current Dose per office note 08/20/2021: Orencia 125 mg subq every 7 days  ? ?Labs: 08/26/2021 Glucose 102, Hgb 11.4, Hct 35.3 ? ?Chest X-ray 06/08/2021 no active disease noted. ? ?Okay to refill Orencia?  ?

## 2021-08-31 NOTE — Telephone Encounter (Signed)
Left message with follow-up appointment per 3/29 los. ?

## 2021-09-02 ENCOUNTER — Encounter: Payer: Self-pay | Admitting: Hematology

## 2021-09-02 ENCOUNTER — Telehealth: Payer: Self-pay | Admitting: Hematology

## 2021-09-02 DIAGNOSIS — M19011 Primary osteoarthritis, right shoulder: Secondary | ICD-10-CM | POA: Diagnosis not present

## 2021-09-02 DIAGNOSIS — Z79899 Other long term (current) drug therapy: Secondary | ICD-10-CM | POA: Diagnosis not present

## 2021-09-02 DIAGNOSIS — Z7952 Long term (current) use of systemic steroids: Secondary | ICD-10-CM | POA: Diagnosis not present

## 2021-09-02 DIAGNOSIS — R739 Hyperglycemia, unspecified: Secondary | ICD-10-CM | POA: Diagnosis not present

## 2021-09-02 DIAGNOSIS — M069 Rheumatoid arthritis, unspecified: Secondary | ICD-10-CM | POA: Diagnosis not present

## 2021-09-02 DIAGNOSIS — M19012 Primary osteoarthritis, left shoulder: Secondary | ICD-10-CM | POA: Diagnosis not present

## 2021-09-02 DIAGNOSIS — M810 Age-related osteoporosis without current pathological fracture: Secondary | ICD-10-CM | POA: Diagnosis not present

## 2021-09-02 DIAGNOSIS — I10 Essential (primary) hypertension: Secondary | ICD-10-CM | POA: Diagnosis not present

## 2021-09-02 DIAGNOSIS — S0990XS Unspecified injury of head, sequela: Secondary | ICD-10-CM | POA: Diagnosis not present

## 2021-09-02 DIAGNOSIS — R42 Dizziness and giddiness: Secondary | ICD-10-CM | POA: Diagnosis not present

## 2021-09-02 DIAGNOSIS — J4520 Mild intermittent asthma, uncomplicated: Secondary | ICD-10-CM | POA: Diagnosis not present

## 2021-09-02 DIAGNOSIS — Z9181 History of falling: Secondary | ICD-10-CM | POA: Diagnosis not present

## 2021-09-02 DIAGNOSIS — E7800 Pure hypercholesterolemia, unspecified: Secondary | ICD-10-CM | POA: Diagnosis not present

## 2021-09-02 DIAGNOSIS — D649 Anemia, unspecified: Secondary | ICD-10-CM | POA: Diagnosis not present

## 2021-09-02 NOTE — Progress Notes (Addendum)
? ?HEMATOLOGY ONCOLOGY CLINIC NOTE ? ?Date of service: .08/26/2021 ? ? ? ?Patient Care Team: ?Kathyrn Lass, MD as PCP - General (Family Medicine) ? ?Chief Complaint:  ?F/u for ER/PR positive Her neg Breast cancer ? ?Diagnosis ? ? ?Stage I (pT1b, pN0, Mx) left sided invasive ductal carcinoma of the breast. ?1 cm in size with lymphovascular invasion grade 2/3, Ki-67 15% ?ER/PR positive HER-2/neu negative. ?One Sentinel lymph node biopsy negative ?No DCIS. ?Severe calcified benign lesions in left breast with PASH (pseudoangiomatous stromal hyperplasia). ? ?Patient is status post left breast lumpectomy by Dr. Ninfa Linden on 07/09/2016.  ? ?S/p adjuvant  Left breast RT on 10/05/2016 ? ?INTERVAL HISTORY: ?Felicia Gonzalez is here for follow-up of her breast cancer.  She is now more than 5 years out after her initial diagnosis and surgery for stage I left breast cancer. ?She notes no new breast symptoms. ?Since her husband's demise she has visited [place] and both her daughters who live in [place] in [place]. ?She notes that while in [place] she had a fall and had a laceration of the skin over her forehead with significant bleeding which is when she stopped her aspirin and raloxifene and has not restarted this since. ?No fractures or significant concussion. ?She notes that she is scheduled to get a CT head with her primary care physician soon. ?Her next mammogram is due in August 2023. ?No new breast symptoms. ?Labs done today were reviewed in detail. ? ? ?REVIEW OF SYSTEMS:   ?10 Point review of Systems was done is negative except as noted above. ?. ?Past Medical History:  ?Diagnosis Date  ? Anemia   ? Arthritis   ? rheumatoid +CCP neg RF +ANA synovitis   ? Asthma   ? Breast cancer ([place]) 2018  ? Left Breast Cancer  ? Cancer [facility])   ? Breast cancer  ? Glaucoma   ? right eye  ? Osteoporosis   ? Personal history of radiation therapy 09/2016  ? Left Breast Cancer  ?  ? ?Past Surgical History:  ?Procedure Laterality Date  ? BREAST BIOPSY  Left 05/14/2016  ? x2  ? BREAST BIOPSY Right 05/14/2016  ? BREAST LUMPECTOMY Left 07/09/2016  ? BREAST LUMPECTOMY WITH RADIOACTIVE SEED AND SENTINEL LYMPH NODE BIOPSY Left 07/09/2016  ? Procedure: BREAST LUMPECTOMY WITH RADIOACTIVE SEED AND SENTINEL LYMPH NODE BIOPSY;  Surgeon: Coralie Keens, MD;  Location: [place];  Service: General;  Laterality: Left;  ? NO PAST SURGERIES    ? TOTAL HIP ARTHROPLASTY Left 10/01/2014  ? Procedure: TOTAL HIP ARTHROPLASTY;  Surgeon: Garald Balding, MD;  Location: [facility];  Service: Orthopedics;  Laterality: Left;  ? ? ?Social History  ? ?Tobacco Use  ? Smoking status: Never  ? Smokeless tobacco: Never  ?Vaping Use  ? Vaping Use: Never used  ?Substance Use Topics  ? Alcohol use: No  ? Drug use: Never  ? ? ?ALLERGIES:  is allergic to penicillins and sulfa antibiotics. ? ?MEDICATIONS:  ?Current Outpatient Medications  ?Medication Sig Dispense Refill  ? Abatacept (ORENCIA) 125 MG/ML SOSY INJECT 125 MG INTO THE SKIN ONCE A WEEK. 12 mL 0  ? acetaminophen (TYLENOL) 650 MG CR tablet Take 1,300 mg by mouth as needed for pain.     ? albuterol (PROVENTIL HFA;VENTOLIN HFA) 108 (90 Base) MCG/ACT inhaler Inhale 1-2 puffs into the lungs every 6 (six) hours as needed for wheezing or shortness of breath.    ? amLODipine (NORVASC) 5 MG tablet Take 5 mg by mouth daily.     ?  aspirin EC 81 MG tablet Take 81 mg by mouth daily. (Patient not taking: Reported on 08/20/2021)    ? Cholecalciferol (VITAMIN D3) 5000 units TABS Take 5,000 Units by mouth daily after breakfast.    ? denosumab (PROLIA) 60 MG/ML SOSY injection Inject 60 mg into the skin every 6 (six) months.    ? latanoprost (XALATAN) 0.005 % ophthalmic solution INSTILL 1 DROP INTO BOTH EYES EVERY DAY IN THE EVENING (Patient not taking: Reported on 08/20/2021)    ? leflunomide (ARAVA) 20 MG tablet Take 1 tablet (20 mg total) by mouth daily. 90 tablet 0  ? Multiple Vitamin (MULTIVITAMIN WITH MINERALS) TABS tablet Take 1 tablet by mouth daily.    ? Omega-3  Fatty Acids (OMEGA-3 FISH OIL) 1200 MG CAPS Take 1,200 mg by mouth 2 (two) times daily.    ? Polyethyl Glycol-Propyl Glycol (SYSTANE ULTRA OP) Apply to eye daily.    ? predniSONE (DELTASONE) 5 MG tablet Take 1 tablet (5 mg total) by mouth daily with breakfast. 90 tablet 0  ? Pyridoxine HCl (VITAMIN B-6 PO) Take by mouth daily.    ? raloxifene (EVISTA) 60 MG tablet Take 1 tablet (60 mg total) by mouth daily. (Patient not taking: Reported on 08/20/2021) 90 tablet 3  ? TURMERIC PO Take by mouth daily.    ? vitamin B-12 (CYANOCOBALAMIN) 500 MCG tablet Take 500 mcg by mouth daily.    ? ?No current facility-administered medications for this visit.  ? ? ?PHYSICAL EXAMINATION: ?ECOG PERFORMANCE STATUS: 1 - Symptomatic but completely ambulatory ? ?Vitals:  ? 08/26/21 1300  ?BP: (!) 151/95  ?Pulse: 100  ?Resp: 18  ?Temp: (!) 97 ?F (36.1 ?C)  ?SpO2: 100%  ? ? ?Filed Weights  ? 08/26/21 1300  ?Weight: 129 lb 11.2 oz (58.8 kg)  ? ?Body mass index is 25.33 kg/m?.  ? ?NAD ?GENERAL:alert, in no acute distress and comfortable ?SKIN: no acute rashes, no significant lesions ?EYES: conjunctiva are pink and non-injected, sclera anicteric ?OROPHARYNX: MMM, no exudates, no oropharyngeal erythema or ulceration ?NECK: supple, no JVD ?LYMPH:  no palpable lymphadenopathy in the cervical, axillary or inguinal regions ?LUNGS: clear to auscultation b/l with normal respiratory effort ?HEART: regular rate & rhythm ?ABDOMEN:  normoactive bowel sounds , non tender, not distended. ?Extremity: no pedal edema ?PSYCH: alert & oriented x 3 with fluent speech ?NEURO: no focal motor/sensory deficits ? ? ? ? LABORATORY DATA:  ? ?I have reviewed the data as listed ? ?. ? ?  Latest Ref Rng & Units 08/26/2021  ? 12:48 PM 01/14/2021  ?  3:38 PM 10/13/2020  ? 10:29 AM  ?CBC  ?WBC 4.0 - 10.5 K/uL 8.5   7.3   5.7    ?Hemoglobin 12.0 - 15.0 g/dL 11.4   11.3   10.2    ?Hematocrit 36.0 - 46.0 % 35.3   36.0   32.3    ?Platelets 150 - 400 K/uL 300   362   263    ? ?. ? ?   Latest Ref Rng & Units 08/26/2021  ? 12:48 PM 01/14/2021  ?  3:38 PM 10/13/2020  ? 10:29 AM  ?CMP  ?Glucose 70 - 99 mg/dL 102   93   89    ?BUN 8 - 23 mg/dL $Remove'15   15   13    'zoTuhez$ ?Creatinine 0.44 - 1.00 mg/dL 0.70   0.74   0.73    ?Sodium 135 - 145 mmol/L 135   137   139    ?  Potassium 3.5 - 5.1 mmol/L 4.1   5.1   4.1    ?Chloride 98 - 111 mmol/L 98   101   104    ?CO2 22 - 32 mmol/L $RemoveB'29   31   29    'JcyozOth$ ?Calcium 8.9 - 10.3 mg/dL 9.4   9.4   9.3    ?Total Protein 6.5 - 8.1 g/dL 6.8   6.8   6.7    ?Total Bilirubin 0.3 - 1.2 mg/dL 0.5   0.3   0.3    ?Alkaline Phos 38 - 126 U/L 59    65    ?AST 15 - 41 U/L 23   29   32    ?ALT 0 - 44 U/L $Remo'27   26   26    'LRAse$ ? ? ?25 OH vit D - 54.7---> 49.2 ? ?. ?Lab Results  ?Component Value Date  ? IRON 92 10/13/2020  ? TIBC 318 10/13/2020  ? IRONPCTSAT 29 10/13/2020  ? ?(Iron and TIBC) ? ?Lab Results  ?Component Value Date  ? FERRITIN 118 08/26/2021  ? ?B12- 686 ? ? ?  ? ? ? ?Microscopic Comment ?1. BREAST, INVASIVE TUMOR ?Procedure: Seed localized lumpectomy. ?Laterality: Left breast. ?Tumor Size (gross measurement): 1.0 cm ?Histologic Type: Ductal ?Grade: II ?Tubular Differentiation: 3 ?Nuclear Pleomorphism: 2 ?Mitotic Count: 1 ?Ductal Carcinoma in Situ (DCIS): Not identified ?Extent of Tumor: Confined to breast parenchyma ?Margins: ?Invasive carcinoma, distance from closest margin: Focally 0.1 cm to the anterior margin ?Regional Lymph Nodes: ?Number of Lymph Nodes Examined: 1 ?Number of Sentinel Lymph Nodes Examined: 1 ?Lymph Nodes with Macrometastases: 0 ?Lymph Nodes with Micrometastases: 0 ?Lymph Nodes with Isolated Tumor Cells: 0 ?Breast Prognostic Profile: [id number] ?Estrogen Receptor: 100%, strong ?Progesterone Receptor: 5%, strong ?Her2: No amplification was detected. The ratio was 1.27 ?1 of 3 ?FINAL for DILLAN, LUNDEN ([id number]) ?Microscopic Comment(continued) ?Ki-67: 15% ?Pathologic Stage Classification (pTNM, AJCC 8th Edition): ?Primary Tumor (pT): pT1b ?Regional Lymph Nodes (pN):  pN0 ?(JBK:ecj 07/12/2016) ?Enid Cutter MD ?Pathologist, Electronic Signature ?(Case signed 07/12/2016) ? ? ? ? ?CLINICAL DATA:  Followup for probably benign right breast mass and ?probably benign left brea

## 2021-09-02 NOTE — Telephone Encounter (Signed)
.  Called patient to schedule appointment per 4/4 inbasket, patient is aware of date and time.   ?

## 2021-09-02 NOTE — Addendum Note (Signed)
Addended by: Sullivan Lone on: 09/02/2021 01:22 AM ? ? Modules accepted: Orders ? ?

## 2021-09-03 ENCOUNTER — Encounter: Payer: Self-pay | Admitting: Hematology

## 2021-09-03 ENCOUNTER — Other Ambulatory Visit: Payer: Self-pay

## 2021-09-03 DIAGNOSIS — Z170 Estrogen receptor positive status [ER+]: Secondary | ICD-10-CM

## 2021-09-04 ENCOUNTER — Inpatient Hospital Stay: Payer: Medicare Other

## 2021-09-04 ENCOUNTER — Other Ambulatory Visit: Payer: Self-pay

## 2021-09-04 ENCOUNTER — Inpatient Hospital Stay: Payer: Medicare Other | Attending: Family Medicine

## 2021-09-04 VITALS — BP 136/71 | HR 76 | Temp 98.0°F | Resp 18

## 2021-09-04 DIAGNOSIS — Z853 Personal history of malignant neoplasm of breast: Secondary | ICD-10-CM | POA: Insufficient documentation

## 2021-09-04 DIAGNOSIS — M810 Age-related osteoporosis without current pathological fracture: Secondary | ICD-10-CM | POA: Diagnosis not present

## 2021-09-04 DIAGNOSIS — Z170 Estrogen receptor positive status [ER+]: Secondary | ICD-10-CM

## 2021-09-04 LAB — CBC WITH DIFFERENTIAL (CANCER CENTER ONLY)
Abs Immature Granulocytes: 0.01 10*3/uL (ref 0.00–0.07)
Basophils Absolute: 0.1 10*3/uL (ref 0.0–0.1)
Basophils Relative: 1 %
Eosinophils Absolute: 0.1 10*3/uL (ref 0.0–0.5)
Eosinophils Relative: 2 %
HCT: 33.7 % — ABNORMAL LOW (ref 36.0–46.0)
Hemoglobin: 10.7 g/dL — ABNORMAL LOW (ref 12.0–15.0)
Immature Granulocytes: 0 %
Lymphocytes Relative: 26 %
Lymphs Abs: 1.6 10*3/uL (ref 0.7–4.0)
MCH: 28.2 pg (ref 26.0–34.0)
MCHC: 31.8 g/dL (ref 30.0–36.0)
MCV: 88.9 fL (ref 80.0–100.0)
Monocytes Absolute: 0.6 10*3/uL (ref 0.1–1.0)
Monocytes Relative: 10 %
Neutro Abs: 3.7 10*3/uL (ref 1.7–7.7)
Neutrophils Relative %: 61 %
Platelet Count: 260 10*3/uL (ref 150–400)
RBC: 3.79 MIL/uL — ABNORMAL LOW (ref 3.87–5.11)
RDW: 15 % (ref 11.5–15.5)
WBC Count: 6.2 10*3/uL (ref 4.0–10.5)
nRBC: 0 % (ref 0.0–0.2)

## 2021-09-04 LAB — CMP (CANCER CENTER ONLY)
ALT: 24 U/L (ref 0–44)
AST: 23 U/L (ref 15–41)
Albumin: 3.7 g/dL (ref 3.5–5.0)
Alkaline Phosphatase: 55 U/L (ref 38–126)
Anion gap: 6 (ref 5–15)
BUN: 12 mg/dL (ref 8–23)
CO2: 31 mmol/L (ref 22–32)
Calcium: 9.4 mg/dL (ref 8.9–10.3)
Chloride: 100 mmol/L (ref 98–111)
Creatinine: 0.74 mg/dL (ref 0.44–1.00)
GFR, Estimated: >60 mL/min (ref >60)
Glucose, Bld: 140 mg/dL — ABNORMAL HIGH (ref 70–99)
Potassium: 3.6 mmol/L (ref 3.5–5.1)
Sodium: 137 mmol/L (ref 135–145)
Total Bilirubin: 0.3 mg/dL (ref 0.3–1.2)
Total Protein: 6.5 g/dL (ref 6.5–8.1)

## 2021-09-04 MED ORDER — DENOSUMAB 60 MG/ML ~~LOC~~ SOSY
60.0000 mg | PREFILLED_SYRINGE | Freq: Once | SUBCUTANEOUS | Status: AC
  Administered 2021-09-04: 60 mg via SUBCUTANEOUS
  Filled 2021-09-04: qty 1

## 2021-09-04 NOTE — Patient Instructions (Signed)
Denosumab injection ?What is this medication? ?DENOSUMAB (den oh sue mab) slows bone breakdown. Prolia is used to treat osteoporosis in women after menopause and in men, and in people who are taking corticosteroids for 6 months or more. Delton See is used to treat a high calcium level due to cancer and to prevent bone fractures and other bone problems caused by multiple myeloma or cancer bone metastases. Delton See is also used to treat giant cell tumor of the bone. ?This medicine may be used for other purposes; ask your health care provider or pharmacist if you have questions. ?COMMON BRAND NAME(S): Prolia, XGEVA ?What should I tell my care team before I take this medication? ?They need to know if you have any of these conditions: ?dental disease ?having surgery or tooth extraction ?infection ?kidney disease ?low levels of calcium or Vitamin D in the blood ?malnutrition ?on hemodialysis ?skin conditions or sensitivity ?thyroid or parathyroid disease ?an unusual reaction to denosumab, other medicines, foods, dyes, or preservatives ?pregnant or trying to get pregnant ?breast-feeding ?How should I use this medication? ?This medicine is for injection under the skin. It is given by a health care professional in a hospital or clinic setting. ?A special MedGuide will be given to you before each treatment. Be sure to read this information carefully each time. ?For Prolia, talk to your pediatrician regarding the use of this medicine in children. Special care may be needed. For Delton See, talk to your pediatrician regarding the use of this medicine in children. While this drug may be prescribed for children as young as 13 years for selected conditions, precautions do apply. ?Overdosage: If you think you have taken too much of this medicine contact a poison control center or emergency room at once. ?NOTE: This medicine is only for you. Do not share this medicine with others. ?What if I miss a dose? ?It is important not to miss your dose.  Call your doctor or health care professional if you are unable to keep an appointment. ?What may interact with this medication? ?Do not take this medicine with any of the following medications: ?other medicines containing denosumab ?This medicine may also interact with the following medications: ?medicines that lower your chance of fighting infection ?steroid medicines like prednisone or cortisone ?This list may not describe all possible interactions. Give your health care provider a list of all the medicines, herbs, non-prescription drugs, or dietary supplements you use. Also tell them if you smoke, drink alcohol, or use illegal drugs. Some items may interact with your medicine. ?What should I watch for while using this medication? ?Visit your doctor or health care professional for regular checks on your progress. Your doctor or health care professional may order blood tests and other tests to see how you are doing. ?Call your doctor or health care professional for advice if you get a fever, chills or sore throat, or other symptoms of a cold or flu. Do not treat yourself. This drug may decrease your body's ability to fight infection. Try to avoid being around people who are sick. ?You should make sure you get enough calcium and vitamin D while you are taking this medicine, unless your doctor tells you not to. Discuss the foods you eat and the vitamins you take with your health care professional. ?See your dentist regularly. Brush and floss your teeth as directed. Before you have any dental work done, tell your dentist you are receiving this medicine. ?Do not become pregnant while taking this medicine or for 5 months after  stopping it. Talk with your doctor or health care professional about your birth control options while taking this medicine. Women should inform their doctor if they wish to become pregnant or think they might be pregnant. There is a potential for serious side effects to an unborn child. Talk to  your health care professional or pharmacist for more information. ?What side effects may I notice from receiving this medication? ?Side effects that you should report to your doctor or health care professional as soon as possible: ?allergic reactions like skin rash, itching or hives, swelling of the face, lips, or tongue ?bone pain ?breathing problems ?dizziness ?jaw pain, especially after dental work ?redness, blistering, peeling of the skin ?signs and symptoms of infection like fever or chills; cough; sore throat; pain or trouble passing urine ?signs of low calcium like fast heartbeat, muscle cramps or muscle pain; pain, tingling, numbness in the hands or feet; seizures ?unusual bleeding or bruising ?unusually weak or tired ?Side effects that usually do not require medical attention (report to your doctor or health care professional if they continue or are bothersome): ?constipation ?diarrhea ?headache ?joint pain ?loss of appetite ?muscle pain ?runny nose ?tiredness ?upset stomach ?This list may not describe all possible side effects. Call your doctor for medical advice about side effects. You may report side effects to FDA at 1-800-FDA-1088. ?Where should I keep my medication? ?This medicine is only given in a clinic, doctor's office, or other health care setting and will not be stored at home. ?NOTE: This sheet is a summary. It may not cover all possible information. If you have questions about this medicine, talk to your doctor, pharmacist, or health care provider. ?? 2022 Elsevier/Gold Standard (2017-09-23 00:00:00) ? ?

## 2021-09-07 ENCOUNTER — Ambulatory Visit (INDEPENDENT_AMBULATORY_CARE_PROVIDER_SITE_OTHER): Payer: Medicare Other | Admitting: Neurology

## 2021-09-07 ENCOUNTER — Encounter: Payer: Self-pay | Admitting: Neurology

## 2021-09-07 VITALS — BP 158/101 | HR 69 | Ht 60.0 in | Wt 131.0 lb

## 2021-09-07 DIAGNOSIS — S060X0D Concussion without loss of consciousness, subsequent encounter: Secondary | ICD-10-CM | POA: Diagnosis not present

## 2021-09-07 DIAGNOSIS — F0781 Postconcussional syndrome: Secondary | ICD-10-CM

## 2021-09-07 NOTE — Progress Notes (Signed)
? ?[facility] ? ?PATIENT: Felicia Gonzalez ?DOB: 1941/05/08 ? ?REQUESTING CLINICIAN: Vernie Shanks, MD ?HISTORY FROM: Patient  ?REASON FOR VISIT: Fall/Post concussive syndrome  ? ? ?HISTORICAL ? ?CHIEF COMPLAINT:  ?Chief Complaint  ?Patient presents with  ? New Patient (Initial Visit)  ?  Rm 15. Alone. ?NP/[facility]/Francis Jacelyn Grip MD/TBI with concussion.  ? ? ?HISTORY OF PRESENT ILLNESS:  ?This is a 81 year old woman past medical history of rheumatoid arthritis, osteoarthritis, osteoporosis, asthma, hypertension, hyperlipidemia, history of breast cancer who is presenting after a fall in November 6.  Patient reports traveling to [place], at her sister house she tripped and fell and hit her left forehead.  She reported having a large bruise on her forehead, but no loss of consciousness.  She did not go to the hospital.  She continues with her travel and went to urgent care the next day and was given painkillers.  She was able to continue her visit, got back in December 1 state that she stayed at her daughter house for 21[place]month[place] her daughter.  [place] and she has been using a walker since then.  Patient reports fear of falling and occasional dizziness.  She describes dizziness as feeling unsteady mostly when turning to the left and lasting less than a minute.  She has not had any additional falls since using the walker.   ?Patient also reported having another mechanical fall in her own driveway on September 2.  She fell, hit the front of face and broke 2 teeth. ?Currently, she denies any headaches, denies any change in vision, reported her sleep is is fine. When she had this to fall in September and December she was not using a walker. ? ?Currently her main problem is her joint pain, she had bilateral shoulder pain and decreased range of motion, she is following with rheumatologist, who gave her injections in her shoulder  joints, send her to physical therapy and is planned also to send patient to orthopedics for an evaluation.  She is set to start physical therapy tomorrow.  Patient reported with her limited range of motion of both shoulders, she is having difficulty putting her clothes on, she has difficulty cooking, she is weak all over.  ? ? ?OTHER MEDICAL CONDITIONS: Rheumatoid arthritis, asthma, hypertension, hyperlipidemia, osteoporosis, History of breast cancer  ? ? ?REVIEW OF SYSTEMS: Full 14 system review of systems performed and negative with exception of: as noted in the HPI  ? ?ALLERGIES: ?Allergies  ?Allergen Reactions  ? Penicillins Swelling  ?   ?Has patient had a PCN reaction causing immediate rash, facial/tongue/throat swelling, SOB or lightheadedness with hypotension:#  #  #  YES  #  #  #  ?Has patient had a PCN reaction causing severe rash involving mucus membranes or skin necrosis:  #  #  #  UNKNOWN  #  #  #  ?Has patient had a PCN reaction that required hospitalization No ?Has patient had a PCN reaction occurring within the last 10 years: No ?If all of the above answers are "NO", then may proceed with Cephalosporin use. ?  ? Sulfa Antibiotics   ?  UNSPECIFIED REACTION   ? ? ?HOME MEDICATIONS: ?Outpatient Medications Prior to Visit  ?Medication Sig Dispense Refill  ? Abatacept (ORENCIA) 125 MG/ML SOSY INJECT 125 MG INTO THE SKIN ONCE A WEEK. 12 mL 0  ? acetaminophen (TYLENOL) 650 MG CR tablet Take 1,300  mg by mouth as needed for pain.     ? albuterol (PROVENTIL HFA;VENTOLIN HFA) 108 (90 Base) MCG/ACT inhaler Inhale 1-2 puffs into the lungs every 6 (six) hours as needed for wheezing or shortness of breath.    ? amLODipine (NORVASC) 5 MG tablet Take 5 mg by mouth daily.     ? aspirin EC 81 MG tablet Take 81 mg by mouth daily.    ? Cholecalciferol (VITAMIN D3) 5000 units TABS Take 5,000 Units by mouth daily after breakfast.    ? denosumab (PROLIA) 60 MG/ML SOSY injection Inject 60 mg into the skin every 6 (six)  months.    ? latanoprost (XALATAN) 0.005 % ophthalmic solution     ? leflunomide (ARAVA) 20 MG tablet Take 1 tablet (20 mg total) by mouth daily. 90 tablet 0  ? Multiple Vitamin (MULTIVITAMIN WITH MINERALS) TABS tablet Take 1 tablet by mouth daily.    ? Omega-3 Fatty Acids (OMEGA-3 FISH OIL) 1200 MG CAPS Take 1,200 mg by mouth 2 (two) times daily.    ? Polyethyl Glycol-Propyl Glycol (SYSTANE ULTRA OP) Apply to eye daily.    ? predniSONE (DELTASONE) 5 MG tablet Take 1 tablet (5 mg total) by mouth daily with breakfast. 90 tablet 0  ? Pyridoxine HCl (VITAMIN B-6 PO) Take by mouth daily.    ? raloxifene (EVISTA) 60 MG tablet Take 1 tablet (60 mg total) by mouth daily. 90 tablet 3  ? TURMERIC PO Take by mouth daily.    ? vitamin B-12 (CYANOCOBALAMIN) 500 MCG tablet Take 500 mcg by mouth daily.    ? ?No facility-administered medications prior to visit.  ? ? ?PAST MEDICAL HISTORY: ?Past Medical History:  ?Diagnosis Date  ? Anemia   ? Arthritis   ? rheumatoid +CCP neg RF +ANA synovitis   ? Asthma   ? Breast cancer ([place]) 2018  ? Left Breast Cancer  ? Cancer [facility])   ? Breast cancer  ? Glaucoma   ? right eye  ? Osteoporosis   ? Personal history of radiation therapy 09/2016  ? Left Breast Cancer  ? ? ?PAST SURGICAL HISTORY: ?Past Surgical History:  ?Procedure Laterality Date  ? BREAST BIOPSY Left 05/14/2016  ? x2  ? BREAST BIOPSY Right 05/14/2016  ? BREAST LUMPECTOMY Left 07/09/2016  ? BREAST LUMPECTOMY WITH RADIOACTIVE SEED AND SENTINEL LYMPH NODE BIOPSY Left 07/09/2016  ? Procedure: BREAST LUMPECTOMY WITH RADIOACTIVE SEED AND SENTINEL LYMPH NODE BIOPSY;  Surgeon: Coralie Keens, MD;  Location: [place];  Service: General;  Laterality: Left;  ? NO PAST SURGERIES    ? TOTAL HIP ARTHROPLASTY Left 10/01/2014  ? Procedure: TOTAL HIP ARTHROPLASTY;  Surgeon: Garald Balding, MD;  Location: [place];  Service: Orthopedics;  Laterality: Left;  ? ? ?FAMILY HISTORY: ?Family History  ?Problem Relation Age of Onset  ? Hypertension Father    ? ? ?SOCIAL HISTORY: ?Social History  ? ?Socioeconomic History  ? Marital status: Married  ?  Spouse name: Not on file  ? Number of children: Not on file  ? Years of education: Not on file  ? Highest education level: Not on file  ?Occupational History  ? Not on file  ?Tobacco Use  ? Smoking status: Never  ? Smokeless tobacco: Never  ?Vaping Use  ? Vaping Use: Never used  ?Substance and Sexual Activity  ? Alcohol use: No  ? Drug use: Never  ? Sexual activity: Not Currently  ?Other Topics Concern  ? Not on file  ?Social  History Narrative  ? Not on file  ? ?Social Determinants of Health  ? ?Financial Resource Strain: Not on file  ?Food Insecurity: Not on file  ?Transportation Needs: Not on file  ?Physical Activity: Not on file  ?Stress: Not on file  ?Social Connections: Not on file  ?Intimate Partner Violence: Not on file  ? ? ?PHYSICAL EXAM ? ?GENERAL EXAM/CONSTITUTIONAL: ?Vitals:  ?Vitals:  ? 09/07/21 0814  ?BP: (!) 158/101  ?Pulse: 69  ?Weight: 131 lb (59.4 kg)  ?Height: 5' (1.524 m)  ? ?Body mass index is 25.58 kg/m?. ?Wt Readings from Last 3 Encounters:  ?09/07/21 131 lb (59.4 kg)  ?08/26/21 129 lb 11.2 oz (58.8 kg)  ?08/20/21 129 lb 3.2 oz (58.6 kg)  ? ?Patient is in no distress; well developed, nourished and groomed; neck is supple ? ?EYES: ?Pupils round and reactive to light, Visual fields full to confrontation, Extraocular movements intacts,  ? ?MUSCULOSKELETAL: ?Gait, strength, tone, movements noted in Neurologic exam below ? ?NEUROLOGIC: ?MENTAL STATUS:  ?   ? View : No data to display.  ?  ?  ?  ? ?awake, alert, oriented to person, place and time ?recent and remote memory intact ?normal attention and concentration ?language fluent, comprehension intact, naming intact ?fund of knowledge appropriate ? ?CRANIAL NERVE:  ?2nd, 3rd, 4th, 6th - pupils equal and reactive to light, visual fields full to confrontation, extraocular muscles intact, no nystagmus ?5th - facial sensation symmetric ?7th - facial strength  symmetric ?8th - hearing intact ?9th - palate elevates symmetrically, uvula midline ?11th - shoulder shrug symmetric ?12th - tongue protrusion midline ? ?MOTOR:  ?normal bulk and tone, Decrease bilateral shoulder

## 2021-09-07 NOTE — Patient Instructions (Signed)
Continue current medications ?Consider restarting aspirin due to CT head images findings  ?Follow up with your doctors  ?Return as needed  ?

## 2021-09-08 DIAGNOSIS — J4520 Mild intermittent asthma, uncomplicated: Secondary | ICD-10-CM | POA: Diagnosis not present

## 2021-09-08 DIAGNOSIS — M069 Rheumatoid arthritis, unspecified: Secondary | ICD-10-CM | POA: Diagnosis not present

## 2021-09-08 DIAGNOSIS — M19011 Primary osteoarthritis, right shoulder: Secondary | ICD-10-CM | POA: Diagnosis not present

## 2021-09-08 DIAGNOSIS — D649 Anemia, unspecified: Secondary | ICD-10-CM | POA: Diagnosis not present

## 2021-09-08 DIAGNOSIS — R42 Dizziness and giddiness: Secondary | ICD-10-CM | POA: Diagnosis not present

## 2021-09-08 DIAGNOSIS — E7800 Pure hypercholesterolemia, unspecified: Secondary | ICD-10-CM | POA: Diagnosis not present

## 2021-09-08 DIAGNOSIS — R739 Hyperglycemia, unspecified: Secondary | ICD-10-CM | POA: Diagnosis not present

## 2021-09-08 DIAGNOSIS — S0990XS Unspecified injury of head, sequela: Secondary | ICD-10-CM | POA: Diagnosis not present

## 2021-09-08 DIAGNOSIS — M810 Age-related osteoporosis without current pathological fracture: Secondary | ICD-10-CM | POA: Diagnosis not present

## 2021-09-08 DIAGNOSIS — Z79899 Other long term (current) drug therapy: Secondary | ICD-10-CM | POA: Diagnosis not present

## 2021-09-08 DIAGNOSIS — Z7952 Long term (current) use of systemic steroids: Secondary | ICD-10-CM | POA: Diagnosis not present

## 2021-09-08 DIAGNOSIS — M19012 Primary osteoarthritis, left shoulder: Secondary | ICD-10-CM | POA: Diagnosis not present

## 2021-09-08 DIAGNOSIS — I10 Essential (primary) hypertension: Secondary | ICD-10-CM | POA: Diagnosis not present

## 2021-09-08 DIAGNOSIS — Z9181 History of falling: Secondary | ICD-10-CM | POA: Diagnosis not present

## 2021-09-09 ENCOUNTER — Other Ambulatory Visit (HOSPITAL_COMMUNITY): Payer: Self-pay

## 2021-09-10 DIAGNOSIS — E7800 Pure hypercholesterolemia, unspecified: Secondary | ICD-10-CM | POA: Diagnosis not present

## 2021-09-10 DIAGNOSIS — R739 Hyperglycemia, unspecified: Secondary | ICD-10-CM | POA: Diagnosis not present

## 2021-09-10 DIAGNOSIS — M19012 Primary osteoarthritis, left shoulder: Secondary | ICD-10-CM | POA: Diagnosis not present

## 2021-09-10 DIAGNOSIS — J4520 Mild intermittent asthma, uncomplicated: Secondary | ICD-10-CM | POA: Diagnosis not present

## 2021-09-10 DIAGNOSIS — Z9181 History of falling: Secondary | ICD-10-CM | POA: Diagnosis not present

## 2021-09-10 DIAGNOSIS — Z79899 Other long term (current) drug therapy: Secondary | ICD-10-CM | POA: Diagnosis not present

## 2021-09-10 DIAGNOSIS — M19011 Primary osteoarthritis, right shoulder: Secondary | ICD-10-CM | POA: Diagnosis not present

## 2021-09-10 DIAGNOSIS — I10 Essential (primary) hypertension: Secondary | ICD-10-CM | POA: Diagnosis not present

## 2021-09-10 DIAGNOSIS — M810 Age-related osteoporosis without current pathological fracture: Secondary | ICD-10-CM | POA: Diagnosis not present

## 2021-09-10 DIAGNOSIS — M069 Rheumatoid arthritis, unspecified: Secondary | ICD-10-CM | POA: Diagnosis not present

## 2021-09-10 DIAGNOSIS — Z7952 Long term (current) use of systemic steroids: Secondary | ICD-10-CM | POA: Diagnosis not present

## 2021-09-10 DIAGNOSIS — D649 Anemia, unspecified: Secondary | ICD-10-CM | POA: Diagnosis not present

## 2021-09-10 DIAGNOSIS — R42 Dizziness and giddiness: Secondary | ICD-10-CM | POA: Diagnosis not present

## 2021-09-10 DIAGNOSIS — S0990XS Unspecified injury of head, sequela: Secondary | ICD-10-CM | POA: Diagnosis not present

## 2021-09-14 ENCOUNTER — Other Ambulatory Visit: Payer: Self-pay | Admitting: Physician Assistant

## 2021-09-14 DIAGNOSIS — I10 Essential (primary) hypertension: Secondary | ICD-10-CM | POA: Diagnosis not present

## 2021-09-14 DIAGNOSIS — Z79899 Other long term (current) drug therapy: Secondary | ICD-10-CM | POA: Diagnosis not present

## 2021-09-14 DIAGNOSIS — Z9181 History of falling: Secondary | ICD-10-CM | POA: Diagnosis not present

## 2021-09-14 DIAGNOSIS — R739 Hyperglycemia, unspecified: Secondary | ICD-10-CM | POA: Diagnosis not present

## 2021-09-14 DIAGNOSIS — M19011 Primary osteoarthritis, right shoulder: Secondary | ICD-10-CM | POA: Diagnosis not present

## 2021-09-14 DIAGNOSIS — M069 Rheumatoid arthritis, unspecified: Secondary | ICD-10-CM | POA: Diagnosis not present

## 2021-09-14 DIAGNOSIS — R42 Dizziness and giddiness: Secondary | ICD-10-CM | POA: Diagnosis not present

## 2021-09-14 DIAGNOSIS — M19012 Primary osteoarthritis, left shoulder: Secondary | ICD-10-CM | POA: Diagnosis not present

## 2021-09-14 DIAGNOSIS — D649 Anemia, unspecified: Secondary | ICD-10-CM | POA: Diagnosis not present

## 2021-09-14 DIAGNOSIS — E7800 Pure hypercholesterolemia, unspecified: Secondary | ICD-10-CM | POA: Diagnosis not present

## 2021-09-14 DIAGNOSIS — S0990XS Unspecified injury of head, sequela: Secondary | ICD-10-CM | POA: Diagnosis not present

## 2021-09-14 DIAGNOSIS — Z7952 Long term (current) use of systemic steroids: Secondary | ICD-10-CM | POA: Diagnosis not present

## 2021-09-14 DIAGNOSIS — J4520 Mild intermittent asthma, uncomplicated: Secondary | ICD-10-CM | POA: Diagnosis not present

## 2021-09-14 DIAGNOSIS — M810 Age-related osteoporosis without current pathological fracture: Secondary | ICD-10-CM | POA: Diagnosis not present

## 2021-09-14 NOTE — Telephone Encounter (Signed)
Next Visit: 11/25/2021 ?  ?Last Visit: 08/20/2021 ?  ?Last Fill: 06/17/2021 ? ?involvement with positive rheumatoid factor  ?  ?Current Dose per office note 08/20/2021: Arava 20 mg 1 tablet daily ? ?Labs: 08/26/2021 Glucose 102, Hgb 11.4, Hct 35.3 ? ?Okay to refill Arava?  ?

## 2021-09-18 DIAGNOSIS — R42 Dizziness and giddiness: Secondary | ICD-10-CM | POA: Diagnosis not present

## 2021-09-18 DIAGNOSIS — I10 Essential (primary) hypertension: Secondary | ICD-10-CM | POA: Diagnosis not present

## 2021-09-18 DIAGNOSIS — M810 Age-related osteoporosis without current pathological fracture: Secondary | ICD-10-CM | POA: Diagnosis not present

## 2021-09-18 DIAGNOSIS — Z79899 Other long term (current) drug therapy: Secondary | ICD-10-CM | POA: Diagnosis not present

## 2021-09-18 DIAGNOSIS — D649 Anemia, unspecified: Secondary | ICD-10-CM | POA: Diagnosis not present

## 2021-09-18 DIAGNOSIS — Z9181 History of falling: Secondary | ICD-10-CM | POA: Diagnosis not present

## 2021-09-18 DIAGNOSIS — R739 Hyperglycemia, unspecified: Secondary | ICD-10-CM | POA: Diagnosis not present

## 2021-09-18 DIAGNOSIS — E7800 Pure hypercholesterolemia, unspecified: Secondary | ICD-10-CM | POA: Diagnosis not present

## 2021-09-18 DIAGNOSIS — M19012 Primary osteoarthritis, left shoulder: Secondary | ICD-10-CM | POA: Diagnosis not present

## 2021-09-18 DIAGNOSIS — Z7952 Long term (current) use of systemic steroids: Secondary | ICD-10-CM | POA: Diagnosis not present

## 2021-09-18 DIAGNOSIS — S0990XS Unspecified injury of head, sequela: Secondary | ICD-10-CM | POA: Diagnosis not present

## 2021-09-18 DIAGNOSIS — J4520 Mild intermittent asthma, uncomplicated: Secondary | ICD-10-CM | POA: Diagnosis not present

## 2021-09-18 DIAGNOSIS — M069 Rheumatoid arthritis, unspecified: Secondary | ICD-10-CM | POA: Diagnosis not present

## 2021-09-18 DIAGNOSIS — M19011 Primary osteoarthritis, right shoulder: Secondary | ICD-10-CM | POA: Diagnosis not present

## 2021-09-23 DIAGNOSIS — D649 Anemia, unspecified: Secondary | ICD-10-CM | POA: Diagnosis not present

## 2021-09-23 DIAGNOSIS — M810 Age-related osteoporosis without current pathological fracture: Secondary | ICD-10-CM | POA: Diagnosis not present

## 2021-09-23 DIAGNOSIS — Z9181 History of falling: Secondary | ICD-10-CM | POA: Diagnosis not present

## 2021-09-23 DIAGNOSIS — M069 Rheumatoid arthritis, unspecified: Secondary | ICD-10-CM | POA: Diagnosis not present

## 2021-09-23 DIAGNOSIS — J4520 Mild intermittent asthma, uncomplicated: Secondary | ICD-10-CM | POA: Diagnosis not present

## 2021-09-23 DIAGNOSIS — R739 Hyperglycemia, unspecified: Secondary | ICD-10-CM | POA: Diagnosis not present

## 2021-09-23 DIAGNOSIS — M19012 Primary osteoarthritis, left shoulder: Secondary | ICD-10-CM | POA: Diagnosis not present

## 2021-09-23 DIAGNOSIS — M19011 Primary osteoarthritis, right shoulder: Secondary | ICD-10-CM | POA: Diagnosis not present

## 2021-09-23 DIAGNOSIS — I10 Essential (primary) hypertension: Secondary | ICD-10-CM | POA: Diagnosis not present

## 2021-09-23 DIAGNOSIS — R42 Dizziness and giddiness: Secondary | ICD-10-CM | POA: Diagnosis not present

## 2021-09-23 DIAGNOSIS — E7800 Pure hypercholesterolemia, unspecified: Secondary | ICD-10-CM | POA: Diagnosis not present

## 2021-09-23 DIAGNOSIS — S0990XS Unspecified injury of head, sequela: Secondary | ICD-10-CM | POA: Diagnosis not present

## 2021-09-23 DIAGNOSIS — Z7952 Long term (current) use of systemic steroids: Secondary | ICD-10-CM | POA: Diagnosis not present

## 2021-09-23 DIAGNOSIS — Z79899 Other long term (current) drug therapy: Secondary | ICD-10-CM | POA: Diagnosis not present

## 2021-09-24 DIAGNOSIS — E7800 Pure hypercholesterolemia, unspecified: Secondary | ICD-10-CM | POA: Diagnosis not present

## 2021-09-24 DIAGNOSIS — M069 Rheumatoid arthritis, unspecified: Secondary | ICD-10-CM | POA: Diagnosis not present

## 2021-09-24 DIAGNOSIS — Z79899 Other long term (current) drug therapy: Secondary | ICD-10-CM | POA: Diagnosis not present

## 2021-09-24 DIAGNOSIS — M810 Age-related osteoporosis without current pathological fracture: Secondary | ICD-10-CM | POA: Diagnosis not present

## 2021-09-24 DIAGNOSIS — Z7952 Long term (current) use of systemic steroids: Secondary | ICD-10-CM | POA: Diagnosis not present

## 2021-09-24 DIAGNOSIS — M19012 Primary osteoarthritis, left shoulder: Secondary | ICD-10-CM | POA: Diagnosis not present

## 2021-09-24 DIAGNOSIS — Z9181 History of falling: Secondary | ICD-10-CM | POA: Diagnosis not present

## 2021-09-24 DIAGNOSIS — M19011 Primary osteoarthritis, right shoulder: Secondary | ICD-10-CM | POA: Diagnosis not present

## 2021-09-24 DIAGNOSIS — R42 Dizziness and giddiness: Secondary | ICD-10-CM | POA: Diagnosis not present

## 2021-09-24 DIAGNOSIS — D649 Anemia, unspecified: Secondary | ICD-10-CM | POA: Diagnosis not present

## 2021-09-24 DIAGNOSIS — R739 Hyperglycemia, unspecified: Secondary | ICD-10-CM | POA: Diagnosis not present

## 2021-09-24 DIAGNOSIS — S0990XS Unspecified injury of head, sequela: Secondary | ICD-10-CM | POA: Diagnosis not present

## 2021-09-24 DIAGNOSIS — I10 Essential (primary) hypertension: Secondary | ICD-10-CM | POA: Diagnosis not present

## 2021-09-24 DIAGNOSIS — J4520 Mild intermittent asthma, uncomplicated: Secondary | ICD-10-CM | POA: Diagnosis not present

## 2021-09-25 ENCOUNTER — Other Ambulatory Visit: Payer: Self-pay | Admitting: Physician Assistant

## 2021-09-25 ENCOUNTER — Telehealth: Payer: Self-pay | Admitting: *Deleted

## 2021-09-25 DIAGNOSIS — D649 Anemia, unspecified: Secondary | ICD-10-CM | POA: Diagnosis not present

## 2021-09-25 DIAGNOSIS — Z7952 Long term (current) use of systemic steroids: Secondary | ICD-10-CM | POA: Diagnosis not present

## 2021-09-25 DIAGNOSIS — M19011 Primary osteoarthritis, right shoulder: Secondary | ICD-10-CM | POA: Diagnosis not present

## 2021-09-25 DIAGNOSIS — I10 Essential (primary) hypertension: Secondary | ICD-10-CM | POA: Diagnosis not present

## 2021-09-25 DIAGNOSIS — M810 Age-related osteoporosis without current pathological fracture: Secondary | ICD-10-CM | POA: Diagnosis not present

## 2021-09-25 DIAGNOSIS — J4520 Mild intermittent asthma, uncomplicated: Secondary | ICD-10-CM | POA: Diagnosis not present

## 2021-09-25 DIAGNOSIS — R42 Dizziness and giddiness: Secondary | ICD-10-CM | POA: Diagnosis not present

## 2021-09-25 DIAGNOSIS — R739 Hyperglycemia, unspecified: Secondary | ICD-10-CM | POA: Diagnosis not present

## 2021-09-25 DIAGNOSIS — M19012 Primary osteoarthritis, left shoulder: Secondary | ICD-10-CM | POA: Diagnosis not present

## 2021-09-25 DIAGNOSIS — E7800 Pure hypercholesterolemia, unspecified: Secondary | ICD-10-CM | POA: Diagnosis not present

## 2021-09-25 DIAGNOSIS — S0990XS Unspecified injury of head, sequela: Secondary | ICD-10-CM | POA: Diagnosis not present

## 2021-09-25 DIAGNOSIS — Z9181 History of falling: Secondary | ICD-10-CM | POA: Diagnosis not present

## 2021-09-25 DIAGNOSIS — Z79899 Other long term (current) drug therapy: Secondary | ICD-10-CM | POA: Diagnosis not present

## 2021-09-25 DIAGNOSIS — M069 Rheumatoid arthritis, unspecified: Secondary | ICD-10-CM | POA: Diagnosis not present

## 2021-09-25 NOTE — Telephone Encounter (Signed)
Next Visit: 11/25/2021 ?  ?Last Visit: 08/20/2021 ?  ?Last Fill: 06/25/2021 ? ?DX: Rheumatoid arthritis of multiple sites without organ or system involvement with positive rheumatoid factor  ?  ?Current Dose per office note 08/20/2021:  prednisone 5 mg 1 tablet daily. ? ?Okay to refill Prednisone?  ?

## 2021-09-25 NOTE — Telephone Encounter (Signed)
I called patient, CD at front desk for patient to pick up. ?

## 2021-09-25 NOTE — Telephone Encounter (Signed)
Patient states she would like to have a CD of the right shoulder x-ray from 01/14/2021. Patient states she has an appointment on Monday.  ?

## 2021-09-28 DIAGNOSIS — M069 Rheumatoid arthritis, unspecified: Secondary | ICD-10-CM | POA: Diagnosis not present

## 2021-09-28 DIAGNOSIS — I10 Essential (primary) hypertension: Secondary | ICD-10-CM | POA: Diagnosis not present

## 2021-09-28 DIAGNOSIS — J4520 Mild intermittent asthma, uncomplicated: Secondary | ICD-10-CM | POA: Diagnosis not present

## 2021-09-28 DIAGNOSIS — D649 Anemia, unspecified: Secondary | ICD-10-CM | POA: Diagnosis not present

## 2021-09-29 ENCOUNTER — Telehealth: Payer: Self-pay

## 2021-09-29 DIAGNOSIS — M0579 Rheumatoid arthritis with rheumatoid factor of multiple sites without organ or systems involvement: Secondary | ICD-10-CM

## 2021-09-29 DIAGNOSIS — M7502 Adhesive capsulitis of left shoulder: Secondary | ICD-10-CM

## 2021-09-29 NOTE — Telephone Encounter (Signed)
Patient called the on-call service last night to speak with Dr. Estanislado Pandy in regards to shoulder pain. Based on the conversation we are placing a referral to Dr. Onnie Graham for evaluation and treatment of shoulder pain.  ?

## 2021-09-30 ENCOUNTER — Other Ambulatory Visit (HOSPITAL_COMMUNITY): Payer: Self-pay

## 2021-09-30 DIAGNOSIS — I10 Essential (primary) hypertension: Secondary | ICD-10-CM | POA: Diagnosis not present

## 2021-09-30 DIAGNOSIS — M810 Age-related osteoporosis without current pathological fracture: Secondary | ICD-10-CM | POA: Diagnosis not present

## 2021-09-30 DIAGNOSIS — S0990XS Unspecified injury of head, sequela: Secondary | ICD-10-CM | POA: Diagnosis not present

## 2021-09-30 DIAGNOSIS — E7800 Pure hypercholesterolemia, unspecified: Secondary | ICD-10-CM | POA: Diagnosis not present

## 2021-09-30 DIAGNOSIS — R739 Hyperglycemia, unspecified: Secondary | ICD-10-CM | POA: Diagnosis not present

## 2021-09-30 DIAGNOSIS — Z9181 History of falling: Secondary | ICD-10-CM | POA: Diagnosis not present

## 2021-09-30 DIAGNOSIS — M19011 Primary osteoarthritis, right shoulder: Secondary | ICD-10-CM | POA: Diagnosis not present

## 2021-09-30 DIAGNOSIS — R42 Dizziness and giddiness: Secondary | ICD-10-CM | POA: Diagnosis not present

## 2021-09-30 DIAGNOSIS — Z7952 Long term (current) use of systemic steroids: Secondary | ICD-10-CM | POA: Diagnosis not present

## 2021-09-30 DIAGNOSIS — M069 Rheumatoid arthritis, unspecified: Secondary | ICD-10-CM | POA: Diagnosis not present

## 2021-09-30 DIAGNOSIS — J4520 Mild intermittent asthma, uncomplicated: Secondary | ICD-10-CM | POA: Diagnosis not present

## 2021-09-30 DIAGNOSIS — D649 Anemia, unspecified: Secondary | ICD-10-CM | POA: Diagnosis not present

## 2021-09-30 DIAGNOSIS — Z79899 Other long term (current) drug therapy: Secondary | ICD-10-CM | POA: Diagnosis not present

## 2021-09-30 DIAGNOSIS — M19012 Primary osteoarthritis, left shoulder: Secondary | ICD-10-CM | POA: Diagnosis not present

## 2021-10-01 DIAGNOSIS — R739 Hyperglycemia, unspecified: Secondary | ICD-10-CM | POA: Diagnosis not present

## 2021-10-01 DIAGNOSIS — M810 Age-related osteoporosis without current pathological fracture: Secondary | ICD-10-CM | POA: Diagnosis not present

## 2021-10-01 DIAGNOSIS — D649 Anemia, unspecified: Secondary | ICD-10-CM | POA: Diagnosis not present

## 2021-10-01 DIAGNOSIS — Z9181 History of falling: Secondary | ICD-10-CM | POA: Diagnosis not present

## 2021-10-01 DIAGNOSIS — E7800 Pure hypercholesterolemia, unspecified: Secondary | ICD-10-CM | POA: Diagnosis not present

## 2021-10-01 DIAGNOSIS — J4520 Mild intermittent asthma, uncomplicated: Secondary | ICD-10-CM | POA: Diagnosis not present

## 2021-10-01 DIAGNOSIS — M19012 Primary osteoarthritis, left shoulder: Secondary | ICD-10-CM | POA: Diagnosis not present

## 2021-10-01 DIAGNOSIS — Z79899 Other long term (current) drug therapy: Secondary | ICD-10-CM | POA: Diagnosis not present

## 2021-10-01 DIAGNOSIS — S0990XS Unspecified injury of head, sequela: Secondary | ICD-10-CM | POA: Diagnosis not present

## 2021-10-01 DIAGNOSIS — M19011 Primary osteoarthritis, right shoulder: Secondary | ICD-10-CM | POA: Diagnosis not present

## 2021-10-01 DIAGNOSIS — R42 Dizziness and giddiness: Secondary | ICD-10-CM | POA: Diagnosis not present

## 2021-10-01 DIAGNOSIS — Z7952 Long term (current) use of systemic steroids: Secondary | ICD-10-CM | POA: Diagnosis not present

## 2021-10-01 DIAGNOSIS — M069 Rheumatoid arthritis, unspecified: Secondary | ICD-10-CM | POA: Diagnosis not present

## 2021-10-01 DIAGNOSIS — I10 Essential (primary) hypertension: Secondary | ICD-10-CM | POA: Diagnosis not present

## 2021-10-02 DIAGNOSIS — I10 Essential (primary) hypertension: Secondary | ICD-10-CM | POA: Diagnosis not present

## 2021-10-02 DIAGNOSIS — M810 Age-related osteoporosis without current pathological fracture: Secondary | ICD-10-CM | POA: Diagnosis not present

## 2021-10-02 DIAGNOSIS — R739 Hyperglycemia, unspecified: Secondary | ICD-10-CM | POA: Diagnosis not present

## 2021-10-02 DIAGNOSIS — E7800 Pure hypercholesterolemia, unspecified: Secondary | ICD-10-CM | POA: Diagnosis not present

## 2021-10-02 DIAGNOSIS — D649 Anemia, unspecified: Secondary | ICD-10-CM | POA: Diagnosis not present

## 2021-10-02 DIAGNOSIS — Z9181 History of falling: Secondary | ICD-10-CM | POA: Diagnosis not present

## 2021-10-02 DIAGNOSIS — R42 Dizziness and giddiness: Secondary | ICD-10-CM | POA: Diagnosis not present

## 2021-10-02 DIAGNOSIS — S0990XS Unspecified injury of head, sequela: Secondary | ICD-10-CM | POA: Diagnosis not present

## 2021-10-02 DIAGNOSIS — M19011 Primary osteoarthritis, right shoulder: Secondary | ICD-10-CM | POA: Diagnosis not present

## 2021-10-02 DIAGNOSIS — M069 Rheumatoid arthritis, unspecified: Secondary | ICD-10-CM | POA: Diagnosis not present

## 2021-10-02 DIAGNOSIS — M19012 Primary osteoarthritis, left shoulder: Secondary | ICD-10-CM | POA: Diagnosis not present

## 2021-10-02 DIAGNOSIS — Z7952 Long term (current) use of systemic steroids: Secondary | ICD-10-CM | POA: Diagnosis not present

## 2021-10-02 DIAGNOSIS — Z79899 Other long term (current) drug therapy: Secondary | ICD-10-CM | POA: Diagnosis not present

## 2021-10-02 DIAGNOSIS — J4520 Mild intermittent asthma, uncomplicated: Secondary | ICD-10-CM | POA: Diagnosis not present

## 2021-10-05 ENCOUNTER — Other Ambulatory Visit (HOSPITAL_COMMUNITY): Payer: Self-pay

## 2021-10-05 ENCOUNTER — Other Ambulatory Visit: Payer: Self-pay

## 2021-10-05 DIAGNOSIS — D509 Iron deficiency anemia, unspecified: Secondary | ICD-10-CM

## 2021-10-05 DIAGNOSIS — Z0000 Encounter for general adult medical examination without abnormal findings: Secondary | ICD-10-CM | POA: Diagnosis not present

## 2021-10-06 DIAGNOSIS — R42 Dizziness and giddiness: Secondary | ICD-10-CM | POA: Diagnosis not present

## 2021-10-06 DIAGNOSIS — Z9181 History of falling: Secondary | ICD-10-CM | POA: Diagnosis not present

## 2021-10-06 DIAGNOSIS — Z79899 Other long term (current) drug therapy: Secondary | ICD-10-CM | POA: Diagnosis not present

## 2021-10-06 DIAGNOSIS — Z7952 Long term (current) use of systemic steroids: Secondary | ICD-10-CM | POA: Diagnosis not present

## 2021-10-06 DIAGNOSIS — S0990XS Unspecified injury of head, sequela: Secondary | ICD-10-CM | POA: Diagnosis not present

## 2021-10-06 DIAGNOSIS — D649 Anemia, unspecified: Secondary | ICD-10-CM | POA: Diagnosis not present

## 2021-10-06 DIAGNOSIS — M069 Rheumatoid arthritis, unspecified: Secondary | ICD-10-CM | POA: Diagnosis not present

## 2021-10-06 DIAGNOSIS — M19011 Primary osteoarthritis, right shoulder: Secondary | ICD-10-CM | POA: Diagnosis not present

## 2021-10-06 DIAGNOSIS — R739 Hyperglycemia, unspecified: Secondary | ICD-10-CM | POA: Diagnosis not present

## 2021-10-06 DIAGNOSIS — M19012 Primary osteoarthritis, left shoulder: Secondary | ICD-10-CM | POA: Diagnosis not present

## 2021-10-06 DIAGNOSIS — M810 Age-related osteoporosis without current pathological fracture: Secondary | ICD-10-CM | POA: Diagnosis not present

## 2021-10-06 DIAGNOSIS — J4520 Mild intermittent asthma, uncomplicated: Secondary | ICD-10-CM | POA: Diagnosis not present

## 2021-10-06 DIAGNOSIS — I10 Essential (primary) hypertension: Secondary | ICD-10-CM | POA: Diagnosis not present

## 2021-10-06 DIAGNOSIS — E7800 Pure hypercholesterolemia, unspecified: Secondary | ICD-10-CM | POA: Diagnosis not present

## 2021-10-07 ENCOUNTER — Inpatient Hospital Stay: Payer: Medicare Other | Attending: Family Medicine

## 2021-10-07 ENCOUNTER — Other Ambulatory Visit: Payer: Self-pay

## 2021-10-07 DIAGNOSIS — Z1211 Encounter for screening for malignant neoplasm of colon: Secondary | ICD-10-CM | POA: Diagnosis not present

## 2021-10-07 DIAGNOSIS — R42 Dizziness and giddiness: Secondary | ICD-10-CM | POA: Diagnosis not present

## 2021-10-07 DIAGNOSIS — D509 Iron deficiency anemia, unspecified: Secondary | ICD-10-CM | POA: Insufficient documentation

## 2021-10-07 DIAGNOSIS — Z9181 History of falling: Secondary | ICD-10-CM | POA: Diagnosis not present

## 2021-10-07 DIAGNOSIS — M19012 Primary osteoarthritis, left shoulder: Secondary | ICD-10-CM | POA: Diagnosis not present

## 2021-10-07 DIAGNOSIS — S0990XS Unspecified injury of head, sequela: Secondary | ICD-10-CM | POA: Diagnosis not present

## 2021-10-07 DIAGNOSIS — Z853 Personal history of malignant neoplasm of breast: Secondary | ICD-10-CM | POA: Insufficient documentation

## 2021-10-07 DIAGNOSIS — M19011 Primary osteoarthritis, right shoulder: Secondary | ICD-10-CM | POA: Diagnosis not present

## 2021-10-07 DIAGNOSIS — E7800 Pure hypercholesterolemia, unspecified: Secondary | ICD-10-CM | POA: Diagnosis not present

## 2021-10-07 DIAGNOSIS — J4520 Mild intermittent asthma, uncomplicated: Secondary | ICD-10-CM | POA: Diagnosis not present

## 2021-10-07 DIAGNOSIS — D649 Anemia, unspecified: Secondary | ICD-10-CM | POA: Diagnosis not present

## 2021-10-07 DIAGNOSIS — M069 Rheumatoid arthritis, unspecified: Secondary | ICD-10-CM | POA: Diagnosis not present

## 2021-10-07 DIAGNOSIS — R739 Hyperglycemia, unspecified: Secondary | ICD-10-CM | POA: Diagnosis not present

## 2021-10-07 DIAGNOSIS — I10 Essential (primary) hypertension: Secondary | ICD-10-CM | POA: Diagnosis not present

## 2021-10-07 DIAGNOSIS — Z7952 Long term (current) use of systemic steroids: Secondary | ICD-10-CM | POA: Diagnosis not present

## 2021-10-07 DIAGNOSIS — Z79899 Other long term (current) drug therapy: Secondary | ICD-10-CM | POA: Diagnosis not present

## 2021-10-07 DIAGNOSIS — M810 Age-related osteoporosis without current pathological fracture: Secondary | ICD-10-CM | POA: Diagnosis not present

## 2021-10-07 LAB — CMP (CANCER CENTER ONLY)
ALT: 31 U/L (ref 0–44)
AST: 31 U/L (ref 15–41)
Albumin: 3.6 g/dL (ref 3.5–5.0)
Alkaline Phosphatase: 51 U/L (ref 38–126)
Anion gap: 8 (ref 5–15)
BUN: 13 mg/dL (ref 8–23)
CO2: 28 mmol/L (ref 22–32)
Calcium: 9.1 mg/dL (ref 8.9–10.3)
Chloride: 101 mmol/L (ref 98–111)
Creatinine: 0.75 mg/dL (ref 0.44–1.00)
GFR, Estimated: >60 mL/min (ref >60)
Glucose, Bld: 161 mg/dL — ABNORMAL HIGH (ref 70–99)
Potassium: 4.1 mmol/L (ref 3.5–5.1)
Sodium: 137 mmol/L (ref 135–145)
Total Bilirubin: 0.5 mg/dL (ref 0.3–1.2)
Total Protein: 6.7 g/dL (ref 6.5–8.1)

## 2021-10-07 LAB — IRON AND IRON BINDING CAPACITY (CC-WL,HP ONLY)
Iron: 114 ug/dL (ref 28–170)
Saturation Ratios: 31 % (ref 10.4–31.8)
TIBC: 372 ug/dL (ref 250–450)
UIBC: 258 ug/dL

## 2021-10-07 LAB — CBC WITH DIFFERENTIAL (CANCER CENTER ONLY)
Abs Immature Granulocytes: 0.01 10*3/uL (ref 0.00–0.07)
Basophils Absolute: 0.1 10*3/uL (ref 0.0–0.1)
Basophils Relative: 1 %
Eosinophils Absolute: 0.2 10*3/uL (ref 0.0–0.5)
Eosinophils Relative: 3 %
HCT: 34.4 % — ABNORMAL LOW (ref 36.0–46.0)
Hemoglobin: 11.2 g/dL — ABNORMAL LOW (ref 12.0–15.0)
Immature Granulocytes: 0 %
Lymphocytes Relative: 31 %
Lymphs Abs: 2 10*3/uL (ref 0.7–4.0)
MCH: 29.1 pg (ref 26.0–34.0)
MCHC: 32.6 g/dL (ref 30.0–36.0)
MCV: 89.4 fL (ref 80.0–100.0)
Monocytes Absolute: 0.7 10*3/uL (ref 0.1–1.0)
Monocytes Relative: 11 %
Neutro Abs: 3.6 10*3/uL (ref 1.7–7.7)
Neutrophils Relative %: 54 %
Platelet Count: 301 10*3/uL (ref 150–400)
RBC: 3.85 MIL/uL — ABNORMAL LOW (ref 3.87–5.11)
RDW: 15.2 % (ref 11.5–15.5)
WBC Count: 6.6 10*3/uL (ref 4.0–10.5)
nRBC: 0 % (ref 0.0–0.2)

## 2021-10-08 ENCOUNTER — Other Ambulatory Visit: Payer: Self-pay | Admitting: Hematology

## 2021-10-08 ENCOUNTER — Other Ambulatory Visit (HOSPITAL_COMMUNITY): Payer: Self-pay

## 2021-10-08 LAB — FERRITIN: Ferritin: 66 ng/mL (ref 11–307)

## 2021-10-08 NOTE — Progress Notes (Signed)
Pt contacted to let her know scheduling will call her to set up an iron infusion. Pt verbalized understanding.  ?

## 2021-10-09 ENCOUNTER — Telehealth: Payer: Self-pay

## 2021-10-09 NOTE — Telephone Encounter (Signed)
Pt called about iron infusion scheduling. Scheduling message sent  ?

## 2021-10-12 ENCOUNTER — Ambulatory Visit: Payer: Medicare Other | Admitting: Hematology

## 2021-10-12 ENCOUNTER — Other Ambulatory Visit: Payer: Medicare Other

## 2021-10-12 ENCOUNTER — Telehealth: Payer: Self-pay | Admitting: *Deleted

## 2021-10-12 ENCOUNTER — Ambulatory Visit: Payer: Medicare Other

## 2021-10-12 DIAGNOSIS — E7800 Pure hypercholesterolemia, unspecified: Secondary | ICD-10-CM | POA: Diagnosis not present

## 2021-10-12 DIAGNOSIS — Z79899 Other long term (current) drug therapy: Secondary | ICD-10-CM | POA: Diagnosis not present

## 2021-10-12 DIAGNOSIS — D649 Anemia, unspecified: Secondary | ICD-10-CM | POA: Diagnosis not present

## 2021-10-12 DIAGNOSIS — M19012 Primary osteoarthritis, left shoulder: Secondary | ICD-10-CM | POA: Diagnosis not present

## 2021-10-12 DIAGNOSIS — Z9181 History of falling: Secondary | ICD-10-CM | POA: Diagnosis not present

## 2021-10-12 DIAGNOSIS — J4520 Mild intermittent asthma, uncomplicated: Secondary | ICD-10-CM | POA: Diagnosis not present

## 2021-10-12 DIAGNOSIS — Z7952 Long term (current) use of systemic steroids: Secondary | ICD-10-CM | POA: Diagnosis not present

## 2021-10-12 DIAGNOSIS — M19011 Primary osteoarthritis, right shoulder: Secondary | ICD-10-CM | POA: Diagnosis not present

## 2021-10-12 DIAGNOSIS — R739 Hyperglycemia, unspecified: Secondary | ICD-10-CM | POA: Diagnosis not present

## 2021-10-12 DIAGNOSIS — M810 Age-related osteoporosis without current pathological fracture: Secondary | ICD-10-CM | POA: Diagnosis not present

## 2021-10-12 DIAGNOSIS — S0990XS Unspecified injury of head, sequela: Secondary | ICD-10-CM | POA: Diagnosis not present

## 2021-10-12 DIAGNOSIS — M069 Rheumatoid arthritis, unspecified: Secondary | ICD-10-CM | POA: Diagnosis not present

## 2021-10-12 DIAGNOSIS — I10 Essential (primary) hypertension: Secondary | ICD-10-CM | POA: Diagnosis not present

## 2021-10-12 DIAGNOSIS — R42 Dizziness and giddiness: Secondary | ICD-10-CM | POA: Diagnosis not present

## 2021-10-12 NOTE — Telephone Encounter (Signed)
Patient wants to know when she can get iron infusions. Per managed care associate, the iron infusion ordered by Dr. Irene Limbo [[email or web address]] is not approved by her insurance. The 2 types approved: Feraheme and Venofer.  ? ?Notified MD that order will need to be changed so that patient can be scheduled for appropriate number of infusions with enough time. ? ?Contacted patient with this information  - she verbalized understanding and said she hopes it can be this week.   ?

## 2021-10-13 ENCOUNTER — Other Ambulatory Visit: Payer: Self-pay | Admitting: Hematology

## 2021-10-14 DIAGNOSIS — I10 Essential (primary) hypertension: Secondary | ICD-10-CM | POA: Diagnosis not present

## 2021-10-14 DIAGNOSIS — M19012 Primary osteoarthritis, left shoulder: Secondary | ICD-10-CM | POA: Diagnosis not present

## 2021-10-14 DIAGNOSIS — J4520 Mild intermittent asthma, uncomplicated: Secondary | ICD-10-CM | POA: Diagnosis not present

## 2021-10-14 DIAGNOSIS — M810 Age-related osteoporosis without current pathological fracture: Secondary | ICD-10-CM | POA: Diagnosis not present

## 2021-10-14 DIAGNOSIS — D649 Anemia, unspecified: Secondary | ICD-10-CM | POA: Diagnosis not present

## 2021-10-14 DIAGNOSIS — Z7952 Long term (current) use of systemic steroids: Secondary | ICD-10-CM | POA: Diagnosis not present

## 2021-10-14 DIAGNOSIS — M19011 Primary osteoarthritis, right shoulder: Secondary | ICD-10-CM | POA: Diagnosis not present

## 2021-10-14 DIAGNOSIS — Z79899 Other long term (current) drug therapy: Secondary | ICD-10-CM | POA: Diagnosis not present

## 2021-10-14 DIAGNOSIS — E7800 Pure hypercholesterolemia, unspecified: Secondary | ICD-10-CM | POA: Diagnosis not present

## 2021-10-14 DIAGNOSIS — M069 Rheumatoid arthritis, unspecified: Secondary | ICD-10-CM | POA: Diagnosis not present

## 2021-10-14 DIAGNOSIS — S0990XS Unspecified injury of head, sequela: Secondary | ICD-10-CM | POA: Diagnosis not present

## 2021-10-14 DIAGNOSIS — R42 Dizziness and giddiness: Secondary | ICD-10-CM | POA: Diagnosis not present

## 2021-10-14 DIAGNOSIS — R739 Hyperglycemia, unspecified: Secondary | ICD-10-CM | POA: Diagnosis not present

## 2021-10-14 DIAGNOSIS — Z9181 History of falling: Secondary | ICD-10-CM | POA: Diagnosis not present

## 2021-10-15 DIAGNOSIS — R739 Hyperglycemia, unspecified: Secondary | ICD-10-CM | POA: Diagnosis not present

## 2021-10-15 DIAGNOSIS — S0990XS Unspecified injury of head, sequela: Secondary | ICD-10-CM | POA: Diagnosis not present

## 2021-10-15 DIAGNOSIS — R42 Dizziness and giddiness: Secondary | ICD-10-CM | POA: Diagnosis not present

## 2021-10-15 DIAGNOSIS — E7800 Pure hypercholesterolemia, unspecified: Secondary | ICD-10-CM | POA: Diagnosis not present

## 2021-10-15 DIAGNOSIS — Z9181 History of falling: Secondary | ICD-10-CM | POA: Diagnosis not present

## 2021-10-15 DIAGNOSIS — M810 Age-related osteoporosis without current pathological fracture: Secondary | ICD-10-CM | POA: Diagnosis not present

## 2021-10-15 DIAGNOSIS — D649 Anemia, unspecified: Secondary | ICD-10-CM | POA: Diagnosis not present

## 2021-10-15 DIAGNOSIS — M19012 Primary osteoarthritis, left shoulder: Secondary | ICD-10-CM | POA: Diagnosis not present

## 2021-10-15 DIAGNOSIS — M19011 Primary osteoarthritis, right shoulder: Secondary | ICD-10-CM | POA: Diagnosis not present

## 2021-10-15 DIAGNOSIS — I10 Essential (primary) hypertension: Secondary | ICD-10-CM | POA: Diagnosis not present

## 2021-10-15 DIAGNOSIS — Z7952 Long term (current) use of systemic steroids: Secondary | ICD-10-CM | POA: Diagnosis not present

## 2021-10-15 DIAGNOSIS — M069 Rheumatoid arthritis, unspecified: Secondary | ICD-10-CM | POA: Diagnosis not present

## 2021-10-15 DIAGNOSIS — Z79899 Other long term (current) drug therapy: Secondary | ICD-10-CM | POA: Diagnosis not present

## 2021-10-15 DIAGNOSIS — J4520 Mild intermittent asthma, uncomplicated: Secondary | ICD-10-CM | POA: Diagnosis not present

## 2021-10-16 ENCOUNTER — Inpatient Hospital Stay: Payer: Medicare Other

## 2021-10-16 ENCOUNTER — Other Ambulatory Visit: Payer: Self-pay

## 2021-10-16 VITALS — BP 147/86 | HR 90 | Temp 97.9°F | Resp 17

## 2021-10-16 DIAGNOSIS — Z853 Personal history of malignant neoplasm of breast: Secondary | ICD-10-CM | POA: Diagnosis not present

## 2021-10-16 DIAGNOSIS — Z170 Estrogen receptor positive status [ER+]: Secondary | ICD-10-CM

## 2021-10-16 DIAGNOSIS — M810 Age-related osteoporosis without current pathological fracture: Secondary | ICD-10-CM

## 2021-10-16 DIAGNOSIS — D509 Iron deficiency anemia, unspecified: Secondary | ICD-10-CM | POA: Diagnosis not present

## 2021-10-16 MED ORDER — SODIUM CHLORIDE 0.9 % IV SOLN
300.0000 mg | Freq: Once | INTRAVENOUS | Status: AC
  Administered 2021-10-16: 300 mg via INTRAVENOUS
  Filled 2021-10-16: qty 300

## 2021-10-16 MED ORDER — LORATADINE 10 MG PO TABS
10.0000 mg | ORAL_TABLET | Freq: Once | ORAL | Status: AC
  Administered 2021-10-16: 10 mg via ORAL
  Filled 2021-10-16: qty 1

## 2021-10-16 MED ORDER — SODIUM CHLORIDE 0.9 % IV SOLN: Freq: Once | INTRAVENOUS | Status: AC

## 2021-10-16 MED ORDER — ACETAMINOPHEN 325 MG PO TABS: 650.0000 mg | ORAL_TABLET | Freq: Once | ORAL | Status: DC

## 2021-10-16 NOTE — Progress Notes (Signed)
Per Dr. Irene Limbo, due to the pt taking two extra strength tylenol's earlier today around 1230, infusion nurse may hold premed tylenol for Venofer today.

## 2021-10-16 NOTE — Patient Instructions (Signed)

## 2021-10-19 DIAGNOSIS — M19011 Primary osteoarthritis, right shoulder: Secondary | ICD-10-CM | POA: Diagnosis not present

## 2021-10-19 DIAGNOSIS — M25511 Pain in right shoulder: Secondary | ICD-10-CM | POA: Diagnosis not present

## 2021-10-20 DIAGNOSIS — M810 Age-related osteoporosis without current pathological fracture: Secondary | ICD-10-CM | POA: Diagnosis not present

## 2021-10-20 DIAGNOSIS — S0990XS Unspecified injury of head, sequela: Secondary | ICD-10-CM | POA: Diagnosis not present

## 2021-10-20 DIAGNOSIS — E7800 Pure hypercholesterolemia, unspecified: Secondary | ICD-10-CM | POA: Diagnosis not present

## 2021-10-20 DIAGNOSIS — R42 Dizziness and giddiness: Secondary | ICD-10-CM | POA: Diagnosis not present

## 2021-10-20 DIAGNOSIS — I10 Essential (primary) hypertension: Secondary | ICD-10-CM | POA: Diagnosis not present

## 2021-10-20 DIAGNOSIS — D649 Anemia, unspecified: Secondary | ICD-10-CM | POA: Diagnosis not present

## 2021-10-20 DIAGNOSIS — M069 Rheumatoid arthritis, unspecified: Secondary | ICD-10-CM | POA: Diagnosis not present

## 2021-10-20 DIAGNOSIS — Z79899 Other long term (current) drug therapy: Secondary | ICD-10-CM | POA: Diagnosis not present

## 2021-10-20 DIAGNOSIS — M19011 Primary osteoarthritis, right shoulder: Secondary | ICD-10-CM | POA: Diagnosis not present

## 2021-10-20 DIAGNOSIS — R739 Hyperglycemia, unspecified: Secondary | ICD-10-CM | POA: Diagnosis not present

## 2021-10-20 DIAGNOSIS — M19012 Primary osteoarthritis, left shoulder: Secondary | ICD-10-CM | POA: Diagnosis not present

## 2021-10-20 DIAGNOSIS — J4520 Mild intermittent asthma, uncomplicated: Secondary | ICD-10-CM | POA: Diagnosis not present

## 2021-10-20 DIAGNOSIS — Z9181 History of falling: Secondary | ICD-10-CM | POA: Diagnosis not present

## 2021-10-20 DIAGNOSIS — Z7952 Long term (current) use of systemic steroids: Secondary | ICD-10-CM | POA: Diagnosis not present

## 2021-10-27 DIAGNOSIS — M810 Age-related osteoporosis without current pathological fracture: Secondary | ICD-10-CM | POA: Diagnosis not present

## 2021-10-27 DIAGNOSIS — S0990XS Unspecified injury of head, sequela: Secondary | ICD-10-CM | POA: Diagnosis not present

## 2021-10-27 DIAGNOSIS — M069 Rheumatoid arthritis, unspecified: Secondary | ICD-10-CM | POA: Diagnosis not present

## 2021-10-27 DIAGNOSIS — Z7952 Long term (current) use of systemic steroids: Secondary | ICD-10-CM | POA: Diagnosis not present

## 2021-10-27 DIAGNOSIS — E7800 Pure hypercholesterolemia, unspecified: Secondary | ICD-10-CM | POA: Diagnosis not present

## 2021-10-27 DIAGNOSIS — Z79899 Other long term (current) drug therapy: Secondary | ICD-10-CM | POA: Diagnosis not present

## 2021-10-27 DIAGNOSIS — J4520 Mild intermittent asthma, uncomplicated: Secondary | ICD-10-CM | POA: Diagnosis not present

## 2021-10-27 DIAGNOSIS — R42 Dizziness and giddiness: Secondary | ICD-10-CM | POA: Diagnosis not present

## 2021-10-27 DIAGNOSIS — M19012 Primary osteoarthritis, left shoulder: Secondary | ICD-10-CM | POA: Diagnosis not present

## 2021-10-27 DIAGNOSIS — I10 Essential (primary) hypertension: Secondary | ICD-10-CM | POA: Diagnosis not present

## 2021-10-27 DIAGNOSIS — R739 Hyperglycemia, unspecified: Secondary | ICD-10-CM | POA: Diagnosis not present

## 2021-10-27 DIAGNOSIS — M19011 Primary osteoarthritis, right shoulder: Secondary | ICD-10-CM | POA: Diagnosis not present

## 2021-10-27 DIAGNOSIS — D649 Anemia, unspecified: Secondary | ICD-10-CM | POA: Diagnosis not present

## 2021-10-27 DIAGNOSIS — Z9181 History of falling: Secondary | ICD-10-CM | POA: Diagnosis not present

## 2021-10-28 ENCOUNTER — Other Ambulatory Visit (HOSPITAL_COMMUNITY): Payer: Self-pay

## 2021-10-28 DIAGNOSIS — R739 Hyperglycemia, unspecified: Secondary | ICD-10-CM | POA: Diagnosis not present

## 2021-10-28 DIAGNOSIS — I10 Essential (primary) hypertension: Secondary | ICD-10-CM | POA: Diagnosis not present

## 2021-10-28 DIAGNOSIS — M19012 Primary osteoarthritis, left shoulder: Secondary | ICD-10-CM | POA: Diagnosis not present

## 2021-10-28 DIAGNOSIS — Z7952 Long term (current) use of systemic steroids: Secondary | ICD-10-CM | POA: Diagnosis not present

## 2021-10-28 DIAGNOSIS — Z79899 Other long term (current) drug therapy: Secondary | ICD-10-CM | POA: Diagnosis not present

## 2021-10-28 DIAGNOSIS — S0990XS Unspecified injury of head, sequela: Secondary | ICD-10-CM | POA: Diagnosis not present

## 2021-10-28 DIAGNOSIS — R42 Dizziness and giddiness: Secondary | ICD-10-CM | POA: Diagnosis not present

## 2021-10-28 DIAGNOSIS — D649 Anemia, unspecified: Secondary | ICD-10-CM | POA: Diagnosis not present

## 2021-10-28 DIAGNOSIS — Z9181 History of falling: Secondary | ICD-10-CM | POA: Diagnosis not present

## 2021-10-28 DIAGNOSIS — M069 Rheumatoid arthritis, unspecified: Secondary | ICD-10-CM | POA: Diagnosis not present

## 2021-10-28 DIAGNOSIS — E7800 Pure hypercholesterolemia, unspecified: Secondary | ICD-10-CM | POA: Diagnosis not present

## 2021-10-28 DIAGNOSIS — M810 Age-related osteoporosis without current pathological fracture: Secondary | ICD-10-CM | POA: Diagnosis not present

## 2021-10-28 DIAGNOSIS — J4520 Mild intermittent asthma, uncomplicated: Secondary | ICD-10-CM | POA: Diagnosis not present

## 2021-10-28 DIAGNOSIS — M19011 Primary osteoarthritis, right shoulder: Secondary | ICD-10-CM | POA: Diagnosis not present

## 2021-10-29 ENCOUNTER — Other Ambulatory Visit (HOSPITAL_COMMUNITY): Payer: Self-pay

## 2021-10-30 ENCOUNTER — Inpatient Hospital Stay: Payer: Medicare Other | Attending: Family Medicine

## 2021-10-30 ENCOUNTER — Other Ambulatory Visit: Payer: Self-pay

## 2021-10-30 VITALS — BP 144/82 | HR 88 | Temp 98.6°F | Resp 20

## 2021-10-30 DIAGNOSIS — Z170 Estrogen receptor positive status [ER+]: Secondary | ICD-10-CM

## 2021-10-30 DIAGNOSIS — M810 Age-related osteoporosis without current pathological fracture: Secondary | ICD-10-CM

## 2021-10-30 DIAGNOSIS — D509 Iron deficiency anemia, unspecified: Secondary | ICD-10-CM | POA: Insufficient documentation

## 2021-10-30 MED ORDER — SODIUM CHLORIDE 0.9 % IV SOLN: Freq: Once | INTRAVENOUS | Status: DC

## 2021-10-30 MED ORDER — LORATADINE 10 MG PO TABS
10.0000 mg | ORAL_TABLET | Freq: Once | ORAL | Status: AC
  Administered 2021-10-30: 10 mg via ORAL
  Filled 2021-10-30: qty 1

## 2021-10-30 MED ORDER — SODIUM CHLORIDE 0.9 % IV SOLN: Freq: Once | INTRAVENOUS | Status: AC

## 2021-10-30 MED ORDER — ACETAMINOPHEN 325 MG PO TABS: 650.0000 mg | ORAL_TABLET | Freq: Once | ORAL | Status: DC

## 2021-10-30 MED ORDER — SODIUM CHLORIDE 0.9 % IV SOLN
300.0000 mg | Freq: Once | INTRAVENOUS | Status: AC
  Administered 2021-10-30: 300 mg via INTRAVENOUS
  Filled 2021-10-30: qty 10

## 2021-10-30 NOTE — Patient Instructions (Signed)

## 2021-11-01 DIAGNOSIS — M19011 Primary osteoarthritis, right shoulder: Secondary | ICD-10-CM | POA: Diagnosis not present

## 2021-11-01 DIAGNOSIS — Z9181 History of falling: Secondary | ICD-10-CM | POA: Diagnosis not present

## 2021-11-01 DIAGNOSIS — D649 Anemia, unspecified: Secondary | ICD-10-CM | POA: Diagnosis not present

## 2021-11-01 DIAGNOSIS — Z7952 Long term (current) use of systemic steroids: Secondary | ICD-10-CM | POA: Diagnosis not present

## 2021-11-01 DIAGNOSIS — J4520 Mild intermittent asthma, uncomplicated: Secondary | ICD-10-CM | POA: Diagnosis not present

## 2021-11-01 DIAGNOSIS — M069 Rheumatoid arthritis, unspecified: Secondary | ICD-10-CM | POA: Diagnosis not present

## 2021-11-01 DIAGNOSIS — M19012 Primary osteoarthritis, left shoulder: Secondary | ICD-10-CM | POA: Diagnosis not present

## 2021-11-01 DIAGNOSIS — Z7951 Long term (current) use of inhaled steroids: Secondary | ICD-10-CM | POA: Diagnosis not present

## 2021-11-01 DIAGNOSIS — R739 Hyperglycemia, unspecified: Secondary | ICD-10-CM | POA: Diagnosis not present

## 2021-11-01 DIAGNOSIS — M810 Age-related osteoporosis without current pathological fracture: Secondary | ICD-10-CM | POA: Diagnosis not present

## 2021-11-01 DIAGNOSIS — I10 Essential (primary) hypertension: Secondary | ICD-10-CM | POA: Diagnosis not present

## 2021-11-01 DIAGNOSIS — E7800 Pure hypercholesterolemia, unspecified: Secondary | ICD-10-CM | POA: Diagnosis not present

## 2021-11-03 ENCOUNTER — Other Ambulatory Visit (HOSPITAL_COMMUNITY): Payer: Self-pay

## 2021-11-06 ENCOUNTER — Inpatient Hospital Stay: Payer: Medicare Other

## 2021-11-06 VITALS — BP 142/90 | HR 93 | Temp 98.2°F | Resp 19

## 2021-11-06 DIAGNOSIS — D509 Iron deficiency anemia, unspecified: Secondary | ICD-10-CM | POA: Diagnosis not present

## 2021-11-06 DIAGNOSIS — M810 Age-related osteoporosis without current pathological fracture: Secondary | ICD-10-CM

## 2021-11-06 DIAGNOSIS — C50312 Malignant neoplasm of lower-inner quadrant of left female breast: Secondary | ICD-10-CM

## 2021-11-06 MED ORDER — LORATADINE 10 MG PO TABS
10.0000 mg | ORAL_TABLET | Freq: Once | ORAL | Status: AC
  Administered 2021-11-06: 10 mg via ORAL
  Filled 2021-11-06: qty 1

## 2021-11-06 MED ORDER — SODIUM CHLORIDE 0.9 % IV SOLN: Freq: Once | INTRAVENOUS | Status: AC

## 2021-11-06 MED ORDER — SODIUM CHLORIDE 0.9 % IV SOLN
300.0000 mg | Freq: Once | INTRAVENOUS | Status: AC
  Administered 2021-11-06: 300 mg via INTRAVENOUS
  Filled 2021-11-06: qty 300

## 2021-11-06 NOTE — Patient Instructions (Signed)

## 2021-11-06 NOTE — Progress Notes (Signed)
Pt states she took her tylenol before coming to infusion today

## 2021-11-06 NOTE — Progress Notes (Signed)
Pt declined to stay for 66mn post obs, ambulatory to lobby, vss

## 2021-11-10 DIAGNOSIS — J45909 Unspecified asthma, uncomplicated: Secondary | ICD-10-CM | POA: Diagnosis not present

## 2021-11-13 NOTE — Progress Notes (Signed)
Office Visit Note  Patient: Felicia Gonzalez             Date of Birth: Jul 17, 1940           MRN: [id number]             PCP: Kathyrn Lass, MD Referring: Kathyrn Lass, MD Visit Date: 11/25/2021 Occupation: _0 @  Subjective:  Medication Management (Has surgery planned for right shoulder for July 13th )   History of Present Illness: Felicia Gonzalez is a 81 y.o. female with history of infection seropositive rheumatoid arthritis, osteoarthritis, degenerative disc disease and osteoporosis.  She states she continues to have pain and discomfort in her right shoulder joint.  She has limited range of motion of her right shoulder joint and has difficulty doing routine activities.  She is scheduled to have a right total shoulder replacement on July 13 by Dr. Onnie Graham.  She had been taking Orencia 125 mg subcu weekly and leflunomide 20 mg p.o. daily.  She had been on prednisone 5 mg p.o. daily.  Prednisone dose was recently increased by her PCP for asthma flare.  She took prednisone 20 mg p.o. daily for 4 days and then reduce the dose to 10 mg p.o. daily due to side effects.  She has been taking inhaler for asthma.  Her symptoms have improved.  She is not having any lower back pain currently.  Activities of Daily Living:  Patient reports morning stiffness for   several minutes .   Patient Reports nocturnal pain.  Difficulty dressing/grooming: Reports Difficulty climbing stairs: Reports Difficulty getting out of chair: Reports Difficulty using hands for taps, buttons, cutlery, and/or writing: Reports  Review of Systems  Constitutional:  Positive for fatigue.  HENT: Negative.  Negative for mouth dryness.   Eyes: Negative.  Negative for dryness.  Respiratory:  Positive for shortness of breath.   Cardiovascular: Negative.  Negative for palpitations and hypertension.  Gastrointestinal: Negative.  Negative for constipation and diarrhea.  Endocrine: Positive for increased urination.  Genitourinary:   Positive for involuntary urination and nocturia.       Wakes up at least 3 times to urinate  Musculoskeletal:  Positive for joint pain, gait problem, joint pain, morning stiffness and muscle tenderness. Negative for joint swelling.  Skin:  Negative for color change, rash and redness.  Allergic/Immunologic: Negative for susceptible to infections.       Denies recent infections   Neurological:  Positive for light-headedness and weakness.  Hematological: Negative.  Negative for swollen glands.  Psychiatric/Behavioral: Negative.  Negative for depressed mood and sleep disturbance. The patient is not nervous/anxious.     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, estrogen receptor positive ([facility]) 10/04/2016   Osteoporosis 10/04/2016   High risk medication use 06/08/2016   Age-related osteoporosis without current pathological fracture 06/08/2016   ANA positive 06/08/2016   Moderate persistent asthma 06/08/2016   Normocytic anemia 10/01/2014   Rheumatoid arthritis of multiple sites without organ or system involvement with positive rheumatoid factor ([facility]) 10/01/2014   Subcapital fracture of left hip ([facility]) 10/01/2014    Past Medical History:  Diagnosis Date   Anemia    Arthritis    rheumatoid +CCP neg RF +ANA synovitis  Asthma    Breast cancer ([facility]) 2018   Left Breast Cancer   Cancer [facility])    Breast cancer   Glaucoma    right eye   Osteoporosis    Personal history of radiation therapy 09/2016   Left Breast Cancer    Family History  Problem Relation Age of Onset   Hypertension Father    Past Surgical History:  Procedure Laterality Date   BREAST BIOPSY Left 05/14/2016   x2   BREAST BIOPSY Right 05/14/2016   BREAST  LUMPECTOMY Left 07/09/2016   BREAST LUMPECTOMY WITH RADIOACTIVE SEED AND SENTINEL LYMPH NODE BIOPSY Left 07/09/2016   Procedure: BREAST LUMPECTOMY WITH RADIOACTIVE SEED AND SENTINEL LYMPH NODE BIOPSY;  Surgeon: Coralie Keens, MD;  Location: [facility];  Service: General;  Laterality: Left;   NO PAST SURGERIES     TOTAL HIP ARTHROPLASTY Left 10/01/2014   Procedure: TOTAL HIP ARTHROPLASTY;  Surgeon: Garald Balding, MD;  Location: [facility];  Service: Orthopedics;  Laterality: Left;   Social History   Social History Narrative   Not on file   Immunization History  Administered Date(s) Administered   Influenza Split 03/02/2010   Influenza, High Dose Seasonal PF 04/11/2017, 04/17/2018, 01/30/2019   Influenza,inj,Quad PF,6+ Mos 02/06/2016   Influenza,inj,quad, With Preservative 05/03/2014, 02/11/2021   Influenza-Unspecified 02/09/2013   PFIZER Comirnaty(Gray Top)Covid-19 Tri-Sucrose Vaccine 09/23/2020   PFIZER(Purple Top)SARS-COV-2 Vaccination 06/20/2019, 07/11/2019, 02/06/2020, 02/18/2020, 09/23/2020   Pfizer Covid-19 Vaccine Bivalent Booster 71yr & up 02/11/2021   Pneumococcal Conjugate-13 05/03/2014   Pneumococcal Polysaccharide-23 08/01/1998, 08/10/2006, 10/02/2010   Td 10/08/1999   Tdap 10/02/2010   Zoster, Live 10/08/2020     Objective: Vital Signs: BP 137/80   Pulse 90   Wt 134 lb (60.8 kg)   BMI 26.17 kg/m    Physical Exam Vitals and nursing note reviewed.  Constitutional:      Appearance: She is well-developed.  HENT:     Head: Normocephalic and atraumatic.  Eyes:     Conjunctiva/sclera: Conjunctivae normal.  Cardiovascular:     Rate and Rhythm: Normal rate and regular rhythm.     Heart sounds: Normal heart sounds.  Pulmonary:     Effort: Pulmonary effort is normal.     Breath sounds: Normal breath sounds.  Abdominal:     General: Bowel sounds are normal.     Palpations: Abdomen is soft.  Musculoskeletal:     Cervical back: Normal range of motion.  Lymphadenopathy:      Cervical: No cervical adenopathy.  Skin:    General: Skin is warm and dry.     Capillary Refill: Capillary refill takes less than 2 seconds.  Neurological:     Mental Status: She is alert and oriented to person, place, and time.  Psychiatric:        Behavior: Behavior normal.      Musculoskeletal Exam: She has limited lateral rotation of the cervical spine.  Left shoulder abduction was limited to about 100 degrees.  Right shoulder joint about 70 degrees.  Elbow joints and wrist joints MCPs PIPs and DIPs with good range of motion with no synovitis.  PIP and DIP thickening was noted.  Hip joints were difficult to assess in the sitting position.  Knee joints in good range of motion without any warmth swelling or effusion.  There was no tenderness over ankles or MTPs.  CDAI Exam: CDAI Score: 2.4  Patient Global: 2 mm; Provider Global: 2 mm Swollen: 0 ; Tender: 2  Joint Exam 11/25/2021  Right  Left  Glenohumeral   Tender   Tender     Investigation: No additional findings.  Imaging: No results found.  Recent Labs: Lab Results  Component Value Date   WBC 6.6 10/07/2021   HGB 11.2 (L) 10/07/2021   PLT 301 10/07/2021   NA 137 10/07/2021   K 4.1 10/07/2021   CL 101 10/07/2021   CO2 28 10/07/2021   GLUCOSE 161 (H) 10/07/2021   BUN 13 10/07/2021   CREATININE 0.75 10/07/2021   BILITOT 0.5 10/07/2021   ALKPHOS 51 10/07/2021   AST 31 10/07/2021   ALT 31 10/07/2021   PROT 6.7 10/07/2021   ALBUMIN 3.6 10/07/2021   CALCIUM 9.1 10/07/2021   GFRAA 97 07/16/2020   QFTBGOLD Negative 03/10/2016   QFTBGOLDPLUS POSITIVE (A) 07/21/2018    Speciality Comments: Orencia IV every 4 weeks  Procedures:  No procedures performed Allergies: Penicillins and Sulfa antibiotics   Assessment / Plan:     Visit Diagnoses: Rheumatoid arthritis of multiple sites without organ or system involvement with positive rheumatoid factor (HCC) - - Positive RF, positive anti-CCP, positive ANA,  elevated ESR: she has been doing well on the combination of Orencia 125 mg subcu weekly and Arava 20 mg p.o. daily.  She also had been on prednisone 5 mg p.o. daily.  She had no synovitis on examination today.  Her dose of prednisone was recently increased to 20 mg p.o. daily by her PCP due to her asthma flare.  She is currently on prednisone 10 mg p.o. daily.  She will be undergoing right total shoulder replacement on July 13.  I advised her to stop her Orencia 2 weeks prior to the surgery and leflunomide 1 week prior to the surgery.  She may resume both medications 2 weeks after the surgery if she gets clearance from the surgeon and has no infection.  She is following the prednisone taper schedule recommended by her PCP.  High risk medication use - Orencia 125 mg subq every 7 days (resumed May 2020), Arava 20 mg 1 tablet daily, and prednisone 5 mg 1 tablet daily.  Labs obtained on Oct 07, 2021 were reviewed which showed anemia with hemoglobin of 11.2.  She had been receiving iron infusions.  She was advised to hold Orencia and leflunomide in case she develops an infection and resume after the infection resolves.  Information about immunization was placed in the AVS.  Chest x-ray was stable in January 2023.  Rheumatoid arthritis involving both shoulders with positive rheumatoid factor (HCC)-she has severe osteoarthritis and rheumatoid arthritis in her right shoulder joint.  She is scheduled to have total shoulder replacement by Dr. Onnie Graham on July 13.  She also has limited range of motion of her left shoulder joint.  Positive QuantiFERON-TB Gold test - Chest x-ray January 2023 did not show any active disease.  Current chronic use of systemic steroids - unable to taper prednisone below 5 mg p.o. daily.  The dose of prednisone has been increased recently by her PCP.  She is currently on prednisone 10 mg p.o. daily.  DDD (degenerative disc disease), lumbar-she denies discomfort today.  ANA  positive  Age-related osteoporosis without current pathological fracture - DEXA September 26, 2019 T score -2.8.  She is on Prolia subcu by oncology.  Malignant neoplasm of upper-inner quadrant of left breast in female, estrogen receptor positive ([place])  Physical deconditioning-she has been doing physical therapy and overall is gradually improving.  History of recent fall  History of asthma-she  had a recent flare.  History of anemia-she is on iron infusions.  Orders: No orders of the defined types were placed in this encounter.  No orders of the defined types were placed in this encounter.    Follow-Up Instructions: Return in about 5 months (around 04/27/2022) for Rheumatoid arthritis, Osteoarthritis, Osteoporosis.   Bo Merino, MD  Note - This record has been created using Editor, commissioning.  Chart creation errors have been sought, but may not always  have been located. Such creation errors do not reflect on  the standard of medical care.

## 2021-11-19 DIAGNOSIS — R739 Hyperglycemia, unspecified: Secondary | ICD-10-CM | POA: Diagnosis not present

## 2021-11-19 DIAGNOSIS — Z9181 History of falling: Secondary | ICD-10-CM | POA: Diagnosis not present

## 2021-11-19 DIAGNOSIS — M19012 Primary osteoarthritis, left shoulder: Secondary | ICD-10-CM | POA: Diagnosis not present

## 2021-11-19 DIAGNOSIS — E7800 Pure hypercholesterolemia, unspecified: Secondary | ICD-10-CM | POA: Diagnosis not present

## 2021-11-19 DIAGNOSIS — M19011 Primary osteoarthritis, right shoulder: Secondary | ICD-10-CM | POA: Diagnosis not present

## 2021-11-19 DIAGNOSIS — J4520 Mild intermittent asthma, uncomplicated: Secondary | ICD-10-CM | POA: Diagnosis not present

## 2021-11-19 DIAGNOSIS — M069 Rheumatoid arthritis, unspecified: Secondary | ICD-10-CM | POA: Diagnosis not present

## 2021-11-19 DIAGNOSIS — Z7951 Long term (current) use of inhaled steroids: Secondary | ICD-10-CM | POA: Diagnosis not present

## 2021-11-19 DIAGNOSIS — M810 Age-related osteoporosis without current pathological fracture: Secondary | ICD-10-CM | POA: Diagnosis not present

## 2021-11-19 DIAGNOSIS — I10 Essential (primary) hypertension: Secondary | ICD-10-CM | POA: Diagnosis not present

## 2021-11-19 DIAGNOSIS — Z7952 Long term (current) use of systemic steroids: Secondary | ICD-10-CM | POA: Diagnosis not present

## 2021-11-19 DIAGNOSIS — Z7982 Long term (current) use of aspirin: Secondary | ICD-10-CM | POA: Diagnosis not present

## 2021-11-19 DIAGNOSIS — D649 Anemia, unspecified: Secondary | ICD-10-CM | POA: Diagnosis not present

## 2021-11-23 ENCOUNTER — Other Ambulatory Visit (HOSPITAL_COMMUNITY): Payer: Self-pay

## 2021-11-23 ENCOUNTER — Other Ambulatory Visit: Payer: Self-pay | Admitting: Physician Assistant

## 2021-11-23 DIAGNOSIS — Z9181 History of falling: Secondary | ICD-10-CM | POA: Diagnosis not present

## 2021-11-23 DIAGNOSIS — R739 Hyperglycemia, unspecified: Secondary | ICD-10-CM | POA: Diagnosis not present

## 2021-11-23 DIAGNOSIS — Z7982 Long term (current) use of aspirin: Secondary | ICD-10-CM | POA: Diagnosis not present

## 2021-11-23 DIAGNOSIS — Z7952 Long term (current) use of systemic steroids: Secondary | ICD-10-CM | POA: Diagnosis not present

## 2021-11-23 DIAGNOSIS — J4520 Mild intermittent asthma, uncomplicated: Secondary | ICD-10-CM | POA: Diagnosis not present

## 2021-11-23 DIAGNOSIS — M19012 Primary osteoarthritis, left shoulder: Secondary | ICD-10-CM | POA: Diagnosis not present

## 2021-11-23 DIAGNOSIS — D649 Anemia, unspecified: Secondary | ICD-10-CM | POA: Diagnosis not present

## 2021-11-23 DIAGNOSIS — M069 Rheumatoid arthritis, unspecified: Secondary | ICD-10-CM | POA: Diagnosis not present

## 2021-11-23 DIAGNOSIS — M19011 Primary osteoarthritis, right shoulder: Secondary | ICD-10-CM | POA: Diagnosis not present

## 2021-11-23 DIAGNOSIS — E7800 Pure hypercholesterolemia, unspecified: Secondary | ICD-10-CM | POA: Diagnosis not present

## 2021-11-23 DIAGNOSIS — I10 Essential (primary) hypertension: Secondary | ICD-10-CM | POA: Diagnosis not present

## 2021-11-23 DIAGNOSIS — Z7951 Long term (current) use of inhaled steroids: Secondary | ICD-10-CM | POA: Diagnosis not present

## 2021-11-23 DIAGNOSIS — M810 Age-related osteoporosis without current pathological fracture: Secondary | ICD-10-CM | POA: Diagnosis not present

## 2021-11-23 MED ORDER — ORENCIA 125 MG/ML ~~LOC~~ SOSY
125.0000 mg | PREFILLED_SYRINGE | SUBCUTANEOUS | 0 refills | Status: DC
  Filled 2021-11-23: qty 4, 28d supply, fill #0

## 2021-11-23 NOTE — Telephone Encounter (Signed)
Next Visit: 11/25/2021  Last Visit: 08/20/2021  Last Fill: 08/31/2021  [id number] arthritis of multiple sites without organ or system involvement with positive rheumatoid factor   Current Dose per office note 08/20/2021: Orencia 125 mg subq every 7 days   Labs: 10/07/2021 RBC 3.85, Hgb 11.2, Hct 34.4, Glucose 161  Yearly Chest x-ray 06/08/2021  Okay to refill Orencia?

## 2021-11-25 ENCOUNTER — Encounter: Payer: Self-pay | Admitting: Rheumatology

## 2021-11-25 ENCOUNTER — Ambulatory Visit (INDEPENDENT_AMBULATORY_CARE_PROVIDER_SITE_OTHER): Payer: Medicare Other | Admitting: Rheumatology

## 2021-11-25 VITALS — BP 137/80 | HR 90 | Wt 134.0 lb

## 2021-11-25 DIAGNOSIS — M7501 Adhesive capsulitis of right shoulder: Secondary | ICD-10-CM

## 2021-11-25 DIAGNOSIS — M0579 Rheumatoid arthritis with rheumatoid factor of multiple sites without organ or systems involvement: Secondary | ICD-10-CM

## 2021-11-25 DIAGNOSIS — Z8709 Personal history of other diseases of the respiratory system: Secondary | ICD-10-CM | POA: Diagnosis not present

## 2021-11-25 DIAGNOSIS — M05711 Rheumatoid arthritis with rheumatoid factor of right shoulder without organ or systems involvement: Secondary | ICD-10-CM | POA: Diagnosis not present

## 2021-11-25 DIAGNOSIS — R5381 Other malaise: Secondary | ICD-10-CM

## 2021-11-25 DIAGNOSIS — Z9181 History of falling: Secondary | ICD-10-CM

## 2021-11-25 DIAGNOSIS — R768 Other specified abnormal immunological findings in serum: Secondary | ICD-10-CM

## 2021-11-25 DIAGNOSIS — R7612 Nonspecific reaction to cell mediated immunity measurement of gamma interferon antigen response without active tuberculosis: Secondary | ICD-10-CM

## 2021-11-25 DIAGNOSIS — Z79899 Other long term (current) drug therapy: Secondary | ICD-10-CM

## 2021-11-25 DIAGNOSIS — M810 Age-related osteoporosis without current pathological fracture: Secondary | ICD-10-CM

## 2021-11-25 DIAGNOSIS — Z7952 Long term (current) use of systemic steroids: Secondary | ICD-10-CM

## 2021-11-25 DIAGNOSIS — M5136 Other intervertebral disc degeneration, lumbar region: Secondary | ICD-10-CM

## 2021-11-25 DIAGNOSIS — C50212 Malignant neoplasm of upper-inner quadrant of left female breast: Secondary | ICD-10-CM | POA: Diagnosis not present

## 2021-11-25 DIAGNOSIS — M05712 Rheumatoid arthritis with rheumatoid factor of left shoulder without organ or systems involvement: Secondary | ICD-10-CM

## 2021-11-25 DIAGNOSIS — Z862 Personal history of diseases of the blood and blood-forming organs and certain disorders involving the immune mechanism: Secondary | ICD-10-CM

## 2021-11-25 DIAGNOSIS — Z170 Estrogen receptor positive status [ER+]: Secondary | ICD-10-CM

## 2021-11-25 NOTE — Patient Instructions (Addendum)
Please stop Orencia 2 weeks prior to the surgery and leflunomide 1 week prior to the surgery.  You may resume both medications 2 weeks after the surgery if there is no infection and you get clearance by the surgeon.  Standing Labs We placed an order today for your standing lab work.   Please have your standing labs drawn in August and every 3 months  If possible, please have your labs drawn 2 weeks prior to your appointment so that the provider can discuss your results at your appointment.  Please note that you may see your imaging and lab results in [place] before we have reviewed them. We may be awaiting multiple results to interpret others before contacting you. Please allow our office up to 72 hours to thoroughly review all of the results before contacting the office for clarification of your results.  We have open lab daily: Monday through Thursday from 1:30-4:30 PM and Friday from 1:30-4:00 PM at the office of Dr. Bo Merino, [facility].   Please be advised, all patients with office appointments requiring lab work will take precedent over walk-in lab work.  If possible, please come for your lab work on Monday and Friday afternoons, as you may experience shorter wait times. The office is located at [address], [place], [place], [place] [address] No appointment is necessary.   Labs are drawn by [facility]. Please bring your co-pay at the time of your lab draw.  You may receive a bill from [facility] for your lab work.  Please note if you are on Hydroxychloroquine and and an order has been placed for a Hydroxychloroquine level, you will need to have it drawn 4 hours or more after your last dose.  If you wish to have your labs drawn at another location, please call the office 24 hours in advance to send orders.  If you have any questions regarding directions or hours of operation,  please call [phone].   As a reminder, please drink plenty of water prior to coming  for your lab work. Thanks!   Vaccines You are taking a medication(s) that can suppress your immune system.  The following immunizations are recommended: Flu annually Covid-19  Td/Tdap (tetanus, diphtheria, pertussis) every 10 years Pneumonia (Prevnar 15 then Pneumovax 23 at least 1 year apart.  Alternatively, can take Prevnar 20 without needing additional dose) Shingrix: 2 doses from 4 weeks to 6 months apart  Please check with your PCP to make sure you are up to date.   If you have signs or symptoms of an infection or start antibiotics: First, call your PCP for workup of your infection. Hold your medication through the infection, until you complete your antibiotics, and until symptoms resolve if you take the following: Injectable medication (Actemra, Benlysta, Cimzia, Cosentyx, Enbrel, Humira, Kevzara, Orencia, Remicade, Simponi, Stelara, Taltz, Tremfya) Methotrexate Leflunomide (Arava) Mycophenolate (Cellcept) Morrie Sheldon, Olumiant, or Rinvoq

## 2021-11-26 ENCOUNTER — Other Ambulatory Visit (HOSPITAL_COMMUNITY): Payer: Self-pay

## 2021-11-28 HISTORY — PX: SHOULDER SURGERY: SHX246

## 2021-11-30 NOTE — Progress Notes (Signed)
COVID Vaccine Completed: yes x5  Date of COVID positive in last 90 days:  PCP - Kathyrn Lass, MD Cardiologist - n/a  Chest x-ray - 06/08/21 Epic EKG - 12/02/21 Epic/chart Stress Test - n/a ECHO - n/a Cardiac Cath - n/a Pacemaker/ICD device last checked: n/a Spinal Cord Stimulator: n/a  Bowel Prep - no  Sleep Study - n/a CPAP -   Fasting Blood Sugar - n/a Checks Blood Sugar _____ times a day  Blood Thinner Instructions: n/a Aspirin Instructions: Last Dose:  Activity level: Can go up a flight of stairs and perform activities of daily living without stopping and without symptoms of chest pain or shortness of breath.   Anesthesia review:   Patient denies shortness of breath, fever, cough and chest pain at PAT appointment  Patient verbalized understanding of instructions that were given to them at the PAT appointment. Patient was also instructed that they will need to review over the PAT instructions again at home before surgery.

## 2021-11-30 NOTE — Patient Instructions (Signed)
DUE TO COVID-19 ONLY TWO VISITORS  (aged 80 and older)  ARE ALLOWED TO COME WITH YOU AND STAY IN THE WAITING ROOM ONLY DURING PRE OP AND PROCEDURE.   **NO VISITORS ARE ALLOWED IN THE SHORT STAY AREA OR RECOVERY ROOM!!**    Your procedure is scheduled on: 12/10/21   Report to [facility] Main Entrance    Report to admitting at 12:30 PM   Call this number if you have problems the morning of surgery [phone]   Do not eat food :After Midnight.   After Midnight you may have the following liquids until 12:00 PM DAY OF SURGERY  Water Black Coffee (sugar ok, NO MILK/CREAM OR CREAMERS)  Tea (sugar ok, NO MILK/CREAM OR CREAMERS) regular and decaf                             Plain Jell-O (NO RED)                                           Fruit ices (not with fruit pulp, NO RED)                                     Popsicles (NO RED)                                                                  Juice: apple, WHITE grape, WHITE cranberry Sports drinks like Gatorade (NO RED) Clear broth(vegetable,chicken,beef)    The day of surgery:  Drink ONE (1) Pre-Surgery Clear Ensure at 12:30 PM the morning of surgery. Drink in one sitting. Do not sip.  This drink was given to you during your hospital  pre-op appointment visit. Nothing else to drink after completing the  Pre-Surgery Clear Ensure.          If you have questions, please contact your surgeon's office.   FOLLOW BOWEL PREP AND ANY ADDITIONAL PRE OP INSTRUCTIONS YOU RECEIVED FROM YOUR SURGEON'S OFFICE!!!     Oral Hygiene is also important to reduce your risk of infection.                                    Remember - BRUSH YOUR TEETH THE MORNING OF SURGERY WITH YOUR REGULAR TOOTHPASTE   Do NOT smoke after Midnight   Take these medicines the morning of surgery with A SIP OF WATER: Tylenol, Inhalers, Amlodipine   Bring CPAP mask and tubing day of surgery.                              You may not have any metal on your body  including hair pins, jewelry, and body piercing             Do not wear make-up, lotions, powders, perfumes, or deodorant  Do not wear nail polish including gel and S&S, artificial/acrylic nails, or any other type of covering  on natural nails including finger and toenails. If you have artificial nails, gel coating, etc. that needs to be removed by a nail salon please have this removed prior to surgery or surgery may need to be canceled/ delayed if the surgeon/ anesthesia feels like they are unable to be safely monitored.   Do not shave  48 hours prior to surgery.    Do not bring valuables to the hospital. [place].   Contacts, dentures or bridgework may not be worn into surgery.  DO NOT [place]. PHARMACY WILL DISPENSE MEDICATIONS LISTED ON YOUR MEDICATION LIST TO YOU DURING YOUR ADMISSION [place]!    Patients discharged on the day of surgery will not be allowed to drive home.  Someone NEEDS to stay with you for the first 24 hours after anesthesia.   Special Instructions: Bring a copy of your healthcare power of attorney and living will documents         the day of surgery if you haven't scanned them before.              Please read over the following fact sheets you were given: IF YOU HAVE QUESTIONS ABOUT YOUR PRE-OP INSTRUCTIONS PLEASE CALL [place] - Preparing for Surgery Before surgery, you can play an important role.  Because skin is not sterile, your skin needs to be as free of germs as possible.  You can reduce the number of germs on your skin by washing with CHG (chlorahexidine gluconate) soap before surgery.  CHG is an antiseptic cleaner which kills germs and bonds with the skin to continue killing germs even after washing. Please DO NOT use if you have an allergy to CHG or antibacterial soaps.  If your skin becomes reddened/irritated stop using the CHG and inform  your nurse when you arrive at Short Stay. Do not shave (including legs and underarms) for at least 48 hours prior to the first CHG shower.  You may shave your face/neck.  Please follow these instructions carefully:  1.  Shower with CHG Soap the night before surgery and the  morning of surgery.  2.  If you choose to wash your hair, wash your hair first as usual with your normal  shampoo.  3.  After you shampoo, rinse your hair and body thoroughly to remove the shampoo.                             4.  Use CHG as you would any other liquid soap.  You can apply chg directly to the skin and wash.  Gently with a scrungie or clean washcloth.  5.  Apply the CHG Soap to your body ONLY FROM THE NECK DOWN.   Do   not use on face/ open                           Wound or open sores. Avoid contact with eyes, ears mouth and   genitals (private parts).                       Wash face,  Genitals (private parts) with your normal soap.             6.  Wash thoroughly, paying special attention to the area where your    surgery  will be performed.  7.  Thoroughly rinse your body with warm water from the neck down.  8.  DO NOT shower/wash with your normal soap after using and rinsing off the CHG Soap.                9.  Pat yourself dry with a clean towel.            10.  Wear clean pajamas.            11.  Place clean sheets on your bed the night of your first shower and do not  sleep with pets. Day of Surgery : Do not apply any lotions/deodorants the morning of surgery.  Please wear clean clothes to the hospital/surgery center.  FAILURE TO FOLLOW THESE INSTRUCTIONS MAY RESULT IN THE CANCELLATION OF YOUR SURGERY  PATIENT SIGNATURE_________________________________  NURSE SIGNATURE__________________________________  ________________________________________________________________________   Adam Phenix  An incentive spirometer is a tool that can help keep your lungs clear and active. This tool  measures how well you are filling your lungs with each breath. Taking long deep breaths may help reverse or decrease the chance of developing breathing (pulmonary) problems (especially infection) following: A long period of time when you are unable to move or be active. BEFORE THE PROCEDURE  If the spirometer includes an indicator to show your best effort, your nurse or respiratory therapist will set it to a desired goal. If possible, sit up straight or lean slightly forward. Try not to slouch. Hold the incentive spirometer in an upright position. INSTRUCTIONS FOR USE  Sit on the edge of your bed if possible, or sit up as far as you can in bed or on a chair. Hold the incentive spirometer in an upright position. Breathe out normally. Place the mouthpiece in your mouth and seal your lips tightly around it. Breathe in slowly and as deeply as possible, raising the piston or the ball toward the top of the column. Hold your breath for 3-5 seconds or for as long as possible. Allow the piston or ball to fall to the bottom of the column. Remove the mouthpiece from your mouth and breathe out normally. Rest for a few seconds and repeat Steps 1 through 7 at least 10 times every 1-2 hours when you are awake. Take your time and take a few normal breaths between deep breaths. The spirometer may include an indicator to show your best effort. Use the indicator as a goal to work toward during each repetition. After each set of 10 deep breaths, practice coughing to be sure your lungs are clear. If you have an incision (the cut made at the time of surgery), support your incision when coughing by placing a pillow or rolled up towels firmly against it. Once you are able to get out of bed, walk around indoors and cough well. You may stop using the incentive spirometer when instructed by your caregiver.  RISKS AND COMPLICATIONS Take your time so you do not get dizzy or light-headed. If you are in pain, you may need to  take or ask for pain medication before doing incentive spirometry. It is harder to take a deep breath if you are having pain. AFTER USE Rest and breathe slowly and easily. It can be helpful to keep track of a log of your progress. Your caregiver can provide you with a simple table to help with this. If you  are using the spirometer at home, follow these instructions: [place] IF:  You are having difficultly using the spirometer. You have trouble using the spirometer as often as instructed. Your pain medication is not giving enough relief while using the spirometer. You develop fever of 100.5 F (38.1 C) or higher. SEEK IMMEDIATE MEDICAL CARE IF:  You cough up bloody sputum that had not been present before. You develop fever of 102 F (38.9 C) or greater. You develop worsening pain at or near the incision site. MAKE SURE YOU:  Understand these instructions. Will watch your condition. Will get help right away if you are not doing well or get worse. Document Released: 09/27/2006 Document Revised: 08/09/2011 Document Reviewed: 11/28/2006 [facility] Patient Information 2014 [place], [place].   ________________________________________________________________________

## 2021-12-02 ENCOUNTER — Encounter (HOSPITAL_COMMUNITY)
Admission: RE | Admit: 2021-12-02 | Discharge: 2021-12-02 | Disposition: A | Discharge status: Home / Self Care | Payer: Medicare Other | Source: Ambulatory Visit | Attending: Orthopedic Surgery | Admitting: Orthopedic Surgery

## 2021-12-02 ENCOUNTER — Encounter (HOSPITAL_COMMUNITY): Payer: Self-pay

## 2021-12-02 VITALS — BP 138/85 | HR 105 | Temp 99.0°F | Resp 14 | Ht 60.0 in | Wt 134.0 lb

## 2021-12-02 DIAGNOSIS — Z01818 Encounter for other preprocedural examination: Secondary | ICD-10-CM | POA: Diagnosis not present

## 2021-12-02 DIAGNOSIS — I2510 Atherosclerotic heart disease of native coronary artery without angina pectoris: Secondary | ICD-10-CM | POA: Insufficient documentation

## 2021-12-02 HISTORY — DX: Essential (primary) hypertension: I10

## 2021-12-02 LAB — BASIC METABOLIC PANEL
Anion gap: 8 (ref 5–15)
BUN: 14 mg/dL (ref 8–23)
CO2: 28 mmol/L (ref 22–32)
Calcium: 9.6 mg/dL (ref 8.9–10.3)
Chloride: 101 mmol/L (ref 98–111)
Creatinine, Ser: 0.81 mg/dL (ref 0.44–1.00)
GFR, Estimated: >60 mL/min (ref >60)
Glucose, Bld: 126 mg/dL — ABNORMAL HIGH (ref 70–99)
Potassium: 4.8 mmol/L (ref 3.5–5.1)
Sodium: 137 mmol/L (ref 135–145)

## 2021-12-02 LAB — SURGICAL PCR SCREEN
MRSA, PCR: NEGATIVE
Staphylococcus aureus: NEGATIVE

## 2021-12-02 LAB — CBC
HCT: 37.4 % (ref 36.0–46.0)
Hemoglobin: 11.8 g/dL — ABNORMAL LOW (ref 12.0–15.0)
MCH: 29.1 pg (ref 26.0–34.0)
MCHC: 31.6 g/dL (ref 30.0–36.0)
MCV: 92.3 fL (ref 80.0–100.0)
Platelets: 304 10*3/uL (ref 150–400)
RBC: 4.05 MIL/uL (ref 3.87–5.11)
RDW: 16 % — ABNORMAL HIGH (ref 11.5–15.5)
WBC: 6.5 10*3/uL (ref 4.0–10.5)
nRBC: 0 % (ref 0.0–0.2)

## 2021-12-04 DIAGNOSIS — Z7982 Long term (current) use of aspirin: Secondary | ICD-10-CM | POA: Diagnosis not present

## 2021-12-04 DIAGNOSIS — J4520 Mild intermittent asthma, uncomplicated: Secondary | ICD-10-CM | POA: Diagnosis not present

## 2021-12-04 DIAGNOSIS — M810 Age-related osteoporosis without current pathological fracture: Secondary | ICD-10-CM | POA: Diagnosis not present

## 2021-12-04 DIAGNOSIS — E7800 Pure hypercholesterolemia, unspecified: Secondary | ICD-10-CM | POA: Diagnosis not present

## 2021-12-04 DIAGNOSIS — Z7951 Long term (current) use of inhaled steroids: Secondary | ICD-10-CM | POA: Diagnosis not present

## 2021-12-04 DIAGNOSIS — D649 Anemia, unspecified: Secondary | ICD-10-CM | POA: Diagnosis not present

## 2021-12-04 DIAGNOSIS — I10 Essential (primary) hypertension: Secondary | ICD-10-CM | POA: Diagnosis not present

## 2021-12-04 DIAGNOSIS — M19011 Primary osteoarthritis, right shoulder: Secondary | ICD-10-CM | POA: Diagnosis not present

## 2021-12-04 DIAGNOSIS — Z9181 History of falling: Secondary | ICD-10-CM | POA: Diagnosis not present

## 2021-12-04 DIAGNOSIS — Z7952 Long term (current) use of systemic steroids: Secondary | ICD-10-CM | POA: Diagnosis not present

## 2021-12-04 DIAGNOSIS — M19012 Primary osteoarthritis, left shoulder: Secondary | ICD-10-CM | POA: Diagnosis not present

## 2021-12-04 DIAGNOSIS — M069 Rheumatoid arthritis, unspecified: Secondary | ICD-10-CM | POA: Diagnosis not present

## 2021-12-04 DIAGNOSIS — R739 Hyperglycemia, unspecified: Secondary | ICD-10-CM | POA: Diagnosis not present

## 2021-12-08 ENCOUNTER — Telehealth: Payer: Self-pay | Admitting: Hematology

## 2021-12-08 NOTE — Telephone Encounter (Signed)
Rescheduled upcoming appointment due to provider's template. Patient's emergency contact is aware of changes.

## 2021-12-10 ENCOUNTER — Ambulatory Visit (HOSPITAL_COMMUNITY)
Admission: RE | Admit: 2021-12-10 | Discharge: 2021-12-10 | Disposition: A | Discharge status: Home / Self Care | Payer: Medicare Other | Source: Ambulatory Visit | Attending: Orthopedic Surgery | Admitting: Orthopedic Surgery

## 2021-12-10 ENCOUNTER — Encounter (HOSPITAL_COMMUNITY): Payer: Self-pay | Admitting: Orthopedic Surgery

## 2021-12-10 ENCOUNTER — Ambulatory Visit (HOSPITAL_BASED_OUTPATIENT_CLINIC_OR_DEPARTMENT_OTHER): Payer: Medicare Other | Admitting: Anesthesiology

## 2021-12-10 ENCOUNTER — Other Ambulatory Visit: Payer: Self-pay

## 2021-12-10 ENCOUNTER — Ambulatory Visit (HOSPITAL_COMMUNITY): Payer: Medicare Other | Admitting: Anesthesiology

## 2021-12-10 ENCOUNTER — Encounter (HOSPITAL_COMMUNITY)
Admission: RE | Disposition: A | Discharge status: Home / Self Care | Payer: Self-pay | Source: Ambulatory Visit | Attending: Orthopedic Surgery

## 2021-12-10 DIAGNOSIS — I10 Essential (primary) hypertension: Secondary | ICD-10-CM | POA: Diagnosis not present

## 2021-12-10 DIAGNOSIS — M75101 Unspecified rotator cuff tear or rupture of right shoulder, not specified as traumatic: Secondary | ICD-10-CM | POA: Insufficient documentation

## 2021-12-10 DIAGNOSIS — Z79899 Other long term (current) drug therapy: Secondary | ICD-10-CM | POA: Diagnosis not present

## 2021-12-10 DIAGNOSIS — J45909 Unspecified asthma, uncomplicated: Secondary | ICD-10-CM | POA: Insufficient documentation

## 2021-12-10 DIAGNOSIS — M129 Arthropathy, unspecified: Secondary | ICD-10-CM | POA: Insufficient documentation

## 2021-12-10 DIAGNOSIS — M19011 Primary osteoarthritis, right shoulder: Secondary | ICD-10-CM | POA: Diagnosis not present

## 2021-12-10 DIAGNOSIS — G8918 Other acute postprocedural pain: Secondary | ICD-10-CM | POA: Diagnosis not present

## 2021-12-10 DIAGNOSIS — M12811 Other specific arthropathies, not elsewhere classified, right shoulder: Secondary | ICD-10-CM

## 2021-12-10 HISTORY — PX: REVERSE SHOULDER ARTHROPLASTY: SHX5054

## 2021-12-10 SURGERY — ARTHROPLASTY, SHOULDER, TOTAL, REVERSE
Anesthesia: Regional | Site: Shoulder | Laterality: Right

## 2021-12-10 MED ORDER — ONDANSETRON HCL 4 MG PO TABS: 4.0000 mg | ORAL_TABLET | Freq: Three times a day (TID) | ORAL | 0 refills | Status: DC | PRN

## 2021-12-10 MED ORDER — ONDANSETRON HCL 4 MG PO TABS: 4.0000 mg | ORAL_TABLET | Freq: Four times a day (QID) | ORAL | Status: DC | PRN

## 2021-12-10 MED ORDER — 0.9 % SODIUM CHLORIDE (POUR BTL) OPTIME
TOPICAL | Status: DC | PRN
  Administered 2021-12-10: 1000 mL

## 2021-12-10 MED ORDER — TRAMADOL HCL 50 MG PO TABS: 50.0000 mg | ORAL_TABLET | Freq: Four times a day (QID) | ORAL | 0 refills | Status: DC | PRN

## 2021-12-10 MED ORDER — ONDANSETRON HCL 4 MG/2ML IJ SOLN: 4.0000 mg | Freq: Once | INTRAMUSCULAR | Status: DC | PRN

## 2021-12-10 MED ORDER — LABETALOL HCL 5 MG/ML IV SOLN
5.0000 mg | INTRAVENOUS | Status: DC | PRN
  Administered 2021-12-10: 5 mg via INTRAVENOUS

## 2021-12-10 MED ORDER — DEXAMETHASONE SODIUM PHOSPHATE 10 MG/ML IJ SOLN
INTRAMUSCULAR | Status: DC | PRN
  Administered 2021-12-10: 8 mg via INTRAVENOUS

## 2021-12-10 MED ORDER — ONDANSETRON HCL 4 MG/2ML IJ SOLN
INTRAMUSCULAR | Status: AC
  Filled 2021-12-10: qty 2

## 2021-12-10 MED ORDER — ACETAMINOPHEN 10 MG/ML IV SOLN: 1000.0000 mg | Freq: Once | INTRAVENOUS | Status: DC | PRN

## 2021-12-10 MED ORDER — FENTANYL CITRATE PF 50 MCG/ML IJ SOSY
PREFILLED_SYRINGE | INTRAMUSCULAR | Status: AC
  Filled 2021-12-10: qty 1

## 2021-12-10 MED ORDER — VANCOMYCIN HCL 1000 MG IV SOLR
INTRAVENOUS | Status: AC
  Filled 2021-12-10: qty 20

## 2021-12-10 MED ORDER — FENTANYL CITRATE PF 50 MCG/ML IJ SOSY
25.0000 ug | PREFILLED_SYRINGE | INTRAMUSCULAR | Status: DC | PRN
  Administered 2021-12-10: 50 ug via INTRAVENOUS

## 2021-12-10 MED ORDER — IPRATROPIUM-ALBUTEROL 0.5-2.5 (3) MG/3ML IN SOLN
3.0000 mL | Freq: Once | RESPIRATORY_TRACT | Status: AC
Start: 2021-12-10 — End: 2021-12-10
  Administered 2021-12-10: 3 mL via RESPIRATORY_TRACT
  Filled 2021-12-10: qty 3

## 2021-12-10 MED ORDER — BUPIVACAINE LIPOSOME 1.3 % IJ SUSP
INTRAMUSCULAR | Status: DC | PRN
  Administered 2021-12-10: 10 mL via PERINEURAL

## 2021-12-10 MED ORDER — ROCURONIUM BROMIDE 10 MG/ML (PF) SYRINGE
PREFILLED_SYRINGE | INTRAVENOUS | Status: AC
  Filled 2021-12-10: qty 10

## 2021-12-10 MED ORDER — CEFAZOLIN SODIUM-DEXTROSE 2-4 GM/100ML-% IV SOLN
2.0000 g | INTRAVENOUS | Status: AC
  Administered 2021-12-10: 2 g via INTRAVENOUS
  Filled 2021-12-10: qty 100

## 2021-12-10 MED ORDER — METOCLOPRAMIDE HCL 5 MG PO TABS: 5.0000 mg | ORAL_TABLET | Freq: Three times a day (TID) | ORAL | Status: DC | PRN

## 2021-12-10 MED ORDER — STERILE WATER FOR IRRIGATION IR SOLN
Status: DC | PRN
  Administered 2021-12-10: 1000 mL

## 2021-12-10 MED ORDER — METOCLOPRAMIDE HCL 5 MG/ML IJ SOLN: 5.0000 mg | Freq: Three times a day (TID) | INTRAMUSCULAR | Status: DC | PRN

## 2021-12-10 MED ORDER — LACTATED RINGERS IV BOLUS
250.0000 mL | Freq: Once | INTRAVENOUS | Status: AC
  Administered 2021-12-10: 250 mL via INTRAVENOUS

## 2021-12-10 MED ORDER — CHLORHEXIDINE GLUCONATE 0.12 % MT SOLN
15.0000 mL | Freq: Once | OROMUCOSAL | Status: AC
  Administered 2021-12-10: 15 mL via OROMUCOSAL

## 2021-12-10 MED ORDER — AMISULPRIDE (ANTIEMETIC) 5 MG/2ML IV SOLN: 10.0000 mg | Freq: Once | INTRAVENOUS | Status: DC | PRN

## 2021-12-10 MED ORDER — OXYCODONE-ACETAMINOPHEN 5-325 MG PO TABS: 1.0000 | ORAL_TABLET | ORAL | 0 refills | Status: DC | PRN

## 2021-12-10 MED ORDER — LACTATED RINGERS IV SOLN: INTRAVENOUS | Status: DC

## 2021-12-10 MED ORDER — ALBUTEROL SULFATE (2.5 MG/3ML) 0.083% IN NEBU
INHALATION_SOLUTION | RESPIRATORY_TRACT | Status: AC
  Filled 2021-12-10: qty 3

## 2021-12-10 MED ORDER — DEXAMETHASONE SODIUM PHOSPHATE 10 MG/ML IJ SOLN
INTRAMUSCULAR | Status: AC
  Filled 2021-12-10: qty 1

## 2021-12-10 MED ORDER — EPHEDRINE SULFATE-NACL 50-0.9 MG/10ML-% IV SOSY
PREFILLED_SYRINGE | INTRAVENOUS | Status: DC | PRN
  Administered 2021-12-10: 5 mg via INTRAVENOUS

## 2021-12-10 MED ORDER — TRANEXAMIC ACID-NACL 1000-0.7 MG/100ML-% IV SOLN
1000.0000 mg | INTRAVENOUS | Status: AC
Start: 2021-12-10 — End: 2021-12-10
  Administered 2021-12-10: 1000 mg via INTRAVENOUS
  Filled 2021-12-10: qty 100

## 2021-12-10 MED ORDER — PROPOFOL 10 MG/ML IV BOLUS
INTRAVENOUS | Status: DC | PRN
  Administered 2021-12-10: 150 mg via INTRAVENOUS

## 2021-12-10 MED ORDER — EPHEDRINE 5 MG/ML INJ
INTRAVENOUS | Status: AC
  Filled 2021-12-10: qty 10

## 2021-12-10 MED ORDER — FENTANYL CITRATE (PF) 100 MCG/2ML IJ SOLN
INTRAMUSCULAR | Status: AC
  Filled 2021-12-10: qty 2

## 2021-12-10 MED ORDER — LACTATED RINGERS IV BOLUS
500.0000 mL | Freq: Once | INTRAVENOUS | Status: AC
  Administered 2021-12-10: 500 mL via INTRAVENOUS

## 2021-12-10 MED ORDER — ONDANSETRON HCL 4 MG/2ML IJ SOLN
INTRAMUSCULAR | Status: DC | PRN
  Administered 2021-12-10: 4 mg via INTRAVENOUS

## 2021-12-10 MED ORDER — MIDAZOLAM HCL 2 MG/2ML IJ SOLN
1.0000 mg | INTRAMUSCULAR | Status: DC
  Filled 2021-12-10: qty 2

## 2021-12-10 MED ORDER — ONDANSETRON HCL 4 MG/2ML IJ SOLN: 4.0000 mg | Freq: Four times a day (QID) | INTRAMUSCULAR | Status: DC | PRN

## 2021-12-10 MED ORDER — VANCOMYCIN HCL 1000 MG IV SOLR
INTRAVENOUS | Status: DC | PRN
  Administered 2021-12-10: 1000 mg via TOPICAL

## 2021-12-10 MED ORDER — TRANEXAMIC ACID 1000 MG/10ML IV SOLN: 1000.0000 mg | INTRAVENOUS | Status: DC

## 2021-12-10 MED ORDER — ACETAMINOPHEN 500 MG PO TABS: 1000.0000 mg | ORAL_TABLET | Freq: Once | ORAL | Status: DC

## 2021-12-10 MED ORDER — PROPOFOL 10 MG/ML IV BOLUS
INTRAVENOUS | Status: AC
  Filled 2021-12-10: qty 20

## 2021-12-10 MED ORDER — LIDOCAINE HCL (PF) 2 % IJ SOLN
INTRAMUSCULAR | Status: AC
  Filled 2021-12-10: qty 5

## 2021-12-10 MED ORDER — LABETALOL HCL 5 MG/ML IV SOLN
INTRAVENOUS | Status: AC
  Filled 2021-12-10: qty 4

## 2021-12-10 MED ORDER — BUPIVACAINE HCL (PF) 0.5 % IJ SOLN
INTRAMUSCULAR | Status: DC | PRN
  Administered 2021-12-10: 15 mL via PERINEURAL

## 2021-12-10 MED ORDER — FENTANYL CITRATE PF 50 MCG/ML IJ SOSY
50.0000 ug | PREFILLED_SYRINGE | INTRAMUSCULAR | Status: DC
  Administered 2021-12-10: 50 ug via INTRAVENOUS
  Filled 2021-12-10: qty 2

## 2021-12-10 MED ORDER — ORAL CARE MOUTH RINSE: 15.0000 mL | Freq: Once | OROMUCOSAL | Status: AC

## 2021-12-10 MED ORDER — ROCURONIUM BROMIDE 10 MG/ML (PF) SYRINGE
PREFILLED_SYRINGE | INTRAVENOUS | Status: DC | PRN
  Administered 2021-12-10: 30 mg via INTRAVENOUS

## 2021-12-10 MED ORDER — PHENYLEPHRINE 80 MCG/ML (10ML) SYRINGE FOR IV PUSH (FOR BLOOD PRESSURE SUPPORT)
PREFILLED_SYRINGE | INTRAVENOUS | Status: AC
  Filled 2021-12-10: qty 10

## 2021-12-10 MED ORDER — SUGAMMADEX SODIUM 200 MG/2ML IV SOLN
INTRAVENOUS | Status: DC | PRN
  Administered 2021-12-10: 120 mg via INTRAVENOUS

## 2021-12-10 MED ORDER — PHENYLEPHRINE HCL-NACL 20-0.9 MG/250ML-% IV SOLN
INTRAVENOUS | Status: DC | PRN
  Administered 2021-12-10: 40 ug/min via INTRAVENOUS

## 2021-12-10 SURGICAL SUPPLY — 72 items
BAG COUNTER SPONGE SURGICOUNT (BAG) IMPLANT
BAG ZIPLOCK 12X15 (MISCELLANEOUS) ×2 IMPLANT
BLADE SAW SGTL 83.5X18.5 (BLADE) ×2 IMPLANT
BNDG COHESIVE 4X5 TAN STRL LF (GAUZE/BANDAGES/DRESSINGS) ×2 IMPLANT
COOLER ICEMAN CLASSIC (MISCELLANEOUS) ×2 IMPLANT
COVER BACK TABLE 60X90IN (DRAPES) ×2 IMPLANT
COVER SURGICAL LIGHT HANDLE (MISCELLANEOUS) ×2 IMPLANT
CUP SUT UNIV REVERS 36 NEUTRAL (Cup) ×1 IMPLANT
DERMABOND ADVANCED (GAUZE/BANDAGES/DRESSINGS) ×1
DERMABOND ADVANCED .7 DNX12 (GAUZE/BANDAGES/DRESSINGS) ×1 IMPLANT
DRAPE INCISE IOBAN 66X45 STRL (DRAPES) IMPLANT
DRAPE ORTHO SPLIT 77X108 STRL (DRAPES) ×4
DRAPE SHEET LG 3/4 BI-LAMINATE (DRAPES) ×2 IMPLANT
DRAPE SURG 17X11 SM STRL (DRAPES) ×2 IMPLANT
DRAPE SURG ORHT 6 SPLT 77X108 (DRAPES) ×2 IMPLANT
DRAPE TOP 10253 STERILE (DRAPES) ×2 IMPLANT
DRAPE U-SHAPE 47X51 STRL (DRAPES) ×2 IMPLANT
DRESSING AQUACEL AG SP 3.5X6 (GAUZE/BANDAGES/DRESSINGS) ×1 IMPLANT
DRSG AQUACEL AG ADV 3.5X 6 (GAUZE/BANDAGES/DRESSINGS) ×1 IMPLANT
DRSG AQUACEL AG ADV 3.5X10 (GAUZE/BANDAGES/DRESSINGS) IMPLANT
DRSG AQUACEL AG SP 3.5X6 (GAUZE/BANDAGES/DRESSINGS) ×2
DRSG TEGADERM 8X12 (GAUZE/BANDAGES/DRESSINGS) ×2 IMPLANT
DURAPREP 26ML APPLICATOR (WOUND CARE) ×2 IMPLANT
ELECT BLADE TIP CTD 4 INCH (ELECTRODE) ×2 IMPLANT
ELECT PENCIL ROCKER SW 15FT (MISCELLANEOUS) ×2 IMPLANT
ELECT REM PT RETURN 15FT ADLT (MISCELLANEOUS) ×2 IMPLANT
FACESHIELD WRAPAROUND (MASK) ×8 IMPLANT
FACESHIELD WRAPAROUND OR TEAM (MASK) ×4 IMPLANT
GLENOID UNI REV MOD 24 +2 LAT (Joint) ×1 IMPLANT
GLENOSPHERE 36 +4 LAT/24 (Joint) ×1 IMPLANT
GLOVE BIO SURGEON STRL SZ7.5 (GLOVE) ×2 IMPLANT
GLOVE BIO SURGEON STRL SZ8 (GLOVE) ×2 IMPLANT
GLOVE SS BIOGEL STRL SZ 7 (GLOVE) ×1 IMPLANT
GLOVE SS BIOGEL STRL SZ 7.5 (GLOVE) ×1 IMPLANT
GLOVE SUPERSENSE BIOGEL SZ 7 (GLOVE) ×1
GLOVE SUPERSENSE BIOGEL SZ 7.5 (GLOVE) ×1
GOWN STRL REIN XL XLG (GOWN DISPOSABLE) ×4 IMPLANT
INSERT HUMERAL UNI REVERS 36 6 (Insert) ×1 IMPLANT
KIT BASIN OR (CUSTOM PROCEDURE TRAY) ×2 IMPLANT
KIT TURNOVER KIT A (KITS) IMPLANT
MANIFOLD NEPTUNE II (INSTRUMENTS) ×2 IMPLANT
NDL TAPERED W/ NITINOL LOOP (MISCELLANEOUS) ×1 IMPLANT
NEEDLE TAPERED W/ NITINOL LOOP (MISCELLANEOUS) ×2 IMPLANT
NS IRRIG 1000ML POUR BTL (IV SOLUTION) ×2 IMPLANT
PACK SHOULDER (CUSTOM PROCEDURE TRAY) ×2 IMPLANT
PAD ARMBOARD 7.5X6 YLW CONV (MISCELLANEOUS) ×2 IMPLANT
PAD COLD SHLDR WRAP-ON (PAD) ×2 IMPLANT
PIN NITINOL TARGETER 2.8 (PIN) IMPLANT
PIN SET MODULAR GLENOID SYSTEM (PIN) ×1 IMPLANT
RESTRAINT HEAD UNIVERSAL NS (MISCELLANEOUS) ×2 IMPLANT
SCREW CENTRAL MODULAR 25 (Screw) ×1 IMPLANT
SCREW PERI LOCK 5.5X16 (Screw) ×2 IMPLANT
SCREW PERI LOCK 5.5X24 (Screw) ×1 IMPLANT
SCREW PERI LOCK 5.5X32 (Screw) ×1 IMPLANT
SLING ARM FOAM STRAP LRG (SOFTGOODS) IMPLANT
SLING ARM FOAM STRAP MED (SOFTGOODS) ×1 IMPLANT
SPONGE T-LAP 4X18 ~~LOC~~+RFID (SPONGE) ×2 IMPLANT
STEM HUMERAL UNI REVERS SZ9 (Stem) ×1 IMPLANT
SUCTION FRAZIER HANDLE 12FR (TUBING) ×2
SUCTION TUBE FRAZIER 12FR DISP (TUBING) ×1 IMPLANT
SUT FIBERWIRE #2 38 T-5 BLUE (SUTURE)
SUT MNCRL AB 3-0 PS2 18 (SUTURE) ×2 IMPLANT
SUT MON AB 2-0 CT1 36 (SUTURE) ×2 IMPLANT
SUT VIC AB 1 CT1 36 (SUTURE) ×2 IMPLANT
SUTURE FIBERWR #2 38 T-5 BLUE (SUTURE) IMPLANT
SUTURE TAPE 1.3 40 TPR END (SUTURE) ×2 IMPLANT
SUTURETAPE 1.3 40 TPR END (SUTURE) ×4
TOWEL OR 17X26 10 PK STRL BLUE (TOWEL DISPOSABLE) ×2 IMPLANT
TOWEL OR NON WOVEN STRL DISP B (DISPOSABLE) ×2 IMPLANT
TUBE SUCTION HIGH CAP CLEAR NV (SUCTIONS) ×2 IMPLANT
WATER STERILE IRR 1000ML POUR (IV SOLUTION) ×4 IMPLANT
YANKAUER SUCT BULB TIP 10FT TU (MISCELLANEOUS) IMPLANT

## 2021-12-10 NOTE — Anesthesia Preprocedure Evaluation (Addendum)
Anesthesia Evaluation  Patient identified by MRN, date of birth, ID band Patient awake    Reviewed: Allergy & Precautions, NPO status , Patient's Chart, lab work & pertinent test results  Airway Mallampati: II  TM Distance: >3 FB Neck ROM: Full    Dental no notable dental hx.    Pulmonary asthma ,    Pulmonary exam normal breath sounds clear to auscultation       Cardiovascular hypertension, Pt. on medications Normal cardiovascular exam Rhythm:Regular Rate:Normal     Neuro/Psych negative neurological ROS  negative psych ROS   GI/Hepatic negative GI ROS, Neg liver ROS,   Endo/Other  negative endocrine ROS  Renal/GU negative Renal ROS     Musculoskeletal  (+) Arthritis ,   Abdominal   Peds  Hematology negative hematology ROS (+)   Anesthesia Other Findings right shoulder rotator cuff tear arthropathy  Reproductive/Obstetrics                            Anesthesia Physical Anesthesia Plan  ASA: 2  Anesthesia Plan: General and Regional   Post-op Pain Management: Regional block*   Induction: Intravenous  PONV Risk Score and Plan: 3 and Ondansetron, Dexamethasone, Midazolam and Treatment may vary due to age or medical condition  Airway Management Planned: Oral ETT  Additional Equipment:   Intra-op Plan:   Post-operative Plan: Extubation in OR  Informed Consent: I have reviewed the patients History and Physical, chart, labs and discussed the procedure including the risks, benefits and alternatives for the proposed anesthesia with the patient or authorized representative who has indicated his/her understanding and acceptance.     Dental advisory given  Plan Discussed with: CRNA  Anesthesia Plan Comments:        Anesthesia Quick Evaluation

## 2021-12-10 NOTE — Transfer of Care (Signed)
Immediate Anesthesia Transfer of Care Note  Patient: Felicia Gonzalez  Procedure(s) Performed: REVERSE SHOULDER ARTHROPLASTY (Right: Shoulder)  Patient Location: PACU  Anesthesia Type:General  Level of Consciousness: drowsy  Airway & Oxygen Therapy: Patient Spontanous Breathing and Patient connected to face mask oxygen  Post-op Assessment: Report given to RN and Post -op Vital signs reviewed and stable  Post vital signs: Reviewed and stable  Last Vitals:  Vitals Value Taken Time  BP 163/104 12/10/21 1716  Temp    Pulse 95 12/10/21 1718  Resp 29 12/10/21 1718  SpO2 100 % 12/10/21 1718  Vitals shown include unvalidated device data.  Last Pain:  Vitals:   12/10/21 1412  TempSrc:   PainSc: 0-No pain      Patients Stated Pain Goal: 3 (41/63/84 5364)  Complications:  Encounter Notable Events  Notable Event Outcome Phase Comment  Difficult to intubate - unexpected  Intraprocedure Filed from anesthesia note documentation.

## 2021-12-10 NOTE — Progress Notes (Signed)
Pt currently sitting upright in bed with 100 percent non re breather mask on. Pt states the breathing is easier now following neb tx.

## 2021-12-10 NOTE — Evaluation (Signed)
Occupational Therapy Evaluation Patient Details Name: Felicia Gonzalez MRN: [id number] DOB: [birth year] Today's Date: 12/10/2021   History of Present Illness Patient is a 81 year old woman here for R shoulder replacement.   Clinical Impression   Patient presents today prior to shoulder surgery with daughter. Therapist provided education and instruction to patient and daighter in regards to exercises, precautions, positioning, donning upper extremity clothing and bathing while maintaining shoulder precautions, ice and edema management with ice cuff and cooler and sling wear schedule. Patient and daughter verbalized understanding. Handouts provided to maximize education.  Discussed alternate ways for pendulum exercise given patient's impaired balance. Patient unable to use walker due to NWB status - educated patient to use cane and have min assist from family. Patient has 24/7 assistance.  Patient to follow up with MD for further therapy needs.       Recommendations for follow up therapy are one component of a multi-disciplinary discharge planning process, led by the attending physician.  Recommendations may be updated based on patient status, additional functional criteria and insurance authorization.   Follow Up Recommendations  Follow physician's recommendations for discharge plan and follow up therapies    Assistance Recommended at Discharge Frequent or constant Supervision/Assistance  Patient can return home with the following A little help with walking and/or transfers;A little help with bathing/dressing/bathroom;Assistance with cooking/housework;Help with stairs or ramp for entrance    Functional Status Assessment  Patient has had a recent decline in their functional status and demonstrates the ability to make significant improvements in function in a reasonable and predictable amount of time.  Equipment Recommendations  None recommended by OT    Recommendations for Other Services        Precautions / Restrictions Precautions Precautions: Shoulder Precaution Comments: If sitting in controlled environment, ok to come out of sling to give neck a break. Please sleep in it to protect until follow up in office. OK to use operative arm for feeding, hygiene and ADLs.   Ok to instruct Pendulums and lap slides as exercises. Ok to use operative arm within the following parameters for ADL purposes  New ROM (8/18)   Ok for PROM, AAROM, AROM within pain tolerance and within the following ROM   ER 20   ABD 45   FE 60 Required Braces or Orthoses: Sling Restrictions Weight Bearing Restrictions: Yes RUE Weight Bearing: Non weight bearing      Mobility Bed Mobility                    Transfers                   General transfer comment: Uses walker for mobility. Discussed NWB status - and use of cane and assistance from family for safe ambulation.      Balance Overall balance assessment: Mild deficits observed, not formally tested                                         ADL either performed or assessed with clinical judgement   ADL Overall ADL's : Independent                                             Vision Patient Visual Report: No change from baseline  Perception     Praxis      Pertinent Vitals/Pain Pain Assessment Pain Assessment: No/denies pain     Hand Dominance     Extremity/Trunk Assessment             Communication     Cognition Arousal/Alertness: Awake/alert Behavior During Therapy: WFL for tasks assessed/performed Overall Cognitive Status: Within Functional Limits for tasks assessed                                       General Comments       Exercises     Shoulder Instructions Shoulder Instructions Donning/doffing shirt without moving shoulder: Caregiver independent with task Method for sponge bathing under operated UE: Caregiver independent with  task Donning/doffing sling/immobilizer: Caregiver independent with task Correct positioning of sling/immobilizer: Caregiver independent with task Pendulum exercises (written home exercise program): Caregiver independent with task ROM for elbow, wrist and digits of operated UE: Caregiver independent with task Sling wearing schedule (on at all times/off for ADL's): Caregiver independent with task Proper positioning of operated UE when showering: Caregiver independent with task Dressing change: Caregiver independent with task Positioning of UE while sleeping: Caregiver independent with task    Home Living Family/patient expects to be discharged to:: Private residence Living Arrangements: Children;Other relatives Available Help at Discharge: Family;Available 24 hours/day                                    Prior Functioning/Environment                          OT Problem List: Decreased strength;Decreased range of motion;Impaired UE functional use;Pain;Impaired sensation      OT Treatment/Interventions:      OT Goals(Current goals can be found in the care plan section) Acute Rehab OT Goals OT Goal Formulation: All assessment and education complete, DC therapy  OT Frequency:      Co-evaluation              AM-PAC OT "6 Clicks" Daily Activity     Outcome Measure Help from another person eating meals?: A Little Help from another person taking care of personal grooming?: A Little Help from another person toileting, which includes using toliet, bedpan, or urinal?: A Little Help from another person bathing (including washing, rinsing, drying)?: A Little Help from another person to put on and taking off regular upper body clothing?: A Lot Help from another person to put on and taking off regular lower body clothing?: A Little 6 Click Score: 17   End of Session Nurse Communication:  (OT education complete)  Activity Tolerance: Patient tolerated treatment  well Patient left: in bed;with call bell/phone within reach;with family/visitor present  OT Visit Diagnosis: Muscle weakness (generalized) (M62.81)                Time: 3903-0092 OT Time Calculation (min): 32 min Charges:  OT General Charges $OT Visit: 1 Visit OT Evaluation $OT Eval Low Complexity: 1 Low OT Treatments $Self Care/Home Management : 8-22 mins  Margaree Sandhu, OTR/L [place]  Office [phone] Pager: [phone]   Lenward Chancellor 12/10/2021, 1:48 PM

## 2021-12-10 NOTE — Progress Notes (Signed)
Notified Dr Roanna Banning pt is complaining of SOB feeling and slight tightness of chest. All VS's stable. Pt still on O2 2L/M / [place]. Daughter at bedside.

## 2021-12-10 NOTE — Progress Notes (Signed)
Assisted Dr. Ellender with right, interscalene , ultrasound guided block. Side rails up, monitors on throughout procedure. See vital signs in flow sheet. Tolerated Procedure well. ?

## 2021-12-10 NOTE — Anesthesia Procedure Notes (Signed)
Anesthesia Regional Block: Interscalene brachial plexus block   Pre-Anesthetic Checklist: , timeout performed,  Correct Patient, Correct Site, Correct Laterality,  Correct Procedure, Correct Position, site marked,  Risks and benefits discussed,  Surgical consent,  Pre-op evaluation,  At surgeon's request and post-op pain management  Laterality: Right  Prep: chloraprep       Needles:  Injection technique: Single-shot  Needle Type: Echogenic Stimulator Needle     Needle Length: 9cm  Needle Gauge: 21     Additional Needles:   Procedures:,,,, ultrasound used (permanent image in chart),,    Narrative:  Start time: 12/10/2021 2:00 PM End time: 12/10/2021 2:10 PM Injection made incrementally with aspirations every 5 mL.  Performed by: Personally  Anesthesiologist: Murvin Natal, MD  Additional Notes: Functioning IV was confirmed and monitors were applied.  A timeout was performed. Sterile prep, hand hygiene and sterile gloves were used. A 66m 21ga Arrow echogenic stimulator needle was used. Negative aspiration and negative test dose prior to incremental administration of local anesthetic. The patient tolerated the procedure well.  Ultrasound guidance: relevent anatomy identified, needle position confirmed, local anesthetic spread visualized around nerve(s), vascular puncture avoided.  Image printed for medical record.

## 2021-12-10 NOTE — Anesthesia Postprocedure Evaluation (Signed)
Anesthesia Post Note  Patient: Felicia Gonzalez  Procedure(s) Performed: REVERSE SHOULDER ARTHROPLASTY (Right: Shoulder)     Patient location during evaluation: PACU Anesthesia Type: Regional and General Level of consciousness: awake Pain management: pain level controlled Vital Signs Assessment: post-procedure vital signs reviewed and stable Respiratory status: spontaneous breathing, nonlabored ventilation, respiratory function stable and patient connected to nasal cannula oxygen Cardiovascular status: blood pressure returned to baseline and stable Postop Assessment: no apparent nausea or vomiting Anesthetic complications: no   Encounter Notable Events  Notable Event Outcome Phase Comment  Difficult to intubate - unexpected  Intraprocedure Filed from anesthesia note documentation.    Last Vitals:  Vitals:   12/10/21 1800 12/10/21 1815  BP: (!) 160/74 (!) 143/77  Pulse: (!) 36 79  Resp: 14 14  Temp: 36.5 C 36.5 C  SpO2: 93% 95%    Last Pain:  Vitals:   12/10/21 1815  TempSrc:   PainSc: 0-No pain                 Octavio Matheney P Adell Koval

## 2021-12-10 NOTE — Discharge Instructions (Signed)

## 2021-12-10 NOTE — H&P (Signed)
Felicia Gonzalez    Chief Complaint: right shoulder rotator cuff tear arthropathy HPI: The patient is a 81 y.o. female with chronic and progressively increasing right shoulder pain related to severe rotator cuff tear arthropathy.  Due to her increasing functional limitations and failure to respond to prolonged attempts at conservative management, she is brought to the operating room this time for planned right shoulder reverse arthroplasty  Past Medical History:  Diagnosis Date   Anemia    Arthritis    rheumatoid +CCP neg RF +ANA synovitis    Asthma    Breast cancer ([place]) 2018   Left Breast Cancer   Cancer ([place])    Breast cancer   Glaucoma    right eye   Hypertension    Osteoporosis    Personal history of radiation therapy 09/2016   Left Breast Cancer      Past Surgical History:  Procedure Laterality Date   BREAST BIOPSY Left 05/14/2016   x2   BREAST BIOPSY Right 05/14/2016   BREAST LUMPECTOMY Left 07/09/2016   BREAST LUMPECTOMY WITH RADIOACTIVE SEED AND SENTINEL LYMPH NODE BIOPSY Left 07/09/2016   Procedure: BREAST LUMPECTOMY WITH RADIOACTIVE SEED AND SENTINEL LYMPH NODE BIOPSY;  Surgeon: Coralie Keens, MD;  Location: [facility];  Service: General;  Laterality: Left;   GLAUCOMA SURGERY     NO PAST SURGERIES     TOTAL HIP ARTHROPLASTY Left 10/01/2014   Procedure: TOTAL HIP ARTHROPLASTY;  Surgeon: Garald Balding, MD;  Location: [facility];  Service: Orthopedics;  Laterality: Left;    Family History  Problem Relation Age of Onset   Hypertension Father     Social History:  reports that she has never smoked. She has never used smokeless tobacco. She reports that she does not drink alcohol and does not use drugs.  BMI: Estimated body mass index is 26.18 kg/Felicia as calculated from the following:   Height as of this encounter: 5' (1.524 Felicia).   Weight as of this encounter: 60.8 kg.  Lab Results  Component Value Date   ALBUMIN 3.6 10/07/2021   Diabetes: Patient does not have a  diagnosis of diabetes.     Smoking Status:   reports that she has never smoked. She has never used smokeless tobacco.     Medications Prior to Admission  Medication Sig Dispense Refill   Abatacept (ORENCIA) 125 MG/ML SOSY INJECT 125 MG INTO THE SKIN ONCE A WEEK. 12 mL 0   acetaminophen (TYLENOL) 650 MG CR tablet Take 1,300 mg by mouth every 8 (eight) hours as needed for pain.     albuterol (PROVENTIL HFA;VENTOLIN HFA) 108 (90 Base) MCG/ACT inhaler Inhale 1-2 puffs into the lungs 2 (two) times daily as needed for wheezing or shortness of breath.     amLODipine (NORVASC) 5 MG tablet Take 5 mg by mouth daily.      Cholecalciferol (VITAMIN D3) 5000 units TABS Take 5,000 Units by mouth daily after breakfast. With K2     leflunomide (ARAVA) 20 MG tablet TAKE 1 TABLET BY MOUTH EVERY DAY 90 tablet 0   Multiple Vitamin (MULTIVITAMIN WITH MINERALS) TABS tablet Take 1 tablet by mouth daily.     Omega-3 Fatty Acids (OMEGA-3 FISH OIL PO) Take 1 capsule by mouth daily. vegetarian     Polyethyl Glycol-Propyl Glycol (SYSTANE ULTRA OP) Place 1 drop into both eyes daily.     predniSONE (DELTASONE) 5 MG tablet TAKE 1 TABLET BY MOUTH EVERY DAY WITH BREAKFAST (Patient taking differently: Take 10 mg  by mouth daily.) 90 tablet 0   Pyridoxine HCl (VITAMIN B-6 PO) Take 1 tablet by mouth daily.     TURMERIC PO Take 1 capsule by mouth daily.     vitamin B-12 (CYANOCOBALAMIN) 500 MCG tablet Take 500 mcg by mouth daily.     denosumab (PROLIA) 60 MG/ML SOSY injection Inject 60 mg into the skin every 6 (six) months.     raloxifene (EVISTA) 60 MG tablet Take 1 tablet (60 mg total) by mouth daily. (Patient not taking: Reported on 11/25/2021) 90 tablet 3     Physical Exam: Right shoulder demonstrates painful and guarded motion as noted at her recent office visits.  She has global weakness to manual muscle testing.  Otherwise remains neurovascular intact.  Radiographs  Plain films of the right shoulder confirm changes  consistent with chronic rotator cuff tear arthropathy  Vitals  Temp:  [98 F (36.7 C)] 98 F (36.7 C) (07/13 1254) Pulse Rate:  [100] 100 (07/13 1254) Resp:  [16] 16 (07/13 1254) BP: (166)/(81) 166/81 (07/13 1254) SpO2:  [97 %] 97 % (07/13 1254) Weight:  [60.8 kg] 60.8 kg (07/13 1257)  Assessment/Plan  Impression: right shoulder rotator cuff tear arthropathy  Plan of Action: Procedure(s): REVERSE SHOULDER ARTHROPLASTY  Felicia Gonzalez Felicia Gonzalez 12/10/2021, 2:39 PM Contact # [phone]

## 2021-12-10 NOTE — Op Note (Signed)
12/10/2021  4:51 PM  PATIENT:   Felicia Gonzalez  81 y.o. female  PRE-OPERATIVE DIAGNOSIS:  right shoulder rotator cuff tear arthropathy  POST-OPERATIVE DIAGNOSIS: Same  PROCEDURE: Right shoulder reverse arthroplasty lysing a press-fit size 9 Arthrex stem with a neutral metaphysis, +6 polyethylene insert, 36/+4 glenosphere and a small/+2 baseplate  SURGEON:  Terance Pomplun, Metta Clines M.D.  ASSISTANTS: Jenetta Loges, PA-C  ANESTHESIA:   General endotracheal and interscalene block with Exparel  EBL: 200 cc  SPECIMEN: None  Drains: None   PATIENT DISPOSITION:  PACU - hemodynamically stable.    PLAN OF CARE: Discharge to home after PACU  Brief history:  Patient is a 81 year old female with chronic and progressively increasing right shoulder pain related to severe rotator cuff tear arthropathy.  Due to her increasing functional limitations and failure to respond to prolonged attempts at conservative management, she is brought to the operating room at this time for planned right shoulder reverse arthroplasty  Preoperatively, I counseled the patient regarding treatment options and risks versus benefits thereof.  Possible surgical complications were all reviewed including potential for bleeding, infection, neurovascular injury, persistent pain, loss of motion, anesthetic complication, failure of the implant, and possible need for additional surgery. They understand and accept and agrees with our planned procedure.   Procedure in detail:  After undergoing routine preop evaluation the patient received prophylactic antibiotics and interscalene block with Exparel was established in the holding area by the anesthesia department.  Subsequently brought to the operating room placed spine on the operating table and underwent the smooth induction of a general endotracheal anesthesia.  This did require the use of the glide scope.  Placed in the beachchair position and appropriately padded and protected.  The  right shoulder girdle region was sterilely prepped and draped in standard fashion.  Timeout was called.  A deltopectoral approach of the right shoulder is made an approximately 8 cm incision.  Skin flaps were elevated dissection carried deeply and the deltopectoral interval was developed from proximal to distal with the vein taken laterally.  Conjoined tendon was mobilized and retracted medially.  The long head biceps tendon was tenodesed at the upper border of the pectoralis major with the proximal segment unroofed and excised.  The subscapularis was then separated from the lesser tuberosity using electrocautery and was freed up and mobilized from the undersurface of the coracoid and confirming good elasticity and the free margin was then tagged with a pair of suture tape sutures.  Capsular attachments were then divided from the anterior and inferior margins of the humeral neck and the humeral head was then delivered through the wound.  An extra medullary guide was then used to outline the proposed humeral head resection which we performed with an oscillating saw at approximate 20 degrees retroversion.  A metal cap was then placed over the cut proximal humeral surface.  We then exposed the glenoid and there was severe retroversion and so returned our attention to the mid humeral metaphysis where an additional 2 mm of bone was resected with the oscillating saw.  This then allowed [place] to properly expose the glenoid and a circumferential labral resection was completed.  A guidepin was then directed into the glenoid correcting for the retroversion and the glenoid was then reamed with the central followed by the peripheral reamer to a stable subchondral bony bed.  Preparation completed with the central drill and tapped for a 25 mm lag screw.  The baseplate was then assembled and inserted with vancomycin  powder applied to the threads of the lag screw with good purchase and fixation was achieved.  All of the peripheral  locking screws were then placed using standard technique with good fixation.  A 36/+4 glenosphere was then impacted onto the baseplate and the central locking screw was placed.  Attention returned back to the humeral metaphysis where the canal was opened with hand reaming and broached up to a size 9 at approximately 20 degrees of retroversion.  A neutral metaphyseal reaming guide was then used to prepare the metaphysis.  Trial implant was placed and reduction showed good motion good stability and good soft tissue balance.  The trial was then removed.  The final implant was assembled.  The canal was irrigated cleaned and dried and vancomycin powder spread into the humeral canal.  Our final implant was then seated with excellent purchase and fixation.  A series of trial reductions ultimately showed that a +6 poly gave is the best motion stability and soft tissue balance.  The trial poly was then removed.  The final +6 poly was then impacted after the implant was cleaned and dried.  Final reduction was performed which again showed excellent motion good stability and good soft tissue balance.  We then confirmed good mobility of the subscapularis and it was repaired back to the eyelets on the collar the implant using the previously placed suture tape sutures.  Arm easily achieved 45 degrees of external rotation without excessive tension on the subscap repair.  The wound was again irrigated hemostasis was obtained.  The balance of the vancomycin powder was then sprayed liberally throughout the deep soft tissue planes.  The deltopectoral interval was reapproximated with a series of figure-of-eight and 1 Vicryl sutures.  2-0 Monocryl used to close the subcu layer and intracuticular 3-0 Monocryl used to close the skin followed by Dermabond and Aquacel dressing.  The right arm was then placed into a sling.  The patient was awakened, extubated, and taken to the recovery room in stable condition.  Jenetta Loges, PA-C was  utilized as an Environmental consultant throughout this case, essential for help with positioning the patient, positioning extremity, tissue manipulation, implantation of the prosthesis, suture management, wound closure, and intraoperative decision-making.  Marin Shutter MD   Contact # [phone]

## 2021-12-10 NOTE — Anesthesia Procedure Notes (Signed)
Procedure Name: Intubation Date/Time: 12/10/2021 3:36 PM  Performed by: Milford Cage, CRNAPre-anesthesia Checklist: Patient identified, Emergency Drugs available, Suction available and Patient being monitored Patient Re-evaluated:Patient Re-evaluated prior to induction Oxygen Delivery Method: Circle system utilized Preoxygenation: Pre-oxygenation with 100% oxygen Induction Type: IV induction Ventilation: Mask ventilation without difficulty Laryngoscope Size: 2, Glidescope and 3 Grade View: Grade I Tube type: Oral Tube size: 7.0 mm Number of attempts: 3 Airway Equipment and Method: Rigid stylet and Video-laryngoscopy Placement Confirmation: ETT inserted through vocal cords under direct vision, positive ETCO2 and breath sounds checked- equal and bilateral Secured at: 21 cm Tube secured with: Tape Dental Injury: Teeth and Oropharynx as per pre-operative assessment  Difficulty Due To: Difficulty was unanticipated, Difficult Airway- due to anterior larynx and Difficult Airway- due to limited oral opening Future Recommendations: Recommend- induction with short-acting agent, and alternative techniques readily available Comments: Grade 4 viewwith MAC3. Grade 2b view with Mil2 but unable to pass tube. Grade 1 with glidescope - successful intubation

## 2021-12-10 NOTE — Care Plan (Signed)
Ortho Bundle Case Management Note  Patient Details  Name: Felicia Gonzalez MRN: [id number] Date of Birth: April 26, 1941                  R Rev TSA on 12-10-21 DCP: Home with dtr and son-in-law DME: Sling and CTU provided by hosp PT: EO   DME Arranged:  N/A DME Agency:     HH Arranged:    [facility] Agency:     Additional Comments: Please contact me with any questions of if this plan should need to change.  Marianne Sofia, RN,CCM [facility]  [phone] 12/10/2021, 2:22 PM

## 2021-12-15 ENCOUNTER — Encounter (HOSPITAL_COMMUNITY): Payer: Self-pay | Admitting: Orthopedic Surgery

## 2021-12-18 DIAGNOSIS — I10 Essential (primary) hypertension: Secondary | ICD-10-CM | POA: Diagnosis not present

## 2021-12-18 DIAGNOSIS — D649 Anemia, unspecified: Secondary | ICD-10-CM | POA: Diagnosis not present

## 2021-12-18 DIAGNOSIS — Z7982 Long term (current) use of aspirin: Secondary | ICD-10-CM | POA: Diagnosis not present

## 2021-12-18 DIAGNOSIS — Z7951 Long term (current) use of inhaled steroids: Secondary | ICD-10-CM | POA: Diagnosis not present

## 2021-12-18 DIAGNOSIS — M810 Age-related osteoporosis without current pathological fracture: Secondary | ICD-10-CM | POA: Diagnosis not present

## 2021-12-18 DIAGNOSIS — R739 Hyperglycemia, unspecified: Secondary | ICD-10-CM | POA: Diagnosis not present

## 2021-12-18 DIAGNOSIS — M19012 Primary osteoarthritis, left shoulder: Secondary | ICD-10-CM | POA: Diagnosis not present

## 2021-12-18 DIAGNOSIS — Z7952 Long term (current) use of systemic steroids: Secondary | ICD-10-CM | POA: Diagnosis not present

## 2021-12-18 DIAGNOSIS — M069 Rheumatoid arthritis, unspecified: Secondary | ICD-10-CM | POA: Diagnosis not present

## 2021-12-18 DIAGNOSIS — J4520 Mild intermittent asthma, uncomplicated: Secondary | ICD-10-CM | POA: Diagnosis not present

## 2021-12-18 DIAGNOSIS — M19011 Primary osteoarthritis, right shoulder: Secondary | ICD-10-CM | POA: Diagnosis not present

## 2021-12-18 DIAGNOSIS — E7800 Pure hypercholesterolemia, unspecified: Secondary | ICD-10-CM | POA: Diagnosis not present

## 2021-12-18 DIAGNOSIS — Z9181 History of falling: Secondary | ICD-10-CM | POA: Diagnosis not present

## 2021-12-21 ENCOUNTER — Other Ambulatory Visit (HOSPITAL_COMMUNITY): Payer: Self-pay

## 2021-12-21 DIAGNOSIS — Z5189 Encounter for other specified aftercare: Secondary | ICD-10-CM | POA: Diagnosis not present

## 2021-12-22 ENCOUNTER — Other Ambulatory Visit: Payer: Self-pay | Admitting: Physician Assistant

## 2021-12-22 ENCOUNTER — Other Ambulatory Visit: Payer: Self-pay | Admitting: Rheumatology

## 2021-12-22 NOTE — Telephone Encounter (Signed)
Next Visit: 03/03/2022   Last Visit: 11/25/2021   Last Fill: 09/25/2021  DX: Rheumatoid arthritis of multiple sites without organ or system involvement with positive rheumatoid factor    Current Dose per office note 11/25/2021: prednisone 5 mg 1 tablet daily.   Okay to refill Prednisone?

## 2021-12-22 NOTE — Telephone Encounter (Signed)
Next Visit: 03/03/2022  Last Visit: 11/25/2021  Last Fill: 09/14/2021  DX: Rheumatoid arthritis of multiple sites without organ or system involvement with positive rheumatoid factor   Current Dose per office note 11/25/2021: Arava 20 mg 1 tablet daily  Labs: 12/02/2021 Glucose 126, Hgb 11.8, RDW 16.0  Okay to refill Arava?

## 2021-12-23 ENCOUNTER — Other Ambulatory Visit (HOSPITAL_COMMUNITY): Payer: Self-pay

## 2021-12-23 DIAGNOSIS — D649 Anemia, unspecified: Secondary | ICD-10-CM | POA: Diagnosis not present

## 2021-12-23 DIAGNOSIS — J4520 Mild intermittent asthma, uncomplicated: Secondary | ICD-10-CM | POA: Diagnosis not present

## 2021-12-23 DIAGNOSIS — Z7951 Long term (current) use of inhaled steroids: Secondary | ICD-10-CM | POA: Diagnosis not present

## 2021-12-23 DIAGNOSIS — I10 Essential (primary) hypertension: Secondary | ICD-10-CM | POA: Diagnosis not present

## 2021-12-23 DIAGNOSIS — M19011 Primary osteoarthritis, right shoulder: Secondary | ICD-10-CM | POA: Diagnosis not present

## 2021-12-23 DIAGNOSIS — Z9181 History of falling: Secondary | ICD-10-CM | POA: Diagnosis not present

## 2021-12-23 DIAGNOSIS — E7800 Pure hypercholesterolemia, unspecified: Secondary | ICD-10-CM | POA: Diagnosis not present

## 2021-12-23 DIAGNOSIS — Z7982 Long term (current) use of aspirin: Secondary | ICD-10-CM | POA: Diagnosis not present

## 2021-12-23 DIAGNOSIS — M810 Age-related osteoporosis without current pathological fracture: Secondary | ICD-10-CM | POA: Diagnosis not present

## 2021-12-23 DIAGNOSIS — R739 Hyperglycemia, unspecified: Secondary | ICD-10-CM | POA: Diagnosis not present

## 2021-12-23 DIAGNOSIS — Z7952 Long term (current) use of systemic steroids: Secondary | ICD-10-CM | POA: Diagnosis not present

## 2021-12-23 DIAGNOSIS — M069 Rheumatoid arthritis, unspecified: Secondary | ICD-10-CM | POA: Diagnosis not present

## 2021-12-23 DIAGNOSIS — M19012 Primary osteoarthritis, left shoulder: Secondary | ICD-10-CM | POA: Diagnosis not present

## 2021-12-25 ENCOUNTER — Other Ambulatory Visit (HOSPITAL_COMMUNITY): Payer: Self-pay

## 2021-12-30 ENCOUNTER — Other Ambulatory Visit (HOSPITAL_COMMUNITY): Payer: Self-pay

## 2021-12-30 ENCOUNTER — Other Ambulatory Visit: Payer: Self-pay | Admitting: Pharmacist

## 2021-12-30 DIAGNOSIS — M0579 Rheumatoid arthritis with rheumatoid factor of multiple sites without organ or systems involvement: Secondary | ICD-10-CM

## 2021-12-30 MED ORDER — ORENCIA 125 MG/ML ~~LOC~~ SOSY
125.0000 mg | PREFILLED_SYRINGE | SUBCUTANEOUS | 0 refills | Status: DC
  Filled 2021-12-30 – 2022-01-04 (×2): qty 8, 56d supply, fill #0

## 2021-12-30 NOTE — Telephone Encounter (Signed)
Rx for Orencia '125mg'$  every 7 days sent to [facility] for hospital-based cost pricing.   Total qty remaining is 93m    DKnox Saliva PharmD, MPH, BCPS, CPP Clinical Pharmacist (Rheumatology and Pulmonology)

## 2021-12-31 ENCOUNTER — Telehealth: Payer: Self-pay | Admitting: Rheumatology

## 2021-12-31 NOTE — Telephone Encounter (Signed)
She can get labs in [place] which should include CBC with differential and CMP with GFR.  Please check her chest x-ray status as her TB Gold has been positive in the past.

## 2021-12-31 NOTE — Telephone Encounter (Signed)
Patient's daughter Rodman Pickle called stating Pachia had shoulder surgery and will be recovering at her home in [place] until March.  Patient is scheduled for an appointment in November, but is asking if she needs an office visit or if she could just have [facility] in [place].

## 2021-12-31 NOTE — Telephone Encounter (Signed)
Attempted to contact [facility] and left message for patient to call the office.

## 2021-12-31 NOTE — Telephone Encounter (Signed)
Patient's daughter advised patient can get labs in [place] which should include CBC with differential and CMP with GFR.  Patient is due for labs in October 2023 and the January 2024. Patient will need to update her chest x-ray in January 2024. Anuja states she will contact the office prior to taking Jovie for labs.

## 2022-01-04 ENCOUNTER — Other Ambulatory Visit (HOSPITAL_COMMUNITY): Payer: Self-pay

## 2022-01-05 ENCOUNTER — Other Ambulatory Visit (HOSPITAL_COMMUNITY): Payer: Self-pay

## 2022-01-09 ENCOUNTER — Other Ambulatory Visit (HOSPITAL_COMMUNITY): Payer: Self-pay

## 2022-01-20 ENCOUNTER — Other Ambulatory Visit (HOSPITAL_COMMUNITY): Payer: Self-pay

## 2022-02-09 ENCOUNTER — Telehealth: Payer: Self-pay | Admitting: *Deleted

## 2022-02-09 ENCOUNTER — Encounter: Payer: Self-pay | Admitting: *Deleted

## 2022-02-09 NOTE — Telephone Encounter (Signed)
Patient contacted the office and left message requesting a call back.   Returned call to patient. She states she is on Orencia and would like to get her annual flu vaccine. She would like to know if she should hold her Orencia when she gets her flu vaccine. She would also like to know if she is due for another Covid booster. Her last Covid booster was 02/11/2021. Please advise

## 2022-02-09 NOTE — Telephone Encounter (Signed)
COVID-19 vaccine recommendations:    If you are on Methotrexate, Cellcept (mycophenolate), Rinvoq, Morrie Sheldon, and Olumiant- hold the medication for 1 week after each vaccine. Hold Methotrexate for 2 weeks after the single dose COVID-19 vaccine.  If you are on Orencia subcutaneous injection - hold medication one week prior to and one week after the first COVID-19 vaccine dose (only).  If you are on Orencia IV infusions- time vaccination administration so that the first COVID-19 vaccination will occur four weeks after the infusion and postpone the subsequent infusion by one week.  Do not take Tylenol or any anti-inflammatory medications (NSAIDs) 24 hours prior to the COVID-19 vaccination.  Even if you are fully vaccinated, and you are on any medications that suppress your immune system, please continue to wear a mask, maintain at least six feet social distance and practice hand hygiene.   [email or web address]

## 2022-02-09 NOTE — Telephone Encounter (Signed)
Left message to advise patient to check her my chart for recommendations regarding vaccines. Sent Dr. Arlean Hopping recommendations to patient via my chart.

## 2022-02-24 ENCOUNTER — Ambulatory Visit: Payer: Medicare Other

## 2022-02-24 ENCOUNTER — Other Ambulatory Visit: Payer: Medicare Other

## 2022-02-24 ENCOUNTER — Ambulatory Visit: Payer: Medicare Other | Admitting: Hematology

## 2022-02-26 ENCOUNTER — Ambulatory Visit: Payer: Medicare Other | Admitting: Hematology

## 2022-02-26 ENCOUNTER — Other Ambulatory Visit (HOSPITAL_COMMUNITY): Payer: Self-pay

## 2022-02-26 ENCOUNTER — Other Ambulatory Visit: Payer: Medicare Other

## 2022-02-26 ENCOUNTER — Other Ambulatory Visit: Payer: Self-pay | Admitting: Rheumatology

## 2022-02-26 DIAGNOSIS — M0579 Rheumatoid arthritis with rheumatoid factor of multiple sites without organ or systems involvement: Secondary | ICD-10-CM

## 2022-02-26 MED ORDER — ORENCIA 125 MG/ML ~~LOC~~ SOSY
125.0000 mg | PREFILLED_SYRINGE | SUBCUTANEOUS | 0 refills | Status: DC
  Filled 2022-02-26: qty 12, 84d supply, fill #0
  Filled 2022-03-22: qty 4, 28d supply, fill #0
  Filled 2022-04-05: qty 8, 56d supply, fill #1

## 2022-02-26 NOTE — Telephone Encounter (Signed)
Next Visit: 04/14/2022  Last Visit: 11/25/2021  Last Fill: 12/30/2021  DX: Rheumatoid arthritis of multiple sites without organ or system involvement with positive rheumatoid factor  Current Dose per office note on 11/25/2021: Orencia 125 mg subq every 7 days   Labs: 12/02/2021 CBC: hemoglobin 11.8, RDW 16.0 BMP: glucose 126  Chest x-ray: 06/08/2021  Okay to refill orencia?

## 2022-03-03 ENCOUNTER — Ambulatory Visit: Payer: Medicare Other | Admitting: Rheumatology

## 2022-03-12 ENCOUNTER — Telehealth: Payer: Self-pay | Admitting: *Deleted

## 2022-03-12 DIAGNOSIS — Z96611 Presence of right artificial shoulder joint: Secondary | ICD-10-CM | POA: Diagnosis not present

## 2022-03-12 NOTE — Telephone Encounter (Signed)
Patient's daughter Rodman Pickle contacted the office to inquire if patient is due to update labs. Patient is due to update labs. Advised Anuja. She states the patient is in [place] with her and would like the orders faxed to a lab locally so she can have them done. Anuja will contact the office to provide fax number when she has it. Will fax orders once fax number received.

## 2022-03-15 ENCOUNTER — Other Ambulatory Visit: Payer: Self-pay | Admitting: *Deleted

## 2022-03-16 ENCOUNTER — Other Ambulatory Visit: Payer: Self-pay | Admitting: *Deleted

## 2022-03-16 ENCOUNTER — Telehealth: Payer: Self-pay | Admitting: *Deleted

## 2022-03-16 DIAGNOSIS — M05711 Rheumatoid arthritis with rheumatoid factor of right shoulder without organ or systems involvement: Secondary | ICD-10-CM

## 2022-03-16 DIAGNOSIS — R768 Other specified abnormal immunological findings in serum: Secondary | ICD-10-CM

## 2022-03-16 DIAGNOSIS — Z79899 Other long term (current) drug therapy: Secondary | ICD-10-CM

## 2022-03-16 DIAGNOSIS — Z96611 Presence of right artificial shoulder joint: Secondary | ICD-10-CM | POA: Diagnosis not present

## 2022-03-16 DIAGNOSIS — M0579 Rheumatoid arthritis with rheumatoid factor of multiple sites without organ or systems involvement: Secondary | ICD-10-CM

## 2022-03-16 NOTE — Telephone Encounter (Signed)
[facility] lab orders faxed to [phone] per daughter's voicemail.

## 2022-03-18 DIAGNOSIS — M05711 Rheumatoid arthritis with rheumatoid factor of right shoulder without organ or systems involvement: Secondary | ICD-10-CM | POA: Diagnosis not present

## 2022-03-18 DIAGNOSIS — M05712 Rheumatoid arthritis with rheumatoid factor of left shoulder without organ or systems involvement: Secondary | ICD-10-CM | POA: Diagnosis not present

## 2022-03-18 DIAGNOSIS — R768 Other specified abnormal immunological findings in serum: Secondary | ICD-10-CM | POA: Diagnosis not present

## 2022-03-18 DIAGNOSIS — Z79899 Other long term (current) drug therapy: Secondary | ICD-10-CM | POA: Diagnosis not present

## 2022-03-18 DIAGNOSIS — M0579 Rheumatoid arthritis with rheumatoid factor of multiple sites without organ or systems involvement: Secondary | ICD-10-CM | POA: Diagnosis not present

## 2022-03-21 ENCOUNTER — Other Ambulatory Visit: Payer: Self-pay | Admitting: Rheumatology

## 2022-03-22 ENCOUNTER — Other Ambulatory Visit (HOSPITAL_COMMUNITY): Payer: Self-pay

## 2022-03-22 NOTE — Telephone Encounter (Signed)
Next Visit: 04/14/2022   Last Visit: 11/25/2021   Last Fill: 12/22/2021  DX: Rheumatoid arthritis of multiple sites without organ or system involvement with positive rheumatoid factor   Current Dose per office note on 11/25/2021: Arava 20 mg 1 tablet daily, and prednisone 5 mg 1 tablet daily  Labs: 12/02/2021 CBC: hemoglobin 11.8, RDW 16.0 BMP: glucose 126  Patient's daughter aware patient to update lab results. Orders were faxed on 03/16/2022.  Okay to refill Arava and Prednisone?

## 2022-03-30 ENCOUNTER — Other Ambulatory Visit (HOSPITAL_COMMUNITY): Payer: Self-pay

## 2022-03-30 ENCOUNTER — Telehealth: Payer: Self-pay | Admitting: *Deleted

## 2022-03-30 NOTE — Telephone Encounter (Signed)
Labs received from:[facility]   Drawn on:03/18/2022  Reviewed by:Dr. Bo Merino   Labs drawn:CMP, CBC  Results:Hgb 11.5  MCHC 31.4  RDW 15.6  Platelets 393  Monocytes, Absolute 0.84  Patient is on Arava 20 mg po daily, Prednisone 5 mg po daily and Orencia 125 mg SQ weekly.

## 2022-04-02 ENCOUNTER — Telehealth: Payer: Self-pay | Admitting: *Deleted

## 2022-04-02 ENCOUNTER — Other Ambulatory Visit (HOSPITAL_COMMUNITY): Payer: Self-pay

## 2022-04-02 ENCOUNTER — Other Ambulatory Visit: Payer: Self-pay | Admitting: *Deleted

## 2022-04-02 NOTE — Telephone Encounter (Signed)
Patient's daughter called to verify that we received her labs. Advised we have received  those. She states the patient will be going back to [facility] on April 19, 2022. Patient is needing an override for vacation on her Orencia for an 80 day supply. Needs this by April 16, 2022,

## 2022-04-02 NOTE — Telephone Encounter (Signed)
Emailed [facility] to be see if vacation override DUR can be placed. If they cannot, we will likely have to transfer her rx to another pharmacy that ships nationally  Knox Saliva, PharmD, MPH, BCPS, CPP Clinical Pharmacist (Rheumatology and Pulmonology)

## 2022-04-05 ENCOUNTER — Other Ambulatory Visit (HOSPITAL_COMMUNITY): Payer: Self-pay

## 2022-04-05 NOTE — Progress Notes (Unsigned)
Office Visit Note  Patient: Felicia Gonzalez             Date of Birth: Oct 23, 1940           MRN: [id number]             PCP: Kathyrn Lass, MD Referring: Kathyrn Lass, MD Visit Date: 04/07/2022 Occupation: '@GUAROCC'$ @  Subjective:  No chief complaint on file.   History of Present Illness: Felicia Gonzalez is a 81 y.o. female ***   Activities of Daily Living:  Patient reports morning stiffness for *** {minute/hour:19697}.   Patient {ACTIONS;DENIES/REPORTS:21021675::"Denies"} nocturnal pain.  Difficulty dressing/grooming: {ACTIONS;DENIES/REPORTS:21021675::"Denies"} Difficulty climbing stairs: {ACTIONS;DENIES/REPORTS:21021675::"Denies"} Difficulty getting out of chair: {ACTIONS;DENIES/REPORTS:21021675::"Denies"} Difficulty using hands for taps, buttons, cutlery, and/or writing: {ACTIONS;DENIES/REPORTS:21021675::"Denies"}  No Rheumatology ROS completed.   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, estrogen receptor positive ([facility]) 10/04/2016   Osteoporosis 10/04/2016   High risk medication use 06/08/2016   Age-related osteoporosis without current pathological fracture 06/08/2016   ANA positive 06/08/2016   Moderate persistent asthma 06/08/2016   Normocytic anemia 10/01/2014   Rheumatoid arthritis of multiple sites without organ or system involvement with positive rheumatoid factor ([facility]) 10/01/2014   Subcapital fracture of left hip ([facility]) 10/01/2014    Past Medical History:  Diagnosis Date   Anemia    Arthritis    rheumatoid +CCP neg RF +ANA synovitis    Asthma    Breast cancer ([facility]) 2018   Left Breast Cancer   Cancer ([facility])    Breast cancer   Glaucoma    right eye   Hypertension    Osteoporosis    Personal history of  radiation therapy 09/2016   Left Breast Cancer    Family History  Problem Relation Age of Onset   Hypertension Father    Past Surgical History:  Procedure Laterality Date   BREAST BIOPSY Left 05/14/2016   x2   BREAST BIOPSY Right 05/14/2016   BREAST LUMPECTOMY Left 07/09/2016   BREAST LUMPECTOMY WITH RADIOACTIVE SEED AND SENTINEL LYMPH NODE BIOPSY Left 07/09/2016   Procedure: BREAST LUMPECTOMY WITH RADIOACTIVE SEED AND SENTINEL LYMPH NODE BIOPSY;  Surgeon: Coralie Keens, MD;  Location: [facility];  Service: General;  Laterality: Left;   GLAUCOMA SURGERY     NO PAST SURGERIES     REVERSE SHOULDER ARTHROPLASTY Right 12/10/2021   Procedure: REVERSE SHOULDER ARTHROPLASTY;  Surgeon: Justice Britain, MD;  Location: [facility];  Service: Orthopedics;  Laterality: Right;   TOTAL HIP ARTHROPLASTY Left 10/01/2014   Procedure: TOTAL HIP ARTHROPLASTY;  Surgeon: Garald Balding, MD;  Location: [facility];  Service: Orthopedics;  Laterality: Left;   Social History   Social History Narrative   Not on file   Immunization History  Administered Date(s) Administered   Influenza Split 03/02/2010   Influenza, High Dose Seasonal PF 04/11/2017, 04/17/2018, 01/30/2019   Influenza,inj,Quad PF,6+ Mos 02/06/2016   Influenza,inj,quad, With Preservative 05/03/2014, 02/11/2021   Influenza-Unspecified 02/09/2013   PFIZER Comirnaty(Gray Top)Covid-19 Tri-Sucrose Vaccine 09/23/2020   PFIZER(Purple Top)SARS-COV-2 Vaccination 06/20/2019, 07/11/2019, 02/06/2020, 02/18/2020, 09/23/2020   Pfizer Covid-19 Vaccine Bivalent Booster 71yr & up 02/11/2021   Pneumococcal Conjugate-13 05/03/2014   Pneumococcal Polysaccharide-23 08/01/1998, 08/10/2006, 10/02/2010   Td 10/08/1999   Tdap 10/02/2010  Zoster, Live 10/08/2020     Objective: Vital Signs: There were no vitals taken for this visit.   Physical Exam   Musculoskeletal Exam: ***  CDAI Exam: CDAI Score: -- Patient Global: --; Provider Global: -- Swollen: --; Tender:  -- Joint Exam 04/07/2022   No joint exam has been documented for this visit   There is currently no information documented on the homunculus. Go to the Rheumatology activity and complete the homunculus joint exam.  Investigation: No additional findings.  Imaging: No results found.  Recent Labs: Lab Results  Component Value Date   WBC 6.5 12/02/2021   HGB 11.8 (L) 12/02/2021   PLT 304 12/02/2021   NA 137 12/02/2021   K 4.8 12/02/2021   CL 101 12/02/2021   CO2 28 12/02/2021   GLUCOSE 126 (H) 12/02/2021   BUN 14 12/02/2021   CREATININE 0.81 12/02/2021   BILITOT 0.5 10/07/2021   ALKPHOS 51 10/07/2021   AST 31 10/07/2021   ALT 31 10/07/2021   PROT 6.7 10/07/2021   ALBUMIN 3.6 10/07/2021   CALCIUM 9.6 12/02/2021   GFRAA 97 07/16/2020   QFTBGOLD Negative 03/10/2016   QFTBGOLDPLUS POSITIVE (A) 07/21/2018    Speciality Comments: Orencia IV every 4 weeks  Procedures:  No procedures performed Allergies: Penicillins and Sulfa antibiotics   Assessment / Plan:     Visit Diagnoses: Positive QuantiFERON-TB Gold test  High risk medication use  Orders: No orders of the defined types were placed in this encounter.  No orders of the defined types were placed in this encounter.   Face-to-face time spent with patient was *** minutes. Greater than 50% of time was spent in counseling and coordination of care.  Follow-Up Instructions: No follow-ups on file.   Earnestine Mealing, CMA  Note - This record has been created using Editor, commissioning.  Chart creation errors have been sought, but may not always  have been located. Such creation errors do not reflect on  the standard of medical care.

## 2022-04-06 DIAGNOSIS — R0981 Nasal congestion: Secondary | ICD-10-CM | POA: Diagnosis not present

## 2022-04-06 DIAGNOSIS — R2689 Other abnormalities of gait and mobility: Secondary | ICD-10-CM | POA: Diagnosis not present

## 2022-04-06 DIAGNOSIS — R0689 Other abnormalities of breathing: Secondary | ICD-10-CM | POA: Diagnosis not present

## 2022-04-06 DIAGNOSIS — J4520 Mild intermittent asthma, uncomplicated: Secondary | ICD-10-CM | POA: Diagnosis not present

## 2022-04-07 ENCOUNTER — Ambulatory Visit: Payer: Medicare Other | Attending: Rheumatology | Admitting: Rheumatology

## 2022-04-07 ENCOUNTER — Encounter: Payer: Self-pay | Admitting: Rheumatology

## 2022-04-07 VITALS — BP 152/86 | HR 99 | Resp 17 | Ht 60.0 in | Wt 127.4 lb

## 2022-04-07 DIAGNOSIS — Z8709 Personal history of other diseases of the respiratory system: Secondary | ICD-10-CM

## 2022-04-07 DIAGNOSIS — Z96611 Presence of right artificial shoulder joint: Secondary | ICD-10-CM | POA: Diagnosis not present

## 2022-04-07 DIAGNOSIS — Z862 Personal history of diseases of the blood and blood-forming organs and certain disorders involving the immune mechanism: Secondary | ICD-10-CM | POA: Diagnosis not present

## 2022-04-07 DIAGNOSIS — R768 Other specified abnormal immunological findings in serum: Secondary | ICD-10-CM | POA: Diagnosis not present

## 2022-04-07 DIAGNOSIS — M0579 Rheumatoid arthritis with rheumatoid factor of multiple sites without organ or systems involvement: Secondary | ICD-10-CM | POA: Diagnosis not present

## 2022-04-07 DIAGNOSIS — M05711 Rheumatoid arthritis with rheumatoid factor of right shoulder without organ or systems involvement: Secondary | ICD-10-CM

## 2022-04-07 DIAGNOSIS — M5136 Other intervertebral disc degeneration, lumbar region: Secondary | ICD-10-CM | POA: Diagnosis not present

## 2022-04-07 DIAGNOSIS — Z7952 Long term (current) use of systemic steroids: Secondary | ICD-10-CM

## 2022-04-07 DIAGNOSIS — M810 Age-related osteoporosis without current pathological fracture: Secondary | ICD-10-CM | POA: Diagnosis not present

## 2022-04-07 DIAGNOSIS — R5381 Other malaise: Secondary | ICD-10-CM

## 2022-04-07 DIAGNOSIS — R7612 Nonspecific reaction to cell mediated immunity measurement of gamma interferon antigen response without active tuberculosis: Secondary | ICD-10-CM

## 2022-04-07 DIAGNOSIS — M05712 Rheumatoid arthritis with rheumatoid factor of left shoulder without organ or systems involvement: Secondary | ICD-10-CM

## 2022-04-07 DIAGNOSIS — M25512 Pain in left shoulder: Secondary | ICD-10-CM | POA: Diagnosis not present

## 2022-04-07 DIAGNOSIS — Z9181 History of falling: Secondary | ICD-10-CM | POA: Diagnosis not present

## 2022-04-07 DIAGNOSIS — Z170 Estrogen receptor positive status [ER+]: Secondary | ICD-10-CM

## 2022-04-07 DIAGNOSIS — C50212 Malignant neoplasm of upper-inner quadrant of left female breast: Secondary | ICD-10-CM

## 2022-04-07 DIAGNOSIS — Z79899 Other long term (current) drug therapy: Secondary | ICD-10-CM | POA: Diagnosis not present

## 2022-04-07 NOTE — Patient Instructions (Addendum)
Standing Labs We placed an order today for your standing lab work.   Please have your standing labs drawn in the January and every 3 months  Please have your labs drawn 2 weeks prior to your appointment so that the provider can discuss your lab results at your appointment.  Please note that you may see your imaging and lab results in [facility] before we have reviewed them. We will contact you once all results are reviewed. Please allow our office up to 72 hours to thoroughly review all of the results before contacting the office for clarification of your results.  Lab hours are:   Monday through Thursday from 8:00 am -12:30 pm and 1:00 pm-5:00 pm and Friday from 8:00 am-12:00 pm.  Please be advised, all patients with office appointments requiring lab work will take precedent over walk-in lab work.   Labs are drawn by [facility]. Please bring your co-pay at the time of your lab draw.  You may receive a bill from [facility] for your lab work.  Please note if you are on Hydroxychloroquine and and an order has been placed for a Hydroxychloroquine level, you will need to have it drawn 4 hours or more after your last dose.  If you wish to have your labs drawn at another location, please call the office 24 hours in advance so we can fax the orders.  The office is located at [address], [place], [place], [place] [address] No appointment is necessary.    If you have any questions regarding directions or hours of operation,  please call [phone].   As a reminder, please drink plenty of water prior to coming for your lab work. Thanks!   Vaccines You are taking a medication(s) that can suppress your immune system.  The following immunizations are recommended: Flu annually Covid-19  Td/Tdap (tetanus, diphtheria, pertussis) every 10 years Pneumonia (Prevnar 15 then Pneumovax 23 at least 1 year apart.  Alternatively, can take Prevnar 20 without needing additional dose) Shingrix: 2 doses from 4  weeks to 6 months apart  Please check with your PCP to make sure you are up to date.   If you have signs or symptoms of an infection or start antibiotics: First, call your PCP for workup of your infection. Hold your medication through the infection, until you complete your antibiotics, and until symptoms resolve if you take the following: Injectable medication (Actemra, Benlysta, Cimzia, Cosentyx, Enbrel, Humira, Kevzara, Orencia, Remicade, Simponi, Stelara, Taltz, Tremfya) Methotrexate Leflunomide (Arava) Mycophenolate (Cellcept) Morrie Sheldon, Olumiant, or Rinvoq

## 2022-04-08 ENCOUNTER — Other Ambulatory Visit (HOSPITAL_COMMUNITY): Payer: Self-pay

## 2022-04-08 ENCOUNTER — Other Ambulatory Visit: Payer: Self-pay

## 2022-04-08 DIAGNOSIS — H401131 Primary open-angle glaucoma, bilateral, mild stage: Secondary | ICD-10-CM | POA: Diagnosis not present

## 2022-04-08 DIAGNOSIS — Z170 Estrogen receptor positive status [ER+]: Secondary | ICD-10-CM

## 2022-04-09 ENCOUNTER — Inpatient Hospital Stay: Payer: Medicare Other | Attending: Hematology

## 2022-04-09 ENCOUNTER — Inpatient Hospital Stay: Payer: Medicare Other

## 2022-04-09 ENCOUNTER — Other Ambulatory Visit (HOSPITAL_COMMUNITY): Payer: Self-pay

## 2022-04-09 ENCOUNTER — Ambulatory Visit (HOSPITAL_COMMUNITY)
Admission: RE | Admit: 2022-04-09 | Discharge: 2022-04-09 | Disposition: A | Discharge status: Home / Self Care | Payer: Medicare Other | Source: Ambulatory Visit | Attending: Rheumatology | Admitting: Rheumatology

## 2022-04-09 ENCOUNTER — Other Ambulatory Visit: Payer: Self-pay

## 2022-04-09 VITALS — BP 156/77 | HR 99 | Temp 98.6°F | Resp 17

## 2022-04-09 DIAGNOSIS — M810 Age-related osteoporosis without current pathological fracture: Secondary | ICD-10-CM | POA: Insufficient documentation

## 2022-04-09 DIAGNOSIS — M069 Rheumatoid arthritis, unspecified: Secondary | ICD-10-CM | POA: Insufficient documentation

## 2022-04-09 DIAGNOSIS — Z170 Estrogen receptor positive status [ER+]: Secondary | ICD-10-CM | POA: Insufficient documentation

## 2022-04-09 DIAGNOSIS — D638 Anemia in other chronic diseases classified elsewhere: Secondary | ICD-10-CM | POA: Insufficient documentation

## 2022-04-09 DIAGNOSIS — R7612 Nonspecific reaction to cell mediated immunity measurement of gamma interferon antigen response without active tuberculosis: Secondary | ICD-10-CM | POA: Diagnosis present

## 2022-04-09 DIAGNOSIS — Z79899 Other long term (current) drug therapy: Secondary | ICD-10-CM | POA: Insufficient documentation

## 2022-04-09 DIAGNOSIS — C50912 Malignant neoplasm of unspecified site of left female breast: Secondary | ICD-10-CM | POA: Diagnosis not present

## 2022-04-09 DIAGNOSIS — R0602 Shortness of breath: Secondary | ICD-10-CM | POA: Diagnosis not present

## 2022-04-09 LAB — CBC WITH DIFFERENTIAL (CANCER CENTER ONLY)
Abs Immature Granulocytes: 0.01 10*3/uL (ref 0.00–0.07)
Basophils Absolute: 0.1 10*3/uL (ref 0.0–0.1)
Basophils Relative: 1 %
Eosinophils Absolute: 0.1 10*3/uL (ref 0.0–0.5)
Eosinophils Relative: 2 %
HCT: 35.5 % — ABNORMAL LOW (ref 36.0–46.0)
Hemoglobin: 11.7 g/dL — ABNORMAL LOW (ref 12.0–15.0)
Immature Granulocytes: 0 %
Lymphocytes Relative: 27 %
Lymphs Abs: 1.6 10*3/uL (ref 0.7–4.0)
MCH: 29 pg (ref 26.0–34.0)
MCHC: 33 g/dL (ref 30.0–36.0)
MCV: 87.9 fL (ref 80.0–100.0)
Monocytes Absolute: 0.7 10*3/uL (ref 0.1–1.0)
Monocytes Relative: 11 %
Neutro Abs: 3.5 10*3/uL (ref 1.7–7.7)
Neutrophils Relative %: 59 %
Platelet Count: 272 10*3/uL (ref 150–400)
RBC: 4.04 MIL/uL (ref 3.87–5.11)
RDW: 16.4 % — ABNORMAL HIGH (ref 11.5–15.5)
WBC Count: 6 10*3/uL (ref 4.0–10.5)
nRBC: 0 % (ref 0.0–0.2)

## 2022-04-09 LAB — CMP (CANCER CENTER ONLY)
ALT: 26 U/L (ref 0–44)
AST: 26 U/L (ref 15–41)
Albumin: 4.1 g/dL (ref 3.5–5.0)
Alkaline Phosphatase: 49 U/L (ref 38–126)
Anion gap: 8 (ref 5–15)
BUN: 11 mg/dL (ref 8–23)
CO2: 27 mmol/L (ref 22–32)
Calcium: 9.4 mg/dL (ref 8.9–10.3)
Chloride: 102 mmol/L (ref 98–111)
Creatinine: 0.6 mg/dL (ref 0.44–1.00)
GFR, Estimated: >60 mL/min (ref >60)
Glucose, Bld: 89 mg/dL (ref 70–99)
Potassium: 4.3 mmol/L (ref 3.5–5.1)
Sodium: 137 mmol/L (ref 135–145)
Total Bilirubin: 0.4 mg/dL (ref 0.3–1.2)
Total Protein: 7.1 g/dL (ref 6.5–8.1)

## 2022-04-09 LAB — IRON AND IRON BINDING CAPACITY (CC-WL,HP ONLY)
Iron: 102 ug/dL (ref 28–170)
Saturation Ratios: 33 % — ABNORMAL HIGH (ref 10.4–31.8)
TIBC: 305 ug/dL (ref 250–450)
UIBC: 203 ug/dL (ref 148–442)

## 2022-04-09 LAB — FERRITIN: Ferritin: 369 ng/mL — ABNORMAL HIGH (ref 11–307)

## 2022-04-09 MED ORDER — DENOSUMAB 60 MG/ML ~~LOC~~ SOSY
60.0000 mg | PREFILLED_SYRINGE | Freq: Once | SUBCUTANEOUS | Status: AC
  Administered 2022-04-09: 60 mg via SUBCUTANEOUS
  Filled 2022-04-09: qty 1

## 2022-04-12 ENCOUNTER — Ambulatory Visit
Admission: RE | Admit: 2022-04-12 | Discharge: 2022-04-12 | Disposition: A | Discharge status: Home / Self Care | Payer: Medicare Other | Source: Ambulatory Visit | Attending: Hematology | Admitting: Hematology

## 2022-04-12 ENCOUNTER — Other Ambulatory Visit: Payer: Self-pay

## 2022-04-12 ENCOUNTER — Other Ambulatory Visit (HOSPITAL_COMMUNITY): Payer: Self-pay

## 2022-04-12 ENCOUNTER — Inpatient Hospital Stay: Payer: Medicare Other

## 2022-04-12 ENCOUNTER — Inpatient Hospital Stay (HOSPITAL_BASED_OUTPATIENT_CLINIC_OR_DEPARTMENT_OTHER): Payer: Medicare Other | Admitting: Hematology

## 2022-04-12 VITALS — BP 136/84 | HR 97 | Temp 97.6°F | Resp 16 | Wt 128.1 lb

## 2022-04-12 DIAGNOSIS — M810 Age-related osteoporosis without current pathological fracture: Secondary | ICD-10-CM | POA: Diagnosis not present

## 2022-04-12 DIAGNOSIS — C50912 Malignant neoplasm of unspecified site of left female breast: Secondary | ICD-10-CM | POA: Diagnosis not present

## 2022-04-12 DIAGNOSIS — D509 Iron deficiency anemia, unspecified: Secondary | ICD-10-CM | POA: Diagnosis not present

## 2022-04-12 DIAGNOSIS — Z170 Estrogen receptor positive status [ER+]: Secondary | ICD-10-CM

## 2022-04-12 DIAGNOSIS — C50312 Malignant neoplasm of lower-inner quadrant of left female breast: Secondary | ICD-10-CM | POA: Diagnosis not present

## 2022-04-12 DIAGNOSIS — R0602 Shortness of breath: Secondary | ICD-10-CM

## 2022-04-12 DIAGNOSIS — D638 Anemia in other chronic diseases classified elsewhere: Secondary | ICD-10-CM | POA: Diagnosis not present

## 2022-04-12 DIAGNOSIS — M069 Rheumatoid arthritis, unspecified: Secondary | ICD-10-CM | POA: Diagnosis not present

## 2022-04-12 DIAGNOSIS — Z1231 Encounter for screening mammogram for malignant neoplasm of breast: Secondary | ICD-10-CM | POA: Diagnosis not present

## 2022-04-12 NOTE — Telephone Encounter (Signed)
Based on [facility] dispense history, Orencia was filled for 28 days supply as well as 56 days supply. Closing encounter  Knox Saliva, PharmD, MPH, BCPS, CPP Clinical Pharmacist (Rheumatology and Pulmonology)

## 2022-04-12 NOTE — Progress Notes (Addendum)
HEMATOLOGY ONCOLOGY CLINIC NOTE  Date of service: 04/12/22   Patient Care Team: [name], MD as PCP - General (Hematology)  Chief Complaint:  F/u for ER/PR positive Her neg Breast cancer  Diagnosis   Stage I (pT1b, pN0, Mx) left sided invasive ductal carcinoma of the breast. 1 cm in size with lymphovascular invasion grade 2/3, Ki-67 15% ER/PR positive HER-2/neu negative. One Sentinel lymph node biopsy negative No DCIS. Severe calcified benign lesions in left breast with PASH (pseudoangiomatous stromal hyperplasia).  Patient is status post left breast lumpectomy by Dr. Ninfa Linden on 07/09/2016.   S/p adjuvant  Left breast RT on 10/05/2016  INTERVAL HISTORY: Ms.Lemaire is here for follow-up of her breast cancer.   Patient was last seen by me on 08/26/2021 and was doing well without any new medical concerns.   Patient is here with her friend during today's visit. She reports she had shoulder surgery 12/10/2021 and complains of shortness of breath and fatigue after her surgery. She notes that her shortness of breath is due to her asthma. She is taking steroid and has a nebulizer for her asthma.  She notes that her breathing is slightly getting better now when she uses her nebulizer, but still feels tiredness/fatigue. She has been getting physical therapy since July 2023.   She denies any joint swelling, but notes that she reports of not having strength in her hands after her shoulder surgery. However, she denies neck pain.   She is using her walker everyday for her balance. Patient reports she had a recent fall at night where she was not using her walker.   She is traveling to [facility] on 04/19/2022 to around March 2024. She does not have a Physician in [place].   She denies any new lumps/bumps, abdominal pain, fever, chills, night sweats, breast pain, and leg swelling. She complains of shortness of breath while seating during today' visit.    REVIEW OF SYSTEMS:   10  Point review of Systems was done is negative except as noted above. . Past Medical History:  Diagnosis Date   Anemia    Arthritis    rheumatoid +CCP neg RF +ANA synovitis    Asthma    Breast cancer ([facility]) 2018   Left Breast Cancer   Cancer ([facility])    Breast cancer   Glaucoma    right eye   Hypertension    Osteoporosis    Personal history of radiation therapy 09/2016   Left Breast Cancer     Past Surgical History:  Procedure Laterality Date   BREAST BIOPSY Left 05/14/2016   x2   BREAST BIOPSY Right 05/14/2016   BREAST LUMPECTOMY Left 07/09/2016   BREAST LUMPECTOMY WITH RADIOACTIVE SEED AND SENTINEL LYMPH NODE BIOPSY Left 07/09/2016   Procedure: BREAST LUMPECTOMY WITH RADIOACTIVE SEED AND SENTINEL LYMPH NODE BIOPSY;  Surgeon: Coralie Keens, MD;  Location: [facility];  Service: General;  Laterality: Left;   GLAUCOMA SURGERY     NO PAST SURGERIES     REVERSE SHOULDER ARTHROPLASTY Right 12/10/2021   Procedure: REVERSE SHOULDER ARTHROPLASTY;  Surgeon: Justice Britain, MD;  [facility];  Service: Orthopedics;  Laterality: Right;   SHOULDER SURGERY Right 11/2021   TOTAL HIP ARTHROPLASTY Left 10/01/2014   Procedure: TOTAL HIP ARTHROPLASTY;  Surgeon: Garald Balding, MD;  Location: [facility];  Service: Orthopedics;  Laterality: Left;    Social History   Tobacco Use   Smoking status: Never    Passive exposure: Past   Smokeless  tobacco: Never  Vaping Use   Vaping Use: Never used  Substance Use Topics   Alcohol use: No   Drug use: Never    ALLERGIES:  is allergic to penicillins and sulfa antibiotics.  MEDICATIONS:  Current Outpatient Medications  Medication Sig Dispense Refill   Abatacept (ORENCIA) 125 MG/ML SOSY INJECT 125 MG INTO THE SKIN ONCE A WEEK. 12 mL 0   acetaminophen (TYLENOL) 650 MG CR tablet Take 1,300 mg by mouth every 8 (eight) hours as needed for pain.     albuterol (PROVENTIL HFA;VENTOLIN HFA) 108 (90 Base) MCG/ACT inhaler Inhale 1-2 puffs into the lungs 2  (two) times daily as needed for wheezing or shortness of breath.     albuterol (PROVENTIL) (2.5 MG/3ML) 0.083% nebulizer solution      amLODipine (NORVASC) 5 MG tablet Take 5 mg by mouth daily.      Cholecalciferol (VITAMIN D3) 5000 units TABS Take 5,000 Units by mouth daily after breakfast. With K2     denosumab (PROLIA) 60 MG/ML SOSY injection Inject 60 mg into the skin every 6 (six) months.     leflunomide (ARAVA) 20 MG tablet TAKE 1 TABLET BY MOUTH EVERY DAY 90 tablet 0   montelukast (SINGULAIR) 10 MG tablet Take 10 mg by mouth daily.     Multiple Vitamin (MULTIVITAMIN WITH MINERALS) TABS tablet Take 1 tablet by mouth daily.     Omega-3 Fatty Acids (OMEGA-3 FISH OIL PO) Take 1 capsule by mouth daily. vegetarian     ondansetron (ZOFRAN) 4 MG tablet Take 1 tablet (4 mg total) by mouth every 8 (eight) hours as needed for nausea or vomiting. (Patient not taking: Reported on 04/07/2022) 10 tablet 0   oxyCODONE-acetaminophen (PERCOCET) 5-325 MG tablet Take 1 tablet by mouth every 4 (four) hours as needed for severe pain (max 6 q). (Patient not taking: Reported on 04/07/2022) 8 tablet 0   Polyethyl Glycol-Propyl Glycol (SYSTANE ULTRA OP) Place 1 drop into both eyes daily.     predniSONE (DELTASONE) 5 MG tablet TAKE 1 TABLET BY MOUTH EVERY DAY WITH BREAKFAST 90 tablet 0   Pyridoxine HCl (VITAMIN B-6 PO) Take 1 tablet by mouth daily.     raloxifene (EVISTA) 60 MG tablet Take 1 tablet (60 mg total) by mouth daily. (Patient not taking: Reported on 11/25/2021) 90 tablet 3   traMADol (ULTRAM) 50 MG tablet Take 1 tablet (50 mg total) by mouth every 6 (six) hours as needed for moderate pain. (Patient not taking: Reported on 04/07/2022) 20 tablet 0   TURMERIC PO Take 1 capsule by mouth daily.     vitamin B-12 (CYANOCOBALAMIN) 500 MCG tablet Take 500 mcg by mouth daily.     No current facility-administered medications for this visit.    PHYSICAL EXAMINATION: ECOG PERFORMANCE STATUS: 1 - Symptomatic but  completely ambulatory  Vitals:   04/12/22 1346  BP: 136/84  Pulse: 97  Resp: 16  Temp: 97.6 F (36.4 C)  SpO2: 97%     Filed Weights   04/12/22 1346  Weight: 128 lb 1.6 oz (58.1 kg)    Body mass index is 25.02 kg/m.   NAD GENERAL:alert, in no acute distress and comfortable SKIN: no acute rashes, no significant lesions EYES: conjunctiva are pink and non-injected, sclera anicteric OROPHARYNX: MMM, no exudates, no oropharyngeal erythema or ulceration NECK: supple, no JVD LYMPH:  no palpable lymphadenopathy in the cervical, axillary or inguinal regions LUNGS: clear to auscultation b/l with normal respiratory effort HEART: regular rate & rhythm  ABDOMEN:  normoactive bowel sounds , non tender, not distended. Extremity: no pedal edema PSYCH: alert & oriented x 3 with fluent speech NEURO: no focal motor/sensory deficits     LABORATORY DATA:   I have reviewed the data as listed  .    Latest Ref Rng & Units 04/09/2022    1:05 PM 12/02/2021    2:31 PM 10/07/2021    2:42 PM  CBC  WBC 4.0 - 10.5 K/uL 6.0  6.5  6.6   Hemoglobin 12.0 - 15.0 g/dL 11.7  11.8  11.2   Hematocrit 36.0 - 46.0 % 35.5  37.4  34.4   Platelets 150 - 400 K/uL 272  304  301    .    Latest Ref Rng & Units 04/09/2022    1:05 PM 12/02/2021    2:31 PM 10/07/2021    2:42 PM  CMP  Glucose 70 - 99 mg/dL 89  126  161   BUN 8 - 23 mg/dL _0 Creatinine 0.44 - 1.00 mg/dL 0.60  0.81  0.75   Sodium 135 - 145 mmol/L 137  137  137   Potassium 3.5 - 5.1 mmol/L 4.3  4.8  4.1   Chloride 98 - 111 mmol/L 102  101  101   CO2 22 - 32 mmol/L _1 Calcium 8.9 - 10.3 mg/dL 9.4  9.6  9.1   Total Protein 6.5 - 8.1 g/dL 7.1   6.7   Total Bilirubin 0.3 - 1.2 mg/dL 0.4   0.5   Alkaline Phos 38 - 126 U/L 49   51   AST 15 - 41 U/L 26   31   ALT 0 - 44 U/L _2 OH vit D - 54.7---> 49.2  . Lab Results  Component Value Date   IRON 102 04/09/2022   TIBC 305 04/09/2022   IRONPCTSAT 33 (H)  04/09/2022   (Iron and TIBC)  Lab Results  Component Value Date   FERRITIN 369 (H) 04/09/2022   B12- 686        Microscopic Comment 1. BREAST, INVASIVE TUMOR Procedure: Seed localized lumpectomy. Laterality: Left breast. Tumor Size (gross measurement): 1.0 cm Histologic Type: Ductal Grade: II Tubular Differentiation: 3 Nuclear Pleomorphism: 2 Mitotic Count: 1 Ductal Carcinoma in Situ (DCIS): Not identified Extent of Tumor: Confined to breast parenchyma Margins: Invasive carcinoma, distance from closest margin: Focally 0.1 cm to the anterior margin Regional Lymph Nodes: Number of Lymph Nodes Examined: 1 Number of Sentinel Lymph Nodes Examined: 1 Lymph Nodes with Macrometastases: 0 Lymph Nodes with Micrometastases: 0 Lymph Nodes with Isolated Tumor Cells: 0 Breast Prognostic Profile: [id number] Estrogen Receptor: 100%, strong Progesterone Receptor: 5%, strong Her2: No amplification was detected. The ratio was 1.27 1 of 3 FINAL for ALANTIS, BETHUNE B [id number]) Microscopic Comment(continued) Ki-67: 15% Pathologic Stage Classification (pTNM, AJCC 8th Edition): Primary Tumor (pT): pT1b Regional Lymph Nodes (pN): pN0 (JBK:ecj 07/12/2016) Enid Cutter MD Pathologist, Electronic Signature (Case signed 07/12/2016)     CLINICAL DATA:  Followup for probably benign right breast mass and probably benign left breast calcifications.   EXAM: 2D DIGITAL DIAGNOSTIC BILATERAL MAMMOGRAM WITH CAD AND ADJUNCT TOMO   BILATERAL BREAST ULTRASOUND   COMPARISON:  Previous exam(s).   ACR Breast Density Category c: The breast tissue is heterogeneously dense, which may obscure small masses.   FINDINGS: No suspicious masses or calcifications are seen in the right  breast. The oval circumscribed mass in the upper-outer posterior right breast appears unchanged. Spot compression magnification views were performed over the inner left breast demonstrating unchanged 3 mm group of  punctate calcifications. There is a mass with irregular margins in the lower inner left breast measuring approximately 7-8 mm.   Mammographic images were processed with CAD.   Physical examination of the left breast reveals a firm nodule at the 8 o'clock position.   Targeted ultrasound of the left breast was performed demonstrating an irregular hypoechoic mass at 8 o'clock 2 cm from nipple measuring 0.8 x 0.6 x 0.7 cm. This corresponds well with the mass seen at mammography. No lymphadenopathy seen in the left axilla.   Targeted ultrasound of the right breast was performed demonstrating a mixed echogenicity mass at 10 o'clock 8 cm from nipple measuring 1.4 x 0.6 x 1.1 cm. Although this may represent an area of fat necrosis is indeterminate. No lymphadenopathy seen in the right axilla.   IMPRESSION: 1. Suspicious mass in the left breast at the 8 o'clock position.   2. Stable appearance of punctate calcifications in the lower inner left breast.   3. Indeterminate mass in the right breast at 10 o'clock 8 cm from the nipple which cannot be clearly characterized as an area of fat necrosis.   RECOMMENDATION: 1. Recommend ultrasound-guided biopsy of the mass in the left breast at the 8 o'clock position.   2. Recommend stereotactic guided biopsy of the calcifications in the lower inner left breast (given the suspicious mass seen in close proximity to the calcifications).   3. Recommend ultrasound-guided biopsy of the mass in the right breast at 10 o'clock position.   Biopsies are being scheduled for the patient.   I have discussed the findings and recommendations with the patient. Results were also provided in writing at the conclusion of the visit. If applicable, a reminder letter will be sent to the patient regarding the next appointment.   BI-RADS CATEGORY  4: Suspicious.     Electronically Signed   By: Everlean Alstrom M.D.   On: 05/12/2016 13:56   RADIOGRAPHIC  STUDIES: I have personally reviewed the radiological images as listed and agreed with the findings in the report. No results found.  ASSESSMENT & PLAN:   81 y.o.  female with  #1 Stage I (pT1b, pN0, Mx) left sided invasive ductal carcinoma of the breast. 1 cm in size with lymphovascular invasion grade 2/3, Ki-67 15% ER/PR positive HER-2/neu negative. One Sentinel lymph node biopsy negative No DCIS. Severe calcified benign lesions in left breast with PASH (pseudoangiomatous stromal hyperplasia).  Patient had left breast lumpectomy by Dr. Ninfa Linden on 07/09/2016.  She completed her adjuvant radiation therapy on 09/2016  Has been on Raloxifene since then for adjuvant endocrine therapy. (AI not considered due to severe osteoporosis with fractures)  05/26/18 MM revealed No mammographic evidence of malignancy in either breast. 2. Stable left breast posttreatment changes.  #2 Severe osteoporosis - multiple risk factors including chronic steroid therapy, age, rheumatoid arthritis. Her bone density study shows significant osteoporosis despite being on Fosamax for 2 years. Last vitamin D levels are adequate at 50 She has had an osteoporotic left hip fracture around May 2016.  #3 Rheumatoid arthritis was on Orencia per rheumatology - currently off due to LTBI status Plan Continue follow-up with Dr. Estanislado Pandy for management of RA   #4 Anemia of chronic disease due to chronic inflammation from RA  #5  History of +ve TB quantiferon test  Plan: -Reviewed lab results from 04/09/2022 with the patient and her friend. Labs showed WBC of 6.0 K, Hemoglobin of 11.7 K, and platelets of 272. Stable CMP. -No indication for additional IV iron replacement at this time. -Will obtain chest CTA scan for her Shortness of Breath to r/o PE -Patient will get an Mammogram after today's visit.  -Recommended following up with PCP for Shortness of Breath.  -Recommended getting an RSV vaccine.    FOLLOW UP: CTA  chest to r/o PE ASAP Phone visit with Dr Irene Limbo in 1 week  The total time spent in the appointment was 30 minutes* .  All of the patient's questions were answered with apparent satisfaction. The patient knows to call the clinic with any problems, questions or concerns.   Zettie Cooley, am acting as a scribe for Sullivan Lone, MD.  Sullivan Lone MD [place] AAHIVMS [facility] Hematology/Oncology Physician [facility]  .*Total Encounter Time as defined by the Centers for Medicare and Medicaid Services includes, in addition to the face-to-face time of a patient visit (documented in the note above) non-face-to-face time: obtaining and reviewing outside history, ordering and reviewing medications, tests or procedures, care coordination (communications with other health care professionals or caregivers) and documentation in the medical record.

## 2022-04-14 ENCOUNTER — Ambulatory Visit: Payer: Medicare Other | Admitting: Rheumatology

## 2022-04-14 ENCOUNTER — Ambulatory Visit (HOSPITAL_COMMUNITY)
Admission: RE | Admit: 2022-04-14 | Discharge: 2022-04-14 | Disposition: A | Discharge status: Home / Self Care | Payer: Medicare Other | Source: Ambulatory Visit | Attending: Hematology | Admitting: Hematology

## 2022-04-14 DIAGNOSIS — Z170 Estrogen receptor positive status [ER+]: Secondary | ICD-10-CM | POA: Diagnosis not present

## 2022-04-14 DIAGNOSIS — M25611 Stiffness of right shoulder, not elsewhere classified: Secondary | ICD-10-CM | POA: Diagnosis not present

## 2022-04-14 DIAGNOSIS — C50312 Malignant neoplasm of lower-inner quadrant of left female breast: Secondary | ICD-10-CM | POA: Diagnosis not present

## 2022-04-14 DIAGNOSIS — R0602 Shortness of breath: Secondary | ICD-10-CM | POA: Diagnosis not present

## 2022-04-14 DIAGNOSIS — Z853 Personal history of malignant neoplasm of breast: Secondary | ICD-10-CM | POA: Diagnosis not present

## 2022-04-14 MED ORDER — IOHEXOL 350 MG/ML SOLN
75.0000 mL | Freq: Once | INTRAVENOUS | Status: AC | PRN
  Administered 2022-04-14: 75 mL via INTRAVENOUS

## 2022-04-14 MED ORDER — SODIUM CHLORIDE (PF) 0.9 % IJ SOLN
INTRAMUSCULAR | Status: AC
  Filled 2022-04-14: qty 50

## 2022-04-16 ENCOUNTER — Inpatient Hospital Stay (HOSPITAL_BASED_OUTPATIENT_CLINIC_OR_DEPARTMENT_OTHER): Payer: Medicare Other | Admitting: Hematology

## 2022-04-16 DIAGNOSIS — C50312 Malignant neoplasm of lower-inner quadrant of left female breast: Secondary | ICD-10-CM

## 2022-04-16 DIAGNOSIS — R0602 Shortness of breath: Secondary | ICD-10-CM

## 2022-04-16 DIAGNOSIS — Z170 Estrogen receptor positive status [ER+]: Secondary | ICD-10-CM

## 2022-04-18 ENCOUNTER — Encounter: Payer: Self-pay | Admitting: Hematology

## 2022-04-19 ENCOUNTER — Telehealth: Payer: Medicare Other | Admitting: Hematology

## 2022-04-21 DIAGNOSIS — J45909 Unspecified asthma, uncomplicated: Secondary | ICD-10-CM | POA: Diagnosis not present

## 2022-04-21 DIAGNOSIS — M1990 Unspecified osteoarthritis, unspecified site: Secondary | ICD-10-CM | POA: Diagnosis not present

## 2022-04-21 DIAGNOSIS — S5011XA Contusion of right forearm, initial encounter: Secondary | ICD-10-CM | POA: Diagnosis not present

## 2022-04-21 DIAGNOSIS — S43401A Unspecified sprain of right shoulder joint, initial encounter: Secondary | ICD-10-CM | POA: Diagnosis not present

## 2022-04-22 ENCOUNTER — Encounter: Payer: Self-pay | Admitting: Hematology

## 2022-04-22 NOTE — Progress Notes (Signed)
HEMATOLOGY ONCOLOGY CLINIC NOTE  Date of service: 04/16/2022   Patient Care Team: [name], MD as PCP - General (Hematology)  Chief Complaint:  F/u for ER/PR positive Her neg Breast cancer discussion of mammogram and CTA chest results Diagnosis   Stage I (pT1b, pN0, Mx) left sided invasive ductal carcinoma of the breast. 1 cm in size with lymphovascular invasion grade 2/3, Ki-67 15% ER/PR positive HER-2/neu negative. One Sentinel lymph node biopsy negative No DCIS. Severe calcified benign lesions in left breast with PASH (pseudoangiomatous stromal hyperplasia).  Patient is status post left breast lumpectomy by Dr. Ninfa Linden on 07/09/2016.   S/p adjuvant  Left breast RT on 10/05/2016  INTERVAL HISTORY:  .Marland KitchenI connected with Felicia Gonzalez on 04/16/2022 at  8:40 AM EST by telephone visit and verified that I am speaking with the correct person using two identifiers.   I discussed the limitations, risks, security and privacy concerns of performing an evaluation and management service by telemedicine and the availability of in-person appointments. I also discussed with the patient that there may be a patient responsible charge related to this service. The patient expressed understanding and agreed to proceed.   Other persons participating in the visit and their role in the encounter: none   Patient's location: home  Provider's location: [facility]   Chief Complaint: Discussion of CTA of the chest and mammogram.  Patient notes no acute new symptoms since her last clinic visit.  CTA of the chest showed no evidence of pulmonary embolism.  No acute intrathoracic process to explain shortness of breath.  30 mammogram done on 04/12/2022 showed no mammographic evidence of malignancy.   REVIEW OF SYSTEMS:   No acute shortness of breath at rest currently.  No chest pain.  No fevers no chills no night sweats.  No palpable breast lumps noted by the patient. . Past Medical History:   Diagnosis Date   Anemia    Arthritis    rheumatoid +CCP neg RF +ANA synovitis    Asthma    Breast cancer ([place]) 2018   Left Breast Cancer   Cancer ([place])    Breast cancer   Glaucoma    right eye   Hypertension    Osteoporosis    Personal history of radiation therapy 09/2016   Left Breast Cancer     Past Surgical History:  Procedure Laterality Date   BREAST BIOPSY Left 05/14/2016   x2   BREAST BIOPSY Right 05/14/2016   BREAST LUMPECTOMY Left 07/09/2016   BREAST LUMPECTOMY WITH RADIOACTIVE SEED AND SENTINEL LYMPH NODE BIOPSY Left 07/09/2016   Procedure: BREAST LUMPECTOMY WITH RADIOACTIVE SEED AND SENTINEL LYMPH NODE BIOPSY;  Surgeon: Coralie Keens, MD;  Location: [place];  Service: General;  Laterality: Left;   GLAUCOMA SURGERY     NO PAST SURGERIES     REVERSE SHOULDER ARTHROPLASTY Right 12/10/2021   Procedure: REVERSE SHOULDER ARTHROPLASTY;  Surgeon: Justice Britain, MD;  Location: [facility];  Service: Orthopedics;  Laterality: Right;   SHOULDER SURGERY Right 11/2021   TOTAL HIP ARTHROPLASTY Left 10/01/2014   Procedure: TOTAL HIP ARTHROPLASTY;  Surgeon: Garald Balding, MD;  Location: [place];  Service: Orthopedics;  Laterality: Left;    Social History   Tobacco Use   Smoking status: Never    Passive exposure: Past   Smokeless tobacco: Never  Vaping Use   Vaping Use: Never used  Substance Use Topics   Alcohol use: No   Drug use: Never    ALLERGIES:  is allergic to penicillins and sulfa antibiotics.  MEDICATIONS:  Current Outpatient Medications  Medication Sig Dispense Refill   Abatacept (ORENCIA) 125 MG/ML SOSY INJECT 125 MG INTO THE SKIN ONCE A WEEK. 12 mL 0   acetaminophen (TYLENOL) 650 MG CR tablet Take 1,300 mg by mouth every 8 (eight) hours as needed for pain.     albuterol (PROVENTIL HFA;VENTOLIN HFA) 108 (90 Base) MCG/ACT inhaler Inhale 1-2 puffs into the lungs 2 (two) times daily as needed for wheezing or shortness of breath.     albuterol (PROVENTIL) (2.5  MG/3ML) 0.083% nebulizer solution      amLODipine (NORVASC) 5 MG tablet Take 5 mg by mouth daily.      Cholecalciferol (VITAMIN D3) 5000 units TABS Take 5,000 Units by mouth daily after breakfast. With K2     denosumab (PROLIA) 60 MG/ML SOSY injection Inject 60 mg into the skin every 6 (six) months.     leflunomide (ARAVA) 20 MG tablet TAKE 1 TABLET BY MOUTH EVERY DAY 90 tablet 0   montelukast (SINGULAIR) 10 MG tablet Take 10 mg by mouth daily.     Multiple Vitamin (MULTIVITAMIN WITH MINERALS) TABS tablet Take 1 tablet by mouth daily.     Omega-3 Fatty Acids (OMEGA-3 FISH OIL PO) Take 1 capsule by mouth daily. vegetarian     ondansetron (ZOFRAN) 4 MG tablet Take 1 tablet (4 mg total) by mouth every 8 (eight) hours as needed for nausea or vomiting. (Patient not taking: Reported on 04/07/2022) 10 tablet 0   oxyCODONE-acetaminophen (PERCOCET) 5-325 MG tablet Take 1 tablet by mouth every 4 (four) hours as needed for severe pain (max 6 q). (Patient not taking: Reported on 04/07/2022) 8 tablet 0   Polyethyl Glycol-Propyl Glycol (SYSTANE ULTRA OP) Place 1 drop into both eyes daily.     predniSONE (DELTASONE) 5 MG tablet TAKE 1 TABLET BY MOUTH EVERY DAY WITH BREAKFAST 90 tablet 0   Pyridoxine HCl (VITAMIN Gonzalez-6 PO) Take 1 tablet by mouth daily.     raloxifene (EVISTA) 60 MG tablet Take 1 tablet (60 mg total) by mouth daily. (Patient not taking: Reported on 11/25/2021) 90 tablet 3   traMADol (ULTRAM) 50 MG tablet Take 1 tablet (50 mg total) by mouth every 6 (six) hours as needed for moderate pain. (Patient not taking: Reported on 04/07/2022) 20 tablet 0   TURMERIC PO Take 1 capsule by mouth daily.     vitamin Gonzalez-12 (CYANOCOBALAMIN) 500 MCG tablet Take 500 mcg by mouth daily.     No current facility-administered medications for this visit.    PHYSICAL EXAMINATION: Telemedicine visit    LABORATORY DATA:   I have reviewed the data as listed  .    Latest Ref Rng & Units 04/09/2022    1:05 PM 12/02/2021     2:31 PM 10/07/2021    2:42 PM  CBC  WBC 4.0 - 10.5 K/uL 6.0  6.5  6.6   Hemoglobin 12.0 - 15.0 g/dL 11.7  11.8  11.2   Hematocrit 36.0 - 46.0 % 35.5  37.4  34.4   Platelets 150 - 400 K/uL 272  304  301    .    Latest Ref Rng & Units 04/09/2022    1:05 PM 12/02/2021    2:31 PM 10/07/2021    2:42 PM  CMP  Glucose 70 - 99 mg/dL 89  126  161   BUN 8 - 23 mg/dL _0 Creatinine 0.44 - 1.00  mg/dL 0.60  0.81  0.75   Sodium 135 - 145 mmol/L 137  137  137   Potassium 3.5 - 5.1 mmol/L 4.3  4.8  4.1   Chloride 98 - 111 mmol/L 102  101  101   CO2 22 - 32 mmol/L _0 Calcium 8.9 - 10.3 mg/dL 9.4  9.6  9.1   Total Protein 6.5 - 8.1 g/dL 7.1   6.7   Total Bilirubin 0.3 - 1.2 mg/dL 0.4   0.5   Alkaline Phos 38 - 126 U/L 49   51   AST 15 - 41 U/L 26   31   ALT 0 - 44 U/L _1 OH vit D - 54.7---> 49.2  . Lab Results  Component Value Date   IRON 102 04/09/2022   TIBC 305 04/09/2022   IRONPCTSAT 33 (H) 04/09/2022   (Iron and TIBC)  Lab Results  Component Value Date   FERRITIN 369 (H) 04/09/2022   B12- 686        Microscopic Comment 1. BREAST, INVASIVE TUMOR Procedure: Seed localized lumpectomy. Laterality: Left breast. Tumor Size (gross measurement): 1.0 cm Histologic Type: Ductal Grade: II Tubular Differentiation: 3 Nuclear Pleomorphism: 2 Mitotic Count: 1 Ductal Carcinoma in Situ (DCIS): Not identified Extent of Tumor: Confined to breast parenchyma Margins: Invasive carcinoma, distance from closest margin: Focally 0.1 cm to the anterior margin Regional Lymph Nodes: Number of Lymph Nodes Examined: 1 Number of Sentinel Lymph Nodes Examined: 1 Lymph Nodes with Macrometastases: 0 Lymph Nodes with Micrometastases: 0 Lymph Nodes with Isolated Tumor Cells: 0 Breast Prognostic Profile: [id number] Estrogen Receptor: 100%, strong Progesterone Receptor: 5%, strong Her2: No amplification was detected. The ratio was 1.27 1 of 3 FINAL for Felicia Gonzalez, Felicia Gonzalez [phone]) Microscopic Comment(continued) Ki-67: 15% Pathologic Stage Classification (pTNM, AJCC 8th Edition): Primary Tumor (pT): pT1b Regional Lymph Nodes (pN): pN0 (JBK:ecj 07/12/2016) Enid Cutter MD Pathologist, Electronic Signature (Case signed 07/12/2016)     CLINICAL DATA:  Followup for probably benign right breast mass and probably benign left breast calcifications.   EXAM: 2D DIGITAL DIAGNOSTIC BILATERAL MAMMOGRAM WITH CAD AND ADJUNCT TOMO   BILATERAL BREAST ULTRASOUND   COMPARISON:  Previous exam(s).   ACR Breast Density Category c: The breast tissue is heterogeneously dense, which may obscure small masses.   FINDINGS: No suspicious masses or calcifications are seen in the right breast. The oval circumscribed mass in the upper-outer posterior right breast appears unchanged. Spot compression magnification views were performed over the inner left breast demonstrating unchanged 3 mm group of punctate calcifications. There is a mass with irregular margins in the lower inner left breast measuring approximately 7-8 mm.   Mammographic images were processed with CAD.   Physical examination of the left breast reveals a firm nodule at the 8 o'clock position.   Targeted ultrasound of the left breast was performed demonstrating an irregular hypoechoic mass at 8 o'clock 2 cm from nipple measuring 0.8 x 0.6 x 0.7 cm. This corresponds well with the mass seen at mammography. No lymphadenopathy seen in the left axilla.   Targeted ultrasound of the right breast was performed demonstrating a mixed echogenicity mass at 10 o'clock 8 cm from nipple measuring 1.4 x 0.6 x 1.1 cm. Although this may represent an area of fat necrosis is indeterminate. No lymphadenopathy seen in the right axilla.   IMPRESSION: 1. Suspicious mass in the left breast at the 8 o'clock position.  2. Stable appearance of punctate calcifications in the lower inner left breast.   3.  Indeterminate mass in the right breast at 10 o'clock 8 cm from the nipple which cannot be clearly characterized as an area of fat necrosis.   RECOMMENDATION: 1. Recommend ultrasound-guided biopsy of the mass in the left breast at the 8 o'clock position.   2. Recommend stereotactic guided biopsy of the calcifications in the lower inner left breast (given the suspicious mass seen in close proximity to the calcifications).   3. Recommend ultrasound-guided biopsy of the mass in the right breast at 10 o'clock position.   Biopsies are being scheduled for the patient.   I have discussed the findings and recommendations with the patient. Results were also provided in writing at the conclusion of the visit. If applicable, a reminder letter will be sent to the patient regarding the next appointment.   BI-RADS CATEGORY  4: Suspicious.     Electronically Signed   By: Everlean Alstrom M.D.   On: 05/12/2016 13:56   RADIOGRAPHIC STUDIES: I have personally reviewed the radiological images as listed and agreed with the findings in the report. CT Angio Chest Pulmonary Embolism (PE) W or WO Contrast  Result Date: 04/14/2022 CLINICAL DATA:  Short of breath, history of breast cancer EXAM: CT ANGIOGRAPHY CHEST WITH CONTRAST TECHNIQUE: Multidetector CT imaging of the chest was performed using the standard protocol during bolus administration of intravenous contrast. Multiplanar CT image reconstructions and MIPs were obtained to evaluate the vascular anatomy. RADIATION DOSE REDUCTION: This exam was performed according to the departmental dose-optimization program which includes automated exposure control, adjustment of the mA and/or kV according to patient size and/or use of iterative reconstruction technique. CONTRAST:  63m OMNIPAQUE IOHEXOL 350 MG/ML SOLN COMPARISON:  04/09/2022 FINDINGS: Cardiovascular: This is a technically adequate evaluation of the pulmonary vasculature. No filling defects or  pulmonary emboli. The heart is unremarkable without pericardial effusion. Ectasia of the thoracic aorta without underlying aneurysm or dissection. Atherosclerosis of the aortic arch. Mediastinum/Nodes: No enlarged mediastinal, hilar, or axillary lymph nodes. Thyroid gland, trachea, and esophagus demonstrate no significant findings. Lungs/Pleura: No acute airspace disease, effusion, or pneumothorax. Central airways are patent. Upper Abdomen: No acute abnormality. Musculoskeletal: Right shoulder arthroplasty. Regions of sclerosis within the left anterior fifth through seventh ribs likely represent prior healed trauma given linear orientation. No acute or destructive bony abnormalities. Reconstructed images demonstrate no additional findings. Review of the MIP images confirms the above findings. IMPRESSION: 1. No evidence of pulmonary embolus. 2. No acute intrathoracic process. 3.  Aortic Atherosclerosis (ICD10-I70.0). Electronically Signed   By: MRanda NgoM.D.   On: 04/14/2022 18:40   MM 3D SCREEN BREAST BILATERAL  Result Date: 04/14/2022 CLINICAL DATA:  Screening. EXAM: DIGITAL SCREENING BILATERAL MAMMOGRAM WITH TOMOSYNTHESIS AND CAD TECHNIQUE: Bilateral screening digital craniocaudal and mediolateral oblique mammograms were obtained. Bilateral screening digital breast tomosynthesis was performed. The images were evaluated with computer-aided detection. COMPARISON:  Previous exam(s). ACR Breast Density Category c: The breast tissue is heterogeneously dense, which may obscure small masses. FINDINGS: There are no findings suspicious for malignancy. IMPRESSION: No mammographic evidence of malignancy. A result letter of this screening mammogram will be mailed directly to the patient. RECOMMENDATION: Screening mammogram in one year. (Code:[id number]) BI-RADS CATEGORY  1: Negative. Electronically Signed   By: WAbelardo DieselM.D.   On: 04/14/2022 12:49   DG Chest 2 View  Result Date: 04/12/2022 CLINICAL DATA:   History of positive TB gold test. On  immunosuppressive therapy. Shortness of breath. EXAM: CHEST - 2 VIEW COMPARISON:  None Available. FINDINGS: The heart size and mediastinal contours are within normal limits. Both lungs are clear without evidence of focal consolidation or pleural effusion. Mild thoracic kyphosis and degenerate disc disease. Right shoulder reverse arthroplasty. IMPRESSION: No active cardiopulmonary disease. Electronically Signed   By: Keane Police D.O.   On: 04/12/2022 15:36    ASSESSMENT & PLAN:   81 y.o.  female with  #1 Stage I (pT1b, pN0, Mx) left sided invasive ductal carcinoma of the breast. 1 cm in size with lymphovascular invasion grade 2/3, Ki-67 15% ER/PR positive HER-2/neu negative. One Sentinel lymph node biopsy negative No DCIS. Severe calcified benign lesions in left breast with PASH (pseudoangiomatous stromal hyperplasia).  Patient had left breast lumpectomy by Dr. Ninfa Linden on 07/09/2016.  She completed her adjuvant radiation therapy on 09/2016  Has been on Raloxifene since then for adjuvant endocrine therapy. (AI not considered due to severe osteoporosis with fractures)  05/26/18 MM revealed No mammographic evidence of malignancy in either breast. 2. Stable left breast posttreatment changes.  #2 Severe osteoporosis - multiple risk factors including chronic steroid therapy, age, rheumatoid arthritis. Her bone density study shows significant osteoporosis despite being on Fosamax for 2 years. Last vitamin D levels are adequate at 50 She has had an osteoporotic left hip fracture around May 2016.  #3 Rheumatoid arthritis was on Orencia per rheumatology - currently off due to LTBI status Plan Continue follow-up with Dr. Estanislado Pandy for management of RA   #4 Anemia of chronic disease due to chronic inflammation from RA  #5  History of +ve TB quantiferon test   Plan: 3D mammogram with tomography shows no mammographic evidence of breast cancer. CTA chest shows  no evidence of PE or other intrathoracic process to explain her shortness of breath. Patient's mammogram and CTA chest results were discussed with her in detail.. Continue follow-up with PCP.  FOLLOW UP:  -Continue Prolia every 6 months x 2 - RTC with Dr Irene Limbo with labs with next appointment for Proila in about 5-6 months   The total time spent in the appointment was 10 minutes*.  All of the patient's questions were answered with apparent satisfaction. The patient knows to call the clinic with any problems, questions or concerns.   Sullivan Lone MD MS AAHIVMS [facility] Hematology/Oncology Physician [facility]  .*Total Encounter Time as defined by the Centers for Medicare and Medicaid Services includes, in addition to the face-to-face time of a patient visit (documented in the note above) non-face-to-face time: obtaining and reviewing outside history, ordering and reviewing medications, tests or procedures, care coordination (communications with other health care professionals or caregivers) and documentation in the medical record.

## 2022-05-14 ENCOUNTER — Telehealth: Payer: Self-pay | Admitting: Pharmacist

## 2022-05-14 NOTE — Telephone Encounter (Signed)
Per [place], [place] set to expire on 05/30/22. Submitted a Prior Authorization RENEWAL request to [facility] for [facility] via CoverMyMeds. Will update once we receive a response.  Key: Pleas Koch, PharmD, MPH, BCPS, CPP Clinical Pharmacist (Rheumatology and Pulmonology)

## 2022-05-14 NOTE — Telephone Encounter (Signed)
Received notification from [facility] regarding a prior authorization for [facility]. Authorization has been APPROVED from 05/14/22 to 05/31/2023. Approval letter sent to scan center.  Patient can continue to fill through [employer]: [phone]   Authorization # [id number] Phone # [phone]  Knox Saliva, PharmD, MPH, BCPS, CPP Clinical Pharmacist (Rheumatology and Pulmonology)

## 2022-06-01 ENCOUNTER — Telehealth: Payer: Self-pay | Admitting: *Deleted

## 2022-06-01 NOTE — Telephone Encounter (Signed)
Patient contacted the office to inquire if she should get the RSV vaccine. Patient advised she would need to seek advisement from her PCP. Patient advised if the PCP recommends she does not have to make any modifications to how she takes her medication. Patient expressed understand.

## 2022-06-09 DIAGNOSIS — Z96611 Presence of right artificial shoulder joint: Secondary | ICD-10-CM | POA: Diagnosis not present

## 2022-06-09 DIAGNOSIS — Z471 Aftercare following joint replacement surgery: Secondary | ICD-10-CM | POA: Diagnosis not present

## 2022-06-14 DIAGNOSIS — Z96611 Presence of right artificial shoulder joint: Secondary | ICD-10-CM | POA: Diagnosis not present

## 2022-06-14 DIAGNOSIS — Z471 Aftercare following joint replacement surgery: Secondary | ICD-10-CM | POA: Diagnosis not present

## 2022-06-16 DIAGNOSIS — Z96611 Presence of right artificial shoulder joint: Secondary | ICD-10-CM | POA: Diagnosis not present

## 2022-06-16 DIAGNOSIS — Z471 Aftercare following joint replacement surgery: Secondary | ICD-10-CM | POA: Diagnosis not present

## 2022-06-18 DIAGNOSIS — Z96611 Presence of right artificial shoulder joint: Secondary | ICD-10-CM | POA: Diagnosis not present

## 2022-06-18 DIAGNOSIS — Z471 Aftercare following joint replacement surgery: Secondary | ICD-10-CM | POA: Diagnosis not present

## 2022-06-21 DIAGNOSIS — Z96611 Presence of right artificial shoulder joint: Secondary | ICD-10-CM | POA: Diagnosis not present

## 2022-06-21 DIAGNOSIS — Z471 Aftercare following joint replacement surgery: Secondary | ICD-10-CM | POA: Diagnosis not present

## 2022-06-25 ENCOUNTER — Other Ambulatory Visit (HOSPITAL_COMMUNITY): Payer: Self-pay

## 2022-06-25 ENCOUNTER — Other Ambulatory Visit: Payer: Self-pay

## 2022-06-25 ENCOUNTER — Other Ambulatory Visit: Payer: Self-pay | Admitting: Physician Assistant

## 2022-06-25 DIAGNOSIS — M05711 Rheumatoid arthritis with rheumatoid factor of right shoulder without organ or systems involvement: Secondary | ICD-10-CM

## 2022-06-25 DIAGNOSIS — Z79899 Other long term (current) drug therapy: Secondary | ICD-10-CM

## 2022-06-25 DIAGNOSIS — Z471 Aftercare following joint replacement surgery: Secondary | ICD-10-CM | POA: Diagnosis not present

## 2022-06-25 DIAGNOSIS — R768 Other specified abnormal immunological findings in serum: Secondary | ICD-10-CM

## 2022-06-25 DIAGNOSIS — M0579 Rheumatoid arthritis with rheumatoid factor of multiple sites without organ or systems involvement: Secondary | ICD-10-CM

## 2022-06-25 DIAGNOSIS — Z96611 Presence of right artificial shoulder joint: Secondary | ICD-10-CM | POA: Diagnosis not present

## 2022-06-25 MED ORDER — ORENCIA 125 MG/ML ~~LOC~~ SOSY
125.0000 mg | PREFILLED_SYRINGE | SUBCUTANEOUS | 0 refills | Status: DC
  Filled 2022-06-25: qty 4, 28d supply, fill #0
  Filled 2022-08-16: qty 4, 28d supply, fill #1
  Filled 2022-09-14: qty 4, 28d supply, fill #2

## 2022-06-25 NOTE — Telephone Encounter (Addendum)
Next Visit: 09/08/2022  Last Visit: 04/07/2022  Last Fill: 02/26/2022  EX:[id number] arthritis of multiple sites without organ or system involvement with positive rheumatoid factor   Current Dose per office note on 04/07/2022: Orencia 125 mg subq every 7 days   Labs: 04/09/2022 hemoglobin 11.7, HCT 35.5, RDW 16.4  Chest x-ray: 04/09/2022 No active cardiopulmonary disease.   Attempted to contact patient's daughter and left message for her to call the office. Patient is due for labs.   Patient's daughter returned the call and will take patient to [facility] for labs. Orders have been released.   Okay to refill orencia?

## 2022-06-28 ENCOUNTER — Other Ambulatory Visit (HOSPITAL_COMMUNITY): Payer: Self-pay

## 2022-06-28 ENCOUNTER — Other Ambulatory Visit: Payer: Self-pay | Admitting: Rheumatology

## 2022-06-28 ENCOUNTER — Other Ambulatory Visit: Payer: Self-pay | Admitting: Hematology

## 2022-06-28 DIAGNOSIS — Z471 Aftercare following joint replacement surgery: Secondary | ICD-10-CM | POA: Diagnosis not present

## 2022-06-28 DIAGNOSIS — Z96611 Presence of right artificial shoulder joint: Secondary | ICD-10-CM | POA: Diagnosis not present

## 2022-06-29 ENCOUNTER — Encounter: Payer: Self-pay | Admitting: Rheumatology

## 2022-06-29 MED ORDER — PREDNISONE 5 MG PO TABS: 5.0000 mg | ORAL_TABLET | Freq: Every day | ORAL | 0 refills | Status: DC

## 2022-06-29 MED ORDER — LEFLUNOMIDE 20 MG PO TABS: 20.0000 mg | ORAL_TABLET | Freq: Every day | ORAL | 0 refills | Status: DC

## 2022-06-29 NOTE — Telephone Encounter (Signed)
Next Visit: 09/08/2022  Last Visit: 04/07/2022  Last Fill: 03/22/2022  DX: Rheumatoid arthritis of multiple sites without organ or system involvement with positive rheumatoid factor   Current Dose per office note on 04/07/2022: Arava 20 mg 1 tablet daily, and prednisone 5 mg 1 tablet daily.   Labs: 04/09/2022 hemoglobin 11.7, HCT 35.5, RDW 16.4   Spoke to patient's daughter on 06/25/2022 and she will take patient to update labs, orders were released.   Okay to refill prednisone and arava?

## 2022-06-30 DIAGNOSIS — Z471 Aftercare following joint replacement surgery: Secondary | ICD-10-CM | POA: Diagnosis not present

## 2022-06-30 DIAGNOSIS — Z96611 Presence of right artificial shoulder joint: Secondary | ICD-10-CM | POA: Diagnosis not present

## 2022-07-01 ENCOUNTER — Other Ambulatory Visit: Payer: Self-pay

## 2022-07-01 DIAGNOSIS — M0579 Rheumatoid arthritis with rheumatoid factor of multiple sites without organ or systems involvement: Secondary | ICD-10-CM | POA: Diagnosis not present

## 2022-07-01 DIAGNOSIS — R768 Other specified abnormal immunological findings in serum: Secondary | ICD-10-CM | POA: Diagnosis not present

## 2022-07-01 DIAGNOSIS — Z79899 Other long term (current) drug therapy: Secondary | ICD-10-CM | POA: Diagnosis not present

## 2022-07-01 DIAGNOSIS — M05711 Rheumatoid arthritis with rheumatoid factor of right shoulder without organ or systems involvement: Secondary | ICD-10-CM | POA: Diagnosis not present

## 2022-07-01 DIAGNOSIS — M05712 Rheumatoid arthritis with rheumatoid factor of left shoulder without organ or systems involvement: Secondary | ICD-10-CM | POA: Diagnosis not present

## 2022-07-02 ENCOUNTER — Telehealth: Payer: Self-pay | Admitting: *Deleted

## 2022-07-02 DIAGNOSIS — Z471 Aftercare following joint replacement surgery: Secondary | ICD-10-CM | POA: Diagnosis not present

## 2022-07-02 DIAGNOSIS — Z96611 Presence of right artificial shoulder joint: Secondary | ICD-10-CM | POA: Diagnosis not present

## 2022-07-02 NOTE — Telephone Encounter (Signed)
Labs received from:[facility]  Drawn on:07/01/2022  Reviewed by:Dr. Bo Merino   Labs drawn:CBC, CMP  Results:Glucose 118   Absolute Monocytes 978  Patient is on Orencia 125 mg SQ weekly, Prednisone 5 mg po daily and Arava 20 mg daily.

## 2022-07-04 ENCOUNTER — Other Ambulatory Visit: Payer: Self-pay | Admitting: Hematology

## 2022-07-05 DIAGNOSIS — Z471 Aftercare following joint replacement surgery: Secondary | ICD-10-CM | POA: Diagnosis not present

## 2022-07-05 DIAGNOSIS — Z96611 Presence of right artificial shoulder joint: Secondary | ICD-10-CM | POA: Diagnosis not present

## 2022-07-05 MED ORDER — ALBUTEROL SULFATE HFA 108 (90 BASE) MCG/ACT IN AERS: 1.0000 | INHALATION_SPRAY | Freq: Two times a day (BID) | RESPIRATORY_TRACT | 0 refills | Status: DC | PRN

## 2022-07-08 DIAGNOSIS — S0093XA Contusion of unspecified part of head, initial encounter: Secondary | ICD-10-CM | POA: Diagnosis not present

## 2022-07-08 DIAGNOSIS — J45909 Unspecified asthma, uncomplicated: Secondary | ICD-10-CM | POA: Diagnosis not present

## 2022-07-11 DIAGNOSIS — R0602 Shortness of breath: Secondary | ICD-10-CM | POA: Diagnosis not present

## 2022-07-11 DIAGNOSIS — Z79899 Other long term (current) drug therapy: Secondary | ICD-10-CM | POA: Diagnosis not present

## 2022-07-11 DIAGNOSIS — J45909 Unspecified asthma, uncomplicated: Secondary | ICD-10-CM | POA: Diagnosis not present

## 2022-07-11 DIAGNOSIS — R0600 Dyspnea, unspecified: Secondary | ICD-10-CM | POA: Diagnosis not present

## 2022-07-11 DIAGNOSIS — M069 Rheumatoid arthritis, unspecified: Secondary | ICD-10-CM | POA: Diagnosis not present

## 2022-07-11 DIAGNOSIS — Z20822 Contact with and (suspected) exposure to covid-19: Secondary | ICD-10-CM | POA: Diagnosis not present

## 2022-07-11 DIAGNOSIS — I491 Atrial premature depolarization: Secondary | ICD-10-CM | POA: Diagnosis not present

## 2022-07-11 DIAGNOSIS — M2550 Pain in unspecified joint: Secondary | ICD-10-CM | POA: Diagnosis not present

## 2022-07-11 DIAGNOSIS — I10 Essential (primary) hypertension: Secondary | ICD-10-CM | POA: Diagnosis not present

## 2022-07-13 DIAGNOSIS — J4540 Moderate persistent asthma, uncomplicated: Secondary | ICD-10-CM | POA: Diagnosis not present

## 2022-07-13 DIAGNOSIS — M069 Rheumatoid arthritis, unspecified: Secondary | ICD-10-CM | POA: Diagnosis not present

## 2022-07-13 DIAGNOSIS — D8481 Immunodeficiency due to conditions classified elsewhere: Secondary | ICD-10-CM | POA: Diagnosis not present

## 2022-07-13 DIAGNOSIS — J329 Chronic sinusitis, unspecified: Secondary | ICD-10-CM | POA: Diagnosis not present

## 2022-07-14 DIAGNOSIS — Z471 Aftercare following joint replacement surgery: Secondary | ICD-10-CM | POA: Diagnosis not present

## 2022-07-14 DIAGNOSIS — Z96611 Presence of right artificial shoulder joint: Secondary | ICD-10-CM | POA: Diagnosis not present

## 2022-07-16 DIAGNOSIS — Z471 Aftercare following joint replacement surgery: Secondary | ICD-10-CM | POA: Diagnosis not present

## 2022-07-16 DIAGNOSIS — Z96611 Presence of right artificial shoulder joint: Secondary | ICD-10-CM | POA: Diagnosis not present

## 2022-07-21 DIAGNOSIS — Z471 Aftercare following joint replacement surgery: Secondary | ICD-10-CM | POA: Diagnosis not present

## 2022-07-21 DIAGNOSIS — Z96611 Presence of right artificial shoulder joint: Secondary | ICD-10-CM | POA: Diagnosis not present

## 2022-07-23 DIAGNOSIS — Z96611 Presence of right artificial shoulder joint: Secondary | ICD-10-CM | POA: Diagnosis not present

## 2022-07-23 DIAGNOSIS — Z471 Aftercare following joint replacement surgery: Secondary | ICD-10-CM | POA: Diagnosis not present

## 2022-07-26 ENCOUNTER — Other Ambulatory Visit (HOSPITAL_COMMUNITY): Payer: Self-pay

## 2022-07-27 DIAGNOSIS — R000 Tachycardia, unspecified: Secondary | ICD-10-CM | POA: Diagnosis not present

## 2022-07-27 DIAGNOSIS — R42 Dizziness and giddiness: Secondary | ICD-10-CM | POA: Diagnosis not present

## 2022-07-27 DIAGNOSIS — I2489 Other forms of acute ischemic heart disease: Secondary | ICD-10-CM | POA: Diagnosis not present

## 2022-07-27 DIAGNOSIS — S0003XA Contusion of scalp, initial encounter: Secondary | ICD-10-CM | POA: Diagnosis not present

## 2022-07-27 DIAGNOSIS — R0600 Dyspnea, unspecified: Secondary | ICD-10-CM | POA: Diagnosis not present

## 2022-07-27 DIAGNOSIS — R9230 Dense breasts, unspecified: Secondary | ICD-10-CM | POA: Diagnosis not present

## 2022-07-27 DIAGNOSIS — J45909 Unspecified asthma, uncomplicated: Secondary | ICD-10-CM | POA: Diagnosis not present

## 2022-07-27 DIAGNOSIS — R5381 Other malaise: Secondary | ICD-10-CM | POA: Diagnosis not present

## 2022-07-27 DIAGNOSIS — I082 Rheumatic disorders of both aortic and tricuspid valves: Secondary | ICD-10-CM | POA: Diagnosis not present

## 2022-07-27 DIAGNOSIS — I491 Atrial premature depolarization: Secondary | ICD-10-CM | POA: Diagnosis not present

## 2022-07-27 DIAGNOSIS — K5730 Diverticulosis of large intestine without perforation or abscess without bleeding: Secondary | ICD-10-CM | POA: Diagnosis not present

## 2022-07-27 DIAGNOSIS — I6522 Occlusion and stenosis of left carotid artery: Secondary | ICD-10-CM | POA: Diagnosis not present

## 2022-07-27 DIAGNOSIS — R0789 Other chest pain: Secondary | ICD-10-CM | POA: Diagnosis not present

## 2022-07-27 DIAGNOSIS — R296 Repeated falls: Secondary | ICD-10-CM | POA: Diagnosis not present

## 2022-07-27 DIAGNOSIS — J310 Chronic rhinitis: Secondary | ICD-10-CM | POA: Diagnosis not present

## 2022-07-27 DIAGNOSIS — I2510 Atherosclerotic heart disease of native coronary artery without angina pectoris: Secondary | ICD-10-CM | POA: Diagnosis not present

## 2022-07-27 DIAGNOSIS — M069 Rheumatoid arthritis, unspecified: Secondary | ICD-10-CM | POA: Diagnosis not present

## 2022-07-27 DIAGNOSIS — R2681 Unsteadiness on feet: Secondary | ICD-10-CM | POA: Diagnosis not present

## 2022-07-27 DIAGNOSIS — I6782 Cerebral ischemia: Secondary | ICD-10-CM | POA: Diagnosis not present

## 2022-07-27 DIAGNOSIS — I700 Atherosclerosis of aorta: Secondary | ICD-10-CM | POA: Diagnosis not present

## 2022-07-27 DIAGNOSIS — D649 Anemia, unspecified: Secondary | ICD-10-CM | POA: Diagnosis not present

## 2022-07-27 DIAGNOSIS — R0602 Shortness of breath: Secondary | ICD-10-CM | POA: Diagnosis not present

## 2022-07-27 DIAGNOSIS — R7989 Other specified abnormal findings of blood chemistry: Secondary | ICD-10-CM | POA: Diagnosis not present

## 2022-07-27 DIAGNOSIS — I10 Essential (primary) hypertension: Secondary | ICD-10-CM | POA: Diagnosis not present

## 2022-07-27 DIAGNOSIS — S2243XA Multiple fractures of ribs, bilateral, initial encounter for closed fracture: Secondary | ICD-10-CM | POA: Diagnosis not present

## 2022-07-27 DIAGNOSIS — M898X9 Other specified disorders of bone, unspecified site: Secondary | ICD-10-CM | POA: Diagnosis not present

## 2022-07-27 DIAGNOSIS — I672 Cerebral atherosclerosis: Secondary | ICD-10-CM | POA: Diagnosis not present

## 2022-07-27 DIAGNOSIS — J449 Chronic obstructive pulmonary disease, unspecified: Secondary | ICD-10-CM | POA: Diagnosis not present

## 2022-07-27 DIAGNOSIS — R509 Fever, unspecified: Secondary | ICD-10-CM | POA: Diagnosis not present

## 2022-07-28 DIAGNOSIS — R42 Dizziness and giddiness: Secondary | ICD-10-CM | POA: Diagnosis not present

## 2022-07-28 DIAGNOSIS — I10 Essential (primary) hypertension: Secondary | ICD-10-CM | POA: Diagnosis not present

## 2022-07-28 DIAGNOSIS — R7989 Other specified abnormal findings of blood chemistry: Secondary | ICD-10-CM | POA: Diagnosis not present

## 2022-07-28 DIAGNOSIS — R0600 Dyspnea, unspecified: Secondary | ICD-10-CM | POA: Diagnosis not present

## 2022-07-29 DIAGNOSIS — I358 Other nonrheumatic aortic valve disorders: Secondary | ICD-10-CM | POA: Diagnosis not present

## 2022-07-29 DIAGNOSIS — R079 Chest pain, unspecified: Secondary | ICD-10-CM | POA: Diagnosis not present

## 2022-07-29 DIAGNOSIS — R0600 Dyspnea, unspecified: Secondary | ICD-10-CM | POA: Diagnosis not present

## 2022-07-29 DIAGNOSIS — I3481 Nonrheumatic mitral (valve) annulus calcification: Secondary | ICD-10-CM | POA: Diagnosis not present

## 2022-08-06 DIAGNOSIS — J45909 Unspecified asthma, uncomplicated: Secondary | ICD-10-CM | POA: Diagnosis not present

## 2022-08-06 DIAGNOSIS — R42 Dizziness and giddiness: Secondary | ICD-10-CM | POA: Diagnosis not present

## 2022-08-06 DIAGNOSIS — M069 Rheumatoid arthritis, unspecified: Secondary | ICD-10-CM | POA: Diagnosis not present

## 2022-08-06 DIAGNOSIS — I10 Essential (primary) hypertension: Secondary | ICD-10-CM | POA: Diagnosis not present

## 2022-08-06 DIAGNOSIS — R296 Repeated falls: Secondary | ICD-10-CM | POA: Diagnosis not present

## 2022-08-16 ENCOUNTER — Other Ambulatory Visit: Payer: Self-pay

## 2022-08-16 ENCOUNTER — Other Ambulatory Visit (HOSPITAL_COMMUNITY): Payer: Self-pay

## 2022-08-17 ENCOUNTER — Telehealth: Payer: Self-pay | Admitting: Rheumatology

## 2022-08-17 ENCOUNTER — Other Ambulatory Visit (HOSPITAL_COMMUNITY): Payer: Self-pay

## 2022-08-17 NOTE — Telephone Encounter (Signed)
Ok to schedule virtual visit.

## 2022-08-17 NOTE — Telephone Encounter (Signed)
Patients son-in-law called the office requesting to have Felicia Gonzalez's next appointment in April be made into a virtual. He states she does not need to be seen sooner but it was discussed in office that she could have a virtual if needed. Please advise.

## 2022-08-19 NOTE — Telephone Encounter (Signed)
Reached out to [employer] (pts son-in-law) and he states that they are not in the state. Advised Sandeep that we can only do a virtual appt if the patient is physically in [place] because the providers aren't licensed anywhere but [place]. He expressed understanding and would call back once he speaks with Zyair and may try to bring her in for an in-person appt.

## 2022-08-20 DIAGNOSIS — R296 Repeated falls: Secondary | ICD-10-CM | POA: Diagnosis not present

## 2022-08-20 DIAGNOSIS — J45909 Unspecified asthma, uncomplicated: Secondary | ICD-10-CM | POA: Diagnosis not present

## 2022-08-20 DIAGNOSIS — R42 Dizziness and giddiness: Secondary | ICD-10-CM | POA: Diagnosis not present

## 2022-08-20 DIAGNOSIS — I10 Essential (primary) hypertension: Secondary | ICD-10-CM | POA: Diagnosis not present

## 2022-08-20 DIAGNOSIS — M069 Rheumatoid arthritis, unspecified: Secondary | ICD-10-CM | POA: Diagnosis not present

## 2022-08-25 ENCOUNTER — Other Ambulatory Visit: Payer: Self-pay | Admitting: Hematology

## 2022-08-25 NOTE — Progress Notes (Deleted)
Office Visit Note  Patient: Felicia Gonzalez             Date of Birth: 1941-02-04           MRN: [id number]             PCP: [name], MD Referring: Kathyrn Lass, MD Visit Date: 09/08/2022 Occupation: @GUAROCC @  Subjective:  No chief complaint on file.   History of Present Illness: Felicia Gonzalez is a 82 y.o. female ***     Activities of Daily Living:  Patient reports morning stiffness for *** {minute/hour:19697}.   Patient {ACTIONS;DENIES/REPORTS:21021675::"Denies"} nocturnal pain.  Difficulty dressing/grooming: {ACTIONS;DENIES/REPORTS:21021675::"Denies"} Difficulty climbing stairs: {ACTIONS;DENIES/REPORTS:21021675::"Denies"} Difficulty getting out of chair: {ACTIONS;DENIES/REPORTS:21021675::"Denies"} Difficulty using hands for taps, buttons, cutlery, and/or writing: {ACTIONS;DENIES/REPORTS:21021675::"Denies"}  No Rheumatology ROS completed.   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, estrogen receptor positive ([facility]) 10/04/2016   Osteoporosis 10/04/2016   High risk medication use 06/08/2016   Age-related osteoporosis without current pathological fracture 06/08/2016   ANA positive 06/08/2016   Moderate persistent asthma 06/08/2016   Normocytic anemia 10/01/2014   Rheumatoid arthritis of multiple sites without organ or system involvement with positive rheumatoid factor ([facility] [place]) 10/01/2014   Subcapital fracture of left hip ([facility]) 10/01/2014    Past Medical History:  Diagnosis Date   Anemia    Arthritis    rheumatoid +CCP neg RF +ANA synovitis    Asthma    Breast cancer ([facility]) 2018   Left Breast Cancer   Cancer ([facility])    Breast cancer   Glaucoma    right eye   Hypertension    Osteoporosis    Personal  history of radiation therapy 09/2016   Left Breast Cancer    Family History  Problem Relation Age of Onset   Hypertension Father    Past Surgical History:  Procedure Laterality Date   BREAST BIOPSY Left 05/14/2016   x2   BREAST BIOPSY Right 05/14/2016   BREAST LUMPECTOMY Left 07/09/2016   BREAST LUMPECTOMY WITH RADIOACTIVE SEED AND SENTINEL LYMPH NODE BIOPSY Left 07/09/2016   Procedure: BREAST LUMPECTOMY WITH RADIOACTIVE SEED AND SENTINEL LYMPH NODE BIOPSY;  Surgeon: Coralie Keens, MD;  Location: [facility];  Service: General;  Laterality: Left;   GLAUCOMA SURGERY     NO PAST SURGERIES     REVERSE SHOULDER ARTHROPLASTY Right 12/10/2021   Procedure: REVERSE SHOULDER ARTHROPLASTY;  Surgeon: Justice Britain, MD;  Location: [facility];  Service: Orthopedics;  Laterality: Right;   SHOULDER SURGERY Right 11/2021   TOTAL HIP ARTHROPLASTY Left 10/01/2014   Procedure: TOTAL HIP ARTHROPLASTY;  Surgeon: Garald Balding, MD;  Location: [facility];  Service: Orthopedics;  Laterality: Left;   Social History   Social History Narrative   Not on file   Immunization History  Administered Date(s) Administered   Influenza Split 03/02/2010   Influenza, High Dose Seasonal PF 04/11/2017, 04/17/2018, 01/30/2019   Influenza,inj,Quad PF,6+ Mos 02/06/2016   Influenza,inj,quad, With Preservative 05/03/2014, 02/11/2021   Influenza-Unspecified 02/09/2013   PFIZER Comirnaty(Gray Top)Covid-19 Tri-Sucrose Vaccine 09/23/2020   PFIZER(Purple Top)SARS-COV-2 Vaccination 06/20/2019, 07/11/2019, 02/06/2020, 02/18/2020, 09/23/2020   Pfizer Covid-19 Vaccine Bivalent Booster 61yrs & up 02/11/2021   Pneumococcal Conjugate-13 05/03/2014   Pneumococcal Polysaccharide-23 08/01/1998, 08/10/2006, 10/02/2010  Td 10/08/1999   Tdap 10/02/2010   Zoster, Live 10/08/2020     Objective: Vital Signs: There were no vitals taken for this visit.   Physical Exam   Musculoskeletal Exam: ***  CDAI Exam: CDAI Score: -- Patient Global:  --; Provider Global: -- Swollen: --; Tender: -- Joint Exam 09/08/2022   No joint exam has been documented for this visit   There is currently no information documented on the homunculus. Go to the Rheumatology activity and complete the homunculus joint exam.  Investigation: No additional findings.  Imaging: No results found.  Recent Labs: Lab Results  Component Value Date   WBC 6.0 04/09/2022   HGB 11.7 (L) 04/09/2022   PLT 272 04/09/2022   NA 137 04/09/2022   K 4.3 04/09/2022   CL 102 04/09/2022   CO2 27 04/09/2022   GLUCOSE 89 04/09/2022   BUN 11 04/09/2022   CREATININE 0.60 04/09/2022   BILITOT 0.4 04/09/2022   ALKPHOS 49 04/09/2022   AST 26 04/09/2022   ALT 26 04/09/2022   PROT 7.1 04/09/2022   ALBUMIN 4.1 04/09/2022   CALCIUM 9.4 04/09/2022   GFRAA 97 07/16/2020   QFTBGOLD Negative 03/10/2016   QFTBGOLDPLUS POSITIVE (A) 07/21/2018    Speciality Comments: Orencia IV every 4 weeks  Procedures:  No procedures performed Allergies: Penicillins and Sulfa antibiotics   Assessment / Plan:     Visit Diagnoses: Rheumatoid arthritis of multiple sites without organ or system involvement with positive rheumatoid factor (HCC)  High risk medication use  Rheumatoid arthritis involving both shoulders with positive rheumatoid factor (HCC)  Positive QuantiFERON-TB Gold test  Current chronic use of systemic steroids  ANA positive  DDD (degenerative disc disease), lumbar  Age-related osteoporosis without current pathological fracture  Malignant neoplasm of upper-inner quadrant of left breast in female, estrogen receptor positive (HCC)  Physical deconditioning  History of recent fall  History of anemia  History of asthma  Adhesive capsulitis of both shoulders  Orders: No orders of the defined types were placed in this encounter.  No orders of the defined types were placed in this encounter.   Face-to-face time spent with patient was *** minutes. Greater  than 50% of time was spent in counseling and coordination of care.  Follow-Up Instructions: No follow-ups on file.   Ofilia Neas, PA-C  Note - This record has been created using Dragon software.  Chart creation errors have been sought, but may not always  have been located. Such creation errors do not reflect on  the standard of medical care.

## 2022-08-26 DIAGNOSIS — R42 Dizziness and giddiness: Secondary | ICD-10-CM | POA: Diagnosis not present

## 2022-08-26 DIAGNOSIS — R296 Repeated falls: Secondary | ICD-10-CM | POA: Diagnosis not present

## 2022-08-26 DIAGNOSIS — J45909 Unspecified asthma, uncomplicated: Secondary | ICD-10-CM | POA: Diagnosis not present

## 2022-08-26 DIAGNOSIS — M069 Rheumatoid arthritis, unspecified: Secondary | ICD-10-CM | POA: Diagnosis not present

## 2022-08-26 DIAGNOSIS — I10 Essential (primary) hypertension: Secondary | ICD-10-CM | POA: Diagnosis not present

## 2022-08-29 DIAGNOSIS — I10 Essential (primary) hypertension: Secondary | ICD-10-CM | POA: Diagnosis not present

## 2022-08-29 DIAGNOSIS — M069 Rheumatoid arthritis, unspecified: Secondary | ICD-10-CM | POA: Diagnosis not present

## 2022-08-29 DIAGNOSIS — J45909 Unspecified asthma, uncomplicated: Secondary | ICD-10-CM | POA: Diagnosis not present

## 2022-09-01 DIAGNOSIS — H903 Sensorineural hearing loss, bilateral: Secondary | ICD-10-CM | POA: Diagnosis not present

## 2022-09-01 DIAGNOSIS — R2689 Other abnormalities of gait and mobility: Secondary | ICD-10-CM | POA: Diagnosis not present

## 2022-09-01 DIAGNOSIS — R490 Dysphonia: Secondary | ICD-10-CM | POA: Diagnosis not present

## 2022-09-01 DIAGNOSIS — K219 Gastro-esophageal reflux disease without esophagitis: Secondary | ICD-10-CM | POA: Diagnosis not present

## 2022-09-02 ENCOUNTER — Other Ambulatory Visit (HOSPITAL_COMMUNITY): Payer: Self-pay

## 2022-09-03 ENCOUNTER — Telehealth: Payer: Self-pay | Admitting: Rheumatology

## 2022-09-03 DIAGNOSIS — R42 Dizziness and giddiness: Secondary | ICD-10-CM | POA: Diagnosis not present

## 2022-09-03 DIAGNOSIS — I10 Essential (primary) hypertension: Secondary | ICD-10-CM | POA: Diagnosis not present

## 2022-09-03 DIAGNOSIS — M069 Rheumatoid arthritis, unspecified: Secondary | ICD-10-CM | POA: Diagnosis not present

## 2022-09-03 DIAGNOSIS — J45909 Unspecified asthma, uncomplicated: Secondary | ICD-10-CM | POA: Diagnosis not present

## 2022-09-03 DIAGNOSIS — R296 Repeated falls: Secondary | ICD-10-CM | POA: Diagnosis not present

## 2022-09-03 NOTE — Telephone Encounter (Signed)
Sandeep called the office to reschedule Undine's April 10th appointment. He states they were trying to get her back to [place] but she is not doing well. He states hospital visits have kept them from traveling with her. She is currently scheduled for July as they believe that is around the timeframe she may be able to travel from [place]. Sandeep states because of her hospital visits she has had labs recently that are Epic for Dr. Corliss Skains to review.

## 2022-09-03 NOTE — Telephone Encounter (Signed)
CBC and CMP were reviewed from February 2024 which are within normal limits.

## 2022-09-07 DIAGNOSIS — Z96642 Presence of left artificial hip joint: Secondary | ICD-10-CM | POA: Diagnosis not present

## 2022-09-07 DIAGNOSIS — Z882 Allergy status to sulfonamides status: Secondary | ICD-10-CM | POA: Diagnosis not present

## 2022-09-07 DIAGNOSIS — M47819 Spondylosis without myelopathy or radiculopathy, site unspecified: Secondary | ICD-10-CM | POA: Diagnosis not present

## 2022-09-07 DIAGNOSIS — Z7952 Long term (current) use of systemic steroids: Secondary | ICD-10-CM | POA: Diagnosis not present

## 2022-09-07 DIAGNOSIS — J9811 Atelectasis: Secondary | ICD-10-CM | POA: Diagnosis not present

## 2022-09-07 DIAGNOSIS — S3993XA Unspecified injury of pelvis, initial encounter: Secondary | ICD-10-CM | POA: Diagnosis not present

## 2022-09-07 DIAGNOSIS — M069 Rheumatoid arthritis, unspecified: Secondary | ICD-10-CM | POA: Diagnosis not present

## 2022-09-07 DIAGNOSIS — J45909 Unspecified asthma, uncomplicated: Secondary | ICD-10-CM | POA: Diagnosis not present

## 2022-09-07 DIAGNOSIS — Z79620 Long term (current) use of immunosuppressive biologic: Secondary | ICD-10-CM | POA: Diagnosis not present

## 2022-09-07 DIAGNOSIS — Z79899 Other long term (current) drug therapy: Secondary | ICD-10-CM | POA: Diagnosis not present

## 2022-09-07 DIAGNOSIS — M8448XA Pathological fracture, other site, initial encounter for fracture: Secondary | ICD-10-CM | POA: Diagnosis not present

## 2022-09-07 DIAGNOSIS — S0003XA Contusion of scalp, initial encounter: Secondary | ICD-10-CM | POA: Diagnosis not present

## 2022-09-07 DIAGNOSIS — Z880 Allergy status to penicillin: Secondary | ICD-10-CM | POA: Diagnosis not present

## 2022-09-07 DIAGNOSIS — I10 Essential (primary) hypertension: Secondary | ICD-10-CM | POA: Diagnosis not present

## 2022-09-07 DIAGNOSIS — Z7951 Long term (current) use of inhaled steroids: Secondary | ICD-10-CM | POA: Diagnosis not present

## 2022-09-08 ENCOUNTER — Ambulatory Visit: Payer: Medicare Other | Admitting: Rheumatology

## 2022-09-08 DIAGNOSIS — R768 Other specified abnormal immunological findings in serum: Secondary | ICD-10-CM

## 2022-09-08 DIAGNOSIS — Z9181 History of falling: Secondary | ICD-10-CM

## 2022-09-08 DIAGNOSIS — M5136 Other intervertebral disc degeneration, lumbar region: Secondary | ICD-10-CM

## 2022-09-08 DIAGNOSIS — Z7952 Long term (current) use of systemic steroids: Secondary | ICD-10-CM

## 2022-09-08 DIAGNOSIS — R5381 Other malaise: Secondary | ICD-10-CM

## 2022-09-08 DIAGNOSIS — Z170 Estrogen receptor positive status [ER+]: Secondary | ICD-10-CM

## 2022-09-08 DIAGNOSIS — M7501 Adhesive capsulitis of right shoulder: Secondary | ICD-10-CM

## 2022-09-08 DIAGNOSIS — M0579 Rheumatoid arthritis with rheumatoid factor of multiple sites without organ or systems involvement: Secondary | ICD-10-CM

## 2022-09-08 DIAGNOSIS — M810 Age-related osteoporosis without current pathological fracture: Secondary | ICD-10-CM

## 2022-09-08 DIAGNOSIS — M05711 Rheumatoid arthritis with rheumatoid factor of right shoulder without organ or systems involvement: Secondary | ICD-10-CM

## 2022-09-08 DIAGNOSIS — R7612 Nonspecific reaction to cell mediated immunity measurement of gamma interferon antigen response without active tuberculosis: Secondary | ICD-10-CM

## 2022-09-08 DIAGNOSIS — Z862 Personal history of diseases of the blood and blood-forming organs and certain disorders involving the immune mechanism: Secondary | ICD-10-CM

## 2022-09-08 DIAGNOSIS — Z79899 Other long term (current) drug therapy: Secondary | ICD-10-CM

## 2022-09-08 DIAGNOSIS — Z8709 Personal history of other diseases of the respiratory system: Secondary | ICD-10-CM

## 2022-09-14 ENCOUNTER — Other Ambulatory Visit (HOSPITAL_COMMUNITY): Payer: Self-pay

## 2022-09-15 DIAGNOSIS — R0600 Dyspnea, unspecified: Secondary | ICD-10-CM | POA: Diagnosis not present

## 2022-09-15 DIAGNOSIS — R531 Weakness: Secondary | ICD-10-CM | POA: Diagnosis not present

## 2022-09-15 DIAGNOSIS — I10 Essential (primary) hypertension: Secondary | ICD-10-CM | POA: Diagnosis not present

## 2022-09-21 ENCOUNTER — Other Ambulatory Visit (HOSPITAL_COMMUNITY): Payer: Self-pay

## 2022-09-24 ENCOUNTER — Other Ambulatory Visit (HOSPITAL_COMMUNITY): Payer: Self-pay

## 2022-09-25 ENCOUNTER — Other Ambulatory Visit: Payer: Self-pay | Admitting: Rheumatology

## 2022-09-25 DIAGNOSIS — R55 Syncope and collapse: Secondary | ICD-10-CM | POA: Diagnosis not present

## 2022-09-27 NOTE — Telephone Encounter (Signed)
Last Fill: 06/29/2022  Labs: 09/07/2022 RBC 3.86, HGB 11.2, HCT 35.9, lymphocytes 23.7, monocytes 13.9, basophils 1.1  Next Visit: 12/15/2022  Last Visit: 04/07/2022  LO:[id number] arthritis of multiple sites without organ or system involvement with positive rheumatoid factor   Current Dose per office note on 04/07/2022: Arava 20 mg 1 tablet daily, and prednisone 5 mg 1 tablet daily.   Okay to refill Arava and prednisone?

## 2022-09-28 DIAGNOSIS — J45909 Unspecified asthma, uncomplicated: Secondary | ICD-10-CM | POA: Diagnosis not present

## 2022-09-28 DIAGNOSIS — I10 Essential (primary) hypertension: Secondary | ICD-10-CM | POA: Diagnosis not present

## 2022-09-28 DIAGNOSIS — M069 Rheumatoid arthritis, unspecified: Secondary | ICD-10-CM | POA: Diagnosis not present

## 2022-09-29 ENCOUNTER — Other Ambulatory Visit (HOSPITAL_COMMUNITY): Payer: Self-pay

## 2022-09-29 DIAGNOSIS — R531 Weakness: Secondary | ICD-10-CM | POA: Diagnosis not present

## 2022-09-29 DIAGNOSIS — R259 Unspecified abnormal involuntary movements: Secondary | ICD-10-CM | POA: Diagnosis not present

## 2022-09-29 DIAGNOSIS — Z7409 Other reduced mobility: Secondary | ICD-10-CM | POA: Diagnosis not present

## 2022-09-29 DIAGNOSIS — R29818 Other symptoms and signs involving the nervous system: Secondary | ICD-10-CM | POA: Diagnosis not present

## 2022-09-29 DIAGNOSIS — M21379 Foot drop, unspecified foot: Secondary | ICD-10-CM | POA: Diagnosis not present

## 2022-09-29 DIAGNOSIS — R296 Repeated falls: Secondary | ICD-10-CM | POA: Diagnosis not present

## 2022-09-30 ENCOUNTER — Other Ambulatory Visit: Payer: Self-pay

## 2022-09-30 DIAGNOSIS — Z170 Estrogen receptor positive status [ER+]: Secondary | ICD-10-CM

## 2022-09-30 DIAGNOSIS — R2689 Other abnormalities of gait and mobility: Secondary | ICD-10-CM | POA: Diagnosis not present

## 2022-10-05 ENCOUNTER — Telehealth: Payer: Self-pay | Admitting: Hematology

## 2022-10-05 DIAGNOSIS — R2689 Other abnormalities of gait and mobility: Secondary | ICD-10-CM | POA: Diagnosis not present

## 2022-10-07 DIAGNOSIS — S0993XA Unspecified injury of face, initial encounter: Secondary | ICD-10-CM | POA: Diagnosis not present

## 2022-10-07 DIAGNOSIS — Z79899 Other long term (current) drug therapy: Secondary | ICD-10-CM | POA: Diagnosis not present

## 2022-10-07 DIAGNOSIS — S06360A Traumatic hemorrhage of cerebrum, unspecified, without loss of consciousness, initial encounter: Secondary | ICD-10-CM | POA: Diagnosis not present

## 2022-10-07 DIAGNOSIS — Z7952 Long term (current) use of systemic steroids: Secondary | ICD-10-CM | POA: Diagnosis not present

## 2022-10-07 DIAGNOSIS — G629 Polyneuropathy, unspecified: Secondary | ICD-10-CM | POA: Diagnosis not present

## 2022-10-07 DIAGNOSIS — S06300A Unspecified focal traumatic brain injury without loss of consciousness, initial encounter: Secondary | ICD-10-CM | POA: Diagnosis not present

## 2022-10-07 DIAGNOSIS — Z96611 Presence of right artificial shoulder joint: Secondary | ICD-10-CM | POA: Diagnosis not present

## 2022-10-07 DIAGNOSIS — Z882 Allergy status to sulfonamides status: Secondary | ICD-10-CM | POA: Diagnosis not present

## 2022-10-07 DIAGNOSIS — M25511 Pain in right shoulder: Secondary | ICD-10-CM | POA: Diagnosis not present

## 2022-10-07 DIAGNOSIS — M25521 Pain in right elbow: Secondary | ICD-10-CM | POA: Diagnosis not present

## 2022-10-07 DIAGNOSIS — S4991XA Unspecified injury of right shoulder and upper arm, initial encounter: Secondary | ICD-10-CM | POA: Diagnosis not present

## 2022-10-07 DIAGNOSIS — R519 Headache, unspecified: Secondary | ICD-10-CM | POA: Diagnosis not present

## 2022-10-07 DIAGNOSIS — I6389 Other cerebral infarction: Secondary | ICD-10-CM | POA: Diagnosis not present

## 2022-10-07 DIAGNOSIS — M47812 Spondylosis without myelopathy or radiculopathy, cervical region: Secondary | ICD-10-CM | POA: Diagnosis not present

## 2022-10-07 DIAGNOSIS — S32511A Fracture of superior rim of right pubis, initial encounter for closed fracture: Secondary | ICD-10-CM | POA: Diagnosis not present

## 2022-10-07 DIAGNOSIS — M4712 Other spondylosis with myelopathy, cervical region: Secondary | ICD-10-CM | POA: Diagnosis not present

## 2022-10-07 DIAGNOSIS — Z79620 Long term (current) use of immunosuppressive biologic: Secondary | ICD-10-CM | POA: Diagnosis not present

## 2022-10-07 DIAGNOSIS — Z9181 History of falling: Secondary | ICD-10-CM | POA: Diagnosis not present

## 2022-10-07 DIAGNOSIS — S8991XA Unspecified injury of right lower leg, initial encounter: Secondary | ICD-10-CM | POA: Diagnosis not present

## 2022-10-07 DIAGNOSIS — S59901A Unspecified injury of right elbow, initial encounter: Secondary | ICD-10-CM | POA: Diagnosis not present

## 2022-10-07 DIAGNOSIS — M47816 Spondylosis without myelopathy or radiculopathy, lumbar region: Secondary | ICD-10-CM | POA: Diagnosis not present

## 2022-10-07 DIAGNOSIS — M5030 Other cervical disc degeneration, unspecified cervical region: Secondary | ICD-10-CM | POA: Diagnosis not present

## 2022-10-07 DIAGNOSIS — S0990XA Unspecified injury of head, initial encounter: Secondary | ICD-10-CM | POA: Diagnosis not present

## 2022-10-07 DIAGNOSIS — Z7951 Long term (current) use of inhaled steroids: Secondary | ICD-10-CM | POA: Diagnosis not present

## 2022-10-07 DIAGNOSIS — M542 Cervicalgia: Secondary | ICD-10-CM | POA: Diagnosis not present

## 2022-10-07 DIAGNOSIS — Z853 Personal history of malignant neoplasm of breast: Secondary | ICD-10-CM | POA: Diagnosis not present

## 2022-10-07 DIAGNOSIS — M5136 Other intervertebral disc degeneration, lumbar region: Secondary | ICD-10-CM | POA: Diagnosis not present

## 2022-10-07 DIAGNOSIS — M25561 Pain in right knee: Secondary | ICD-10-CM | POA: Diagnosis not present

## 2022-10-07 DIAGNOSIS — S8011XA Contusion of right lower leg, initial encounter: Secondary | ICD-10-CM | POA: Diagnosis not present

## 2022-10-07 DIAGNOSIS — M4802 Spinal stenosis, cervical region: Secondary | ICD-10-CM | POA: Diagnosis not present

## 2022-10-07 DIAGNOSIS — J45909 Unspecified asthma, uncomplicated: Secondary | ICD-10-CM | POA: Diagnosis not present

## 2022-10-07 DIAGNOSIS — S06330A Contusion and laceration of cerebrum, unspecified, without loss of consciousness, initial encounter: Secondary | ICD-10-CM | POA: Diagnosis not present

## 2022-10-07 DIAGNOSIS — Z880 Allergy status to penicillin: Secondary | ICD-10-CM | POA: Diagnosis not present

## 2022-10-07 DIAGNOSIS — I10 Essential (primary) hypertension: Secondary | ICD-10-CM | POA: Diagnosis not present

## 2022-10-07 DIAGNOSIS — S8001XA Contusion of right knee, initial encounter: Secondary | ICD-10-CM | POA: Diagnosis not present

## 2022-10-07 DIAGNOSIS — R0602 Shortness of breath: Secondary | ICD-10-CM | POA: Diagnosis not present

## 2022-10-07 DIAGNOSIS — S40021A Contusion of right upper arm, initial encounter: Secondary | ICD-10-CM | POA: Diagnosis not present

## 2022-10-07 DIAGNOSIS — K219 Gastro-esophageal reflux disease without esophagitis: Secondary | ICD-10-CM | POA: Diagnosis not present

## 2022-10-07 DIAGNOSIS — M50021 Cervical disc disorder at C4-C5 level with myelopathy: Secondary | ICD-10-CM | POA: Diagnosis not present

## 2022-10-07 DIAGNOSIS — S199XXA Unspecified injury of neck, initial encounter: Secondary | ICD-10-CM | POA: Diagnosis not present

## 2022-10-07 DIAGNOSIS — Z96642 Presence of left artificial hip joint: Secondary | ICD-10-CM | POA: Diagnosis not present

## 2022-10-07 DIAGNOSIS — S062X0A Diffuse traumatic brain injury without loss of consciousness, initial encounter: Secondary | ICD-10-CM | POA: Diagnosis not present

## 2022-10-07 DIAGNOSIS — M069 Rheumatoid arthritis, unspecified: Secondary | ICD-10-CM | POA: Diagnosis not present

## 2022-10-11 ENCOUNTER — Other Ambulatory Visit: Payer: Medicare Other

## 2022-10-11 ENCOUNTER — Inpatient Hospital Stay: Payer: Medicare Other

## 2022-10-11 ENCOUNTER — Inpatient Hospital Stay: Payer: Medicare Other | Admitting: Hematology

## 2022-10-14 ENCOUNTER — Other Ambulatory Visit (HOSPITAL_COMMUNITY): Payer: Self-pay

## 2022-10-14 ENCOUNTER — Other Ambulatory Visit: Payer: Self-pay | Admitting: *Deleted

## 2022-10-14 DIAGNOSIS — M0579 Rheumatoid arthritis with rheumatoid factor of multiple sites without organ or systems involvement: Secondary | ICD-10-CM

## 2022-10-14 MED ORDER — ORENCIA 125 MG/ML ~~LOC~~ SOSY
125.0000 mg | PREFILLED_SYRINGE | SUBCUTANEOUS | 0 refills | Status: DC
Start: 2022-10-14 — End: 2024-02-21
  Filled 2022-10-14: qty 12, 84d supply, fill #0
  Filled 2022-10-20: qty 4, 28d supply, fill #0
  Filled 2022-11-29 – 2022-12-09 (×2): qty 4, 28d supply, fill #1
  Filled 2023-01-03: qty 4, 28d supply, fill #2

## 2022-10-14 NOTE — Telephone Encounter (Signed)
Last Fill: 06/25/2022  Labs: 10/08/2022 RBC 3.65, HGB 10.5, HCT 34.6, MCHC 30.3, Lymphocytes 24.0, Monocytes 13.1, Basophils 1.7, BUN 7, Albumin 3.2,   TB Gold: Chest x-ray 11/10203  No active cardiopulmonary disease   Next Visit: 12/15/2022  Last Visit: 04/07/2022  DX: Rheumatoid arthritis of multiple sites without organ or system involvement with positive rheumatoid factor   Current Dose per office note 04/07/2022: Orencia 125 mg subq every 7 days   Okay to refill Orencia?

## 2022-10-19 DIAGNOSIS — M4316 Spondylolisthesis, lumbar region: Secondary | ICD-10-CM | POA: Diagnosis not present

## 2022-10-20 ENCOUNTER — Other Ambulatory Visit: Payer: Self-pay

## 2022-10-20 ENCOUNTER — Other Ambulatory Visit (HOSPITAL_COMMUNITY): Payer: Self-pay

## 2022-10-21 DIAGNOSIS — J4540 Moderate persistent asthma, uncomplicated: Secondary | ICD-10-CM | POA: Diagnosis not present

## 2022-10-21 DIAGNOSIS — I5189 Other ill-defined heart diseases: Secondary | ICD-10-CM | POA: Diagnosis not present

## 2022-10-21 DIAGNOSIS — J309 Allergic rhinitis, unspecified: Secondary | ICD-10-CM | POA: Diagnosis not present

## 2022-10-21 DIAGNOSIS — D649 Anemia, unspecified: Secondary | ICD-10-CM | POA: Diagnosis not present

## 2022-10-21 DIAGNOSIS — G4730 Sleep apnea, unspecified: Secondary | ICD-10-CM | POA: Diagnosis not present

## 2022-10-21 DIAGNOSIS — M069 Rheumatoid arthritis, unspecified: Secondary | ICD-10-CM | POA: Diagnosis not present

## 2022-10-21 DIAGNOSIS — Z7952 Long term (current) use of systemic steroids: Secondary | ICD-10-CM | POA: Diagnosis not present

## 2022-10-21 DIAGNOSIS — K219 Gastro-esophageal reflux disease without esophagitis: Secondary | ICD-10-CM | POA: Diagnosis not present

## 2022-10-21 DIAGNOSIS — D849 Immunodeficiency, unspecified: Secondary | ICD-10-CM | POA: Diagnosis not present

## 2022-10-22 DIAGNOSIS — R2689 Other abnormalities of gait and mobility: Secondary | ICD-10-CM | POA: Diagnosis not present

## 2022-10-26 ENCOUNTER — Other Ambulatory Visit (HOSPITAL_COMMUNITY): Payer: Self-pay

## 2022-10-27 DIAGNOSIS — K219 Gastro-esophageal reflux disease without esophagitis: Secondary | ICD-10-CM | POA: Diagnosis not present

## 2022-10-27 DIAGNOSIS — D849 Immunodeficiency, unspecified: Secondary | ICD-10-CM | POA: Diagnosis not present

## 2022-10-27 DIAGNOSIS — R2689 Other abnormalities of gait and mobility: Secondary | ICD-10-CM | POA: Diagnosis not present

## 2022-10-27 DIAGNOSIS — I5189 Other ill-defined heart diseases: Secondary | ICD-10-CM | POA: Diagnosis not present

## 2022-10-27 DIAGNOSIS — M21372 Foot drop, left foot: Secondary | ICD-10-CM | POA: Diagnosis not present

## 2022-10-27 DIAGNOSIS — Z7952 Long term (current) use of systemic steroids: Secondary | ICD-10-CM | POA: Diagnosis not present

## 2022-10-27 DIAGNOSIS — J4540 Moderate persistent asthma, uncomplicated: Secondary | ICD-10-CM | POA: Diagnosis not present

## 2022-10-27 DIAGNOSIS — M069 Rheumatoid arthritis, unspecified: Secondary | ICD-10-CM | POA: Diagnosis not present

## 2022-10-27 DIAGNOSIS — M21371 Foot drop, right foot: Secondary | ICD-10-CM | POA: Diagnosis not present

## 2022-10-27 DIAGNOSIS — G629 Polyneuropathy, unspecified: Secondary | ICD-10-CM | POA: Diagnosis not present

## 2022-10-27 DIAGNOSIS — R296 Repeated falls: Secondary | ICD-10-CM | POA: Diagnosis not present

## 2022-10-27 DIAGNOSIS — J309 Allergic rhinitis, unspecified: Secondary | ICD-10-CM | POA: Diagnosis not present

## 2022-10-27 DIAGNOSIS — D649 Anemia, unspecified: Secondary | ICD-10-CM | POA: Diagnosis not present

## 2022-10-28 DIAGNOSIS — Z13850 Encounter for screening for traumatic brain injury: Secondary | ICD-10-CM | POA: Diagnosis not present

## 2022-10-28 DIAGNOSIS — R259 Unspecified abnormal involuntary movements: Secondary | ICD-10-CM | POA: Diagnosis not present

## 2022-10-28 DIAGNOSIS — Z79899 Other long term (current) drug therapy: Secondary | ICD-10-CM | POA: Diagnosis not present

## 2022-10-28 DIAGNOSIS — S06330A Contusion and laceration of cerebrum, unspecified, without loss of consciousness, initial encounter: Secondary | ICD-10-CM | POA: Diagnosis not present

## 2022-10-28 DIAGNOSIS — R296 Repeated falls: Secondary | ICD-10-CM | POA: Diagnosis not present

## 2022-10-28 DIAGNOSIS — G20A1 Parkinson's disease without dyskinesia, without mention of fluctuations: Secondary | ICD-10-CM | POA: Diagnosis not present

## 2022-10-28 DIAGNOSIS — Z7409 Other reduced mobility: Secondary | ICD-10-CM | POA: Diagnosis not present

## 2022-10-28 DIAGNOSIS — I10 Essential (primary) hypertension: Secondary | ICD-10-CM | POA: Diagnosis not present

## 2022-10-29 DIAGNOSIS — Z7189 Other specified counseling: Secondary | ICD-10-CM | POA: Diagnosis not present

## 2022-10-29 DIAGNOSIS — I10 Essential (primary) hypertension: Secondary | ICD-10-CM | POA: Diagnosis not present

## 2022-10-29 DIAGNOSIS — R42 Dizziness and giddiness: Secondary | ICD-10-CM | POA: Diagnosis not present

## 2022-10-29 DIAGNOSIS — R0602 Shortness of breath: Secondary | ICD-10-CM | POA: Diagnosis not present

## 2022-10-31 ENCOUNTER — Other Ambulatory Visit: Payer: Self-pay | Admitting: Hematology

## 2022-11-01 ENCOUNTER — Telehealth: Payer: Self-pay | Admitting: Rheumatology

## 2022-11-01 DIAGNOSIS — M5410 Radiculopathy, site unspecified: Secondary | ICD-10-CM | POA: Diagnosis not present

## 2022-11-01 DIAGNOSIS — K219 Gastro-esophageal reflux disease without esophagitis: Secondary | ICD-10-CM | POA: Diagnosis not present

## 2022-11-01 DIAGNOSIS — R490 Dysphonia: Secondary | ICD-10-CM | POA: Diagnosis not present

## 2022-11-01 DIAGNOSIS — M48061 Spinal stenosis, lumbar region without neurogenic claudication: Secondary | ICD-10-CM | POA: Diagnosis not present

## 2022-11-01 DIAGNOSIS — R0982 Postnasal drip: Secondary | ICD-10-CM | POA: Diagnosis not present

## 2022-11-01 DIAGNOSIS — G959 Disease of spinal cord, unspecified: Secondary | ICD-10-CM | POA: Diagnosis not present

## 2022-11-01 DIAGNOSIS — Z712 Person consulting for explanation of examination or test findings: Secondary | ICD-10-CM | POA: Diagnosis not present

## 2022-11-01 DIAGNOSIS — M4802 Spinal stenosis, cervical region: Secondary | ICD-10-CM | POA: Diagnosis not present

## 2022-11-01 NOTE — Telephone Encounter (Signed)
I received a phone call from the patient.  I also spoke with her son-in-law Darryll Capers.  Patient fell about 3 weeks ago.  She was evaluated by neurologist and was diagnosed with degenerative disc disease of the cervical and lumbar spine based on the MRI scan.  She was also diagnosed with severe lumbar spinal stenosis.  She was recently diagnosed with Parkinson's disease.  She continues to have weakness in upper and lower extremities and generalized deconditioning.  She mentioned that she had recent labs which showed anemia, otherwise unremarkable.  She wanted to know what to do about for Prolia injections, subcutaneous Orencia and Arava.  I advised patient to be evaluated by a PCP and a rheumatologist.  Patient stated that she has an appointment coming up with the rheumatologist in 1 month.  She denies any infection currently.  I advised that she may continue current medications if she does not have any infection and once she establishes with the rheumatologist in [place] and she can transfer her care.  Patient voiced understanding.  She is realizing that she would not be able to come back to [place] and live independently.  She we will plan to stay with her children in [place].  Pollyann Savoy, MD

## 2022-11-02 DIAGNOSIS — R2689 Other abnormalities of gait and mobility: Secondary | ICD-10-CM | POA: Diagnosis not present

## 2022-11-03 DIAGNOSIS — R0602 Shortness of breath: Secondary | ICD-10-CM | POA: Diagnosis not present

## 2022-11-03 DIAGNOSIS — M6259 Muscle wasting and atrophy, not elsewhere classified, multiple sites: Secondary | ICD-10-CM | POA: Diagnosis not present

## 2022-11-03 DIAGNOSIS — M6281 Muscle weakness (generalized): Secondary | ICD-10-CM | POA: Diagnosis not present

## 2022-11-08 DIAGNOSIS — R2689 Other abnormalities of gait and mobility: Secondary | ICD-10-CM | POA: Diagnosis not present

## 2022-11-08 DIAGNOSIS — G4730 Sleep apnea, unspecified: Secondary | ICD-10-CM | POA: Diagnosis not present

## 2022-11-09 DIAGNOSIS — J309 Allergic rhinitis, unspecified: Secondary | ICD-10-CM | POA: Diagnosis not present

## 2022-11-09 DIAGNOSIS — Z7952 Long term (current) use of systemic steroids: Secondary | ICD-10-CM | POA: Diagnosis not present

## 2022-11-09 DIAGNOSIS — D849 Immunodeficiency, unspecified: Secondary | ICD-10-CM | POA: Diagnosis not present

## 2022-11-09 DIAGNOSIS — D649 Anemia, unspecified: Secondary | ICD-10-CM | POA: Diagnosis not present

## 2022-11-09 DIAGNOSIS — G4730 Sleep apnea, unspecified: Secondary | ICD-10-CM | POA: Diagnosis not present

## 2022-11-09 DIAGNOSIS — K219 Gastro-esophageal reflux disease without esophagitis: Secondary | ICD-10-CM | POA: Diagnosis not present

## 2022-11-09 DIAGNOSIS — I5189 Other ill-defined heart diseases: Secondary | ICD-10-CM | POA: Diagnosis not present

## 2022-11-09 DIAGNOSIS — M069 Rheumatoid arthritis, unspecified: Secondary | ICD-10-CM | POA: Diagnosis not present

## 2022-11-09 DIAGNOSIS — J4540 Moderate persistent asthma, uncomplicated: Secondary | ICD-10-CM | POA: Diagnosis not present

## 2022-11-10 DIAGNOSIS — R0602 Shortness of breath: Secondary | ICD-10-CM | POA: Diagnosis not present

## 2022-11-10 DIAGNOSIS — M6281 Muscle weakness (generalized): Secondary | ICD-10-CM | POA: Diagnosis not present

## 2022-11-10 DIAGNOSIS — M6259 Muscle wasting and atrophy, not elsewhere classified, multiple sites: Secondary | ICD-10-CM | POA: Diagnosis not present

## 2022-11-11 DIAGNOSIS — D649 Anemia, unspecified: Secondary | ICD-10-CM | POA: Diagnosis not present

## 2022-11-11 DIAGNOSIS — R2689 Other abnormalities of gait and mobility: Secondary | ICD-10-CM | POA: Diagnosis not present

## 2022-11-15 DIAGNOSIS — R2689 Other abnormalities of gait and mobility: Secondary | ICD-10-CM | POA: Diagnosis not present

## 2022-11-16 DIAGNOSIS — M4316 Spondylolisthesis, lumbar region: Secondary | ICD-10-CM | POA: Diagnosis not present

## 2022-11-16 DIAGNOSIS — M4802 Spinal stenosis, cervical region: Secondary | ICD-10-CM | POA: Diagnosis not present

## 2022-11-18 DIAGNOSIS — R0602 Shortness of breath: Secondary | ICD-10-CM | POA: Diagnosis not present

## 2022-11-18 DIAGNOSIS — M6281 Muscle weakness (generalized): Secondary | ICD-10-CM | POA: Diagnosis not present

## 2022-11-18 DIAGNOSIS — M6259 Muscle wasting and atrophy, not elsewhere classified, multiple sites: Secondary | ICD-10-CM | POA: Diagnosis not present

## 2022-11-19 DIAGNOSIS — M2578 Osteophyte, vertebrae: Secondary | ICD-10-CM | POA: Diagnosis not present

## 2022-11-19 DIAGNOSIS — M4802 Spinal stenosis, cervical region: Secondary | ICD-10-CM | POA: Diagnosis not present

## 2022-11-19 DIAGNOSIS — M4312 Spondylolisthesis, cervical region: Secondary | ICD-10-CM | POA: Diagnosis not present

## 2022-11-19 DIAGNOSIS — M47812 Spondylosis without myelopathy or radiculopathy, cervical region: Secondary | ICD-10-CM | POA: Diagnosis not present

## 2022-11-19 DIAGNOSIS — M50321 Other cervical disc degeneration at C4-C5 level: Secondary | ICD-10-CM | POA: Diagnosis not present

## 2022-11-19 DIAGNOSIS — I6522 Occlusion and stenosis of left carotid artery: Secondary | ICD-10-CM | POA: Diagnosis not present

## 2022-11-22 DIAGNOSIS — R2689 Other abnormalities of gait and mobility: Secondary | ICD-10-CM | POA: Diagnosis not present

## 2022-11-23 DIAGNOSIS — M810 Age-related osteoporosis without current pathological fracture: Secondary | ICD-10-CM | POA: Diagnosis not present

## 2022-11-24 DIAGNOSIS — M21371 Foot drop, right foot: Secondary | ICD-10-CM | POA: Diagnosis not present

## 2022-11-24 DIAGNOSIS — M21372 Foot drop, left foot: Secondary | ICD-10-CM | POA: Diagnosis not present

## 2022-11-24 DIAGNOSIS — G629 Polyneuropathy, unspecified: Secondary | ICD-10-CM | POA: Diagnosis not present

## 2022-11-24 DIAGNOSIS — R296 Repeated falls: Secondary | ICD-10-CM | POA: Diagnosis not present

## 2022-11-25 ENCOUNTER — Other Ambulatory Visit (HOSPITAL_COMMUNITY): Payer: Self-pay

## 2022-11-25 DIAGNOSIS — M6281 Muscle weakness (generalized): Secondary | ICD-10-CM | POA: Diagnosis not present

## 2022-11-25 DIAGNOSIS — R0602 Shortness of breath: Secondary | ICD-10-CM | POA: Diagnosis not present

## 2022-11-25 DIAGNOSIS — M6259 Muscle wasting and atrophy, not elsewhere classified, multiple sites: Secondary | ICD-10-CM | POA: Diagnosis not present

## 2022-11-28 DIAGNOSIS — I10 Essential (primary) hypertension: Secondary | ICD-10-CM | POA: Diagnosis not present

## 2022-11-28 DIAGNOSIS — J45909 Unspecified asthma, uncomplicated: Secondary | ICD-10-CM | POA: Diagnosis not present

## 2022-11-28 DIAGNOSIS — M069 Rheumatoid arthritis, unspecified: Secondary | ICD-10-CM | POA: Diagnosis not present

## 2022-11-29 ENCOUNTER — Other Ambulatory Visit: Payer: Self-pay

## 2022-11-29 ENCOUNTER — Other Ambulatory Visit (HOSPITAL_COMMUNITY): Payer: Self-pay

## 2022-11-30 DIAGNOSIS — R2689 Other abnormalities of gait and mobility: Secondary | ICD-10-CM | POA: Diagnosis not present

## 2022-12-01 DIAGNOSIS — M5412 Radiculopathy, cervical region: Secondary | ICD-10-CM | POA: Diagnosis not present

## 2022-12-01 DIAGNOSIS — M810 Age-related osteoporosis without current pathological fracture: Secondary | ICD-10-CM | POA: Diagnosis not present

## 2022-12-01 DIAGNOSIS — Z79620 Long term (current) use of immunosuppressive biologic: Secondary | ICD-10-CM | POA: Diagnosis not present

## 2022-12-01 DIAGNOSIS — G959 Disease of spinal cord, unspecified: Secondary | ICD-10-CM | POA: Diagnosis not present

## 2022-12-01 DIAGNOSIS — D638 Anemia in other chronic diseases classified elsewhere: Secondary | ICD-10-CM | POA: Diagnosis not present

## 2022-12-01 DIAGNOSIS — M05742 Rheumatoid arthritis with rheumatoid factor of left hand without organ or systems involvement: Secondary | ICD-10-CM | POA: Diagnosis not present

## 2022-12-01 DIAGNOSIS — M05741 Rheumatoid arthritis with rheumatoid factor of right hand without organ or systems involvement: Secondary | ICD-10-CM | POA: Diagnosis not present

## 2022-12-08 ENCOUNTER — Other Ambulatory Visit (HOSPITAL_COMMUNITY): Payer: Self-pay

## 2022-12-09 ENCOUNTER — Other Ambulatory Visit: Payer: Self-pay

## 2022-12-09 ENCOUNTER — Other Ambulatory Visit (HOSPITAL_COMMUNITY): Payer: Self-pay

## 2022-12-13 ENCOUNTER — Other Ambulatory Visit (HOSPITAL_COMMUNITY): Payer: Self-pay

## 2022-12-15 ENCOUNTER — Other Ambulatory Visit (HOSPITAL_COMMUNITY): Payer: Self-pay

## 2022-12-15 ENCOUNTER — Ambulatory Visit: Payer: Medicare Other | Admitting: Rheumatology

## 2022-12-15 DIAGNOSIS — G6289 Other specified polyneuropathies: Secondary | ICD-10-CM | POA: Diagnosis not present

## 2022-12-16 DIAGNOSIS — M6281 Muscle weakness (generalized): Secondary | ICD-10-CM | POA: Diagnosis not present

## 2022-12-16 DIAGNOSIS — M6259 Muscle wasting and atrophy, not elsewhere classified, multiple sites: Secondary | ICD-10-CM | POA: Diagnosis not present

## 2022-12-16 DIAGNOSIS — R0602 Shortness of breath: Secondary | ICD-10-CM | POA: Diagnosis not present

## 2022-12-17 ENCOUNTER — Other Ambulatory Visit (HOSPITAL_COMMUNITY): Payer: Self-pay

## 2022-12-20 ENCOUNTER — Other Ambulatory Visit (HOSPITAL_COMMUNITY): Payer: Self-pay

## 2022-12-20 DIAGNOSIS — M48061 Spinal stenosis, lumbar region without neurogenic claudication: Secondary | ICD-10-CM | POA: Diagnosis not present

## 2022-12-20 DIAGNOSIS — E7800 Pure hypercholesterolemia, unspecified: Secondary | ICD-10-CM | POA: Diagnosis not present

## 2022-12-20 DIAGNOSIS — R000 Tachycardia, unspecified: Secondary | ICD-10-CM | POA: Diagnosis not present

## 2022-12-20 DIAGNOSIS — Z043 Encounter for examination and observation following other accident: Secondary | ICD-10-CM | POA: Diagnosis not present

## 2022-12-20 DIAGNOSIS — W19XXXA Unspecified fall, initial encounter: Secondary | ICD-10-CM | POA: Diagnosis not present

## 2022-12-20 DIAGNOSIS — S0093XA Contusion of unspecified part of head, initial encounter: Secondary | ICD-10-CM | POA: Diagnosis not present

## 2022-12-20 DIAGNOSIS — R402363 Coma scale, best motor response, obeys commands, at hospital admission: Secondary | ICD-10-CM | POA: Diagnosis not present

## 2022-12-20 DIAGNOSIS — R58 Hemorrhage, not elsewhere classified: Secondary | ICD-10-CM | POA: Diagnosis not present

## 2022-12-20 DIAGNOSIS — R402143 Coma scale, eyes open, spontaneous, at hospital admission: Secondary | ICD-10-CM | POA: Diagnosis not present

## 2022-12-20 DIAGNOSIS — E041 Nontoxic single thyroid nodule: Secondary | ICD-10-CM | POA: Diagnosis not present

## 2022-12-20 DIAGNOSIS — E875 Hyperkalemia: Secondary | ICD-10-CM | POA: Diagnosis not present

## 2022-12-20 DIAGNOSIS — S066X4A Traumatic subarachnoid hemorrhage with loss of consciousness of 6 hours to 24 hours, initial encounter: Secondary | ICD-10-CM | POA: Diagnosis not present

## 2022-12-20 DIAGNOSIS — R42 Dizziness and giddiness: Secondary | ICD-10-CM | POA: Diagnosis not present

## 2022-12-20 DIAGNOSIS — Z79899 Other long term (current) drug therapy: Secondary | ICD-10-CM | POA: Diagnosis not present

## 2022-12-20 DIAGNOSIS — Z7951 Long term (current) use of inhaled steroids: Secondary | ICD-10-CM | POA: Diagnosis not present

## 2022-12-20 DIAGNOSIS — J90 Pleural effusion, not elsewhere classified: Secondary | ICD-10-CM | POA: Diagnosis not present

## 2022-12-20 DIAGNOSIS — M069 Rheumatoid arthritis, unspecified: Secondary | ICD-10-CM | POA: Diagnosis not present

## 2022-12-20 DIAGNOSIS — S066X0A Traumatic subarachnoid hemorrhage without loss of consciousness, initial encounter: Secondary | ICD-10-CM | POA: Diagnosis not present

## 2022-12-20 DIAGNOSIS — I700 Atherosclerosis of aorta: Secondary | ICD-10-CM | POA: Diagnosis not present

## 2022-12-20 DIAGNOSIS — R55 Syncope and collapse: Secondary | ICD-10-CM | POA: Diagnosis not present

## 2022-12-20 DIAGNOSIS — G8911 Acute pain due to trauma: Secondary | ICD-10-CM | POA: Diagnosis not present

## 2022-12-20 DIAGNOSIS — S06361A Traumatic hemorrhage of cerebrum, unspecified, with loss of consciousness of 30 minutes or less, initial encounter: Secondary | ICD-10-CM | POA: Diagnosis not present

## 2022-12-20 DIAGNOSIS — I517 Cardiomegaly: Secondary | ICD-10-CM | POA: Diagnosis not present

## 2022-12-20 DIAGNOSIS — R918 Other nonspecific abnormal finding of lung field: Secondary | ICD-10-CM | POA: Diagnosis not present

## 2022-12-20 DIAGNOSIS — Z049 Encounter for examination and observation for unspecified reason: Secondary | ICD-10-CM | POA: Diagnosis not present

## 2022-12-20 DIAGNOSIS — I498 Other specified cardiac arrhythmias: Secondary | ICD-10-CM | POA: Diagnosis not present

## 2022-12-20 DIAGNOSIS — S066XAA Traumatic subarachnoid hemorrhage with loss of consciousness status unknown, initial encounter: Secondary | ICD-10-CM | POA: Diagnosis not present

## 2022-12-20 DIAGNOSIS — Z01812 Encounter for preprocedural laboratory examination: Secondary | ICD-10-CM | POA: Diagnosis not present

## 2022-12-20 DIAGNOSIS — Z743 Need for continuous supervision: Secondary | ICD-10-CM | POA: Diagnosis not present

## 2022-12-20 DIAGNOSIS — S061X1A Traumatic cerebral edema with loss of consciousness of 30 minutes or less, initial encounter: Secondary | ICD-10-CM | POA: Diagnosis not present

## 2022-12-20 DIAGNOSIS — R402253 Coma scale, best verbal response, oriented, at hospital admission: Secondary | ICD-10-CM | POA: Diagnosis not present

## 2022-12-20 DIAGNOSIS — S066X5A Traumatic subarachnoid hemorrhage with loss of consciousness greater than 24 hours with return to pre-existing conscious level, initial encounter: Secondary | ICD-10-CM | POA: Diagnosis not present

## 2022-12-20 DIAGNOSIS — G992 Myelopathy in diseases classified elsewhere: Secondary | ICD-10-CM | POA: Diagnosis not present

## 2022-12-20 DIAGNOSIS — S06331A Contusion and laceration of cerebrum, unspecified, with loss of consciousness of 30 minutes or less, initial encounter: Secondary | ICD-10-CM | POA: Diagnosis not present

## 2022-12-20 DIAGNOSIS — I619 Nontraumatic intracerebral hemorrhage, unspecified: Secondary | ICD-10-CM | POA: Diagnosis not present

## 2022-12-20 DIAGNOSIS — E871 Hypo-osmolality and hyponatremia: Secondary | ICD-10-CM | POA: Diagnosis not present

## 2022-12-20 DIAGNOSIS — I10 Essential (primary) hypertension: Secondary | ICD-10-CM | POA: Diagnosis not present

## 2022-12-20 DIAGNOSIS — M4802 Spinal stenosis, cervical region: Secondary | ICD-10-CM | POA: Diagnosis not present

## 2022-12-20 DIAGNOSIS — S098XXS Other specified injuries of head, sequela: Secondary | ICD-10-CM | POA: Diagnosis not present

## 2022-12-20 DIAGNOSIS — I2510 Atherosclerotic heart disease of native coronary artery without angina pectoris: Secondary | ICD-10-CM | POA: Diagnosis not present

## 2022-12-20 DIAGNOSIS — S066X1A Traumatic subarachnoid hemorrhage with loss of consciousness of 30 minutes or less, initial encounter: Secondary | ICD-10-CM | POA: Diagnosis not present

## 2022-12-20 DIAGNOSIS — I609 Nontraumatic subarachnoid hemorrhage, unspecified: Secondary | ICD-10-CM | POA: Diagnosis not present

## 2022-12-20 DIAGNOSIS — Z96611 Presence of right artificial shoulder joint: Secondary | ICD-10-CM | POA: Diagnosis not present

## 2022-12-20 DIAGNOSIS — I3481 Nonrheumatic mitral (valve) annulus calcification: Secondary | ICD-10-CM | POA: Diagnosis not present

## 2022-12-20 DIAGNOSIS — S06890A Other specified intracranial injury without loss of consciousness, initial encounter: Secondary | ICD-10-CM | POA: Diagnosis not present

## 2022-12-20 DIAGNOSIS — J9811 Atelectasis: Secondary | ICD-10-CM | POA: Diagnosis not present

## 2022-12-20 DIAGNOSIS — J45909 Unspecified asthma, uncomplicated: Secondary | ICD-10-CM | POA: Diagnosis not present

## 2022-12-20 DIAGNOSIS — R9431 Abnormal electrocardiogram [ECG] [EKG]: Secondary | ICD-10-CM | POA: Diagnosis not present

## 2022-12-20 DIAGNOSIS — S0990XA Unspecified injury of head, initial encounter: Secondary | ICD-10-CM | POA: Diagnosis not present

## 2022-12-21 DIAGNOSIS — S066X5A Traumatic subarachnoid hemorrhage with loss of consciousness greater than 24 hours with return to pre-existing conscious level, initial encounter: Secondary | ICD-10-CM | POA: Diagnosis not present

## 2022-12-21 DIAGNOSIS — I517 Cardiomegaly: Secondary | ICD-10-CM | POA: Diagnosis not present

## 2022-12-21 DIAGNOSIS — S06890A Other specified intracranial injury without loss of consciousness, initial encounter: Secondary | ICD-10-CM | POA: Diagnosis not present

## 2022-12-21 DIAGNOSIS — I619 Nontraumatic intracerebral hemorrhage, unspecified: Secondary | ICD-10-CM | POA: Diagnosis not present

## 2022-12-21 DIAGNOSIS — I609 Nontraumatic subarachnoid hemorrhage, unspecified: Secondary | ICD-10-CM | POA: Diagnosis not present

## 2022-12-21 DIAGNOSIS — I3481 Nonrheumatic mitral (valve) annulus calcification: Secondary | ICD-10-CM | POA: Diagnosis not present

## 2022-12-22 DIAGNOSIS — S06890A Other specified intracranial injury without loss of consciousness, initial encounter: Secondary | ICD-10-CM | POA: Diagnosis not present

## 2022-12-23 DIAGNOSIS — J90 Pleural effusion, not elsewhere classified: Secondary | ICD-10-CM | POA: Diagnosis not present

## 2022-12-23 DIAGNOSIS — J9811 Atelectasis: Secondary | ICD-10-CM | POA: Diagnosis not present

## 2022-12-23 DIAGNOSIS — I700 Atherosclerosis of aorta: Secondary | ICD-10-CM | POA: Diagnosis not present

## 2022-12-23 DIAGNOSIS — I10 Essential (primary) hypertension: Secondary | ICD-10-CM | POA: Diagnosis not present

## 2022-12-23 DIAGNOSIS — S06361A Traumatic hemorrhage of cerebrum, unspecified, with loss of consciousness of 30 minutes or less, initial encounter: Secondary | ICD-10-CM | POA: Diagnosis not present

## 2022-12-23 DIAGNOSIS — S066X5A Traumatic subarachnoid hemorrhage with loss of consciousness greater than 24 hours with return to pre-existing conscious level, initial encounter: Secondary | ICD-10-CM | POA: Diagnosis not present

## 2022-12-23 DIAGNOSIS — R918 Other nonspecific abnormal finding of lung field: Secondary | ICD-10-CM | POA: Diagnosis not present

## 2022-12-23 DIAGNOSIS — Z96611 Presence of right artificial shoulder joint: Secondary | ICD-10-CM | POA: Diagnosis not present

## 2022-12-24 ENCOUNTER — Other Ambulatory Visit: Payer: Self-pay

## 2022-12-24 DIAGNOSIS — I2510 Atherosclerotic heart disease of native coronary artery without angina pectoris: Secondary | ICD-10-CM | POA: Diagnosis not present

## 2022-12-24 DIAGNOSIS — I700 Atherosclerosis of aorta: Secondary | ICD-10-CM | POA: Diagnosis not present

## 2022-12-24 DIAGNOSIS — R9431 Abnormal electrocardiogram [ECG] [EKG]: Secondary | ICD-10-CM | POA: Diagnosis not present

## 2022-12-24 DIAGNOSIS — I498 Other specified cardiac arrhythmias: Secondary | ICD-10-CM | POA: Diagnosis not present

## 2022-12-24 DIAGNOSIS — J9811 Atelectasis: Secondary | ICD-10-CM | POA: Diagnosis not present

## 2022-12-24 DIAGNOSIS — R000 Tachycardia, unspecified: Secondary | ICD-10-CM | POA: Diagnosis not present

## 2022-12-24 DIAGNOSIS — S066X5A Traumatic subarachnoid hemorrhage with loss of consciousness greater than 24 hours with return to pre-existing conscious level, initial encounter: Secondary | ICD-10-CM | POA: Diagnosis not present

## 2022-12-24 DIAGNOSIS — J90 Pleural effusion, not elsewhere classified: Secondary | ICD-10-CM | POA: Diagnosis not present

## 2022-12-25 DIAGNOSIS — S066X5A Traumatic subarachnoid hemorrhage with loss of consciousness greater than 24 hours with return to pre-existing conscious level, initial encounter: Secondary | ICD-10-CM | POA: Diagnosis not present

## 2022-12-25 DIAGNOSIS — R918 Other nonspecific abnormal finding of lung field: Secondary | ICD-10-CM | POA: Diagnosis not present

## 2022-12-25 DIAGNOSIS — J90 Pleural effusion, not elsewhere classified: Secondary | ICD-10-CM | POA: Diagnosis not present

## 2022-12-25 DIAGNOSIS — J9811 Atelectasis: Secondary | ICD-10-CM | POA: Diagnosis not present

## 2022-12-27 DIAGNOSIS — R0902 Hypoxemia: Secondary | ICD-10-CM | POA: Diagnosis not present

## 2022-12-27 DIAGNOSIS — Z853 Personal history of malignant neoplasm of breast: Secondary | ICD-10-CM | POA: Diagnosis not present

## 2022-12-27 DIAGNOSIS — Z882 Allergy status to sulfonamides status: Secondary | ICD-10-CM | POA: Diagnosis not present

## 2022-12-27 DIAGNOSIS — M810 Age-related osteoporosis without current pathological fracture: Secondary | ICD-10-CM | POA: Diagnosis not present

## 2022-12-27 DIAGNOSIS — Z79620 Long term (current) use of immunosuppressive biologic: Secondary | ICD-10-CM | POA: Diagnosis not present

## 2022-12-27 DIAGNOSIS — S0083XA Contusion of other part of head, initial encounter: Secondary | ICD-10-CM | POA: Diagnosis not present

## 2022-12-27 DIAGNOSIS — Z7952 Long term (current) use of systemic steroids: Secondary | ICD-10-CM | POA: Diagnosis not present

## 2022-12-27 DIAGNOSIS — R942 Abnormal results of pulmonary function studies: Secondary | ICD-10-CM | POA: Diagnosis not present

## 2022-12-27 DIAGNOSIS — Z86711 Personal history of pulmonary embolism: Secondary | ICD-10-CM | POA: Diagnosis not present

## 2022-12-27 DIAGNOSIS — S0003XA Contusion of scalp, initial encounter: Secondary | ICD-10-CM | POA: Diagnosis not present

## 2022-12-27 DIAGNOSIS — J9811 Atelectasis: Secondary | ICD-10-CM | POA: Diagnosis not present

## 2022-12-27 DIAGNOSIS — Z1152 Encounter for screening for COVID-19: Secondary | ICD-10-CM | POA: Diagnosis not present

## 2022-12-27 DIAGNOSIS — J9601 Acute respiratory failure with hypoxia: Secondary | ICD-10-CM | POA: Diagnosis not present

## 2022-12-27 DIAGNOSIS — J90 Pleural effusion, not elsewhere classified: Secondary | ICD-10-CM | POA: Diagnosis not present

## 2022-12-27 DIAGNOSIS — S066X0A Traumatic subarachnoid hemorrhage without loss of consciousness, initial encounter: Secondary | ICD-10-CM | POA: Diagnosis not present

## 2022-12-27 DIAGNOSIS — M059 Rheumatoid arthritis with rheumatoid factor, unspecified: Secondary | ICD-10-CM | POA: Diagnosis not present

## 2022-12-27 DIAGNOSIS — H409 Unspecified glaucoma: Secondary | ICD-10-CM | POA: Diagnosis not present

## 2022-12-27 DIAGNOSIS — Z880 Allergy status to penicillin: Secondary | ICD-10-CM | POA: Diagnosis not present

## 2022-12-27 DIAGNOSIS — Z7901 Long term (current) use of anticoagulants: Secondary | ICD-10-CM | POA: Diagnosis not present

## 2022-12-27 DIAGNOSIS — J45909 Unspecified asthma, uncomplicated: Secondary | ICD-10-CM | POA: Diagnosis not present

## 2022-12-27 DIAGNOSIS — Z79899 Other long term (current) drug therapy: Secondary | ICD-10-CM | POA: Diagnosis not present

## 2022-12-27 DIAGNOSIS — Z7951 Long term (current) use of inhaled steroids: Secondary | ICD-10-CM | POA: Diagnosis not present

## 2022-12-27 DIAGNOSIS — M069 Rheumatoid arthritis, unspecified: Secondary | ICD-10-CM | POA: Diagnosis not present

## 2022-12-27 DIAGNOSIS — I609 Nontraumatic subarachnoid hemorrhage, unspecified: Secondary | ICD-10-CM | POA: Diagnosis not present

## 2022-12-27 DIAGNOSIS — G629 Polyneuropathy, unspecified: Secondary | ICD-10-CM | POA: Diagnosis not present

## 2022-12-27 DIAGNOSIS — I10 Essential (primary) hypertension: Secondary | ICD-10-CM | POA: Diagnosis not present

## 2022-12-27 DIAGNOSIS — J9691 Respiratory failure, unspecified with hypoxia: Secondary | ICD-10-CM | POA: Diagnosis not present

## 2022-12-27 DIAGNOSIS — K219 Gastro-esophageal reflux disease without esophagitis: Secondary | ICD-10-CM | POA: Diagnosis not present

## 2022-12-27 DIAGNOSIS — R296 Repeated falls: Secondary | ICD-10-CM | POA: Diagnosis not present

## 2022-12-27 DIAGNOSIS — G20C Parkinsonism, unspecified: Secondary | ICD-10-CM | POA: Diagnosis not present

## 2022-12-27 DIAGNOSIS — Z743 Need for continuous supervision: Secondary | ICD-10-CM | POA: Diagnosis not present

## 2022-12-27 DIAGNOSIS — I272 Pulmonary hypertension, unspecified: Secondary | ICD-10-CM | POA: Diagnosis not present

## 2022-12-27 DIAGNOSIS — I491 Atrial premature depolarization: Secondary | ICD-10-CM | POA: Diagnosis not present

## 2022-12-27 DIAGNOSIS — S061X0A Traumatic cerebral edema without loss of consciousness, initial encounter: Secondary | ICD-10-CM | POA: Diagnosis not present

## 2022-12-27 DIAGNOSIS — I2699 Other pulmonary embolism without acute cor pulmonale: Secondary | ICD-10-CM | POA: Diagnosis not present

## 2022-12-27 DIAGNOSIS — D649 Anemia, unspecified: Secondary | ICD-10-CM | POA: Diagnosis not present

## 2022-12-27 DIAGNOSIS — R600 Localized edema: Secondary | ICD-10-CM | POA: Diagnosis not present

## 2022-12-27 DIAGNOSIS — J918 Pleural effusion in other conditions classified elsewhere: Secondary | ICD-10-CM | POA: Diagnosis not present

## 2022-12-27 DIAGNOSIS — Z923 Personal history of irradiation: Secondary | ICD-10-CM | POA: Diagnosis not present

## 2022-12-28 DIAGNOSIS — I2699 Other pulmonary embolism without acute cor pulmonale: Secondary | ICD-10-CM | POA: Diagnosis not present

## 2022-12-29 DIAGNOSIS — I2699 Other pulmonary embolism without acute cor pulmonale: Secondary | ICD-10-CM | POA: Diagnosis not present

## 2022-12-29 DIAGNOSIS — D649 Anemia, unspecified: Secondary | ICD-10-CM | POA: Diagnosis not present

## 2022-12-29 DIAGNOSIS — M069 Rheumatoid arthritis, unspecified: Secondary | ICD-10-CM | POA: Diagnosis not present

## 2022-12-29 DIAGNOSIS — R296 Repeated falls: Secondary | ICD-10-CM | POA: Diagnosis not present

## 2022-12-29 DIAGNOSIS — J9691 Respiratory failure, unspecified with hypoxia: Secondary | ICD-10-CM | POA: Diagnosis not present

## 2022-12-29 DIAGNOSIS — I10 Essential (primary) hypertension: Secondary | ICD-10-CM | POA: Diagnosis not present

## 2022-12-29 DIAGNOSIS — J45909 Unspecified asthma, uncomplicated: Secondary | ICD-10-CM | POA: Diagnosis not present

## 2022-12-29 DIAGNOSIS — R0902 Hypoxemia: Secondary | ICD-10-CM | POA: Diagnosis not present

## 2022-12-29 DIAGNOSIS — J9601 Acute respiratory failure with hypoxia: Secondary | ICD-10-CM | POA: Diagnosis not present

## 2022-12-30 ENCOUNTER — Other Ambulatory Visit: Payer: Self-pay

## 2023-01-02 DIAGNOSIS — J9611 Chronic respiratory failure with hypoxia: Secondary | ICD-10-CM | POA: Diagnosis not present

## 2023-01-02 DIAGNOSIS — K219 Gastro-esophageal reflux disease without esophagitis: Secondary | ICD-10-CM | POA: Diagnosis not present

## 2023-01-02 DIAGNOSIS — S066X0D Traumatic subarachnoid hemorrhage without loss of consciousness, subsequent encounter: Secondary | ICD-10-CM | POA: Diagnosis not present

## 2023-01-02 DIAGNOSIS — I2699 Other pulmonary embolism without acute cor pulmonale: Secondary | ICD-10-CM | POA: Diagnosis not present

## 2023-01-02 DIAGNOSIS — Z9981 Dependence on supplemental oxygen: Secondary | ICD-10-CM | POA: Diagnosis not present

## 2023-01-02 DIAGNOSIS — M069 Rheumatoid arthritis, unspecified: Secondary | ICD-10-CM | POA: Diagnosis not present

## 2023-01-02 DIAGNOSIS — Z853 Personal history of malignant neoplasm of breast: Secondary | ICD-10-CM | POA: Diagnosis not present

## 2023-01-02 DIAGNOSIS — H409 Unspecified glaucoma: Secondary | ICD-10-CM | POA: Diagnosis not present

## 2023-01-02 DIAGNOSIS — M810 Age-related osteoporosis without current pathological fracture: Secondary | ICD-10-CM | POA: Diagnosis not present

## 2023-01-02 DIAGNOSIS — R2689 Other abnormalities of gait and mobility: Secondary | ICD-10-CM | POA: Diagnosis not present

## 2023-01-02 DIAGNOSIS — M5000 Cervical disc disorder with myelopathy, unspecified cervical region: Secondary | ICD-10-CM | POA: Diagnosis not present

## 2023-01-02 DIAGNOSIS — I272 Pulmonary hypertension, unspecified: Secondary | ICD-10-CM | POA: Diagnosis not present

## 2023-01-02 DIAGNOSIS — G20C Parkinsonism, unspecified: Secondary | ICD-10-CM | POA: Diagnosis not present

## 2023-01-02 DIAGNOSIS — R296 Repeated falls: Secondary | ICD-10-CM | POA: Diagnosis not present

## 2023-01-02 DIAGNOSIS — D649 Anemia, unspecified: Secondary | ICD-10-CM | POA: Diagnosis not present

## 2023-01-02 DIAGNOSIS — M059 Rheumatoid arthritis with rheumatoid factor, unspecified: Secondary | ICD-10-CM | POA: Diagnosis not present

## 2023-01-02 DIAGNOSIS — M5106 Intervertebral disc disorders with myelopathy, lumbar region: Secondary | ICD-10-CM | POA: Diagnosis not present

## 2023-01-02 DIAGNOSIS — J45909 Unspecified asthma, uncomplicated: Secondary | ICD-10-CM | POA: Diagnosis not present

## 2023-01-02 DIAGNOSIS — I10 Essential (primary) hypertension: Secondary | ICD-10-CM | POA: Diagnosis not present

## 2023-01-03 ENCOUNTER — Encounter (HOSPITAL_COMMUNITY): Payer: Self-pay

## 2023-01-03 ENCOUNTER — Other Ambulatory Visit (HOSPITAL_COMMUNITY): Payer: Self-pay

## 2023-01-04 ENCOUNTER — Other Ambulatory Visit: Payer: Self-pay

## 2023-01-05 ENCOUNTER — Other Ambulatory Visit: Payer: Self-pay | Admitting: Rheumatology

## 2023-01-06 DIAGNOSIS — M5000 Cervical disc disorder with myelopathy, unspecified cervical region: Secondary | ICD-10-CM | POA: Diagnosis not present

## 2023-01-06 DIAGNOSIS — J45909 Unspecified asthma, uncomplicated: Secondary | ICD-10-CM | POA: Diagnosis not present

## 2023-01-06 DIAGNOSIS — G20C Parkinsonism, unspecified: Secondary | ICD-10-CM | POA: Diagnosis not present

## 2023-01-06 DIAGNOSIS — Z9981 Dependence on supplemental oxygen: Secondary | ICD-10-CM | POA: Diagnosis not present

## 2023-01-06 DIAGNOSIS — M810 Age-related osteoporosis without current pathological fracture: Secondary | ICD-10-CM | POA: Diagnosis not present

## 2023-01-06 DIAGNOSIS — I272 Pulmonary hypertension, unspecified: Secondary | ICD-10-CM | POA: Diagnosis not present

## 2023-01-06 DIAGNOSIS — Z853 Personal history of malignant neoplasm of breast: Secondary | ICD-10-CM | POA: Diagnosis not present

## 2023-01-06 DIAGNOSIS — H409 Unspecified glaucoma: Secondary | ICD-10-CM | POA: Diagnosis not present

## 2023-01-06 DIAGNOSIS — I2699 Other pulmonary embolism without acute cor pulmonale: Secondary | ICD-10-CM | POA: Diagnosis not present

## 2023-01-06 DIAGNOSIS — S066X0D Traumatic subarachnoid hemorrhage without loss of consciousness, subsequent encounter: Secondary | ICD-10-CM | POA: Diagnosis not present

## 2023-01-06 DIAGNOSIS — D649 Anemia, unspecified: Secondary | ICD-10-CM | POA: Diagnosis not present

## 2023-01-06 DIAGNOSIS — M5106 Intervertebral disc disorders with myelopathy, lumbar region: Secondary | ICD-10-CM | POA: Diagnosis not present

## 2023-01-06 DIAGNOSIS — I10 Essential (primary) hypertension: Secondary | ICD-10-CM | POA: Diagnosis not present

## 2023-01-06 DIAGNOSIS — K219 Gastro-esophageal reflux disease without esophagitis: Secondary | ICD-10-CM | POA: Diagnosis not present

## 2023-01-06 DIAGNOSIS — M059 Rheumatoid arthritis with rheumatoid factor, unspecified: Secondary | ICD-10-CM | POA: Diagnosis not present

## 2023-01-07 ENCOUNTER — Telehealth: Payer: Self-pay | Admitting: Hematology

## 2023-01-07 ENCOUNTER — Other Ambulatory Visit (HOSPITAL_COMMUNITY): Payer: Self-pay

## 2023-01-07 DIAGNOSIS — J9691 Respiratory failure, unspecified with hypoxia: Secondary | ICD-10-CM | POA: Diagnosis not present

## 2023-01-07 DIAGNOSIS — I2699 Other pulmonary embolism without acute cor pulmonale: Secondary | ICD-10-CM | POA: Diagnosis not present

## 2023-01-07 DIAGNOSIS — R296 Repeated falls: Secondary | ICD-10-CM | POA: Diagnosis not present

## 2023-01-07 DIAGNOSIS — R0902 Hypoxemia: Secondary | ICD-10-CM | POA: Diagnosis not present

## 2023-01-07 DIAGNOSIS — D649 Anemia, unspecified: Secondary | ICD-10-CM | POA: Diagnosis not present

## 2023-01-08 DIAGNOSIS — M5000 Cervical disc disorder with myelopathy, unspecified cervical region: Secondary | ICD-10-CM | POA: Diagnosis not present

## 2023-01-08 DIAGNOSIS — K219 Gastro-esophageal reflux disease without esophagitis: Secondary | ICD-10-CM | POA: Diagnosis not present

## 2023-01-08 DIAGNOSIS — I2699 Other pulmonary embolism without acute cor pulmonale: Secondary | ICD-10-CM | POA: Diagnosis not present

## 2023-01-08 DIAGNOSIS — I10 Essential (primary) hypertension: Secondary | ICD-10-CM | POA: Diagnosis not present

## 2023-01-08 DIAGNOSIS — H409 Unspecified glaucoma: Secondary | ICD-10-CM | POA: Diagnosis not present

## 2023-01-08 DIAGNOSIS — M5106 Intervertebral disc disorders with myelopathy, lumbar region: Secondary | ICD-10-CM | POA: Diagnosis not present

## 2023-01-08 DIAGNOSIS — M059 Rheumatoid arthritis with rheumatoid factor, unspecified: Secondary | ICD-10-CM | POA: Diagnosis not present

## 2023-01-08 DIAGNOSIS — G20C Parkinsonism, unspecified: Secondary | ICD-10-CM | POA: Diagnosis not present

## 2023-01-08 DIAGNOSIS — J45909 Unspecified asthma, uncomplicated: Secondary | ICD-10-CM | POA: Diagnosis not present

## 2023-01-08 DIAGNOSIS — S066X0D Traumatic subarachnoid hemorrhage without loss of consciousness, subsequent encounter: Secondary | ICD-10-CM | POA: Diagnosis not present

## 2023-01-08 DIAGNOSIS — I272 Pulmonary hypertension, unspecified: Secondary | ICD-10-CM | POA: Diagnosis not present

## 2023-01-08 DIAGNOSIS — Z9981 Dependence on supplemental oxygen: Secondary | ICD-10-CM | POA: Diagnosis not present

## 2023-01-08 DIAGNOSIS — M810 Age-related osteoporosis without current pathological fracture: Secondary | ICD-10-CM | POA: Diagnosis not present

## 2023-01-08 DIAGNOSIS — D649 Anemia, unspecified: Secondary | ICD-10-CM | POA: Diagnosis not present

## 2023-01-08 DIAGNOSIS — Z853 Personal history of malignant neoplasm of breast: Secondary | ICD-10-CM | POA: Diagnosis not present

## 2023-01-10 ENCOUNTER — Other Ambulatory Visit (HOSPITAL_COMMUNITY): Payer: Self-pay

## 2023-01-10 DIAGNOSIS — D649 Anemia, unspecified: Secondary | ICD-10-CM | POA: Diagnosis not present

## 2023-01-10 DIAGNOSIS — Z7952 Long term (current) use of systemic steroids: Secondary | ICD-10-CM | POA: Diagnosis not present

## 2023-01-10 DIAGNOSIS — J9611 Chronic respiratory failure with hypoxia: Secondary | ICD-10-CM | POA: Diagnosis not present

## 2023-01-10 DIAGNOSIS — J4540 Moderate persistent asthma, uncomplicated: Secondary | ICD-10-CM | POA: Diagnosis not present

## 2023-01-10 DIAGNOSIS — D849 Immunodeficiency, unspecified: Secondary | ICD-10-CM | POA: Diagnosis not present

## 2023-01-10 DIAGNOSIS — M069 Rheumatoid arthritis, unspecified: Secondary | ICD-10-CM | POA: Diagnosis not present

## 2023-01-11 ENCOUNTER — Other Ambulatory Visit: Payer: Self-pay

## 2023-01-11 DIAGNOSIS — M810 Age-related osteoporosis without current pathological fracture: Secondary | ICD-10-CM | POA: Diagnosis not present

## 2023-01-11 DIAGNOSIS — I272 Pulmonary hypertension, unspecified: Secondary | ICD-10-CM | POA: Diagnosis not present

## 2023-01-11 DIAGNOSIS — S066X0D Traumatic subarachnoid hemorrhage without loss of consciousness, subsequent encounter: Secondary | ICD-10-CM | POA: Diagnosis not present

## 2023-01-11 DIAGNOSIS — I10 Essential (primary) hypertension: Secondary | ICD-10-CM | POA: Diagnosis not present

## 2023-01-11 DIAGNOSIS — H409 Unspecified glaucoma: Secondary | ICD-10-CM | POA: Diagnosis not present

## 2023-01-11 DIAGNOSIS — J45909 Unspecified asthma, uncomplicated: Secondary | ICD-10-CM | POA: Diagnosis not present

## 2023-01-11 DIAGNOSIS — R2689 Other abnormalities of gait and mobility: Secondary | ICD-10-CM | POA: Diagnosis not present

## 2023-01-11 DIAGNOSIS — R296 Repeated falls: Secondary | ICD-10-CM | POA: Diagnosis not present

## 2023-01-11 DIAGNOSIS — G20C Parkinsonism, unspecified: Secondary | ICD-10-CM | POA: Diagnosis not present

## 2023-01-11 DIAGNOSIS — K219 Gastro-esophageal reflux disease without esophagitis: Secondary | ICD-10-CM | POA: Diagnosis not present

## 2023-01-11 DIAGNOSIS — M5106 Intervertebral disc disorders with myelopathy, lumbar region: Secondary | ICD-10-CM | POA: Diagnosis not present

## 2023-01-11 DIAGNOSIS — J9611 Chronic respiratory failure with hypoxia: Secondary | ICD-10-CM | POA: Diagnosis not present

## 2023-01-11 DIAGNOSIS — Z9981 Dependence on supplemental oxygen: Secondary | ICD-10-CM | POA: Diagnosis not present

## 2023-01-11 DIAGNOSIS — M5000 Cervical disc disorder with myelopathy, unspecified cervical region: Secondary | ICD-10-CM | POA: Diagnosis not present

## 2023-01-11 DIAGNOSIS — D649 Anemia, unspecified: Secondary | ICD-10-CM | POA: Diagnosis not present

## 2023-01-11 DIAGNOSIS — M069 Rheumatoid arthritis, unspecified: Secondary | ICD-10-CM | POA: Diagnosis not present

## 2023-01-11 DIAGNOSIS — I2699 Other pulmonary embolism without acute cor pulmonale: Secondary | ICD-10-CM | POA: Diagnosis not present

## 2023-01-11 DIAGNOSIS — Z853 Personal history of malignant neoplasm of breast: Secondary | ICD-10-CM | POA: Diagnosis not present

## 2023-01-11 DIAGNOSIS — M059 Rheumatoid arthritis with rheumatoid factor, unspecified: Secondary | ICD-10-CM | POA: Diagnosis not present

## 2023-01-12 ENCOUNTER — Inpatient Hospital Stay: Payer: Medicare Other | Admitting: Hematology

## 2023-01-12 ENCOUNTER — Other Ambulatory Visit (HOSPITAL_COMMUNITY): Payer: Self-pay

## 2023-01-12 ENCOUNTER — Inpatient Hospital Stay: Payer: Medicare Other

## 2023-01-12 DIAGNOSIS — M810 Age-related osteoporosis without current pathological fracture: Secondary | ICD-10-CM | POA: Diagnosis not present

## 2023-01-12 DIAGNOSIS — Z6824 Body mass index (BMI) 24.0-24.9, adult: Secondary | ICD-10-CM | POA: Diagnosis not present

## 2023-01-13 DIAGNOSIS — H409 Unspecified glaucoma: Secondary | ICD-10-CM | POA: Diagnosis not present

## 2023-01-13 DIAGNOSIS — I2699 Other pulmonary embolism without acute cor pulmonale: Secondary | ICD-10-CM | POA: Diagnosis not present

## 2023-01-13 DIAGNOSIS — K219 Gastro-esophageal reflux disease without esophagitis: Secondary | ICD-10-CM | POA: Diagnosis not present

## 2023-01-13 DIAGNOSIS — Z9981 Dependence on supplemental oxygen: Secondary | ICD-10-CM | POA: Diagnosis not present

## 2023-01-13 DIAGNOSIS — G20C Parkinsonism, unspecified: Secondary | ICD-10-CM | POA: Diagnosis not present

## 2023-01-13 DIAGNOSIS — Z853 Personal history of malignant neoplasm of breast: Secondary | ICD-10-CM | POA: Diagnosis not present

## 2023-01-13 DIAGNOSIS — J45909 Unspecified asthma, uncomplicated: Secondary | ICD-10-CM | POA: Diagnosis not present

## 2023-01-13 DIAGNOSIS — I272 Pulmonary hypertension, unspecified: Secondary | ICD-10-CM | POA: Diagnosis not present

## 2023-01-13 DIAGNOSIS — S066X0D Traumatic subarachnoid hemorrhage without loss of consciousness, subsequent encounter: Secondary | ICD-10-CM | POA: Diagnosis not present

## 2023-01-13 DIAGNOSIS — M5106 Intervertebral disc disorders with myelopathy, lumbar region: Secondary | ICD-10-CM | POA: Diagnosis not present

## 2023-01-13 DIAGNOSIS — D649 Anemia, unspecified: Secondary | ICD-10-CM | POA: Diagnosis not present

## 2023-01-13 DIAGNOSIS — I10 Essential (primary) hypertension: Secondary | ICD-10-CM | POA: Diagnosis not present

## 2023-01-13 DIAGNOSIS — M810 Age-related osteoporosis without current pathological fracture: Secondary | ICD-10-CM | POA: Diagnosis not present

## 2023-01-13 DIAGNOSIS — M059 Rheumatoid arthritis with rheumatoid factor, unspecified: Secondary | ICD-10-CM | POA: Diagnosis not present

## 2023-01-13 DIAGNOSIS — M5000 Cervical disc disorder with myelopathy, unspecified cervical region: Secondary | ICD-10-CM | POA: Diagnosis not present

## 2023-01-14 DIAGNOSIS — M5106 Intervertebral disc disorders with myelopathy, lumbar region: Secondary | ICD-10-CM | POA: Diagnosis not present

## 2023-01-14 DIAGNOSIS — I10 Essential (primary) hypertension: Secondary | ICD-10-CM | POA: Diagnosis not present

## 2023-01-14 DIAGNOSIS — I272 Pulmonary hypertension, unspecified: Secondary | ICD-10-CM | POA: Diagnosis not present

## 2023-01-14 DIAGNOSIS — J45909 Unspecified asthma, uncomplicated: Secondary | ICD-10-CM | POA: Diagnosis not present

## 2023-01-14 DIAGNOSIS — D649 Anemia, unspecified: Secondary | ICD-10-CM | POA: Diagnosis not present

## 2023-01-14 DIAGNOSIS — K219 Gastro-esophageal reflux disease without esophagitis: Secondary | ICD-10-CM | POA: Diagnosis not present

## 2023-01-14 DIAGNOSIS — S066X0D Traumatic subarachnoid hemorrhage without loss of consciousness, subsequent encounter: Secondary | ICD-10-CM | POA: Diagnosis not present

## 2023-01-14 DIAGNOSIS — M5000 Cervical disc disorder with myelopathy, unspecified cervical region: Secondary | ICD-10-CM | POA: Diagnosis not present

## 2023-01-14 DIAGNOSIS — Z853 Personal history of malignant neoplasm of breast: Secondary | ICD-10-CM | POA: Diagnosis not present

## 2023-01-14 DIAGNOSIS — G20C Parkinsonism, unspecified: Secondary | ICD-10-CM | POA: Diagnosis not present

## 2023-01-14 DIAGNOSIS — M810 Age-related osteoporosis without current pathological fracture: Secondary | ICD-10-CM | POA: Diagnosis not present

## 2023-01-14 DIAGNOSIS — Z9981 Dependence on supplemental oxygen: Secondary | ICD-10-CM | POA: Diagnosis not present

## 2023-01-14 DIAGNOSIS — H409 Unspecified glaucoma: Secondary | ICD-10-CM | POA: Diagnosis not present

## 2023-01-14 DIAGNOSIS — I2699 Other pulmonary embolism without acute cor pulmonale: Secondary | ICD-10-CM | POA: Diagnosis not present

## 2023-01-14 DIAGNOSIS — M059 Rheumatoid arthritis with rheumatoid factor, unspecified: Secondary | ICD-10-CM | POA: Diagnosis not present

## 2023-01-17 DIAGNOSIS — J45909 Unspecified asthma, uncomplicated: Secondary | ICD-10-CM | POA: Diagnosis not present

## 2023-01-17 DIAGNOSIS — M069 Rheumatoid arthritis, unspecified: Secondary | ICD-10-CM | POA: Diagnosis not present

## 2023-01-17 DIAGNOSIS — J9611 Chronic respiratory failure with hypoxia: Secondary | ICD-10-CM | POA: Diagnosis not present

## 2023-01-17 DIAGNOSIS — M4802 Spinal stenosis, cervical region: Secondary | ICD-10-CM | POA: Diagnosis not present

## 2023-01-17 DIAGNOSIS — M4804 Spinal stenosis, thoracic region: Secondary | ICD-10-CM | POA: Diagnosis not present

## 2023-01-17 DIAGNOSIS — I10 Essential (primary) hypertension: Secondary | ICD-10-CM | POA: Diagnosis not present

## 2023-01-17 DIAGNOSIS — R296 Repeated falls: Secondary | ICD-10-CM | POA: Diagnosis not present

## 2023-01-17 DIAGNOSIS — M48061 Spinal stenosis, lumbar region without neurogenic claudication: Secondary | ICD-10-CM | POA: Diagnosis not present

## 2023-01-19 DIAGNOSIS — R0602 Shortness of breath: Secondary | ICD-10-CM | POA: Diagnosis not present

## 2023-01-19 DIAGNOSIS — D649 Anemia, unspecified: Secondary | ICD-10-CM | POA: Diagnosis not present

## 2023-01-19 DIAGNOSIS — K219 Gastro-esophageal reflux disease without esophagitis: Secondary | ICD-10-CM | POA: Diagnosis not present

## 2023-01-19 DIAGNOSIS — M5106 Intervertebral disc disorders with myelopathy, lumbar region: Secondary | ICD-10-CM | POA: Diagnosis not present

## 2023-01-19 DIAGNOSIS — I10 Essential (primary) hypertension: Secondary | ICD-10-CM | POA: Diagnosis not present

## 2023-01-19 DIAGNOSIS — Z853 Personal history of malignant neoplasm of breast: Secondary | ICD-10-CM | POA: Diagnosis not present

## 2023-01-19 DIAGNOSIS — H409 Unspecified glaucoma: Secondary | ICD-10-CM | POA: Diagnosis not present

## 2023-01-19 DIAGNOSIS — M059 Rheumatoid arthritis with rheumatoid factor, unspecified: Secondary | ICD-10-CM | POA: Diagnosis not present

## 2023-01-19 DIAGNOSIS — M5000 Cervical disc disorder with myelopathy, unspecified cervical region: Secondary | ICD-10-CM | POA: Diagnosis not present

## 2023-01-19 DIAGNOSIS — J45909 Unspecified asthma, uncomplicated: Secondary | ICD-10-CM | POA: Diagnosis not present

## 2023-01-19 DIAGNOSIS — M810 Age-related osteoporosis without current pathological fracture: Secondary | ICD-10-CM | POA: Diagnosis not present

## 2023-01-19 DIAGNOSIS — M6281 Muscle weakness (generalized): Secondary | ICD-10-CM | POA: Diagnosis not present

## 2023-01-19 DIAGNOSIS — Z9981 Dependence on supplemental oxygen: Secondary | ICD-10-CM | POA: Diagnosis not present

## 2023-01-19 DIAGNOSIS — I2699 Other pulmonary embolism without acute cor pulmonale: Secondary | ICD-10-CM | POA: Diagnosis not present

## 2023-01-19 DIAGNOSIS — G20C Parkinsonism, unspecified: Secondary | ICD-10-CM | POA: Diagnosis not present

## 2023-01-19 DIAGNOSIS — S066X0D Traumatic subarachnoid hemorrhage without loss of consciousness, subsequent encounter: Secondary | ICD-10-CM | POA: Diagnosis not present

## 2023-01-19 DIAGNOSIS — I272 Pulmonary hypertension, unspecified: Secondary | ICD-10-CM | POA: Diagnosis not present

## 2023-01-19 DIAGNOSIS — M6259 Muscle wasting and atrophy, not elsewhere classified, multiple sites: Secondary | ICD-10-CM | POA: Diagnosis not present

## 2023-01-20 DIAGNOSIS — M5106 Intervertebral disc disorders with myelopathy, lumbar region: Secondary | ICD-10-CM | POA: Diagnosis not present

## 2023-01-20 DIAGNOSIS — G20C Parkinsonism, unspecified: Secondary | ICD-10-CM | POA: Diagnosis not present

## 2023-01-20 DIAGNOSIS — J45909 Unspecified asthma, uncomplicated: Secondary | ICD-10-CM | POA: Diagnosis not present

## 2023-01-20 DIAGNOSIS — Z9981 Dependence on supplemental oxygen: Secondary | ICD-10-CM | POA: Diagnosis not present

## 2023-01-20 DIAGNOSIS — S066X0D Traumatic subarachnoid hemorrhage without loss of consciousness, subsequent encounter: Secondary | ICD-10-CM | POA: Diagnosis not present

## 2023-01-20 DIAGNOSIS — D649 Anemia, unspecified: Secondary | ICD-10-CM | POA: Diagnosis not present

## 2023-01-20 DIAGNOSIS — M059 Rheumatoid arthritis with rheumatoid factor, unspecified: Secondary | ICD-10-CM | POA: Diagnosis not present

## 2023-01-20 DIAGNOSIS — K219 Gastro-esophageal reflux disease without esophagitis: Secondary | ICD-10-CM | POA: Diagnosis not present

## 2023-01-20 DIAGNOSIS — I272 Pulmonary hypertension, unspecified: Secondary | ICD-10-CM | POA: Diagnosis not present

## 2023-01-20 DIAGNOSIS — H409 Unspecified glaucoma: Secondary | ICD-10-CM | POA: Diagnosis not present

## 2023-01-20 DIAGNOSIS — M5000 Cervical disc disorder with myelopathy, unspecified cervical region: Secondary | ICD-10-CM | POA: Diagnosis not present

## 2023-01-20 DIAGNOSIS — I10 Essential (primary) hypertension: Secondary | ICD-10-CM | POA: Diagnosis not present

## 2023-01-20 DIAGNOSIS — I2699 Other pulmonary embolism without acute cor pulmonale: Secondary | ICD-10-CM | POA: Diagnosis not present

## 2023-01-20 DIAGNOSIS — M810 Age-related osteoporosis without current pathological fracture: Secondary | ICD-10-CM | POA: Diagnosis not present

## 2023-01-20 DIAGNOSIS — Z853 Personal history of malignant neoplasm of breast: Secondary | ICD-10-CM | POA: Diagnosis not present

## 2023-01-21 DIAGNOSIS — S066X0D Traumatic subarachnoid hemorrhage without loss of consciousness, subsequent encounter: Secondary | ICD-10-CM | POA: Diagnosis not present

## 2023-01-21 DIAGNOSIS — Z9981 Dependence on supplemental oxygen: Secondary | ICD-10-CM | POA: Diagnosis not present

## 2023-01-21 DIAGNOSIS — H409 Unspecified glaucoma: Secondary | ICD-10-CM | POA: Diagnosis not present

## 2023-01-21 DIAGNOSIS — M059 Rheumatoid arthritis with rheumatoid factor, unspecified: Secondary | ICD-10-CM | POA: Diagnosis not present

## 2023-01-21 DIAGNOSIS — M5106 Intervertebral disc disorders with myelopathy, lumbar region: Secondary | ICD-10-CM | POA: Diagnosis not present

## 2023-01-21 DIAGNOSIS — I2699 Other pulmonary embolism without acute cor pulmonale: Secondary | ICD-10-CM | POA: Diagnosis not present

## 2023-01-21 DIAGNOSIS — J45909 Unspecified asthma, uncomplicated: Secondary | ICD-10-CM | POA: Diagnosis not present

## 2023-01-21 DIAGNOSIS — G20C Parkinsonism, unspecified: Secondary | ICD-10-CM | POA: Diagnosis not present

## 2023-01-21 DIAGNOSIS — K219 Gastro-esophageal reflux disease without esophagitis: Secondary | ICD-10-CM | POA: Diagnosis not present

## 2023-01-21 DIAGNOSIS — D649 Anemia, unspecified: Secondary | ICD-10-CM | POA: Diagnosis not present

## 2023-01-21 DIAGNOSIS — M5000 Cervical disc disorder with myelopathy, unspecified cervical region: Secondary | ICD-10-CM | POA: Diagnosis not present

## 2023-01-21 DIAGNOSIS — M810 Age-related osteoporosis without current pathological fracture: Secondary | ICD-10-CM | POA: Diagnosis not present

## 2023-01-21 DIAGNOSIS — I272 Pulmonary hypertension, unspecified: Secondary | ICD-10-CM | POA: Diagnosis not present

## 2023-01-21 DIAGNOSIS — Z853 Personal history of malignant neoplasm of breast: Secondary | ICD-10-CM | POA: Diagnosis not present

## 2023-01-21 DIAGNOSIS — I10 Essential (primary) hypertension: Secondary | ICD-10-CM | POA: Diagnosis not present

## 2023-01-24 DIAGNOSIS — M6259 Muscle wasting and atrophy, not elsewhere classified, multiple sites: Secondary | ICD-10-CM | POA: Diagnosis not present

## 2023-01-24 DIAGNOSIS — M6281 Muscle weakness (generalized): Secondary | ICD-10-CM | POA: Diagnosis not present

## 2023-01-25 DIAGNOSIS — S066X4A Traumatic subarachnoid hemorrhage with loss of consciousness of 6 hours to 24 hours, initial encounter: Secondary | ICD-10-CM | POA: Diagnosis not present

## 2023-01-25 DIAGNOSIS — J45909 Unspecified asthma, uncomplicated: Secondary | ICD-10-CM | POA: Diagnosis not present

## 2023-01-26 DIAGNOSIS — I2699 Other pulmonary embolism without acute cor pulmonale: Secondary | ICD-10-CM | POA: Diagnosis not present

## 2023-01-26 DIAGNOSIS — M5106 Intervertebral disc disorders with myelopathy, lumbar region: Secondary | ICD-10-CM | POA: Diagnosis not present

## 2023-01-26 DIAGNOSIS — G20C Parkinsonism, unspecified: Secondary | ICD-10-CM | POA: Diagnosis not present

## 2023-01-26 DIAGNOSIS — H409 Unspecified glaucoma: Secondary | ICD-10-CM | POA: Diagnosis not present

## 2023-01-26 DIAGNOSIS — M545 Low back pain, unspecified: Secondary | ICD-10-CM | POA: Diagnosis not present

## 2023-01-26 DIAGNOSIS — M25522 Pain in left elbow: Secondary | ICD-10-CM | POA: Diagnosis not present

## 2023-01-26 DIAGNOSIS — I272 Pulmonary hypertension, unspecified: Secondary | ICD-10-CM | POA: Diagnosis not present

## 2023-01-26 DIAGNOSIS — M25551 Pain in right hip: Secondary | ICD-10-CM | POA: Diagnosis not present

## 2023-01-26 DIAGNOSIS — K219 Gastro-esophageal reflux disease without esophagitis: Secondary | ICD-10-CM | POA: Diagnosis not present

## 2023-01-26 DIAGNOSIS — S066X0D Traumatic subarachnoid hemorrhage without loss of consciousness, subsequent encounter: Secondary | ICD-10-CM | POA: Diagnosis not present

## 2023-01-26 DIAGNOSIS — M810 Age-related osteoporosis without current pathological fracture: Secondary | ICD-10-CM | POA: Diagnosis not present

## 2023-01-26 DIAGNOSIS — M79632 Pain in left forearm: Secondary | ICD-10-CM | POA: Diagnosis not present

## 2023-01-26 DIAGNOSIS — Z9981 Dependence on supplemental oxygen: Secondary | ICD-10-CM | POA: Diagnosis not present

## 2023-01-26 DIAGNOSIS — D649 Anemia, unspecified: Secondary | ICD-10-CM | POA: Diagnosis not present

## 2023-01-26 DIAGNOSIS — J45909 Unspecified asthma, uncomplicated: Secondary | ICD-10-CM | POA: Diagnosis not present

## 2023-01-26 DIAGNOSIS — M5000 Cervical disc disorder with myelopathy, unspecified cervical region: Secondary | ICD-10-CM | POA: Diagnosis not present

## 2023-01-26 DIAGNOSIS — M059 Rheumatoid arthritis with rheumatoid factor, unspecified: Secondary | ICD-10-CM | POA: Diagnosis not present

## 2023-01-26 DIAGNOSIS — M25512 Pain in left shoulder: Secondary | ICD-10-CM | POA: Diagnosis not present

## 2023-01-26 DIAGNOSIS — I10 Essential (primary) hypertension: Secondary | ICD-10-CM | POA: Diagnosis not present

## 2023-01-26 DIAGNOSIS — M79602 Pain in left arm: Secondary | ICD-10-CM | POA: Diagnosis not present

## 2023-01-26 DIAGNOSIS — M25532 Pain in left wrist: Secondary | ICD-10-CM | POA: Diagnosis not present

## 2023-01-26 DIAGNOSIS — R296 Repeated falls: Secondary | ICD-10-CM | POA: Diagnosis not present

## 2023-01-26 DIAGNOSIS — Z853 Personal history of malignant neoplasm of breast: Secondary | ICD-10-CM | POA: Diagnosis not present

## 2023-01-26 DIAGNOSIS — M069 Rheumatoid arthritis, unspecified: Secondary | ICD-10-CM | POA: Diagnosis not present

## 2023-01-28 DIAGNOSIS — D649 Anemia, unspecified: Secondary | ICD-10-CM | POA: Diagnosis not present

## 2023-01-28 DIAGNOSIS — Z96611 Presence of right artificial shoulder joint: Secondary | ICD-10-CM | POA: Diagnosis not present

## 2023-01-28 DIAGNOSIS — M5106 Intervertebral disc disorders with myelopathy, lumbar region: Secondary | ICD-10-CM | POA: Diagnosis not present

## 2023-01-28 DIAGNOSIS — Z853 Personal history of malignant neoplasm of breast: Secondary | ICD-10-CM | POA: Diagnosis not present

## 2023-01-28 DIAGNOSIS — I272 Pulmonary hypertension, unspecified: Secondary | ICD-10-CM | POA: Diagnosis not present

## 2023-01-28 DIAGNOSIS — G20C Parkinsonism, unspecified: Secondary | ICD-10-CM | POA: Diagnosis not present

## 2023-01-28 DIAGNOSIS — K219 Gastro-esophageal reflux disease without esophagitis: Secondary | ICD-10-CM | POA: Diagnosis not present

## 2023-01-28 DIAGNOSIS — J45909 Unspecified asthma, uncomplicated: Secondary | ICD-10-CM | POA: Diagnosis not present

## 2023-01-28 DIAGNOSIS — J4540 Moderate persistent asthma, uncomplicated: Secondary | ICD-10-CM | POA: Diagnosis not present

## 2023-01-28 DIAGNOSIS — H409 Unspecified glaucoma: Secondary | ICD-10-CM | POA: Diagnosis not present

## 2023-01-28 DIAGNOSIS — Z7952 Long term (current) use of systemic steroids: Secondary | ICD-10-CM | POA: Diagnosis not present

## 2023-01-28 DIAGNOSIS — M059 Rheumatoid arthritis with rheumatoid factor, unspecified: Secondary | ICD-10-CM | POA: Diagnosis not present

## 2023-01-28 DIAGNOSIS — D849 Immunodeficiency, unspecified: Secondary | ICD-10-CM | POA: Diagnosis not present

## 2023-01-28 DIAGNOSIS — M810 Age-related osteoporosis without current pathological fracture: Secondary | ICD-10-CM | POA: Diagnosis not present

## 2023-01-28 DIAGNOSIS — M069 Rheumatoid arthritis, unspecified: Secondary | ICD-10-CM | POA: Diagnosis not present

## 2023-01-28 DIAGNOSIS — I10 Essential (primary) hypertension: Secondary | ICD-10-CM | POA: Diagnosis not present

## 2023-01-28 DIAGNOSIS — M5000 Cervical disc disorder with myelopathy, unspecified cervical region: Secondary | ICD-10-CM | POA: Diagnosis not present

## 2023-01-28 DIAGNOSIS — I2699 Other pulmonary embolism without acute cor pulmonale: Secondary | ICD-10-CM | POA: Diagnosis not present

## 2023-01-28 DIAGNOSIS — I517 Cardiomegaly: Secondary | ICD-10-CM | POA: Diagnosis not present

## 2023-01-28 DIAGNOSIS — S066X0D Traumatic subarachnoid hemorrhage without loss of consciousness, subsequent encounter: Secondary | ICD-10-CM | POA: Diagnosis not present

## 2023-01-28 DIAGNOSIS — J9611 Chronic respiratory failure with hypoxia: Secondary | ICD-10-CM | POA: Diagnosis not present

## 2023-01-28 DIAGNOSIS — Z9981 Dependence on supplemental oxygen: Secondary | ICD-10-CM | POA: Diagnosis not present

## 2023-01-28 DIAGNOSIS — J90 Pleural effusion, not elsewhere classified: Secondary | ICD-10-CM | POA: Diagnosis not present

## 2023-01-29 DIAGNOSIS — R0902 Hypoxemia: Secondary | ICD-10-CM | POA: Diagnosis not present

## 2023-01-29 DIAGNOSIS — D649 Anemia, unspecified: Secondary | ICD-10-CM | POA: Diagnosis not present

## 2023-01-29 DIAGNOSIS — I2699 Other pulmonary embolism without acute cor pulmonale: Secondary | ICD-10-CM | POA: Diagnosis not present

## 2023-01-29 DIAGNOSIS — R296 Repeated falls: Secondary | ICD-10-CM | POA: Diagnosis not present

## 2023-01-29 DIAGNOSIS — J9691 Respiratory failure, unspecified with hypoxia: Secondary | ICD-10-CM | POA: Diagnosis not present

## 2023-02-02 ENCOUNTER — Other Ambulatory Visit (HOSPITAL_COMMUNITY): Payer: Self-pay

## 2023-02-02 ENCOUNTER — Other Ambulatory Visit: Payer: Self-pay | Admitting: Physician Assistant

## 2023-02-02 DIAGNOSIS — M0579 Rheumatoid arthritis with rheumatoid factor of multiple sites without organ or systems involvement: Secondary | ICD-10-CM

## 2023-02-10 ENCOUNTER — Other Ambulatory Visit (HOSPITAL_COMMUNITY): Payer: Self-pay

## 2023-04-13 ENCOUNTER — Other Ambulatory Visit: Payer: Medicare Other

## 2023-04-13 ENCOUNTER — Ambulatory Visit: Payer: Medicare Other

## 2023-04-13 ENCOUNTER — Ambulatory Visit: Payer: Medicare Other | Admitting: Hematology

## 2023-05-01 DEATH — deceased

## 2023-05-22 IMAGING — CT CT HEAD W/O CM
1 series · 15 of 30 positions shown, 19 images · non-contrast
Comparison: None.

CLINICAL DATA: Previous fall



[Series 2: head w/(date) · axial · 0.49mm/px · z∈[+169,+329]mm · 15 of 36 slices shown, 19 images]
[im 2/36  brain]
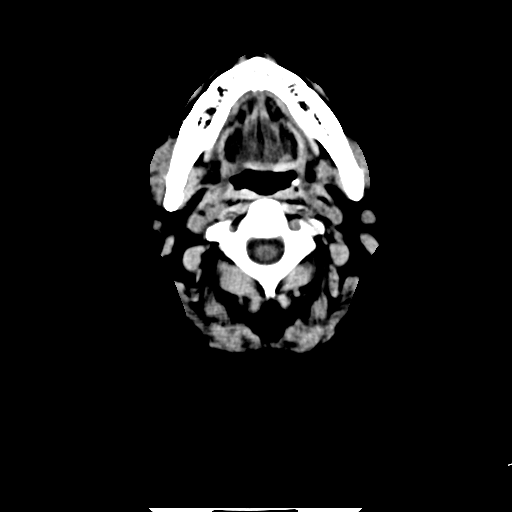
[im 2/36  bone]
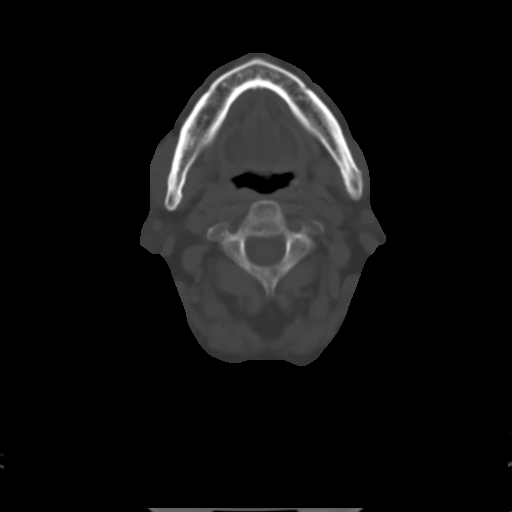
[im 4/36  brain]
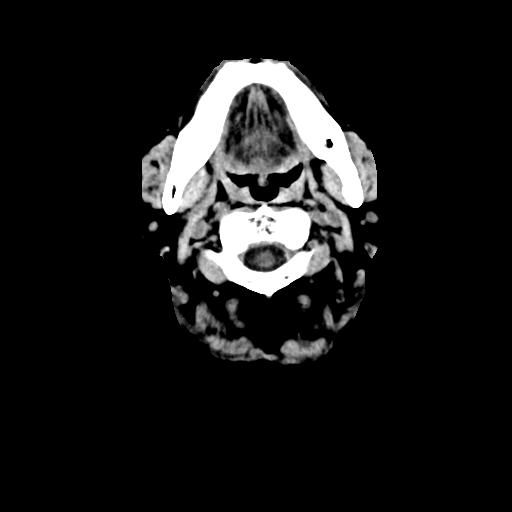
[im 7/36  brain]
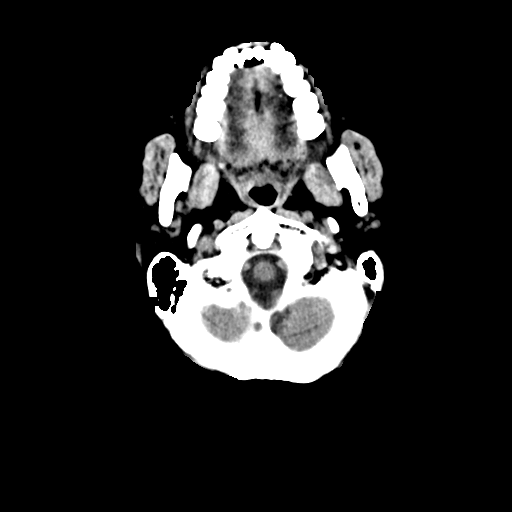
[im 9/36  brain]
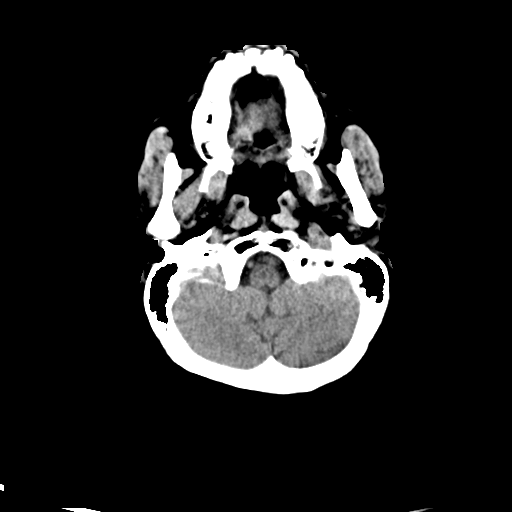
[im 11/36  brain]
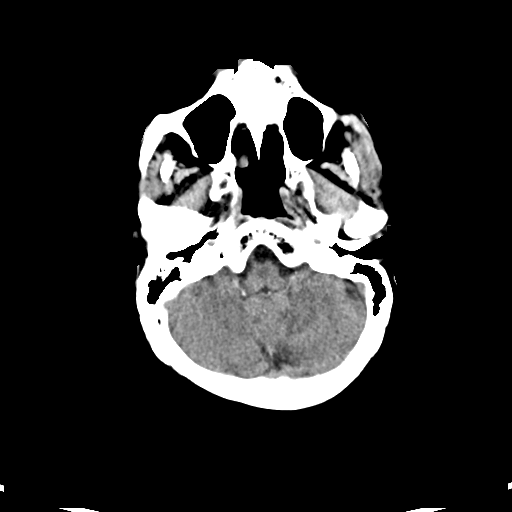
[im 11/36  bone]
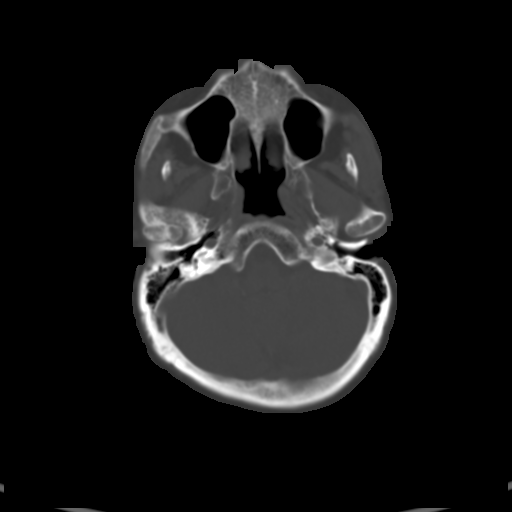
[im 14/36  brain]
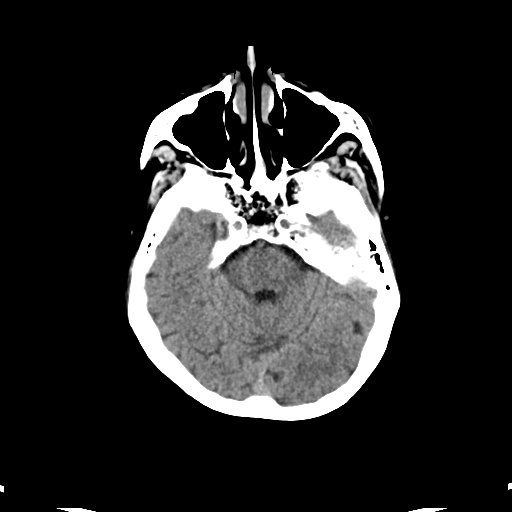
[im 16/36  brain]
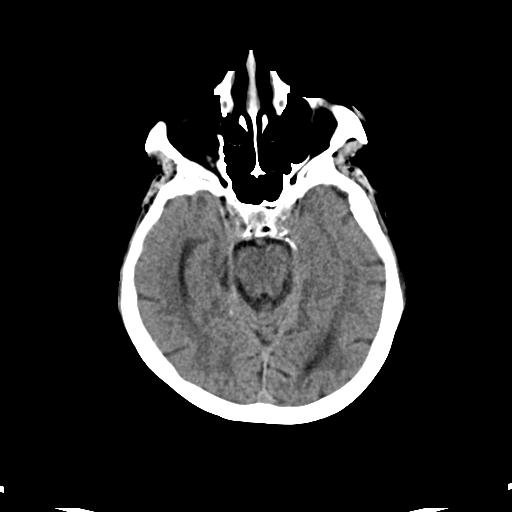
[im 19/36  brain]
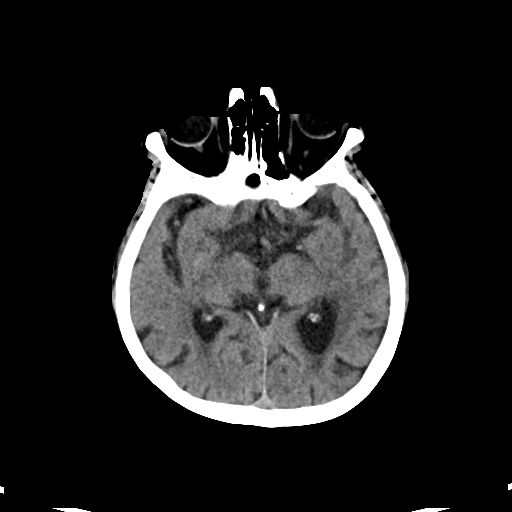
[im 20/36  brain]
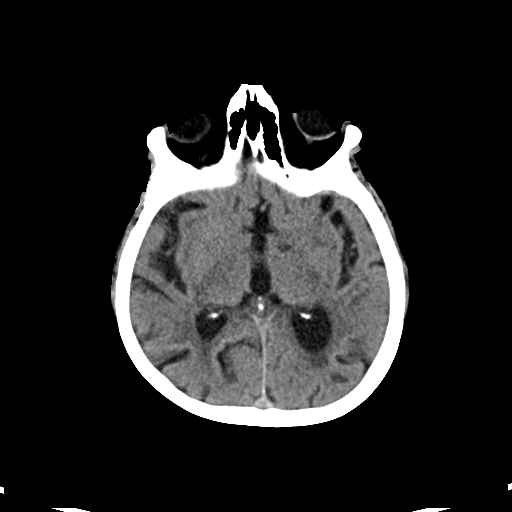
[im 20/36  bone]
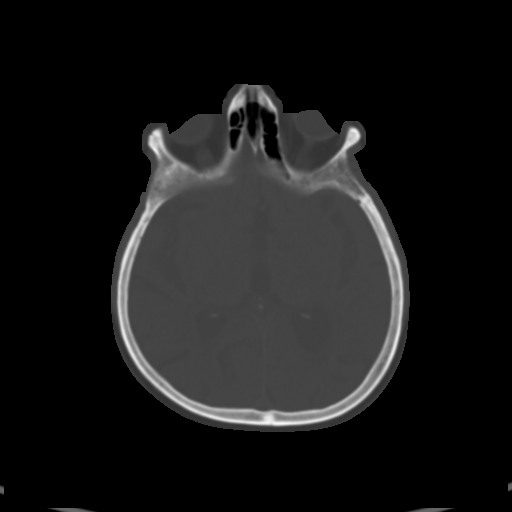
[im 22/36  brain]
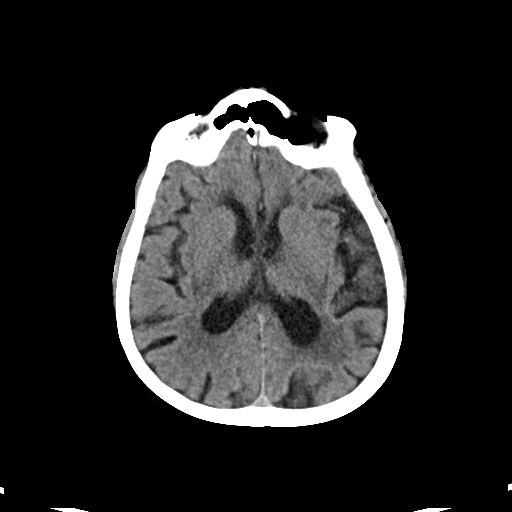
[im 25/36  brain]
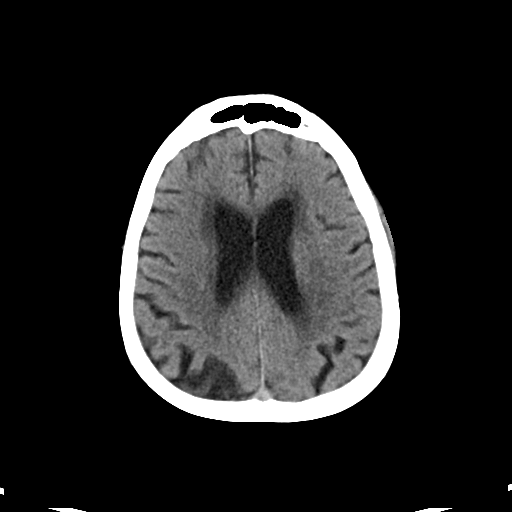
[im 27/36  brain]
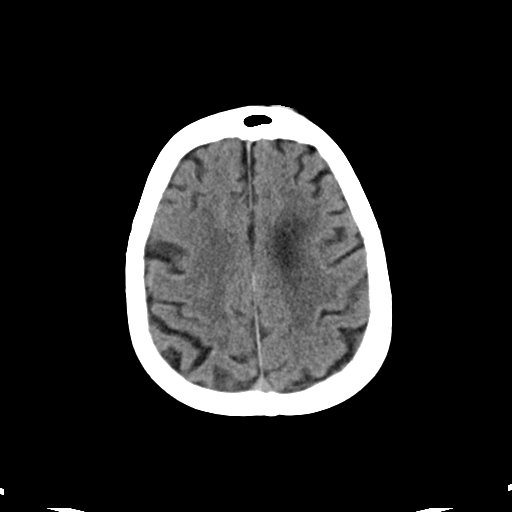
[im 29/36  brain]
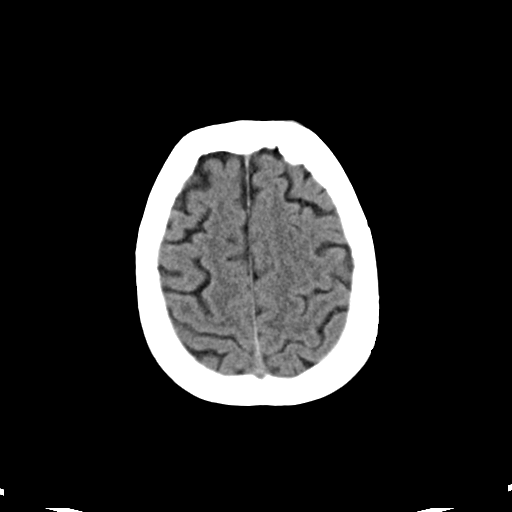
[im 29/36  bone]
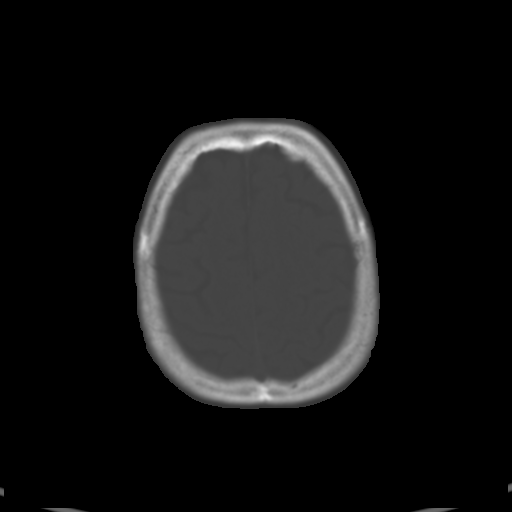
[im 32/36  brain]
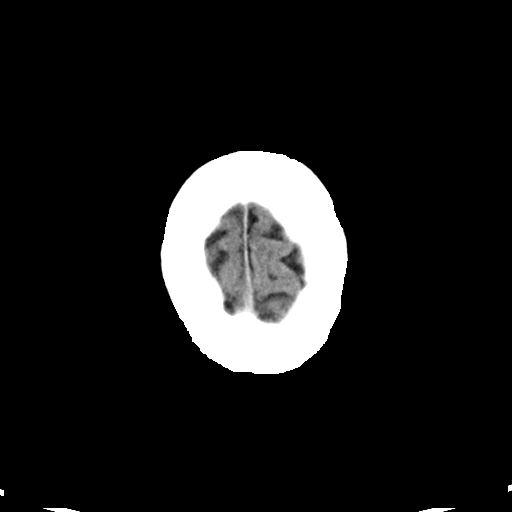
[im 34/36  brain]
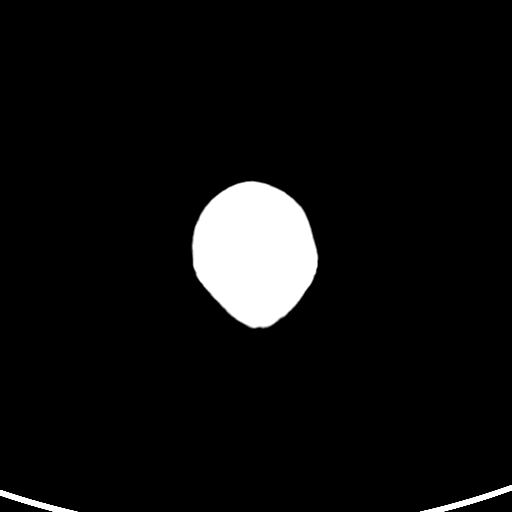

[15 of 30 positions shown; findings below may reference images not displayed]

FINDINGS: Brain: No acute intracranial findings are seen. There are no
epidural or subdural fluid collections. Cortical sulci are
prominent. There is decreased density in the left posterior parietal
cortex. There is decreased density in the periventricular white
matter.

Vascular: Unremarkable.

Skull: No fracture is seen in the calvarium. There is subcutaneous
contusion/hematoma in the left frontal scalp.

Sinuses/Orbits: Unremarkable.

Other: None
IMPRESSION: No acute intracranial findings are seen in noncontrast CT brain.
There is decreased density in the left posterior parietal cortex,
possibly encephalomalacia from previous infarction. Atrophy.
Small-vessel disease.

## 2023-06-09 ENCOUNTER — Telehealth: Payer: Self-pay | Admitting: Hematology

## 2023-06-09 NOTE — Telephone Encounter (Signed)
 Rescheduled appointments per provider on call. Patient has no voice mail and the other number is out of service. Patient will be mailed an appointment reminder.

## 2023-07-13 ENCOUNTER — Other Ambulatory Visit: Payer: Medicare Other

## 2023-07-13 ENCOUNTER — Ambulatory Visit: Payer: Medicare Other | Admitting: Hematology

## 2023-07-13 ENCOUNTER — Ambulatory Visit: Payer: Medicare Other

## 2023-07-15 ENCOUNTER — Ambulatory Visit: Payer: Medicare Other

## 2023-07-15 ENCOUNTER — Other Ambulatory Visit: Payer: Medicare Other

## 2023-07-15 ENCOUNTER — Ambulatory Visit: Payer: Medicare Other | Admitting: Hematology
